# Patient Record
Sex: Female | Born: 1955 | Race: White | Hispanic: No | Marital: Single | Smoking: Never smoker
Health system: Southern US, Community
[De-identification: ages and names within clinical notes are randomized; demographics above are authoritative.]

## PROBLEM LIST (undated history)

## (undated) DIAGNOSIS — E785 Hyperlipidemia, unspecified: Secondary | ICD-10-CM

## (undated) DIAGNOSIS — Z9889 Other specified postprocedural states: Secondary | ICD-10-CM

## (undated) DIAGNOSIS — R7303 Prediabetes: Secondary | ICD-10-CM

## (undated) DIAGNOSIS — R7301 Impaired fasting glucose: Secondary | ICD-10-CM

## (undated) DIAGNOSIS — K5190 Ulcerative colitis, unspecified, without complications: Secondary | ICD-10-CM

## (undated) DIAGNOSIS — K5730 Diverticulosis of large intestine without perforation or abscess without bleeding: Secondary | ICD-10-CM

## (undated) DIAGNOSIS — R112 Nausea with vomiting, unspecified: Secondary | ICD-10-CM

## (undated) DIAGNOSIS — M1990 Unspecified osteoarthritis, unspecified site: Secondary | ICD-10-CM

## (undated) HISTORY — DX: Impaired fasting glucose: R73.01

## (undated) HISTORY — PX: CARPAL TUNNEL RELEASE: SHX101

## (undated) HISTORY — PX: BREAST ENHANCEMENT SURGERY: SHX7

## (undated) HISTORY — PX: COLONOSCOPY: SHX174

## (undated) HISTORY — DX: Hyperlipidemia, unspecified: E78.5

## (undated) HISTORY — DX: Diverticulosis of large intestine without perforation or abscess without bleeding: K57.30

---

## 1999-02-08 ENCOUNTER — Other Ambulatory Visit: Admission: RE | Admit: 1999-02-08 | Discharge: 1999-02-08 | Payer: Self-pay | Admitting: *Deleted

## 2000-02-07 ENCOUNTER — Encounter: Payer: Self-pay | Admitting: *Deleted

## 2000-02-07 ENCOUNTER — Encounter: Admission: RE | Admit: 2000-02-07 | Discharge: 2000-02-07 | Payer: Self-pay | Admitting: *Deleted

## 2000-02-14 ENCOUNTER — Other Ambulatory Visit: Admission: RE | Admit: 2000-02-14 | Discharge: 2000-02-14 | Payer: Self-pay | Admitting: *Deleted

## 2001-03-26 ENCOUNTER — Other Ambulatory Visit: Admission: RE | Admit: 2001-03-26 | Discharge: 2001-03-26 | Payer: Self-pay | Admitting: Obstetrics and Gynecology

## 2001-04-02 ENCOUNTER — Encounter: Payer: Self-pay | Admitting: Obstetrics and Gynecology

## 2001-04-02 ENCOUNTER — Encounter: Admission: RE | Admit: 2001-04-02 | Discharge: 2001-04-02 | Payer: Self-pay | Admitting: Obstetrics and Gynecology

## 2002-04-08 ENCOUNTER — Encounter: Admission: RE | Admit: 2002-04-08 | Discharge: 2002-04-08 | Payer: Self-pay | Admitting: Obstetrics and Gynecology

## 2002-04-08 ENCOUNTER — Encounter: Payer: Self-pay | Admitting: Obstetrics and Gynecology

## 2002-04-29 ENCOUNTER — Other Ambulatory Visit: Admission: RE | Admit: 2002-04-29 | Discharge: 2002-04-29 | Payer: Self-pay | Admitting: Obstetrics and Gynecology

## 2003-04-14 ENCOUNTER — Encounter: Payer: Self-pay | Admitting: Obstetrics and Gynecology

## 2003-04-14 ENCOUNTER — Encounter: Admission: RE | Admit: 2003-04-14 | Discharge: 2003-04-14 | Payer: Self-pay | Admitting: Obstetrics and Gynecology

## 2003-05-19 ENCOUNTER — Other Ambulatory Visit: Admission: RE | Admit: 2003-05-19 | Discharge: 2003-05-19 | Payer: Self-pay | Admitting: Obstetrics and Gynecology

## 2004-06-14 ENCOUNTER — Other Ambulatory Visit: Admission: RE | Admit: 2004-06-14 | Discharge: 2004-06-14 | Payer: Self-pay | Admitting: Obstetrics and Gynecology

## 2004-06-21 ENCOUNTER — Encounter: Admission: RE | Admit: 2004-06-21 | Discharge: 2004-06-21 | Payer: Self-pay | Admitting: Obstetrics and Gynecology

## 2005-07-18 ENCOUNTER — Encounter: Admission: RE | Admit: 2005-07-18 | Discharge: 2005-07-18 | Payer: Self-pay | Admitting: Obstetrics and Gynecology

## 2006-09-11 ENCOUNTER — Encounter: Admission: RE | Admit: 2006-09-11 | Discharge: 2006-09-11 | Payer: Self-pay | Admitting: Obstetrics and Gynecology

## 2007-09-17 ENCOUNTER — Encounter: Admission: RE | Admit: 2007-09-17 | Discharge: 2007-09-17 | Payer: Self-pay | Admitting: Obstetrics and Gynecology

## 2009-08-10 ENCOUNTER — Encounter: Admission: RE | Admit: 2009-08-10 | Discharge: 2009-08-10 | Payer: Self-pay | Admitting: Family Medicine

## 2010-04-21 DIAGNOSIS — K5730 Diverticulosis of large intestine without perforation or abscess without bleeding: Secondary | ICD-10-CM

## 2010-04-21 HISTORY — DX: Diverticulosis of large intestine without perforation or abscess without bleeding: K57.30

## 2010-05-17 ENCOUNTER — Encounter: Admission: RE | Admit: 2010-05-17 | Discharge: 2010-05-17 | Payer: Self-pay | Admitting: Family Medicine

## 2010-11-08 ENCOUNTER — Encounter
Admission: RE | Admit: 2010-11-08 | Discharge: 2010-11-08 | Payer: Self-pay | Source: Home / Self Care | Attending: Family Medicine | Admitting: Family Medicine

## 2010-12-12 ENCOUNTER — Encounter: Payer: Self-pay | Admitting: Family Medicine

## 2012-02-13 ENCOUNTER — Other Ambulatory Visit: Payer: Self-pay | Admitting: Family Medicine

## 2012-02-13 DIAGNOSIS — Z1231 Encounter for screening mammogram for malignant neoplasm of breast: Secondary | ICD-10-CM

## 2012-02-20 ENCOUNTER — Ambulatory Visit
Admission: RE | Admit: 2012-02-20 | Discharge: 2012-02-20 | Disposition: A | Discharge status: Home / Self Care | Payer: BC Managed Care – PPO | Source: Ambulatory Visit | Attending: Family Medicine | Admitting: Family Medicine

## 2012-02-20 DIAGNOSIS — Z1231 Encounter for screening mammogram for malignant neoplasm of breast: Secondary | ICD-10-CM

## 2013-04-29 ENCOUNTER — Other Ambulatory Visit: Payer: Self-pay

## 2013-04-29 DIAGNOSIS — Z1231 Encounter for screening mammogram for malignant neoplasm of breast: Secondary | ICD-10-CM

## 2013-05-27 ENCOUNTER — Ambulatory Visit
Admission: RE | Admit: 2013-05-27 | Discharge: 2013-05-27 | Disposition: A | Discharge status: Home / Self Care | Payer: BC Managed Care – PPO | Source: Ambulatory Visit

## 2013-05-27 DIAGNOSIS — Z1231 Encounter for screening mammogram for malignant neoplasm of breast: Secondary | ICD-10-CM

## 2013-09-30 ENCOUNTER — Encounter: Payer: Self-pay | Admitting: Family Medicine

## 2013-09-30 ENCOUNTER — Ambulatory Visit (INDEPENDENT_AMBULATORY_CARE_PROVIDER_SITE_OTHER): Payer: BC Managed Care – PPO | Admitting: Family Medicine

## 2013-09-30 ENCOUNTER — Other Ambulatory Visit: Payer: Self-pay | Admitting: Family Medicine

## 2013-09-30 VITALS — BP 145/82 | HR 72 | Temp 98.2°F | Resp 18 | Ht 63.0 in | Wt 175.0 lb

## 2013-09-30 DIAGNOSIS — E785 Hyperlipidemia, unspecified: Secondary | ICD-10-CM | POA: Insufficient documentation

## 2013-09-30 DIAGNOSIS — Z23 Encounter for immunization: Secondary | ICD-10-CM

## 2013-09-30 DIAGNOSIS — Z0000 Encounter for general adult medical examination without abnormal findings: Secondary | ICD-10-CM

## 2013-09-30 LAB — COMPREHENSIVE METABOLIC PANEL
ALT: 20 U/L (ref 0–35)
Albumin: 4.2 g/dL (ref 3.5–5.2)
Alkaline Phosphatase: 64 U/L (ref 39–117)
CO2: 28 mEq/L (ref 19–32)
Calcium: 9.3 mg/dL (ref 8.4–10.5)
Chloride: 100 mEq/L (ref 96–112)
Potassium: 4.6 mEq/L (ref 3.5–5.3)
Sodium: 135 mEq/L (ref 135–145)

## 2013-09-30 LAB — CBC WITH DIFFERENTIAL/PLATELET
Eosinophils Absolute: 0.3 10*3/uL (ref 0.0–0.7)
Hemoglobin: 14.4 g/dL (ref 12.0–15.0)
Lymphs Abs: 3.2 10*3/uL (ref 0.7–4.0)
MCH: 30.4 pg (ref 26.0–34.0)
MCHC: 34.6 g/dL (ref 30.0–36.0)
Monocytes Absolute: 0.8 10*3/uL (ref 0.1–1.0)
Monocytes Relative: 7 % (ref 3–12)
Neutrophils Relative %: 61 % (ref 43–77)
RBC: 4.74 MIL/uL (ref 3.87–5.11)

## 2013-09-30 LAB — LIPID PANEL
Cholesterol: 223 mg/dL — ABNORMAL HIGH (ref 0–200)
LDL Cholesterol: 147 mg/dL — ABNORMAL HIGH (ref 0–99)
Total CHOL/HDL Ratio: 5 Ratio
Triglycerides: 155 mg/dL — ABNORMAL HIGH (ref <150)

## 2013-09-30 NOTE — Progress Notes (Signed)
Office Note 09/30/2013  CC:  Chief Complaint  Patient presents with  . Establish Care    HPI:  Nancy Eaton is a 57 y.o. White female who is here to establish care. Patient's most recent primary MD: Dr. Prince Rome (has moved to [place]). Old records radiology in [facility] were reviewed prior to or during today's visit.  Feeling well today, no acute complaints.  She has fasted x 8hrs for her labs., needs mevacor renewed--has been out x 1 wk.  Past Medical History  Diagnosis Date  . Sigmoid diverticulosis 04/2010    "     "     "    "  . Hyperlipidemia     Lovastatin started approx 2011    Past Surgical History  Procedure Laterality Date  . Cesarean section    . Breast enhancement surgery    . Carpal tunnel release    . Colonoscopy  age 8    [facility]--recall 10 yrs    Family History  Problem Relation Age of Onset  . Cancer Mother     Brain  . Alcohol abuse Father   . COPD Brother   . ADD / ADHD Son   . Anxiety disorder Son   . Heart disease Brother     History   Social History  . Marital Status: Single    Spouse Name: N/A    Number of Children: N/A  . Years of Education: N/A   Occupational History  . Not on file.   Social History Main Topics  . Smoking status: Never Smoker   . Smokeless tobacco: Never Used  . Alcohol Use: Yes     Comment: socially  . Drug Use: No  . Sexual Activity: Not on file   Other Topics Concern  . Not on file   Social History Narrative   Divorced, has one son who lives with her.   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   Edu: HS   Orig from [place].   No Tob.  Social alc.  No drugs.   Loves music, travel.    Outpatient Encounter Prescriptions as of 09/30/2013  Medication Sig  . lovastatin (MEVACOR) 20 MG tablet Take 20 mg by mouth at bedtime.  Calcium 1500 mg qd, vit D 800 IU qd  Allergies  Allergen Reactions  . Penicillins Rash    ROS Review of Systems  Constitutional: Negative for  fever, chills, appetite change and fatigue.  HENT: Negative for congestion, dental problem, ear pain and sore throat.   Eyes: Negative for discharge, redness and visual disturbance.  Respiratory: Negative for cough, chest tightness, shortness of breath and wheezing.   Cardiovascular: Negative for chest pain, palpitations and leg swelling.  Gastrointestinal: Negative for nausea, vomiting, abdominal pain, diarrhea and blood in stool.  Genitourinary: Negative for dysuria, urgency, frequency, hematuria, flank pain and difficulty urinating.  Musculoskeletal: Negative for arthralgias, back pain, joint swelling, myalgias and neck stiffness.  Skin: Negative for pallor and rash.  Neurological: Negative for dizziness, speech difficulty, weakness and headaches.  Hematological: Negative for adenopathy. Does not bruise/bleed easily.  Psychiatric/Behavioral: Negative for confusion and sleep disturbance. The patient is not nervous/anxious.      PE; Blood pressure 145/82, pulse 72, temperature 98.2 F (36.8 C), temperature source Temporal, resp. rate 18, height 5\' 3"  (1.6 m), weight 175 lb (79.379 kg), SpO2 100.00%. Gen: Alert, well appearing.  Patient is oriented to person, place, time, and situation. AFFECT: pleasant, lucid thought  and speech. ENT: Ears: EACs clear, normal epithelium.  TMs with good light reflex and landmarks bilaterally.  Eyes: no injection, icteris, swelling, or exudate.  EOMI, PERRLA. Nose: no drainage or turbinate edema/swelling.  No injection or focal lesion.  Mouth: lips without lesion/swelling.  Oral mucosa pink and moist.  Dentition intact and without obvious caries or gingival swelling.  Oropharynx without erythema, exudate, or swelling.  Neck: supple/nontender.  No LAD, mass, or TM.  Carotid pulses 2+ bilaterally, without bruits. CV: RRR, no m/r/g.   LUNGS: CTA bilat, nonlabored resps, good aeration in all lung fields. ABD: soft, NT, ND, BS normal.  No hepatospenomegaly or mass.   No bruits. EXT: no clubbing, cyanosis, or edema.  Musculoskeletal: no joint swelling, erythema, warmth, or tenderness.  ROM of all joints intact. Skin - no sores or suspicious lesions or rashes or color changes  Pertinent labs:  None today  ASSESSMENT AND PLAN:   Health maintenance examination Reviewed age and gender appropriate health maintenance issues (prudent diet, regular exercise, health risks of tobacco and excessive alcohol, use of seatbelts, fire alarms in home, use of sunscreen).  Also reviewed age and gender appropriate health screening as well as vaccine recommendations. Flu vaccine IM today. She is UTD on breast and cervical cancer screening. Next screening colonoscopy due approx 2015 ([facility])--she is "avg risk". Fasting health panel labs done today (med center [facility]--our lab is not operational at this time).   Hyperlipidemia Stable on same dose of mevacor for years per pt. Out of med x 1 wk. Recheck FLP today. RF med as appropriate after results are in.  An After Visit Summary was printed and given to the patient.  Of note, she is waiting until after age 71 to get her zostavax.  Return in about 1 year (around 09/30/2014) for CPE (fasting).

## 2013-09-30 NOTE — Assessment & Plan Note (Addendum)
Reviewed age and gender appropriate health maintenance issues (prudent diet, regular exercise, health risks of tobacco and excessive alcohol, use of seatbelts, fire alarms in home, use of sunscreen).  Also reviewed age and gender appropriate health screening as well as vaccine recommendations. Flu vaccine IM today. She is UTD on breast and cervical cancer screening. Next screening colonoscopy due approx 2015 ([facility])--she is "avg risk". Fasting health panel labs done today (med center [facility]--our lab is not operational at this time).

## 2013-09-30 NOTE — Assessment & Plan Note (Signed)
Stable on same dose of mevacor for years per pt. Out of med x 1 wk. Recheck FLP today. RF med as appropriate after results are in.

## 2013-09-30 NOTE — Addendum Note (Signed)
Addended by: Eulah Pont on: 09/30/2013 12:20 PM   Modules accepted: Orders

## 2013-10-02 ENCOUNTER — Other Ambulatory Visit: Payer: Self-pay | Admitting: Family Medicine

## 2013-10-02 MED ORDER — LOVASTATIN 40 MG PO TABS: 40.0000 mg | ORAL_TABLET | Freq: Every day | ORAL | Status: DC

## 2014-04-21 ENCOUNTER — Ambulatory Visit (INDEPENDENT_AMBULATORY_CARE_PROVIDER_SITE_OTHER): Payer: BC Managed Care – PPO | Admitting: Family Medicine

## 2014-04-21 ENCOUNTER — Encounter: Payer: Self-pay | Admitting: Family Medicine

## 2014-04-21 ENCOUNTER — Other Ambulatory Visit: Payer: Self-pay

## 2014-04-21 VITALS — BP 139/80 | HR 72 | Temp 97.6°F | Ht 63.0 in | Wt 184.0 lb

## 2014-04-21 DIAGNOSIS — E785 Hyperlipidemia, unspecified: Secondary | ICD-10-CM

## 2014-04-21 LAB — LIPID PANEL
CHOLESTEROL: 225 mg/dL — AB (ref 0–200)
HDL: 47.2 mg/dL (ref >39.00)
LDL Cholesterol: 143 mg/dL — ABNORMAL HIGH (ref 0–99)
TRIGLYCERIDES: 174 mg/dL — AB (ref 0.0–149.0)
Total CHOL/HDL Ratio: 5
VLDL: 34.8 mg/dL (ref 0.0–40.0)

## 2014-04-21 MED ORDER — LOVASTATIN 40 MG PO TABS: 40.0000 mg | ORAL_TABLET | Freq: Every day | ORAL | Status: DC

## 2014-04-21 NOTE — Progress Notes (Signed)
OFFICE NOTE  04/21/2014  CC:  Chief Complaint  Patient presents with  . Medication Refill     HPI: Patient is a 58 y.o. Caucasian female who is here for f/u hyperlipidemia. 6 mo ago I increased her lovastatin to 40mg  daily due to LDL 147. Does pretty well with healthy diet. Keeping very busy--affects her eating schedule.  Compliant with mevacor.  No side effects. No acute complaints.   Pertinent PMH:  Past surgical, social, and family history reviewed and no changes noted since last office visit.  MEDS:  Outpatient Prescriptions Prior to Visit  Medication Sig Dispense Refill  . lovastatin (MEVACOR) 40 MG tablet Take 1 tablet (40 mg total) by mouth at bedtime.  30 tablet  3   No facility-administered medications prior to visit.    PE: Blood pressure 139/80, pulse 72, temperature 97.6 F (36.4 C), temperature source Temporal, height 5\' 3"  (1.6 m), weight 184 lb (83.462 kg), SpO2 100.00%. Gen: Alert, well appearing.  Patient is oriented to person, place, time, and situation.   IMPRESSION AND PLAN:  Hyperlipidemia: recheck FLP today. Con lovastatin 40mg  qd for now, adjust as appropriate.  FOLLOW UP: 6 mo, fasting

## 2014-04-21 NOTE — Progress Notes (Signed)
Pre visit review using our clinic review tool, if applicable. No additional management support is needed unless otherwise documented below in the visit note. 

## 2014-04-22 ENCOUNTER — Other Ambulatory Visit: Payer: Self-pay | Admitting: Family Medicine

## 2014-04-22 MED ORDER — LOVASTATIN 40 MG PO TABS: 80.0000 mg | ORAL_TABLET | Freq: Every day | ORAL | Status: DC

## 2014-05-02 ENCOUNTER — Other Ambulatory Visit: Payer: Self-pay | Admitting: Family Medicine

## 2014-07-07 ENCOUNTER — Other Ambulatory Visit: Payer: Self-pay

## 2014-07-07 DIAGNOSIS — Z1231 Encounter for screening mammogram for malignant neoplasm of breast: Secondary | ICD-10-CM

## 2014-07-14 ENCOUNTER — Ambulatory Visit
Admission: RE | Admit: 2014-07-14 | Discharge: 2014-07-14 | Disposition: A | Discharge status: Home / Self Care | Payer: BC Managed Care – PPO | Source: Ambulatory Visit

## 2014-07-14 DIAGNOSIS — Z1231 Encounter for screening mammogram for malignant neoplasm of breast: Secondary | ICD-10-CM

## 2014-10-27 ENCOUNTER — Encounter: Payer: Self-pay | Admitting: Family Medicine

## 2014-10-27 ENCOUNTER — Ambulatory Visit (INDEPENDENT_AMBULATORY_CARE_PROVIDER_SITE_OTHER): Payer: BC Managed Care – PPO | Admitting: Family Medicine

## 2014-10-27 VITALS — BP 126/84 | HR 75 | Temp 98.6°F | Resp 16 | Ht 63.0 in | Wt 184.0 lb

## 2014-10-27 DIAGNOSIS — Z23 Encounter for immunization: Secondary | ICD-10-CM

## 2014-10-27 DIAGNOSIS — Z0000 Encounter for general adult medical examination without abnormal findings: Secondary | ICD-10-CM

## 2014-10-27 LAB — COMPREHENSIVE METABOLIC PANEL
ALBUMIN: 3.9 g/dL (ref 3.5–5.2)
ALT: 22 U/L (ref 0–35)
AST: 20 U/L (ref 0–37)
Alkaline Phosphatase: 66 U/L (ref 39–117)
BILIRUBIN TOTAL: 0.5 mg/dL (ref 0.2–1.2)
BUN: 24 mg/dL — ABNORMAL HIGH (ref 6–23)
CALCIUM: 9.2 mg/dL (ref 8.4–10.5)
CO2: 23 meq/L (ref 19–32)
CREATININE: 0.8 mg/dL (ref 0.4–1.2)
Chloride: 108 mEq/L (ref 96–112)
GFR: 80.04 mL/min (ref >60.00)
Glucose, Bld: 125 mg/dL — ABNORMAL HIGH (ref 70–99)
POTASSIUM: 4.1 meq/L (ref 3.5–5.1)
SODIUM: 139 meq/L (ref 135–145)
TOTAL PROTEIN: 6.5 g/dL (ref 6.0–8.3)

## 2014-10-27 LAB — CBC WITH DIFFERENTIAL/PLATELET
BASOS PCT: 0.5 % (ref 0.0–3.0)
Basophils Absolute: 0 10*3/uL (ref 0.0–0.1)
Eosinophils Absolute: 0.3 10*3/uL (ref 0.0–0.7)
Eosinophils Relative: 3.2 % (ref 0.0–5.0)
HEMATOCRIT: 41 % (ref 36.0–46.0)
HEMOGLOBIN: 13.6 g/dL (ref 12.0–15.0)
LYMPHS ABS: 2.2 10*3/uL (ref 0.7–4.0)
Lymphocytes Relative: 24.2 % (ref 12.0–46.0)
MCHC: 33.3 g/dL (ref 30.0–36.0)
MCV: 89.3 fl (ref 78.0–100.0)
Monocytes Absolute: 0.8 10*3/uL (ref 0.1–1.0)
Monocytes Relative: 8.4 % (ref 3.0–12.0)
Neutro Abs: 5.7 10*3/uL (ref 1.4–7.7)
Neutrophils Relative %: 63.7 % (ref 43.0–77.0)
Platelets: 263 10*3/uL (ref 150.0–400.0)
RBC: 4.58 Mil/uL (ref 3.87–5.11)
RDW: 13.2 % (ref 11.5–15.5)
WBC: 9 10*3/uL (ref 4.0–10.5)

## 2014-10-27 LAB — LIPID PANEL
Cholesterol: 170 mg/dL (ref 0–200)
HDL: 42.1 mg/dL (ref >39.00)
LDL CALC: 104 mg/dL — AB (ref 0–99)
NonHDL: 127.9
Total CHOL/HDL Ratio: 4
Triglycerides: 119 mg/dL (ref 0.0–149.0)
VLDL: 23.8 mg/dL (ref 0.0–40.0)

## 2014-10-27 LAB — TSH: TSH: 2.03 u[IU]/mL (ref 0.35–4.50)

## 2014-10-27 MED ORDER — ZOSTER VACCINE LIVE 19400 UNT/0.65ML ~~LOC~~ SOLR: 0.6500 mL | Freq: Once | SUBCUTANEOUS | Status: DC

## 2014-10-27 NOTE — Patient Instructions (Signed)
You will need to contact [facility] soon to arrange for your repeat colonoscopy for colon cancer screening.

## 2014-10-27 NOTE — Assessment & Plan Note (Signed)
Reviewed age and gender appropriate health maintenance issues (prudent diet, regular exercise, health risks of tobacco and excessive alcohol, use of seatbelts, fire alarms in home, use of sunscreen).  Also reviewed age and gender appropriate health screening as well as vaccine recommendations. Flu vaccine today. Zostavax rx printed. [facility] labs drawn. Hyperlipidemia: may need to change statin to more potent one if LDL not much improved (on max dose mevacor the last 6 mo),

## 2014-10-27 NOTE — Progress Notes (Signed)
Pre visit review using our clinic review tool, if applicable. No additional management support is needed unless otherwise documented below in the visit note. 

## 2014-10-27 NOTE — Progress Notes (Signed)
Office Note 10/27/2014  CC:  Chief Complaint  Patient presents with  . Annual Exam    fasting   HPI:  Nancy Eaton is a 58 y.o. White female who is here for annual CPE. Gets GYN care through her GYN MD and is all UTD. Her chol panel did not change on mevacor 40 so 6 mo ago I increased her dose to 80mq qd.  She is fasting today. Tolerating 80mg  dose fine.  Still struggling a bit with diet choices due to fast paced life.  No exercise lately.  Working long hours.   Past Medical History  Diagnosis Date  . Sigmoid diverticulosis 04/2010    "     "     "    "  . Hyperlipidemia     Lovastatin started approx 2011    Past Surgical History  Procedure Laterality Date  . Cesarean section    . Breast enhancement surgery    . Carpal tunnel release    . Colonoscopy  age 58    [facility]--recall 10 yrs    Family History  Problem Relation Age of Onset  . Cancer Mother     Brain  . Alcohol abuse Father   . COPD Brother   . ADD / ADHD Son   . Anxiety disorder Son   . Heart disease Brother     History   Social History  . Marital Status: Single    Spouse Name: N/A    Number of Children: N/A  . Years of Education: N/A   Occupational History  . Not on file.   Social History Main Topics  . Smoking status: Never Smoker   . Smokeless tobacco: Never Used  . Alcohol Use: Yes     Comment: socially  . Drug Use: No  . Sexual Activity: Not on file   Other Topics Concern  . Not on file   Social History Narrative   Divorced, has one son who lives with her.   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   Edu: HS   Orig from [place].   No Tob.  Social alc.  No drugs.   Loves music, travel.    Outpatient Prescriptions Prior to Visit  Medication Sig Dispense Refill  . lovastatin (MEVACOR) 40 MG tablet Take 2 tablets (80 mg total) by mouth at bedtime. 30 tablet 6   No facility-administered medications prior to visit.    Allergies  Allergen  Reactions  . Penicillins Rash   ROS Review of Systems  Constitutional: Negative for fever, chills, appetite change and fatigue.  HENT: Negative for congestion, dental problem, ear pain and sore throat.   Eyes: Negative for discharge, redness and visual disturbance.  Respiratory: Negative for cough, chest tightness, shortness of breath and wheezing.   Cardiovascular: Negative for chest pain, palpitations and leg swelling.  Gastrointestinal: Negative for nausea, vomiting, abdominal pain, diarrhea and blood in stool.  Genitourinary: Negative for dysuria, urgency, frequency, hematuria, flank pain and difficulty urinating.  Musculoskeletal: Negative for myalgias, back pain, joint swelling, arthralgias and neck stiffness.  Skin: Negative for pallor and rash.  Neurological: Negative for dizziness, speech difficulty, weakness and headaches.  Hematological: Negative for adenopathy. Does not bruise/bleed easily.  Psychiatric/Behavioral: Negative for confusion and sleep disturbance. The patient is not nervous/anxious.     PE; Blood pressure 126/84, pulse 75, temperature 98.6 F (37 C), temperature source Temporal, resp. rate 16, height 5\' 3"  (1.6 m), weight 184 lb (  83.462 kg), SpO2 97 %. Gen: Alert, well appearing.  Patient is oriented to person, place, time, and situation. AFFECT: pleasant, lucid thought and speech. ENT: Ears: EACs clear, normal epithelium.  TMs with good light reflex and landmarks bilaterally.  Eyes: no injection, icteris, swelling, or exudate.  EOMI, PERRLA. Nose: no drainage or turbinate edema/swelling.  No injection or focal lesion.  Mouth: lips without lesion/swelling.  Oral mucosa pink and moist.  Dentition intact and without obvious caries or gingival swelling.  Oropharynx without erythema, exudate, or swelling.  Neck: supple/nontender.  No LAD, mass, or TM.  Carotid pulses 2+ bilaterally, without bruits. CV: RRR, no m/r/g.   LUNGS: CTA bilat, nonlabored resps, good aeration  in all lung fields. ABD: soft, NT, ND, BS normal.  No hepatospenomegaly or mass.  No bruits. EXT: no clubbing, cyanosis, or edema.  Musculoskeletal: no joint swelling, erythema, warmth, or tenderness.  ROM of all joints intact. Skin - no sores or suspicious lesions or rashes or color changes   Pertinent labs:  None today RECENT: Lab Results  Component Value Date   CHOL 225* 04/21/2014   HDL 47.20 04/21/2014   LDLCALC 143* 04/21/2014   TRIG 174.0* 04/21/2014   CHOLHDL 5 04/21/2014    ASSESSMENT AND PLAN:   Health maintenance examination Reviewed age and gender appropriate health maintenance issues (prudent diet, regular exercise, health risks of tobacco and excessive alcohol, use of seatbelts, fire alarms in home, use of sunscreen).  Also reviewed age and gender appropriate health screening as well as vaccine recommendations. Flu vaccine today. Zostavax rx printed. [facility] labs drawn. Hyperlipidemia: may need to change statin to more potent one if LDL not much improved (on max dose mevacor the last 6 mo),   An After Visit Summary was printed and given to the patient.  FOLLOW UP:  Return in about 1 year (around 10/28/2015) for annual CPE (fasting).

## 2014-10-27 NOTE — Addendum Note (Signed)
Addended by: Eulah PontALBRIGHT, Eletha Culbertson M on: 10/27/2014 10:35 AM   Modules accepted: Orders

## 2014-10-28 ENCOUNTER — Other Ambulatory Visit (INDEPENDENT_AMBULATORY_CARE_PROVIDER_SITE_OTHER): Payer: BC Managed Care – PPO

## 2014-10-28 DIAGNOSIS — R7301 Impaired fasting glucose: Secondary | ICD-10-CM

## 2014-10-28 LAB — HEMOGLOBIN A1C: HEMOGLOBIN A1C: 6.1 % (ref 4.6–6.5)

## 2014-10-30 ENCOUNTER — Other Ambulatory Visit: Payer: Self-pay | Admitting: Family Medicine

## 2014-10-30 DIAGNOSIS — R7303 Prediabetes: Secondary | ICD-10-CM

## 2014-11-17 ENCOUNTER — Other Ambulatory Visit: Payer: Self-pay

## 2014-11-17 MED ORDER — LOVASTATIN 40 MG PO TABS: 80.0000 mg | ORAL_TABLET | Freq: Every day | ORAL | Status: DC

## 2014-11-18 ENCOUNTER — Other Ambulatory Visit: Payer: Self-pay | Admitting: Family Medicine

## 2014-11-18 MED ORDER — LOVASTATIN 40 MG PO TABS: 80.0000 mg | ORAL_TABLET | Freq: Every day | ORAL | Status: DC

## 2014-11-24 ENCOUNTER — Other Ambulatory Visit: Payer: Self-pay | Admitting: Family Medicine

## 2014-11-24 MED ORDER — LOVASTATIN 40 MG PO TABS: 80.0000 mg | ORAL_TABLET | Freq: Every day | ORAL | Status: DC

## 2014-12-15 ENCOUNTER — Encounter: Payer: Self-pay | Admitting: Dietician

## 2014-12-15 ENCOUNTER — Encounter: Payer: 59 | Attending: Family Medicine | Admitting: Dietician

## 2014-12-15 VITALS — Ht 63.0 in | Wt 183.0 lb

## 2014-12-15 DIAGNOSIS — Z713 Dietary counseling and surveillance: Secondary | ICD-10-CM | POA: Insufficient documentation

## 2014-12-15 DIAGNOSIS — R7309 Other abnormal glucose: Secondary | ICD-10-CM | POA: Insufficient documentation

## 2014-12-15 DIAGNOSIS — R7303 Prediabetes: Secondary | ICD-10-CM

## 2014-12-15 NOTE — Progress Notes (Signed)
  Medical Nutrition Therapy:  Appt start time: 1115 end time:  1215.   Assessment:  Primary concerns today: "borderline DM".  Patient with pre diabetes.  HgbA1C of 6.1% 11/06/14. Cholesterol has decreased on  Mevacor.  Patient works as a Interior and spatial designerhairdresser.  Works long hours and does not always eat regularly secondary to this.  Overall diet is healthy.  She chooses a lot of whole grains, lean meats, and vegetables.  Preferred Learning Style:   No preference indicated   Learning Readiness:   Ready  MEDICATIONS: see list   DIETARY INTAKE: 24-hr recall:  B ( AM): Smoothie (fruit, yogurt, chia seeds, hemp seeds) or oatmeal with low glycemic sweetener and cranberries Snk ( AM):   L ( PM): food from home or protein bar is busy at work Snk ( PM): D (8 or 9 at times PM): meat or tofu, rice or quinoa, vegetables  Snk ( PM):  Beverages: water, flavored water, red wine  Eats out occasionally.  Usual physical activity: member of gym but has not had time to go.  Has 6 dogs. Discussed getting back to walking dogs.  Estimated energy needs: 1200-1400 calories 135-158 g carbohydrates 75-88 g protein 40-47 g fat  Progress Towards Goal(s):  In progress.   Nutritional Diagnosis:  NB-1.1 Food and nutrition-related knowledge deficit As related to Balanced meal schedule.  As evidenced by diet hx.    Intervention:  Nutrition counseling for pre diabetes and weight control.  . Aim for 30 minutes of exercise 3-5 x per week as able and time allows.  Increase as able. Great job with good nutrition! Be mindful of eating. Take care of you!  Teaching Method Utilized:  Visual Auditory Hands on  Handouts given during visit include: Carb Counting and Food Label handouts Meal Plan Card  Barriers to learning/adherence to lifestyle change: busy work schedule  Demonstrated degree of understanding via:  Teach Back   Monitoring/Evaluation:  Dietary intake, exercise, label reading, and body weight  prn.

## 2014-12-15 NOTE — Patient Instructions (Signed)
Aim for 30 minutes of exercise 3-5 x per week as able and time allows. Great job with good nutrition! Be mindful of eating. Take care of you!

## 2015-04-27 ENCOUNTER — Other Ambulatory Visit (INDEPENDENT_AMBULATORY_CARE_PROVIDER_SITE_OTHER): Payer: 59

## 2015-04-27 DIAGNOSIS — R7303 Prediabetes: Secondary | ICD-10-CM

## 2015-04-27 DIAGNOSIS — R7309 Other abnormal glucose: Secondary | ICD-10-CM | POA: Diagnosis not present

## 2015-04-27 LAB — HEMOGLOBIN A1C: HEMOGLOBIN A1C: 5.5 % (ref 4.6–6.5)

## 2015-06-29 ENCOUNTER — Telehealth: Payer: Self-pay | Admitting: *Deleted

## 2015-06-29 ENCOUNTER — Other Ambulatory Visit: Payer: Self-pay | Admitting: Family Medicine

## 2015-06-29 MED ORDER — LORAZEPAM 0.5 MG PO TABS: ORAL_TABLET | ORAL | Status: DC

## 2015-06-29 MED ORDER — TYPHOID VACCINE PO CPDR: DELAYED_RELEASE_CAPSULE | ORAL | Status: DC

## 2015-06-29 NOTE — Telephone Encounter (Signed)
Also Per Dr. Milinda Cave will send in oral typhyod vaccine. Pt advised and voiced understanding.

## 2015-06-29 NOTE — Telephone Encounter (Signed)
Pt LMOM on 06/29/15 at 1:16pm, spoke to pt and she is going to [place] and was wanting to know if she needed to be updated on any vaccines. I advised her that she may want to contact the health department to see if they have any recommendations. She also wanted to know if Dr. Milinda Cave would send something into [facility] on [facility] [facility] for anxiety. She stated that she gets really nervous flying. Please advise. Thanks.

## 2015-06-29 NOTE — Telephone Encounter (Signed)
Hep A is recommended, but getting it now won't help protect her on the upcoming trip. She needs 2 Hep A vaccines 6 months apart to be fully immunized against Hep A. Tell her to drink only bottled water and only eat at restaurants that are frequented by tourists (hep A is a food/water born virus).   I printed rx for ativan for anxiety related to flying.

## 2015-07-20 ENCOUNTER — Other Ambulatory Visit: Payer: Self-pay

## 2015-07-20 DIAGNOSIS — Z1231 Encounter for screening mammogram for malignant neoplasm of breast: Secondary | ICD-10-CM

## 2015-08-14 ENCOUNTER — Telehealth: Payer: Self-pay

## 2015-08-14 MED ORDER — LOVASTATIN 40 MG PO TABS: 80.0000 mg | ORAL_TABLET | Freq: Every day | ORAL | Status: DC

## 2015-08-14 NOTE — Telephone Encounter (Signed)
Patient needs a refill of her lovastatin.  Please [facility] ref[facility].  She has a follow up appointment.

## 2015-08-14 NOTE — Telephone Encounter (Signed)
Lovastatin RF sent as per pt request.

## 2015-09-07 ENCOUNTER — Ambulatory Visit
Admission: RE | Admit: 2015-09-07 | Discharge: 2015-09-07 | Disposition: A | Discharge status: Home / Self Care | Payer: 59 | Source: Ambulatory Visit

## 2015-09-07 DIAGNOSIS — Z1231 Encounter for screening mammogram for malignant neoplasm of breast: Secondary | ICD-10-CM

## 2016-07-21 ENCOUNTER — Ambulatory Visit (INDEPENDENT_AMBULATORY_CARE_PROVIDER_SITE_OTHER): Payer: Self-pay | Admitting: Family Medicine

## 2016-07-21 ENCOUNTER — Other Ambulatory Visit: Payer: Self-pay | Admitting: Family Medicine

## 2016-07-21 ENCOUNTER — Encounter: Payer: Self-pay | Admitting: Family Medicine

## 2016-07-21 VITALS — BP 141/94 | HR 75 | Temp 98.0°F | Resp 16 | Ht 63.0 in | Wt 183.0 lb

## 2016-07-21 DIAGNOSIS — E785 Hyperlipidemia, unspecified: Secondary | ICD-10-CM | POA: Diagnosis not present

## 2016-07-21 DIAGNOSIS — R7301 Impaired fasting glucose: Secondary | ICD-10-CM

## 2016-07-21 LAB — COMPREHENSIVE METABOLIC PANEL
ALBUMIN: 4.2 g/dL (ref 3.5–5.2)
ALT: 21 U/L (ref 0–35)
AST: 16 U/L (ref 0–37)
Alkaline Phosphatase: 59 U/L (ref 39–117)
BUN: 20 mg/dL (ref 6–23)
CALCIUM: 9 mg/dL (ref 8.4–10.5)
CHLORIDE: 105 meq/L (ref 96–112)
CO2: 28 meq/L (ref 19–32)
Creatinine, Ser: 0.76 mg/dL (ref 0.40–1.20)
GFR: 82 mL/min (ref >60.00)
Glucose, Bld: 116 mg/dL — ABNORMAL HIGH (ref 70–99)
POTASSIUM: 4.1 meq/L (ref 3.5–5.1)
SODIUM: 139 meq/L (ref 135–145)
Total Bilirubin: 0.4 mg/dL (ref 0.2–1.2)
Total Protein: 6.7 g/dL (ref 6.0–8.3)

## 2016-07-21 LAB — LIPID PANEL
CHOL/HDL RATIO: 6
CHOLESTEROL: 246 mg/dL — AB (ref 0–200)
HDL: 42 mg/dL (ref >39.00)
LDL CALC: 172 mg/dL — AB (ref 0–99)
NonHDL: 203.76
TRIGLYCERIDES: 161 mg/dL — AB (ref 0.0–149.0)
VLDL: 32.2 mg/dL (ref 0.0–40.0)

## 2016-07-21 LAB — HEMOGLOBIN A1C: Hgb A1c MFr Bld: 5.9 % (ref 4.6–6.5)

## 2016-07-21 MED ORDER — ATORVASTATIN CALCIUM 40 MG PO TABS: 40.0000 mg | ORAL_TABLET | Freq: Every day | ORAL | 6 refills | Status: DC

## 2016-07-21 MED ORDER — LOVASTATIN 40 MG PO TABS: 80.0000 mg | ORAL_TABLET | Freq: Every day | ORAL | 12 refills | Status: DC

## 2016-07-21 NOTE — Progress Notes (Signed)
OFFICE VISIT  07/21/2016   CC:  Chief Complaint  Patient presents with  . Medication Refill   HPI:    Patient is a 60 y.o.  female who presents for hyperlipidemia and IFG. Her last o/v was 10/2014. Compliant with lovastatin, eating healthy diet.  Denies side effects from med. Exercise not maximum due to some knee troubles of late--but this is getting better.  No home bp monitoring lately.  She rushed up here today and was late to her appt.  ROS: no CP, no SOB, no palpitations, no HAs.  Past Medical History:  Diagnosis Date  . Hyperlipidemia    Lovastatin started approx 2011  . IFG (impaired fasting glucose)   . Sigmoid diverticulosis 04/2010   "     "     "    "    Past Surgical History:  Procedure Laterality Date  . BREAST ENHANCEMENT SURGERY    . CARPAL TUNNEL RELEASE    . CESAREAN SECTION    . COLONOSCOPY  age 33   [facility]--recall 10 yrs   MEDS: pt does not take ativan listed below Outpatient Medications Prior to Visit  Medication Sig Dispense Refill  . lovastatin (MEVACOR) 40 MG tablet Take 2 tablets (80 mg total) by mouth at bedtime. 60 tablet 6  . typhoid (VIVOTIF) DR capsule 1 cap po qod (start taking as soon as you pick this up) 4 capsule 0  . LORazepam (ATIVAN) 0.5 MG tablet 1-2 tabs po q8h prn anxiety (Patient not taking: Reported on 07/21/2016) 10 tablet 0  . zoster vaccine live, PF, (ZOSTAVAX) 16109 UNT/0.65ML injection Inject 19,400 Units into the skin once. (Patient not taking: Reported on 12/15/2014) 1 vial 0   No facility-administered medications prior to visit.     Allergies  Allergen Reactions  . Penicillins Rash    ROS As per HPI  PE: Blood pressure (!) 141/94, pulse 75, temperature 98 F (36.7 C), temperature source Oral, resp. rate 16, height 5\' 3"  (1.6 m), weight 183 lb (83 kg), SpO2 95 %. Gen: Alert, well appearing.  Patient is oriented to person, place, time, and situation. AFFECT: pleasant, lucid thought and speech. CV: RRR, no  m/r/g.   LUNGS: CTA bilat, nonlabored resps, good aeration in all lung fields. EXT: no clubbing, cyanosis, or edema.   LABS:  Lab Results  Component Value Date   TSH 2.03 10/27/2014   Lab Results  Component Value Date   WBC 9.0 10/27/2014   HGB 13.6 10/27/2014   HCT 41.0 10/27/2014   MCV 89.3 10/27/2014   PLT 263.0 10/27/2014   Lab Results  Component Value Date   CREATININE 0.8 10/27/2014   BUN 24 (H) 10/27/2014   NA 139 10/27/2014   K 4.1 10/27/2014   CL 108 10/27/2014   CO2 23 10/27/2014   Lab Results  Component Value Date   ALT 22 10/27/2014   AST 20 10/27/2014   ALKPHOS 66 10/27/2014   BILITOT 0.5 10/27/2014   Lab Results  Component Value Date   CHOL 170 10/27/2014   Lab Results  Component Value Date   HDL 42.10 10/27/2014   Lab Results  Component Value Date   LDLCALC 104 (H) 10/27/2014   Lab Results  Component Value Date   TRIG 119.0 10/27/2014   Lab Results  Component Value Date   CHOLHDL 4 10/27/2014   Lab Results  Component Value Date   HGBA1C 5.5 04/27/2015     IMPRESSION AND PLAN:  1)  Hyperlipidemia: tolerating statin.  Due for FLP and AST/ALT.  2) IFG: last HbA1c had come down nicely.  Checking fasting glucose and A1c today. Continue TLC.  An After Visit Summary was printed and given to the patient.  FOLLOW UP: Return in about 6 months (around 01/18/2017) for annual CPE (fasting).  Signed:  Santiago BumpersPhil Ralph Brouwer, MD           07/21/2016

## 2016-07-21 NOTE — Addendum Note (Signed)
Addended by: Jeoffrey MassedMCGOWEN, PHILIP H on: 07/21/2016 08:44 AM   Modules accepted: Orders

## 2016-07-22 ENCOUNTER — Telehealth: Payer: Self-pay

## 2016-07-22 NOTE — Telephone Encounter (Signed)
Spoke to patient request pharmacy change she stated she will contact [facility]

## 2016-08-25 ENCOUNTER — Encounter (INDEPENDENT_AMBULATORY_CARE_PROVIDER_SITE_OTHER): Payer: Self-pay

## 2016-08-25 ENCOUNTER — Ambulatory Visit (INDEPENDENT_AMBULATORY_CARE_PROVIDER_SITE_OTHER): Payer: BLUE CROSS/BLUE SHIELD | Admitting: Orthopaedic Surgery

## 2016-08-25 DIAGNOSIS — M1611 Unilateral primary osteoarthritis, right hip: Secondary | ICD-10-CM

## 2016-08-25 DIAGNOSIS — M25561 Pain in right knee: Secondary | ICD-10-CM | POA: Diagnosis not present

## 2016-10-03 ENCOUNTER — Other Ambulatory Visit: Payer: Self-pay | Admitting: Internal Medicine

## 2016-10-03 ENCOUNTER — Other Ambulatory Visit: Payer: Self-pay | Admitting: Family Medicine

## 2016-10-03 DIAGNOSIS — Z1231 Encounter for screening mammogram for malignant neoplasm of breast: Secondary | ICD-10-CM

## 2016-10-31 ENCOUNTER — Ambulatory Visit (INDEPENDENT_AMBULATORY_CARE_PROVIDER_SITE_OTHER): Payer: BLUE CROSS/BLUE SHIELD

## 2016-10-31 DIAGNOSIS — Z23 Encounter for immunization: Secondary | ICD-10-CM | POA: Diagnosis not present

## 2016-11-07 ENCOUNTER — Ambulatory Visit
Admission: RE | Admit: 2016-11-07 | Discharge: 2016-11-07 | Disposition: A | Discharge status: Home / Self Care | Payer: BLUE CROSS/BLUE SHIELD | Source: Ambulatory Visit | Attending: Family Medicine | Admitting: Family Medicine

## 2016-11-07 DIAGNOSIS — Z1231 Encounter for screening mammogram for malignant neoplasm of breast: Secondary | ICD-10-CM

## 2016-11-21 DIAGNOSIS — R7301 Impaired fasting glucose: Secondary | ICD-10-CM

## 2016-11-21 HISTORY — DX: Impaired fasting glucose: R73.01

## 2017-01-02 ENCOUNTER — Ambulatory Visit (INDEPENDENT_AMBULATORY_CARE_PROVIDER_SITE_OTHER): Payer: BLUE CROSS/BLUE SHIELD | Admitting: Family Medicine

## 2017-01-02 ENCOUNTER — Encounter: Payer: Self-pay | Admitting: Family Medicine

## 2017-01-02 VITALS — BP 138/81 | HR 79 | Temp 98.3°F | Resp 16 | Ht 62.75 in | Wt 182.5 lb

## 2017-01-02 DIAGNOSIS — E7800 Pure hypercholesterolemia, unspecified: Secondary | ICD-10-CM

## 2017-01-02 DIAGNOSIS — Z0000 Encounter for general adult medical examination without abnormal findings: Secondary | ICD-10-CM | POA: Diagnosis not present

## 2017-01-02 DIAGNOSIS — Z1159 Encounter for screening for other viral diseases: Secondary | ICD-10-CM | POA: Diagnosis not present

## 2017-01-02 DIAGNOSIS — R7301 Impaired fasting glucose: Secondary | ICD-10-CM

## 2017-01-02 LAB — CBC WITH DIFFERENTIAL/PLATELET
BASOS PCT: 0.6 % (ref 0.0–3.0)
Basophils Absolute: 0.1 10*3/uL (ref 0.0–0.1)
EOS ABS: 0.2 10*3/uL (ref 0.0–0.7)
EOS PCT: 2.2 % (ref 0.0–5.0)
HCT: 43.6 % (ref 36.0–46.0)
Hemoglobin: 14.5 g/dL (ref 12.0–15.0)
Lymphocytes Relative: 22.2 % (ref 12.0–46.0)
Lymphs Abs: 2.2 10*3/uL (ref 0.7–4.0)
MCHC: 33.4 g/dL (ref 30.0–36.0)
MCV: 88.5 fl (ref 78.0–100.0)
MONO ABS: 0.8 10*3/uL (ref 0.1–1.0)
Monocytes Relative: 7.9 % (ref 3.0–12.0)
NEUTROS ABS: 6.7 10*3/uL (ref 1.4–7.7)
Neutrophils Relative %: 67.1 % (ref 43.0–77.0)
PLATELETS: 297 10*3/uL (ref 150.0–400.0)
RBC: 4.93 Mil/uL (ref 3.87–5.11)
RDW: 13.5 % (ref 11.5–15.5)
WBC: 10 10*3/uL (ref 4.0–10.5)

## 2017-01-02 LAB — COMPREHENSIVE METABOLIC PANEL
ALBUMIN: 4.4 g/dL (ref 3.5–5.2)
ALT: 15 U/L (ref 0–35)
AST: 15 U/L (ref 0–37)
Alkaline Phosphatase: 72 U/L (ref 39–117)
BUN: 15 mg/dL (ref 6–23)
CHLORIDE: 104 meq/L (ref 96–112)
CO2: 28 meq/L (ref 19–32)
CREATININE: 0.73 mg/dL (ref 0.40–1.20)
Calcium: 9.8 mg/dL (ref 8.4–10.5)
GFR: 85.77 mL/min (ref >60.00)
GLUCOSE: 109 mg/dL — AB (ref 70–99)
POTASSIUM: 4.4 meq/L (ref 3.5–5.1)
SODIUM: 139 meq/L (ref 135–145)
Total Bilirubin: 0.6 mg/dL (ref 0.2–1.2)
Total Protein: 7 g/dL (ref 6.0–8.3)

## 2017-01-02 LAB — HEMOGLOBIN A1C: Hgb A1c MFr Bld: 5.9 % (ref 4.6–6.5)

## 2017-01-02 LAB — TSH: TSH: 2.75 u[IU]/mL (ref 0.35–4.50)

## 2017-01-02 LAB — LIPID PANEL
CHOLESTEROL: 164 mg/dL (ref 0–200)
HDL: 45.6 mg/dL (ref >39.00)
LDL CALC: 83 mg/dL (ref 0–99)
NONHDL: 118.81
Total CHOL/HDL Ratio: 4
Triglycerides: 181 mg/dL — ABNORMAL HIGH (ref 0.0–149.0)
VLDL: 36.2 mg/dL (ref 0.0–40.0)

## 2017-01-02 NOTE — Progress Notes (Signed)
Office Note 01/02/2017  CC:  Chief Complaint  Patient presents with  . Annual Exam    Pt is fasting.     HPI:  Nancy Eaton is a 61 y.o. female who is here for annual health maintenance exam. GYN: UTD on mammo.  Needs to visit her GYn for pap--set up soon.  Hyperlipidemia: tolerating the switch to lipitor she made 6 mo ago. Exercise: unable to b/c of issues with knees/osteoarthritis.   Got stem cell injections in both knees last week: [facility] clinic in [place].  Eye exam fall 2017. Dental: preventative visits UTD.   Past Medical History:  Diagnosis Date  . Hyperlipidemia    Lovastatin started approx 2011  . IFG (impaired fasting glucose)   . Sigmoid diverticulosis 04/2010   "     "     "    "    Past Surgical History:  Procedure Laterality Date  . BREAST ENHANCEMENT SURGERY    . CARPAL TUNNEL RELEASE    . CESAREAN SECTION    . COLONOSCOPY  age 57   [facility]--recall 10 yrs    Family History  Problem Relation Age of Onset  . Cancer Mother     Brain  . Alcohol abuse Father   . COPD Brother   . ADD / ADHD Son   . Anxiety disorder Son   . Heart disease Brother     Social History   Social History  . Marital status: Single    Spouse name: N/A  . Number of children: N/A  . Years of education: N/A   Occupational History  . Not on file.   Social History Main Topics  . Smoking status: Never Smoker  . Smokeless tobacco: Never Used  . Alcohol use Yes     Comment: socially  . Drug use: No  . Sexual activity: Not on file   Other Topics Concern  . Not on file   Social History Narrative   Divorced, has one son who lives with her.   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   Edu: HS   Orig from [place]--[employer].   No Tob.  Social alc.  No drugs.   Loves music, travel.    Outpatient Medications Prior to Visit  Medication Sig Dispense Refill  . atorvastatin (LIPITOR) 40 MG tablet Take 1 tablet (40 mg total) by mouth daily. 30 tablet  6   No facility-administered medications prior to visit.     Allergies  Allergen Reactions  . Penicillins Rash    ROS Review of Systems  Constitutional: Negative for appetite change, chills, fatigue and fever.  HENT: Negative for congestion, dental problem, ear pain and sore throat.   Eyes: Negative for discharge, redness and visual disturbance.  Respiratory: Negative for cough, chest tightness, shortness of breath and wheezing.   Cardiovascular: Negative for chest pain, palpitations and leg swelling.  Gastrointestinal: Negative for abdominal pain, blood in stool, diarrhea, nausea and vomiting.  Genitourinary: Negative for difficulty urinating, dysuria, flank pain, frequency, hematuria and urgency.  Musculoskeletal: Positive for arthralgias (bilat knees-chronic). Negative for back pain, joint swelling, myalgias and neck stiffness.  Skin: Negative for pallor and rash.  Neurological: Negative for dizziness, speech difficulty, weakness and headaches.  Hematological: Negative for adenopathy. Does not bruise/bleed easily.  Psychiatric/Behavioral: Negative for confusion and sleep disturbance. The patient is not nervous/anxious.     PE; Blood pressure 138/81, pulse 79, temperature 98.3 F (36.8 C), temperature source Oral, resp.  rate 16, height 5' 2.75" (1.594 m), weight 182 lb 8 oz (82.8 kg), SpO2 98 %. Gen: Alert, well appearing.  Patient is oriented to person, place, time, and situation. AFFECT: pleasant, lucid thought and speech. ENT: Ears: EACs clear, normal epithelium.  TMs with good light reflex and landmarks bilaterally.  Eyes: no injection, icteris, swelling, or exudate.  EOMI, PERRLA. Nose: no drainage or turbinate edema/swelling.  No injection or focal lesion.  Mouth: lips without lesion/swelling.  Oral mucosa pink and moist.  Dentition intact and without obvious caries or gingival swelling.  Oropharynx without erythema, exudate, or swelling.  Neck: supple/nontender.  No LAD,  mass, or TM.  Carotid pulses 2+ bilaterally, without bruits. CV: RRR, no m/r/g.   LUNGS: CTA bilat, nonlabored resps, good aeration in all lung fields. ABD: soft, NT, ND, BS normal.  No hepatospenomegaly or mass.  No bruits. EXT: no clubbing, cyanosis, or edema.  Musculoskeletal: no joint swelling, erythema, warmth, or tenderness.  ROM of all joints intact. Skin - no sores or suspicious lesions or rashes or color changes  Pertinent labs:  Lab Results  Component Value Date   TSH 2.03 10/27/2014   Lab Results  Component Value Date   WBC 9.0 10/27/2014   HGB 13.6 10/27/2014   HCT 41.0 10/27/2014   MCV 89.3 10/27/2014   PLT 263.0 10/27/2014   Lab Results  Component Value Date   CREATININE 0.76 07/21/2016   BUN 20 07/21/2016   NA 139 07/21/2016   K 4.1 07/21/2016   CL 105 07/21/2016   CO2 28 07/21/2016   Lab Results  Component Value Date   ALT 21 07/21/2016   AST 16 07/21/2016   ALKPHOS 59 07/21/2016   BILITOT 0.4 07/21/2016   Lab Results  Component Value Date   CHOL 246 (H) 07/21/2016   Lab Results  Component Value Date   HDL 42.00 07/21/2016   Lab Results  Component Value Date   LDLCALC 172 (H) 07/21/2016   Lab Results  Component Value Date   TRIG 161.0 (H) 07/21/2016   Lab Results  Component Value Date   CHOLHDL 6 07/21/2016   Lab Results  Component Value Date   HGBA1C 5.9 07/21/2016    ASSESSMENT AND PLAN:   Health maintenance exam. Reviewed age and gender appropriate health maintenance issues (prudent diet, regular exercise, health risks of tobacco and excessive alcohol, use of seatbelts, fire alarms in home, use of sunscreen).  Also reviewed age and gender appropriate health screening as well as vaccine recommendations. Vaccines UTD Fasting HP drawn today.  Additionally, pt desired Hep C screening, plus I did a hbA1c to f/u her IFG. Cerv ca screening: pt to make f/u appt with her GYN. Breast ca screening: mammo UTD/normal. Colon cancer screening:  next colonoscopy due; pt aware and will make f/u appt with [facility] for this.  An After Visit Summary was printed and given to the patient.  FOLLOW UP:  Return in about 6 months (around 07/02/2017) for routine chronic illness f/u.  Signed:  Santiago BumpersPhil McGowen, MD           01/02/2017

## 2017-01-02 NOTE — Progress Notes (Signed)
Pre visit review using our clinic review tool, if applicable. No additional management support is needed unless otherwise documented below in the visit note. 

## 2017-01-03 ENCOUNTER — Encounter: Payer: Self-pay | Admitting: *Deleted

## 2017-01-03 ENCOUNTER — Encounter: Payer: Self-pay | Admitting: Family Medicine

## 2017-01-03 LAB — HEPATITIS C ANTIBODY: HCV Ab: NEGATIVE

## 2017-06-14 ENCOUNTER — Other Ambulatory Visit: Payer: Self-pay | Admitting: Family Medicine

## 2017-06-14 NOTE — Telephone Encounter (Signed)
[facility] [address]  RF request for atorvastatin LOV: 01/02/17 Next ov: None Last written: 07/21/16 #30 w/ 6RF

## 2017-07-10 ENCOUNTER — Other Ambulatory Visit: Payer: Self-pay | Admitting: *Deleted

## 2017-07-10 MED ORDER — ATORVASTATIN CALCIUM 40 MG PO TABS: 40.0000 mg | ORAL_TABLET | Freq: Every day | ORAL | 1 refills | Status: DC

## 2017-07-10 NOTE — Telephone Encounter (Signed)
[facility] requesting Rx for 90 day supply.

## 2017-11-10 ENCOUNTER — Ambulatory Visit: Payer: BLUE CROSS/BLUE SHIELD | Admitting: Family Medicine

## 2017-11-10 ENCOUNTER — Encounter: Payer: Self-pay | Admitting: Family Medicine

## 2017-11-10 VITALS — BP 132/84 | HR 70 | Temp 98.0°F | Wt 187.1 lb

## 2017-11-10 DIAGNOSIS — M5431 Sciatica, right side: Secondary | ICD-10-CM

## 2017-11-10 DIAGNOSIS — M25551 Pain in right hip: Secondary | ICD-10-CM | POA: Diagnosis not present

## 2017-11-10 DIAGNOSIS — M7061 Trochanteric bursitis, right hip: Secondary | ICD-10-CM

## 2017-11-10 DIAGNOSIS — M7601 Gluteal tendinitis, right hip: Secondary | ICD-10-CM

## 2017-11-10 DIAGNOSIS — M79604 Pain in right leg: Secondary | ICD-10-CM | POA: Diagnosis not present

## 2017-11-10 MED ORDER — PREDNISONE 20 MG PO TABS: ORAL_TABLET | ORAL | 0 refills | Status: DC

## 2017-11-10 NOTE — Patient Instructions (Signed)
Do exercises that I gave you today.  After you finish the 5 days of prednisone, take 2 aleve twice per day with food for 5 days, then stop.

## 2017-11-10 NOTE — Progress Notes (Signed)
OFFICE VISIT  11/10/2017   CC:  Chief Complaint  Patient presents with  . Hip Pain    right sided hip pain that radiates to the back and leg   HPI:    Patient is a 61 y.o. Caucasian female who presents for right hip pain. A couple weeks ago, started having right gluteal pain that radiates down R leg into ankle area intermittently when she sits or lies on R side.  Some mild tingly/numbness in R leg--same distribution as radiating pain.  No leg weakness.  Worse the longer she stands up.  No b/b changes.  No preceding trauma, no preceding strain recalled. Has improved a little over the last 2 weeks.   Has been taking ibup and aleve only PRN, usually hs.  Celebrex seemed to help best recently. Has intermittent LB pain in the past, sometimes wears LB brace/band.    Past Medical History:  Diagnosis Date  . Hyperlipidemia    Lovastatin started approx 2011  . IFG (impaired fasting glucose) 2018   HbA1c 5.9% (stable)  . Sigmoid diverticulosis 04/2010   "     "     "    "    Past Surgical History:  Procedure Laterality Date  . BREAST ENHANCEMENT SURGERY    . CARPAL TUNNEL RELEASE    . CESAREAN SECTION    . COLONOSCOPY  age 61   [facility]--recall 10 yrs    Outpatient Medications Prior to Visit  Medication Sig Dispense Refill  . atorvastatin (LIPITOR) 40 MG tablet Take 1 tablet (40 mg total) by mouth daily. 90 tablet 1   No facility-administered medications prior to visit.     Allergies  Allergen Reactions  . Penicillins Rash    ROS As per HPI  PE: Blood pressure 132/84, pulse 70, temperature 98 F (36.7 C), temperature source Other (Comment), weight 187 lb 1.9 oz (84.9 kg), SpO2 96 %.  Pt examined with Judie GrieveSuzanne Williams, nurse, as chaperone.  Gen: Alert, well appearing.  Patient is oriented to person, place, time, and situation. AFFECT: pleasant, lucid thought and speech. Back: mild TTP in R lumbar soft tissues.  No midline TTP. No tenderness over R ischial tuberosity.   No posterolateral glut tenderness, extending along/over troch bursa region and further down lateral thigh to about mid thigh. LE strength 5/5 prox/dist bilat. DTRs: 2+ patella bilat, trace achilles bilat. Sitting SLR neg on L, + on R beginning at about 45 deg--elicits only some tingling down R leg.    LABS:    Chemistry      Component Value Date/Time   NA 139 01/02/2017 0854   K 4.4 01/02/2017 0854   CL 104 01/02/2017 0854   CO2 28 01/02/2017 0854   BUN 15 01/02/2017 0854   CREATININE 0.73 01/02/2017 0854   CREATININE 0.81 09/30/2013 1358      Component Value Date/Time   CALCIUM 9.8 01/02/2017 0854   ALKPHOS 72 01/02/2017 0854   AST 15 01/02/2017 0854   ALT 15 01/02/2017 0854   BILITOT 0.6 01/02/2017 0854     Lab Results  Component Value Date   HGBA1C 5.9 01/02/2017    IMPRESSION AND PLAN:  Right sided sciatica suspected, but she also has some physical exam findings suspicious for R glut tendonitis + R troch bursitis. Plan: prednisone 40 mg qd x 5d. After finished with prednisone, take aleve 440mg  bid x 5d with food. Home stretches discussed, handout given. If not improving in 12-14d, next step would  be LB and R hip x-rays as well as PT referral.  An After Visit Summary was printed and given to the patient.  FOLLOW UP: Return if symptoms worsen or fail to improve in 12-14 days.  Signed:  Santiago BumpersPhil Chyane Greer, MD           11/10/2017

## 2017-11-29 ENCOUNTER — Other Ambulatory Visit: Payer: Self-pay | Admitting: Physical Medicine & Rehabilitation

## 2017-11-29 ENCOUNTER — Ambulatory Visit
Admission: RE | Admit: 2017-11-29 | Discharge: 2017-11-29 | Disposition: A | Discharge status: Home / Self Care | Payer: BLUE CROSS/BLUE SHIELD | Source: Ambulatory Visit | Attending: Physical Medicine & Rehabilitation | Admitting: Physical Medicine & Rehabilitation

## 2017-11-29 DIAGNOSIS — M25552 Pain in left hip: Principal | ICD-10-CM

## 2017-11-29 DIAGNOSIS — M25551 Pain in right hip: Secondary | ICD-10-CM

## 2017-12-15 ENCOUNTER — Telehealth: Payer: Self-pay | Admitting: Family Medicine

## 2017-12-15 NOTE — Telephone Encounter (Signed)
Copied from CRM [id number]. Topic: Referral - Request >> Dec 15, 2017  4:54 PM Floria RavelingStovall, Shana A wrote: Reason for CRM: pt would like to know if Dr Milinda CaveMcGowen would put in an order for a MRI of her spine?

## 2017-12-18 NOTE — Telephone Encounter (Signed)
SW pt and she stated that her back started to feel better over the weekend and seems to have resolved.

## 2017-12-18 NOTE — Telephone Encounter (Signed)
Please advise. Thanks.  

## 2017-12-18 NOTE — Telephone Encounter (Signed)
Needs o/v to get rechecked and discuss appropriate plan.

## 2017-12-18 NOTE — Telephone Encounter (Deleted)
Copied from CRM #[id number]. Topic: Referral - Request °>> Dec 15, 2017  4:54 PM Stovall, Shana A wrote: °Reason for CRM: pt would like to know if Dr McGowen would put in an order for a MRI of her spine?   ° °

## 2017-12-20 ENCOUNTER — Other Ambulatory Visit: Payer: Self-pay | Admitting: Family Medicine

## 2017-12-20 DIAGNOSIS — Z1231 Encounter for screening mammogram for malignant neoplasm of breast: Secondary | ICD-10-CM

## 2017-12-20 NOTE — Telephone Encounter (Signed)
SW pt and advised her that Dr. Milinda CaveMcGowen recommends she come in for o/v to get rechecked and discuss appropriate plan. Pt voiced understanding. Apt made for 12/21/17 at 8:00am.

## 2017-12-20 NOTE — Telephone Encounter (Signed)
Patient called back and said she was feeling better and is starting to have the pain again in her back and [facility]. Pt would like a call back [phone]

## 2017-12-21 ENCOUNTER — Ambulatory Visit: Payer: BLUE CROSS/BLUE SHIELD | Admitting: Family Medicine

## 2017-12-21 ENCOUNTER — Encounter: Payer: Self-pay | Admitting: Family Medicine

## 2017-12-21 ENCOUNTER — Other Ambulatory Visit: Payer: Self-pay | Admitting: Family Medicine

## 2017-12-21 VITALS — BP 145/79 | HR 72 | Temp 98.3°F | Resp 16 | Ht 62.75 in | Wt 187.8 lb

## 2017-12-21 DIAGNOSIS — M5416 Radiculopathy, lumbar region: Secondary | ICD-10-CM

## 2017-12-21 DIAGNOSIS — M5431 Sciatica, right side: Secondary | ICD-10-CM | POA: Diagnosis not present

## 2017-12-21 DIAGNOSIS — M7918 Myalgia, other site: Secondary | ICD-10-CM

## 2017-12-21 MED ORDER — GABAPENTIN 100 MG PO CAPS: ORAL_CAPSULE | ORAL | 1 refills | Status: DC

## 2017-12-21 NOTE — Patient Instructions (Signed)
Take one gabapentin three times a day x 3d, then increase to 2 tabs three times a day and continue this dosing daily.

## 2017-12-21 NOTE — Progress Notes (Signed)
OFFICE VISIT  12/21/2017   CC:  Chief Complaint  Patient presents with  . Follow-up    back pain   HPI:    Patient is a 62 y.o. Caucasian female who presents for f/u R glut/hip/leg pain. I first saw her for this about 6 wks ago (total duration of sx's at this time is about 2 mo):  I felt like she had sciatica +/- a component of gluteal tendonitis +/- trochanteric bursitis. I treated her with home exercises/stretches, prednisone x 5d course.  After prednisone she was instructed to take aleve bid x 5d.  Has has not improved any: still taking ibup, trying ice/heat, going to chiropractor--no x-rays have been done. Intensity does wax/wane depending on activity level.   Describes pain starting in mid glut region and it radiates all the way down R leg to her foot. Has some intermittent tingling and numbness in lateral aspect of R leg into big toe. Ibup helps.  Sitting and lying down on R glut makes it worse.   No low back pain.   Past Medical History:  Diagnosis Date  . Hyperlipidemia    Lovastatin started approx 2011  . IFG (impaired fasting glucose) 2018   HbA1c 5.9% (stable)  . Sigmoid diverticulosis 04/2010   "     "     "    "    Past Surgical History:  Procedure Laterality Date  . BREAST ENHANCEMENT SURGERY    . CARPAL TUNNEL RELEASE    . CESAREAN SECTION    . COLONOSCOPY  age 56   [facility]--recall 10 yrs    Outpatient Medications Prior to Visit  Medication Sig Dispense Refill  . atorvastatin (LIPITOR) 40 MG tablet Take 1 tablet (40 mg total) by mouth daily. 90 tablet 1  . predniSONE (DELTASONE) 20 MG tablet 2 tabs po qd x 5d (Patient not taking: Reported on 12/21/2017) 10 tablet 0   No facility-administered medications prior to visit.     Allergies  Allergen Reactions  . Penicillins Rash    ROS As per HPI  PE: Blood pressure (!) 145/79, pulse 72, temperature 98.3 F (36.8 C), temperature source Oral, resp. rate 16, height 5' 2.75" (1.594 m), weight 187 lb 12  oz (85.2 kg), SpO2 95 %. Body mass index is 33.52 kg/m.  Pt examined with Pryor Ochoa, CMA, as chaperone.  Gen: Alert, well appearing.  Patient is oriented to person, place, time, and situation. AFFECT: pleasant, lucid thought and speech. Low back: ROM intact w/out any pain. No TTP in lumbar or SI areas. No TTP over ischial tuberosity region on R. Palpation over glut and greater trochanter region on R elicits a vague discomfort/numbness tingling in these regions, but not tenderness exactly.  Pt has trouble characterizing the sensation.   Leg strength 5/5 prox/dist bilat. Sitting SLR neg bilat. No SI joint pain with ROM testing. Patellar DTRs 1+ bilat, achilles DTRs trace bilat.  LABS:  None  X-ray of hips/pelvis 11/29/17:  FINDINGS: Mild degenerative changes of the hip joints are noted bilaterally. No acute fracture or dislocation is noted. No soft tissue changes are seen. Degenerative changes in the lumbar spine are noted as well.  IMPRESSION: Degenerative change without acute abnormality.  IMPRESSION AND PLAN:  Persistent R glut pain radiating down R leg.   I am still thinking this is sciatica vs gluteal tendonitis w/possible component of trochanteric bursitis. Lower suspicion of lumbar spinal nerve impingement. Will ask sports med to see her for further  eval/mgmt. Start gabapentin 100mg .  Instructions: Take one gabapentin three times a day x 3d, then increase to 2 tabs three times a day and continue this dosing daily.  Ok to continue ibuprofen since this is helping and she is tolerating it well. Dedicated plain films L spine ordered today.  An After Visit Summary was printed and given to the patient.  FOLLOW UP: Return if symptoms worsen or fail to improve.  Signed:  Santiago BumpersPhil Jan Walters, MD           12/21/2017

## 2017-12-22 ENCOUNTER — Ambulatory Visit
Admission: RE | Admit: 2017-12-22 | Discharge: 2017-12-22 | Disposition: A | Discharge status: Home / Self Care | Payer: BLUE CROSS/BLUE SHIELD | Source: Ambulatory Visit | Attending: Family Medicine | Admitting: Family Medicine

## 2017-12-22 DIAGNOSIS — M7918 Myalgia, other site: Secondary | ICD-10-CM

## 2017-12-22 DIAGNOSIS — M5416 Radiculopathy, lumbar region: Secondary | ICD-10-CM

## 2017-12-25 ENCOUNTER — Ambulatory Visit: Payer: BLUE CROSS/BLUE SHIELD | Admitting: Family Medicine

## 2017-12-25 ENCOUNTER — Encounter: Payer: Self-pay | Admitting: *Deleted

## 2017-12-25 ENCOUNTER — Encounter: Payer: Self-pay | Admitting: Family Medicine

## 2017-12-25 VITALS — BP 124/78 | HR 80 | Temp 98.4°F | Ht 62.75 in | Wt 190.0 lb

## 2017-12-25 DIAGNOSIS — M5441 Lumbago with sciatica, right side: Secondary | ICD-10-CM | POA: Diagnosis not present

## 2017-12-25 NOTE — Progress Notes (Signed)
Nancy Eaton - 62 y.o. female MRN [id number]  Date of birth: 11/22/1955  SUBJECTIVE:  Including CC & ROS.  Chief Complaint  Patient presents with  . Right gluteal pain    Nancy Eaton is a 62 y.o. female that is presenting with right gluteal pain. Pain has been ongoing for two months. Pain is located in the posterior aspect and radiates down her right leg. Pain is described as burning ache. Numbness is present. She has been taking motrin and gabapentin with some improvement. She works as a Producer, television/film/video, is on her feet a lot. Denies injury or prior surgeries. Pain is worse with prolonged sitting. Pain is mild to moderate. Denies any prior surgery or injury. Doesn't remember an inciting event. Pain has been staying the same. Pain is worse with lying on her back.    Independent review of the lumbar xray on 2/1 shows facet arthritis.    Review of Systems  Constitutional: Negative for fever.  HENT: Negative for ear pain.   Respiratory: Negative for shortness of breath.   Cardiovascular: Negative for chest pain.  Gastrointestinal: Negative for abdominal pain.  Musculoskeletal: Positive for back pain.  Skin: Negative for color change.  Neurological: Negative for weakness.  Hematological: Negative for adenopathy.  Psychiatric/Behavioral: Negative for agitation.    HISTORY: Past Medical, Surgical, Social, and Family History Reviewed & Updated per EMR.   Pertinent Historical Findings include:  Past Medical History:  Diagnosis Date  . Hyperlipidemia    Lovastatin started approx 2011  . IFG (impaired fasting glucose) 2018   HbA1c 5.9% (stable)  . Sigmoid diverticulosis 04/2010   "     "     "    "    Past Surgical History:  Procedure Laterality Date  . BREAST ENHANCEMENT SURGERY    . CARPAL TUNNEL RELEASE    . CESAREAN SECTION    . COLONOSCOPY  age 14   [facility]--recall 10 yrs    Allergies  Allergen Reactions  . Penicillins Rash    Family History  Problem Relation Age of  Onset  . Cancer Mother        Brain  . Alcohol abuse Father   . COPD Brother   . ADD / ADHD Son   . Anxiety disorder Son   . Heart disease Brother      Social History   Socioeconomic History  . Marital status: Single    Spouse name: Not on file  . Number of children: Not on file  . Years of education: Not on file  . Highest education level: Not on file  Social Needs  . Financial resource strain: Not on file  . Food insecurity - worry: Not on file  . Food insecurity - inability: Not on file  . Transportation needs - medical: Not on file  . Transportation needs - non-medical: Not on file  Occupational History  . Not on file  Tobacco Use  . Smoking status: Never Smoker  . Smokeless tobacco: Never Used  Substance and Sexual Activity  . Alcohol use: Yes    Comment: socially  . Drug use: No  . Sexual activity: Not on file  Other Topics Concern  . Not on file  Social History Narrative   Divorced, has one son who lives with her.   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   Edu: HS   Orig from [place]--[employer].   No Tob.  Social alc.  No drugs.  Loves music, travel.     PHYSICAL EXAM:  VS: BP 124/78 (BP Location: Left Arm, Patient Position: Sitting, Cuff Size: Normal)   Pulse 80   Temp 98.4 F (36.9 C) (Oral)   Ht 5' 2.75" (1.594 m)   Wt 190 lb (86.2 kg)   SpO2 97%   BMI 33.93 kg/m  Physical Exam Gen: NAD, alert, cooperative with exam, well-appearing ENT: normal lips, normal nasal mucosa,  Eye: normal EOM, normal conjunctiva and lids CV:  no edema, +2 pedal pulses   Resp: no accessory muscle use, non-labored,  Skin: no rashes, no areas of induration  Neuro: normal tone, normal sensation to touch Psych:  normal insight, alert and oriented MSK:  Back: TTP of the left and right lumbar paraspinal muscles.  TTP of the right SI joint  No TTP of the GT  Normal IR and ER  Normal strength to resistance with hip flexion  Normal knee flexion and  extension  Normal plantar and dorsal flexion  Negative SLR b/l  Normal gait.  FABER on right is limited compared to left  Normal FADIR  Significant weakness with hip abduction on right  Neurovascularly intact.      ASSESSMENT & PLAN:   Acute right-sided low back pain with right-sided sciatica Pain seems to be sciatica in nature but with negative SLR. Likely has biomechanical factors playing component with weakness elicited on exam. May have a component of facet related pain.  - counseled on PT and placed referral  - continue gabapentin  - counseled on HEP  - f/u in 4-6 weeks, if no improvement then consider MRI for possible epidural

## 2017-12-25 NOTE — Patient Instructions (Signed)
Please try the exercises  Please try to attend one or two sessions of physical therapy   Please follow up with me in 4-6 weeks if your symptoms are not improved.

## 2017-12-25 NOTE — Assessment & Plan Note (Addendum)
Pain seems to be sciatica in nature but with negative SLR. Likely has biomechanical factors playing component with weakness elicited on exam. May have a component of facet related pain.  - counseled on PT and placed referral  - continue gabapentin  - counseled on HEP  - f/u in 4-6 weeks, if no improvement then consider MRI for possible epidural

## 2018-01-08 ENCOUNTER — Ambulatory Visit
Admission: RE | Admit: 2018-01-08 | Discharge: 2018-01-08 | Disposition: A | Discharge status: Home / Self Care | Payer: BLUE CROSS/BLUE SHIELD | Source: Ambulatory Visit | Attending: Family Medicine | Admitting: Family Medicine

## 2018-01-08 DIAGNOSIS — Z1231 Encounter for screening mammogram for malignant neoplasm of breast: Secondary | ICD-10-CM

## 2018-01-09 ENCOUNTER — Encounter: Payer: Self-pay | Admitting: Family Medicine

## 2018-03-13 ENCOUNTER — Other Ambulatory Visit: Payer: Self-pay | Admitting: Family Medicine

## 2018-03-19 ENCOUNTER — Ambulatory Visit: Payer: BLUE CROSS/BLUE SHIELD | Admitting: Family Medicine

## 2018-03-19 ENCOUNTER — Encounter: Payer: Self-pay | Admitting: Family Medicine

## 2018-03-19 VITALS — BP 128/81 | HR 73 | Temp 98.3°F | Resp 16 | Ht 62.75 in | Wt 189.1 lb

## 2018-03-19 DIAGNOSIS — R7301 Impaired fasting glucose: Secondary | ICD-10-CM | POA: Diagnosis not present

## 2018-03-19 DIAGNOSIS — E669 Obesity, unspecified: Secondary | ICD-10-CM | POA: Diagnosis not present

## 2018-03-19 DIAGNOSIS — E7800 Pure hypercholesterolemia, unspecified: Secondary | ICD-10-CM

## 2018-03-19 LAB — LIPID PANEL
CHOLESTEROL: 212 mg/dL — AB (ref 0–200)
HDL: 48.1 mg/dL (ref >39.00)
LDL CALC: 130 mg/dL — AB (ref 0–99)
NonHDL: 163.43
TRIGLYCERIDES: 169 mg/dL — AB (ref 0.0–149.0)
Total CHOL/HDL Ratio: 4
VLDL: 33.8 mg/dL (ref 0.0–40.0)

## 2018-03-19 LAB — COMPREHENSIVE METABOLIC PANEL
ALBUMIN: 4.3 g/dL (ref 3.5–5.2)
ALT: 20 U/L (ref 0–35)
AST: 16 U/L (ref 0–37)
Alkaline Phosphatase: 66 U/L (ref 39–117)
BUN: 21 mg/dL (ref 6–23)
CALCIUM: 9.5 mg/dL (ref 8.4–10.5)
CHLORIDE: 104 meq/L (ref 96–112)
CO2: 29 mEq/L (ref 19–32)
CREATININE: 0.69 mg/dL (ref 0.40–1.20)
GFR: 91.18 mL/min (ref >60.00)
Glucose, Bld: 97 mg/dL (ref 70–99)
Potassium: 4.7 mEq/L (ref 3.5–5.1)
SODIUM: 139 meq/L (ref 135–145)
Total Bilirubin: 0.5 mg/dL (ref 0.2–1.2)
Total Protein: 6.9 g/dL (ref 6.0–8.3)

## 2018-03-19 LAB — HEMOGLOBIN A1C: HEMOGLOBIN A1C: 5.9 % (ref 4.6–6.5)

## 2018-03-19 NOTE — Progress Notes (Signed)
OFFICE VISIT  03/19/2018   CC:  Chief Complaint  Patient presents with  . Follow-up    RCI, pt is fasting.     HPI:    Patient is a 62 y.o. Caucasian female who presents for f/u hypercholesterolemia and IFG.  HLD: compliant with atorva, no side effects. Sciatica not limiting her anymore now.  Will be getting into exercising again soon. Diet: avoids fatty and fried foods.  Eating fresh veggies, grains, avoiding white foods some.    Past Medical History:  Diagnosis Date  . Hyperlipidemia    Lovastatin started approx 2011  . IFG (impaired fasting glucose) 2018   HbA1c 5.9% (stable)  . Sigmoid diverticulosis 04/2010   "     "     "    "    Past Surgical History:  Procedure Laterality Date  . BREAST ENHANCEMENT SURGERY     As of mammogram 12/2017--rupture of implants stable.  Marland Kitchen CARPAL TUNNEL RELEASE    . CESAREAN SECTION    . COLONOSCOPY  age 35   [facility]--recall 10 yrs    Outpatient Medications Prior to Visit  Medication Sig Dispense Refill  . atorvastatin (LIPITOR) 40 MG tablet Take 1 tablet (40 mg total) by mouth daily. 90 tablet 1  . atorvastatin (LIPITOR) 40 MG tablet TAKE 1 TABLET BY MOUTH ONCE DAILY (Patient not taking: Reported on 03/19/2018) 90 tablet 1  . gabapentin (NEURONTIN) 100 MG capsule 1-2 tabs po tid (Patient not taking: Reported on 03/19/2018) 30 capsule 1   No facility-administered medications prior to visit.     Allergies  Allergen Reactions  . Penicillins Rash    ROS As per HPI  PE: Blood pressure 128/81, pulse 73, temperature 98.3 F (36.8 C), temperature source Oral, resp. rate 16, height 5' 2.75" (1.594 m), weight 189 lb 2 oz (85.8 kg), SpO2 96 %. Body mass index is 33.77 kg/m.  Gen: Alert, well appearing.  Patient is oriented to person, place, time, and situation. AFFECT: pleasant, lucid thought and speech. WUJ:WJXB: no injection, icteris, swelling, or exudate.  EOMI, PERRLA. Mouth: lips without lesion/swelling.  Oral mucosa pink and  moist. Oropharynx without erythema, exudate, or swelling.  CV: RRR, no m/r/g.   LUNGS: CTA bilat, nonlabored resps, good aeration in all lung fields. EXT: no clubbing, cyanosis, or edema.    LABS:    Chemistry      Component Value Date/Time   NA 139 01/02/2017 0854   K 4.4 01/02/2017 0854   CL 104 01/02/2017 0854   CO2 28 01/02/2017 0854   BUN 15 01/02/2017 0854   CREATININE 0.73 01/02/2017 0854   CREATININE 0.81 09/30/2013 1358      Component Value Date/Time   CALCIUM 9.8 01/02/2017 0854   ALKPHOS 72 01/02/2017 0854   AST 15 01/02/2017 0854   ALT 15 01/02/2017 0854   BILITOT 0.6 01/02/2017 0854     Lab Results  Component Value Date   WBC 10.0 01/02/2017   HGB 14.5 01/02/2017   HCT 43.6 01/02/2017   MCV 88.5 01/02/2017   PLT 297.0 01/02/2017   Lab Results  Component Value Date   TSH 2.75 01/02/2017   Lab Results  Component Value Date   HGBA1C 5.9 01/02/2017   Lab Results  Component Value Date   CHOL 164 01/02/2017   HDL 45.60 01/02/2017   LDLCALC 83 01/02/2017   TRIG 181.0 (H) 01/02/2017   CHOLHDL 4 01/02/2017     IMPRESSION AND PLAN:  1)  Hypercholesterolemia: tolerating statin. Repeat FLP today. Hepatic panel today.  2) IFG: working on diet and exercise. A1c, lytes/cr.  3) Obesity: encouraged pt to get more strict with diet and work more vigorously on exercise in order to lose wt.  An After Visit Summary was printed and given to the patient.  FOLLOW UP: Return in about 6 months (around 09/18/2018) for annual CPE (fasting).  Signed:  Santiago Bumpers, MD           03/19/2018

## 2019-02-22 ENCOUNTER — Other Ambulatory Visit: Payer: Self-pay | Admitting: Family Medicine

## 2019-02-25 ENCOUNTER — Telehealth: Payer: Self-pay | Admitting: Family Medicine

## 2019-02-25 MED ORDER — ATORVASTATIN CALCIUM 40 MG PO TABS: 40.0000 mg | ORAL_TABLET | Freq: Every day | ORAL | 0 refills | Status: AC

## 2019-02-25 NOTE — Telephone Encounter (Signed)
Copied from CRM [id number]. Topic: General - Inquiry >> Feb 25, 2019 11:12 AM Lynne Logan D wrote: Reason for CRM: Pt now has new insurance and had to find another provider She is unable to see them until June. She would like to know if there is any way to refill her atorvastatin (LIPITOR) 40 MG tablet until her appointment. Please advise.  [facility] [address] [place], [place] - [phone] (Phone) [phone] (Fax)   We have not filled this medication since 07/10/2017 # 90 x 1 rf.  Last lab results state patient was to up her atorvastatin 40mg  to 80 mg but patient was going to call when she was out of 40 mg because she just picked up RX.    I contacted pharmacy.  It does look like someone sent in from our office another RX for atorvastatin 40 MG  03/13/2018 # 90 x 1 RF.  Last RF picked up 06/21/2018.  Please advise?

## 2019-02-25 NOTE — Telephone Encounter (Signed)
OK, 90 d supply of atorva 40mg  eRx'd.

## 2019-12-23 DIAGNOSIS — M25561 Pain in right knee: Secondary | ICD-10-CM

## 2019-12-23 HISTORY — DX: Pain in right knee: M25.561

## 2019-12-30 ENCOUNTER — Ambulatory Visit (INDEPENDENT_AMBULATORY_CARE_PROVIDER_SITE_OTHER): Payer: Medicare Other

## 2019-12-30 ENCOUNTER — Other Ambulatory Visit: Payer: Self-pay

## 2019-12-30 ENCOUNTER — Ambulatory Visit (INDEPENDENT_AMBULATORY_CARE_PROVIDER_SITE_OTHER): Payer: Medicare Other | Admitting: Orthopaedic Surgery

## 2019-12-30 DIAGNOSIS — M25561 Pain in right knee: Secondary | ICD-10-CM

## 2019-12-30 DIAGNOSIS — G8929 Other chronic pain: Secondary | ICD-10-CM

## 2019-12-30 DIAGNOSIS — M1711 Unilateral primary osteoarthritis, right knee: Secondary | ICD-10-CM

## 2019-12-30 MED ORDER — LIDOCAINE HCL 1 % IJ SOLN
3.0000 mL | INTRAMUSCULAR | Status: AC | PRN
  Administered 2019-12-30: 09:00:00 3 mL

## 2019-12-30 MED ORDER — METHYLPREDNISOLONE ACETATE 40 MG/ML IJ SUSP
40.0000 mg | INTRAMUSCULAR | Status: AC | PRN
  Administered 2019-12-30: 09:00:00 40 mg via INTRA_ARTICULAR

## 2019-12-30 NOTE — Progress Notes (Signed)
Office Visit Note   Patient: Nancy Eaton           Date of Birth: [birth year]           MRN: [id number] Visit Date: 12/30/2019              Requested by: Jeoffrey Massed, MD [address] [place] [address] [place],  [place] [address] PCP: Jeoffrey Massed, MD   Assessment & Plan: Visit Diagnoses:  1. Chronic pain of right knee   2. Unilateral primary osteoarthritis, right knee     Plan: I was able to aspirate about 20 cc of fluid off her knee which was bloody fluid indicative of more of an acute injury like she describes.  I did not place a steroid injection in her knee.  Given her mechanical symptoms a MRI is warranted to rule out a flap tear of the medial meniscus.  She agrees with this treatment plan.  We will follow her up in 2 weeks and hopefully she will have had the MRI by then.  She will continue with her knee brace and crutch as needed.  Follow-Up Instructions: Return in about 2 weeks (around 01/13/2020).   Orders:  Orders Placed This Encounter  Procedures  . Large Joint Inj  . XR Knee 1-2 Views Right   No orders of the defined types were placed in this encounter.     Procedures: Large Joint Inj: R knee on 12/30/2019 8:40 AM Indications: diagnostic evaluation and pain Details: 22 G 1.5 in needle, superolateral approach  Arthrogram: No  Medications: 3 mL lidocaine 1 %; 40 mg methylPREDNISolone acetate 40 MG/ML Outcome: tolerated well, no immediate complications Procedure, treatment alternatives, risks and benefits explained, specific risks discussed. Consent was given by the patient. Immediately prior to procedure a time out was called to verify the correct patient, procedure, equipment, support staff and site/side marked as required. Patient was prepped and draped in the usual sterile fashion.       Clinical Data: No additional findings.   Subjective: Chief Complaint  Patient presents with  . Right Knee - Pain  The patient is a very pleasant 64 year old female  that I have not seen in a long period time.  We did carpal tunnel surgery on her years ago.  She has been dealing with right knee pain with locking and catching for about a week now after her knee buckled on her.  She does have a history of osteoarthritis of that right knee and has had flexogenic's injections in that knee.  She says it really does not hurt her but she stands on her knee all day for work and it hurts in the back of her knee and she has been having severe locking and catching.  She is in a knee brace and ambulating with a crutch as well.  Her right knee has known osteoarthritis but is not really hurt her at all.  She now feels like it is unstable and she is needing her crutch to ambulate with.  HPI  Review of Systems She currently denies any headache, chest pain, shortness of breath, fever, chills, nausea, vomiting  Objective: Vital Signs: There were no vitals taken for this visit.  Physical Exam She is alert and orient x3 and in no acute distress Ortho Exam Examination of her right knee does show a moderate effusion with varus malalignment.  She has pain past 9 degrees of flexion in the posterior aspect of her knee and a  positive McMurray's exam to the medial compartment.  She has medial joint line tenderness as well. Specialty Comments:  No specialty comments available.  Imaging: XR Knee 1-2 Views Right  Result Date: 12/30/2019 2 views of the right knee show varus malalignment.  There is significant medial joint space narrowing and periarticular osteophytes in all 3 compartments.  There is significant patellofemoral disease.    PMFS History: Patient Active Problem List   Diagnosis Date Noted  . Unilateral primary osteoarthritis, right knee 12/30/2019  . Acute right-sided low back pain with right-sided sciatica 12/25/2017  . Health maintenance examination 09/30/2013  . Hyperlipidemia 09/30/2013   Past Medical History:  Diagnosis Date  . Hyperlipidemia    Lovastatin  started approx 2011  . IFG (impaired fasting glucose) 2018   HbA1c 5.9% (stable)  . Sigmoid diverticulosis 04/2010   "     "     "    "    Family History  Problem Relation Age of Onset  . Cancer Mother        Brain  . Alcohol abuse Father   . COPD Brother   . ADD / ADHD Son   . Anxiety disorder Son   . Heart disease Brother     Past Surgical History:  Procedure Laterality Date  . BREAST ENHANCEMENT SURGERY     As of mammogram 12/2017--rupture of implants stable.  Marland Kitchen CARPAL TUNNEL RELEASE    . CESAREAN SECTION    . COLONOSCOPY  age 3   [facility]--recall 10 yrs   Social History   Occupational History  . Not on file  Tobacco Use  . Smoking status: Never Smoker  . Smokeless tobacco: Never Used  Substance and Sexual Activity  . Alcohol use: Yes    Comment: socially  . Drug use: No  . Sexual activity: Not on file

## 2020-01-13 ENCOUNTER — Ambulatory Visit: Payer: BLUE CROSS/BLUE SHIELD | Admitting: Orthopaedic Surgery

## 2020-01-20 ENCOUNTER — Encounter: Payer: Self-pay | Admitting: Orthopaedic Surgery

## 2020-01-20 ENCOUNTER — Ambulatory Visit: Payer: Medicare Other | Admitting: Orthopaedic Surgery

## 2020-01-20 ENCOUNTER — Other Ambulatory Visit: Payer: Self-pay

## 2020-01-20 DIAGNOSIS — M1711 Unilateral primary osteoarthritis, right knee: Secondary | ICD-10-CM | POA: Diagnosis not present

## 2020-01-20 NOTE — Progress Notes (Signed)
The patient comes in today to go over an MRI of her right knee.  Although she has x-rays showing bone-on-bone wear of that knee we did obtain an MRI to rule out an acute flap tear of the meniscus.  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  We did inject her left knee at the last visit with a steroid and that has helped.  She apparently has had also stem cell injections in that knee in the past.  I did go over x-ray findings again from the last visit.  She has complete loss of the medial joint line.  There is large peritracheal osteophytes around the patellofemoral joint and varus malalignment.  This is severe end-stage arthritis.  The MRI of her right knee also shows complete loss of articular cartilage on the medial compartment of her knee.  The meniscus is completely torn from anterior to posterior.  There is cartilage thinning of the lateral compartment and severe patellofemoral disease.  There is a moderate effusion and a large Baker's cyst.  There is also parameniscal cystic changes around the lateral compartment of her knee.  She understands this is severe end-stage arthritis and I would not recommend arthroscopic intervention at all at this point.  The only surgical option at this point would be a knee replacement.  She understands that fully and will let us know if she would like to have that scheduled any point.  She will continue to work on quad strengthening exercises and activity modification in the interim.  At this point even hyaluronic acid would not help given the severity of arthritis.  We can always repeat a steroid injection in a few months if needed.  All question concerns were answered and addressed.

## 2020-01-26 ENCOUNTER — Ambulatory Visit: Payer: Medicare Other | Attending: Internal Medicine

## 2020-01-26 DIAGNOSIS — Z23 Encounter for immunization: Secondary | ICD-10-CM

## 2020-01-26 NOTE — Progress Notes (Signed)
   Covid-19 Vaccination Clinic  Name:  Nancy Eaton    MRN: [id number] DOB: 23-Sep-1956  01/26/2020  [name] was observed post Covid-19 immunization for 15 minutes without incident. She was provided with Vaccine Information Sheet and instruction to access the V-Safe system.   [name] was instructed to call 911 with any severe reactions post vaccine: Marland Kitchen Difficulty breathing  . Swelling of face and throat  . A fast heartbeat  . A bad rash all over body  . Dizziness and weakness   Immunizations Administered    Name Date Dose VIS Date Route   Pfizer COVID-19 Vaccine 01/26/2020 10:00 AM 0.3 mL 11/01/2019 Intramuscular   Manufacturer: [employer], [employer]   Lot: [id number]   NDC: [address]-8

## 2020-02-03 ENCOUNTER — Telehealth: Payer: Self-pay | Admitting: Orthopedic Surgery

## 2020-02-03 NOTE — Telephone Encounter (Signed)
Ms. Clute called in to advise Dr. Magnus Ivan that she is ready to discuss scheduling a knee replacement.  Wants to know what the next steps are.  Please call [phone].

## 2020-02-03 NOTE — Telephone Encounter (Signed)
Please advise 

## 2020-02-13 ENCOUNTER — Encounter: Payer: Self-pay | Admitting: Family Medicine

## 2020-02-18 ENCOUNTER — Telehealth: Payer: Self-pay

## 2020-02-19 NOTE — Telephone Encounter (Signed)
Spoke with patient about scheduling TKA, she will call me back to schedule in the summer.

## 2020-02-25 ENCOUNTER — Ambulatory Visit: Payer: Medicare Other | Attending: Internal Medicine

## 2020-02-25 DIAGNOSIS — Z23 Encounter for immunization: Secondary | ICD-10-CM

## 2020-04-29 ENCOUNTER — Other Ambulatory Visit: Payer: Self-pay

## 2020-07-02 NOTE — Progress Notes (Signed)
Please enter orders for PAT visit 07-07-20.  Surgery scheduled for 07-17-20.  Thank you

## 2020-07-02 NOTE — Progress Notes (Addendum)
COVID Vaccine Completed: x2 Date COVID Vaccine completed: 01-26-20 & 02-25-20 COVID vaccine manufacturer: Pfizer    Moderna   [employer]   PCP - Lupita Leash Drosinis Cardiologist - N/A  Chest x-ray - N/A EKG - N/A Stress Test - N/A ECHO - N/A Cardiac Cath - N/A  Sleep Study - N/A CPAP - N/A  Fasting Blood Sugar - N/A Checks Blood Sugar _____ times a day  Blood Thinner Instructions: N/A Aspirin Instructions: N/A Last Dose:  Anesthesia review:   Patient denies shortness of breath, fever, cough and chest pain at PAT appointment   Patient verbalized understanding of instructions that were given to them at the PAT appointment. Patient was also instructed that they will need to review over the PAT instructions again at home before surgery.

## 2020-07-02 NOTE — Patient Instructions (Addendum)
DUE TO COVID-19 ONLY ONE VISITOR IS ALLOWED TO COME WITH YOU AND STAY IN THE WAITING ROOM ONLY DURING PRE  OP AND PROCEDURE.   IF YOU WILL BE ADMITTED INTO THE HOSPITAL YOU ARE ALLOWED ONE SUPPORT PERSON DURING VISITATION HOURS ONLY  (10AM -8PM)   . The support person may change daily. . The support person must pass our screening, gel in and out, and wear a mask at all times, including in the patient's room. . Patients must also wear a mask when staff or their support person are in the room.   COVID SWAB TESTING MUST BE COMPLETED ON:  Tuesday, 07-14-20 @ 8:15 AM    [address] [place], [place] [address]  (Must self quarantine after testing. Follow instructions on handout.)        Your procedure is scheduled on:  Friday, 07-17-2020   Report to [facility] Main  Entrance   Report to Short Stay at 6:00 AM   [facility])      Call this number if you have problems the morning of surgery [phone]   Do not eat food or drink liquids after:  After Midnight.    Oral Hygiene is also important to reduce your risk of infection.                                     Remember - BRUSH YOUR TEETH THE MORNING OF SURGERY WITH YOUR REGULAR TOOTHPASTE   Do NOT smoke after Midnight   Take these medicines the morning of surgery with A SIP OF WATER: Atorvastatin                                 You may not have any metal on your body including hair pins, jewelry, and body piercings             Do not wear make-up, lotions, powders, perfumes/cologne, or deodorant             Do not wear nail polish.  Do not shave  48 hours prior to surgery.              Do not bring valuables to the hospital. [facility] IS NOT RESPONSIBLE   FOR VALUABLES.   Contacts, dentures or bridgework may not be worn into surgery.   Bring small overnight bag day of surgery.                 Please read over the following fact sheets you were given: IF YOU HAVE QUESTIONS ABOUT YOUR PRE OP INSTRUCTIONS PLEASE   CALL  [phone]   [place] - Preparing for Surgery Before surgery, you can play an important role.  Because skin is not sterile, your skin needs to be as free of germs as possible.  You can reduce the number of germs on your skin by washing with CHG (chlorahexidine gluconate) soap before surgery.  CHG is an antiseptic cleaner which kills germs and bonds with the skin to continue killing germs even after washing. Please DO NOT use if you have an allergy to CHG or antibacterial soaps.  If your skin becomes reddened/irritated stop using the CHG and inform your nurse when you arrive at Short Stay. Do not shave (including legs and underarms) for at least 48 hours prior to the first CHG shower.  You may shave your face/neck.  Please follow these instructions carefully:  1.  Shower with CHG Soap the night before surgery and the  morning of surgery.  2.  If you choose to wash your hair, wash your hair first as usual with your normal  shampoo.  3.  After you shampoo, rinse your hair and body thoroughly to remove the shampoo.                             4.  Use CHG as you would any other liquid soap.  You can apply chg directly to the skin and wash.  Gently with a scrungie or clean washcloth.  5.  Apply the CHG Soap to your body ONLY FROM THE NECK DOWN.   Do   not use on face/ open                           Wound or open sores. Avoid contact with eyes, ears mouth and   genitals (private parts).                       Wash face,  Genitals (private parts) with your normal soap.             6.  Wash thoroughly, paying special attention to the area where your    surgery  will be performed.  7.  Thoroughly rinse your body with warm water from the neck down.  8.  DO NOT shower/wash with your normal soap after using and rinsing off the CHG Soap.                9.  Pat yourself dry with a clean towel.            10.  Wear clean pajamas.            11.  Place clean sheets on your bed the night of your first  shower and do not  sleep with pets. Day of Surgery : Do not apply any lotions/deodorants the morning of surgery.  Please wear clean clothes to the hospital/surgery center.  FAILURE TO FOLLOW THESE INSTRUCTIONS MAY RESULT IN THE CANCELLATION OF YOUR SURGERY  PATIENT SIGNATURE_________________________________  NURSE SIGNATURE__________________________________  ________________________________________________________________________

## 2020-07-07 ENCOUNTER — Encounter (HOSPITAL_COMMUNITY): Payer: Self-pay

## 2020-07-07 ENCOUNTER — Encounter (HOSPITAL_COMMUNITY)
Admission: RE | Admit: 2020-07-07 | Discharge: 2020-07-07 | Disposition: A | Discharge status: Home / Self Care | Payer: Medicare Other | Source: Ambulatory Visit | Attending: Orthopaedic Surgery | Admitting: Orthopaedic Surgery

## 2020-07-07 ENCOUNTER — Other Ambulatory Visit: Payer: Self-pay

## 2020-07-07 DIAGNOSIS — Z01812 Encounter for preprocedural laboratory examination: Secondary | ICD-10-CM | POA: Insufficient documentation

## 2020-07-07 HISTORY — DX: Prediabetes: R73.03

## 2020-07-07 HISTORY — DX: Other specified postprocedural states: R11.2

## 2020-07-07 HISTORY — DX: Other specified postprocedural states: Z98.890

## 2020-07-07 HISTORY — DX: Unspecified osteoarthritis, unspecified site: M19.90

## 2020-07-07 LAB — CBC
HCT: 43 % (ref 36.0–46.0)
Hemoglobin: 13.9 g/dL (ref 12.0–15.0)
MCH: 29.5 pg (ref 26.0–34.0)
MCHC: 32.3 g/dL (ref 30.0–36.0)
MCV: 91.3 fL (ref 80.0–100.0)
Platelets: 284 10*3/uL (ref 150–400)
RBC: 4.71 MIL/uL (ref 3.87–5.11)
RDW: 13.1 % (ref 11.5–15.5)
WBC: 8.3 10*3/uL (ref 4.0–10.5)
nRBC: 0 % (ref 0.0–0.2)

## 2020-07-07 LAB — SURGICAL PCR SCREEN
MRSA, PCR: NEGATIVE
Staphylococcus aureus: POSITIVE — AB

## 2020-07-07 LAB — BASIC METABOLIC PANEL
Anion gap: 10 (ref 5–15)
BUN: 19 mg/dL (ref 8–23)
CO2: 27 mmol/L (ref 22–32)
Calcium: 9.9 mg/dL (ref 8.9–10.3)
Chloride: 104 mmol/L (ref 98–111)
Creatinine, Ser: 0.7 mg/dL (ref 0.44–1.00)
GFR calc Af Amer: >60 mL/min (ref >60)
GFR calc non Af Amer: >60 mL/min (ref >60)
Glucose, Bld: 114 mg/dL — ABNORMAL HIGH (ref 70–99)
Potassium: 4.8 mmol/L (ref 3.5–5.1)
Sodium: 141 mmol/L (ref 135–145)

## 2020-07-07 LAB — HEMOGLOBIN A1C
Hgb A1c MFr Bld: 5.5 % (ref 4.8–5.6)
Mean Plasma Glucose: 111.15 mg/dL

## 2020-07-07 NOTE — Progress Notes (Signed)
PCR results sent to Dr. Maureen Ralphs for review

## 2020-07-14 ENCOUNTER — Other Ambulatory Visit (HOSPITAL_COMMUNITY)
Admission: RE | Admit: 2020-07-14 | Discharge: 2020-07-14 | Disposition: A | Discharge status: Home / Self Care | Payer: Medicare Other | Source: Ambulatory Visit | Attending: Orthopaedic Surgery | Admitting: Orthopaedic Surgery

## 2020-07-14 ENCOUNTER — Other Ambulatory Visit: Payer: Self-pay | Admitting: Physician Assistant

## 2020-07-14 DIAGNOSIS — Z20822 Contact with and (suspected) exposure to covid-19: Secondary | ICD-10-CM | POA: Insufficient documentation

## 2020-07-14 DIAGNOSIS — Z01812 Encounter for preprocedural laboratory examination: Secondary | ICD-10-CM | POA: Insufficient documentation

## 2020-07-14 LAB — SARS CORONAVIRUS 2 (TAT 6-24 HRS): SARS Coronavirus 2: NEGATIVE

## 2020-07-16 NOTE — Anesthesia Preprocedure Evaluation (Addendum)
Anesthesia Evaluation  Patient identified by MRN, date of birth, ID band Patient awake    Reviewed: Allergy & Precautions, NPO status , Patient's Chart, lab work & pertinent test results  History of Anesthesia Complications (+) PONV and history of anesthetic complications  Airway Mallampati: III  TM Distance: >3 FB Neck ROM: Full    Dental no notable dental hx.    Pulmonary neg pulmonary ROS,    Pulmonary exam normal breath sounds clear to auscultation       Cardiovascular negative cardio ROS Normal cardiovascular exam Rhythm:Regular Rate:Normal     Neuro/Psych negative neurological ROS  negative psych ROS   GI/Hepatic negative GI ROS, Neg liver ROS,   Endo/Other  negative endocrine ROS  Renal/GU negative Renal ROS     Musculoskeletal  (+) Arthritis ,   Abdominal (+) + obese,   Peds  Hematology HLD   Anesthesia Other Findings right knee osteoarthritis  Reproductive/Obstetrics                            Anesthesia Physical Anesthesia Plan  ASA: II  Anesthesia Plan: Regional and Spinal   Post-op Pain Management:  Regional for Post-op pain   Induction: Intravenous  PONV Risk Score and Plan: 3 and Ondansetron, Dexamethasone, Propofol infusion, Midazolam and Treatment may vary due to age or medical condition  Airway Management Planned: Simple Face Mask  Additional Equipment:   Intra-op Plan:   Post-operative Plan:   Informed Consent: I have reviewed the patients History and Physical, chart, labs and discussed the procedure including the risks, benefits and alternatives for the proposed anesthesia with the patient or authorized representative who has indicated his/her understanding and acceptance.     Dental advisory given  Plan Discussed with: CRNA  Anesthesia Plan Comments:         Anesthesia Quick Evaluation

## 2020-07-16 NOTE — H&P (Signed)
TOTAL KNEE ADMISSION H&P  Patient is being admitted for right total knee arthroplasty.  Subjective:  Chief Complaint:right knee pain.  HPI: Nancy Eaton, 64 y.o. female, has a history of pain and functional disability in the right knee due to arthritis and has failed non-surgical conservative treatments for greater than 12 weeks to includeNSAID's and/or analgesics, corticosteriod injections, viscosupplementation injections, flexibility and strengthening excercises and activity modification.  Onset of symptoms was gradual, starting 3 years ago with gradually worsening course since that time. The patient noted no past surgery on the right knee(s).  Patient currently rates pain in the right knee(s) at 10 out of 10 with activity. Patient has night pain, worsening of pain with activity and weight bearing, pain that interferes with activities of daily living, pain with passive range of motion, crepitus and joint swelling.  Patient has evidence of subchondral sclerosis, periarticular osteophytes and joint space narrowing by imaging studies. There is no active infection.  Patient Active Problem List   Diagnosis Date Noted  . Unilateral primary osteoarthritis, right knee 12/30/2019  . Acute right-sided low back pain with right-sided sciatica 12/25/2017  . Health maintenance examination 09/30/2013  . Hyperlipidemia 09/30/2013   Past Medical History:  Diagnosis Date  . Arthritis    Knee  . Hyperlipidemia    Lovastatin started approx 2011  . IFG (impaired fasting glucose) 2018   HbA1c 5.9% (stable)  . PONV (postoperative nausea and vomiting)   . Pre-diabetes   . Right knee pain 12/2019   End stage: TKA recommended by ortho 01/2020->pt considering  . Sigmoid diverticulosis 04/2010   "     "     "    "    Past Surgical History:  Procedure Laterality Date  . BREAST ENHANCEMENT SURGERY     As of mammogram 12/2017--rupture of implants stable.  Marland Kitchen CARPAL TUNNEL RELEASE    . CESAREAN SECTION     . COLONOSCOPY  age 7   [facility]--recall 10 yrs    No current facility-administered medications for this encounter.   Current Outpatient Medications  Medication Sig Dispense Refill Last Dose  . acetaminophen (TYLENOL) 500 MG tablet Take 500 mg by mouth at bedtime.     Marland Kitchen atorvastatin (LIPITOR) 40 MG tablet Take 1 tablet (40 mg total) by mouth daily. 90 tablet 0   . Calcium Polycarbophil (FIBER-CAPS PO) Take 5 g by mouth in the morning and at bedtime.     Marland Kitchen CALCIUM-VITAMIN D PO Take 2 tablets by mouth daily.     . COLLAGEN PO Take 6,000 mg by mouth 4 (four) times daily. Biotin     . Glucosamine HCl-MSM (GLUCOSAMINE-MSM PO) Take 1 tablet by mouth daily.     Marland Kitchen ibuprofen (ADVIL) 200 MG tablet Take 200 mg by mouth every 6 (six) hours as needed for headache or moderate pain.     . Multiple Vitamins-Minerals (MULTIVITAMIN WITH MINERALS) tablet Take 1 tablet by mouth daily. Women 50+     . Multiple Vitamins-Minerals (PRESERVISION AREDS 2) CAPS Take 1 tablet by mouth daily.     . Omega-3 Fatty Acids (FISH OIL PO) Take 1,400 mg by mouth daily. Omega3 980 mg     . Turmeric 500 MG CAPS Take 1,500 mg by mouth in the morning, at noon, and at bedtime.       Allergies  Allergen Reactions  . Penicillins Rash    20 years     Social History   Tobacco Use  . Smoking  status: Never Smoker  . Smokeless tobacco: Never Used  Substance Use Topics  . Alcohol use: Yes    Comment: socially    Family History  Problem Relation Age of Onset  . Cancer Mother        Brain  . Alcohol abuse Father   . COPD Brother   . ADD / ADHD Son   . Anxiety disorder Son   . Heart disease Brother      Review of Systems  Musculoskeletal: Positive for joint swelling.  All other systems reviewed and are negative.   Objective:  Physical Exam Vitals reviewed.  Constitutional:      Appearance: Normal appearance.  HENT:     Head: Normocephalic and atraumatic.  Eyes:     Extraocular Movements: Extraocular movements  intact.     Pupils: Pupils are equal, round, and reactive to light.  Cardiovascular:     Rate and Rhythm: Normal rate.     Pulses: Normal pulses.  Pulmonary:     Effort: Pulmonary effort is normal.  Abdominal:     Palpations: Abdomen is soft.  Musculoskeletal:     Cervical back: Normal range of motion.     Right knee: Effusion and bony tenderness present. Decreased range of motion. Tenderness present over the medial joint line. Abnormal alignment.  Neurological:     Mental Status: She is alert and oriented to person, place, and time.  Psychiatric:        Behavior: Behavior normal.     Vital signs in last 24 hours:    Labs:   Estimated body mass index is 33.05 kg/m as calculated from the following:   Height as of 07/07/20: 5\' 3"  (1.6 m).   Weight as of 07/07/20: 84.6 kg.   Imaging Review Plain radiographs demonstrate severe degenerative joint disease of the right knee(s). The overall alignment ismild varus. The bone quality appears to be excellent for age and reported activity level.      Assessment/Plan:  End stage arthritis, right knee   The patient history, physical examination, clinical judgment of the provider and imaging studies are consistent with end stage degenerative joint disease of the right knee(s) and total knee arthroplasty is deemed medically necessary. The treatment options including medical management, injection therapy arthroscopy and arthroplasty were discussed at length. The risks and benefits of total knee arthroplasty were presented and reviewed. The risks due to aseptic loosening, infection, stiffness, patella tracking problems, thromboembolic complications and other imponderables were discussed. The patient acknowledged the explanation, agreed to proceed with the plan and consent was signed. Patient is being admitted for inpatient treatment for surgery, pain control, PT, OT, prophylactic antibiotics, VTE prophylaxis, progressive ambulation and ADL's and  discharge planning. The patient is planning to be discharged home with home health services

## 2020-07-17 ENCOUNTER — Ambulatory Visit (HOSPITAL_COMMUNITY): Payer: Medicare Other | Admitting: Certified Registered Nurse Anesthetist

## 2020-07-17 ENCOUNTER — Observation Stay (HOSPITAL_COMMUNITY)
Admission: RE | Admit: 2020-07-17 | Discharge: 2020-07-18 | Disposition: A | Discharge status: Home / Self Care | Payer: Medicare Other | Source: Ambulatory Visit | Attending: Orthopaedic Surgery | Admitting: Orthopaedic Surgery

## 2020-07-17 ENCOUNTER — Encounter (HOSPITAL_COMMUNITY)
Admission: RE | Disposition: A | Discharge status: Home / Self Care | Payer: Self-pay | Source: Ambulatory Visit | Attending: Orthopaedic Surgery

## 2020-07-17 ENCOUNTER — Other Ambulatory Visit: Payer: Self-pay

## 2020-07-17 ENCOUNTER — Encounter (HOSPITAL_COMMUNITY): Payer: Self-pay | Admitting: Orthopaedic Surgery

## 2020-07-17 ENCOUNTER — Observation Stay (HOSPITAL_COMMUNITY): Payer: Medicare Other

## 2020-07-17 DIAGNOSIS — Z79899 Other long term (current) drug therapy: Secondary | ICD-10-CM | POA: Insufficient documentation

## 2020-07-17 DIAGNOSIS — M1711 Unilateral primary osteoarthritis, right knee: Principal | ICD-10-CM | POA: Insufficient documentation

## 2020-07-17 DIAGNOSIS — E785 Hyperlipidemia, unspecified: Secondary | ICD-10-CM | POA: Insufficient documentation

## 2020-07-17 DIAGNOSIS — R7303 Prediabetes: Secondary | ICD-10-CM | POA: Insufficient documentation

## 2020-07-17 DIAGNOSIS — M25561 Pain in right knee: Secondary | ICD-10-CM | POA: Diagnosis present

## 2020-07-17 DIAGNOSIS — Z96651 Presence of right artificial knee joint: Secondary | ICD-10-CM

## 2020-07-17 HISTORY — PX: TOTAL KNEE ARTHROPLASTY: SHX125

## 2020-07-17 LAB — TYPE AND SCREEN
ABO/RH(D): O NEG
Antibody Screen: NEGATIVE

## 2020-07-17 SURGERY — ARTHROPLASTY, KNEE, TOTAL
Anesthesia: Regional | Site: Knee | Laterality: Right

## 2020-07-17 MED ORDER — PANTOPRAZOLE SODIUM 40 MG PO TBEC
40.0000 mg | DELAYED_RELEASE_TABLET | Freq: Every day | ORAL | Status: DC
  Administered 2020-07-18: 40 mg via ORAL
  Filled 2020-07-17: qty 1

## 2020-07-17 MED ORDER — CLINDAMYCIN PHOSPHATE 600 MG/50ML IV SOLN
600.0000 mg | Freq: Four times a day (QID) | INTRAVENOUS | Status: AC
  Administered 2020-07-17 (×2): 600 mg via INTRAVENOUS
  Filled 2020-07-17 (×2): qty 50

## 2020-07-17 MED ORDER — OXYCODONE HCL 5 MG PO TABS
5.0000 mg | ORAL_TABLET | ORAL | Status: DC | PRN
  Administered 2020-07-17: 10 mg via ORAL
  Filled 2020-07-17: qty 2

## 2020-07-17 MED ORDER — ATORVASTATIN CALCIUM 40 MG PO TABS: 40.0000 mg | ORAL_TABLET | Freq: Every day | ORAL | Status: DC

## 2020-07-17 MED ORDER — BUPIVACAINE-EPINEPHRINE 0.25% -1:200000 IJ SOLN
INTRAMUSCULAR | Status: DC | PRN
  Administered 2020-07-17: 20 mL

## 2020-07-17 MED ORDER — ACETAMINOPHEN 325 MG PO TABS
325.0000 mg | ORAL_TABLET | Freq: Four times a day (QID) | ORAL | Status: DC | PRN
  Administered 2020-07-18: 650 mg via ORAL
  Filled 2020-07-17: qty 2

## 2020-07-17 MED ORDER — FENTANYL CITRATE (PF) 100 MCG/2ML IJ SOLN
50.0000 ug | INTRAMUSCULAR | Status: DC
  Filled 2020-07-17: qty 2

## 2020-07-17 MED ORDER — GABAPENTIN 300 MG PO CAPS
300.0000 mg | ORAL_CAPSULE | Freq: Three times a day (TID) | ORAL | Status: DC
  Administered 2020-07-17 – 2020-07-18 (×3): 300 mg via ORAL
  Filled 2020-07-17 (×3): qty 1

## 2020-07-17 MED ORDER — OXYCODONE HCL 5 MG PO TABS: 10.0000 mg | ORAL_TABLET | ORAL | Status: DC | PRN

## 2020-07-17 MED ORDER — PROPOFOL 10 MG/ML IV BOLUS
INTRAVENOUS | Status: AC
  Filled 2020-07-17: qty 20

## 2020-07-17 MED ORDER — FENTANYL CITRATE (PF) 100 MCG/2ML IJ SOLN
INTRAMUSCULAR | Status: AC
  Filled 2020-07-17: qty 2

## 2020-07-17 MED ORDER — PROPOFOL 10 MG/ML IV BOLUS
INTRAVENOUS | Status: DC | PRN
  Administered 2020-07-17: 100 mg via INTRAVENOUS

## 2020-07-17 MED ORDER — BUPIVACAINE IN DEXTROSE 0.75-8.25 % IT SOLN
INTRATHECAL | Status: DC | PRN
  Administered 2020-07-17: 1.6 mL via INTRATHECAL

## 2020-07-17 MED ORDER — MENTHOL 3 MG MT LOZG: 1.0000 | LOZENGE | OROMUCOSAL | Status: DC | PRN

## 2020-07-17 MED ORDER — HYDROMORPHONE HCL 1 MG/ML IJ SOLN: 0.5000 mg | INTRAMUSCULAR | Status: DC | PRN

## 2020-07-17 MED ORDER — ROPIVACAINE HCL 5 MG/ML IJ SOLN
INTRAMUSCULAR | Status: DC | PRN
  Administered 2020-07-17: 30 mL via PERINEURAL

## 2020-07-17 MED ORDER — ASPIRIN 81 MG PO CHEW
81.0000 mg | CHEWABLE_TABLET | Freq: Two times a day (BID) | ORAL | Status: DC
  Administered 2020-07-17 – 2020-07-18 (×2): 81 mg via ORAL
  Filled 2020-07-17 (×2): qty 1

## 2020-07-17 MED ORDER — METHOCARBAMOL 500 MG IVPB - SIMPLE MED
INTRAVENOUS | Status: AC
  Filled 2020-07-17: qty 50

## 2020-07-17 MED ORDER — CHLORHEXIDINE GLUCONATE 0.12 % MT SOLN
15.0000 mL | Freq: Once | OROMUCOSAL | Status: AC
  Administered 2020-07-17: 15 mL via OROMUCOSAL

## 2020-07-17 MED ORDER — MIDAZOLAM HCL 2 MG/2ML IJ SOLN
1.0000 mg | INTRAMUSCULAR | Status: DC
  Administered 2020-07-17: 2 mg via INTRAVENOUS
  Filled 2020-07-17: qty 2

## 2020-07-17 MED ORDER — ONDANSETRON HCL 4 MG PO TABS: 4.0000 mg | ORAL_TABLET | Freq: Four times a day (QID) | ORAL | Status: DC | PRN

## 2020-07-17 MED ORDER — DIPHENHYDRAMINE HCL 12.5 MG/5ML PO ELIX: 12.5000 mg | ORAL_SOLUTION | ORAL | Status: DC | PRN

## 2020-07-17 MED ORDER — HYDROMORPHONE HCL 1 MG/ML IJ SOLN
0.2500 mg | INTRAMUSCULAR | Status: DC | PRN
  Administered 2020-07-17 (×2): 0.5 mg via INTRAVENOUS

## 2020-07-17 MED ORDER — TRANEXAMIC ACID-NACL 1000-0.7 MG/100ML-% IV SOLN
1000.0000 mg | INTRAVENOUS | Status: AC
  Administered 2020-07-17: 1000 mg via INTRAVENOUS
  Filled 2020-07-17: qty 100

## 2020-07-17 MED ORDER — METHOCARBAMOL 500 MG PO TABS
500.0000 mg | ORAL_TABLET | Freq: Four times a day (QID) | ORAL | Status: DC | PRN
  Administered 2020-07-17 – 2020-07-18 (×2): 500 mg via ORAL
  Filled 2020-07-17 (×2): qty 1

## 2020-07-17 MED ORDER — EPHEDRINE SULFATE-NACL 50-0.9 MG/10ML-% IV SOSY
PREFILLED_SYRINGE | INTRAVENOUS | Status: DC | PRN
  Administered 2020-07-17 (×2): 10 mg via INTRAVENOUS
  Administered 2020-07-17 (×2): 5 mg via INTRAVENOUS

## 2020-07-17 MED ORDER — HYDROMORPHONE HCL 1 MG/ML IJ SOLN
INTRAMUSCULAR | Status: AC
  Filled 2020-07-17: qty 1

## 2020-07-17 MED ORDER — POVIDONE-IODINE 10 % EX SWAB
2.0000 "application " | Freq: Once | CUTANEOUS | Status: AC
  Administered 2020-07-17: 2 via TOPICAL

## 2020-07-17 MED ORDER — SODIUM CHLORIDE 0.9 % IV SOLN: INTRAVENOUS | Status: DC

## 2020-07-17 MED ORDER — CLONIDINE HCL (ANALGESIA) 100 MCG/ML EP SOLN
EPIDURAL | Status: DC | PRN
  Administered 2020-07-17: 100 ug

## 2020-07-17 MED ORDER — OXYCODONE HCL 5 MG PO TABS: 5.0000 mg | ORAL_TABLET | Freq: Once | ORAL | Status: DC | PRN

## 2020-07-17 MED ORDER — ALUM & MAG HYDROXIDE-SIMETH 200-200-20 MG/5ML PO SUSP: 30.0000 mL | ORAL | Status: DC | PRN

## 2020-07-17 MED ORDER — SODIUM CHLORIDE 0.9 % IR SOLN
Status: DC | PRN
  Administered 2020-07-17: 1000 mL

## 2020-07-17 MED ORDER — KETOROLAC TROMETHAMINE 15 MG/ML IJ SOLN
15.0000 mg | Freq: Four times a day (QID) | INTRAMUSCULAR | Status: AC
  Administered 2020-07-17 – 2020-07-18 (×4): 15 mg via INTRAVENOUS
  Filled 2020-07-17 (×4): qty 1

## 2020-07-17 MED ORDER — METHOCARBAMOL 500 MG IVPB - SIMPLE MED
500.0000 mg | Freq: Four times a day (QID) | INTRAVENOUS | Status: DC | PRN
  Administered 2020-07-17: 500 mg via INTRAVENOUS
  Filled 2020-07-17: qty 50

## 2020-07-17 MED ORDER — EPHEDRINE 5 MG/ML INJ
INTRAVENOUS | Status: AC
  Filled 2020-07-17: qty 10

## 2020-07-17 MED ORDER — PHENYLEPHRINE 40 MCG/ML (10ML) SYRINGE FOR IV PUSH (FOR BLOOD PRESSURE SUPPORT)
PREFILLED_SYRINGE | INTRAVENOUS | Status: AC
  Filled 2020-07-17: qty 10

## 2020-07-17 MED ORDER — METOCLOPRAMIDE HCL 5 MG PO TABS: 5.0000 mg | ORAL_TABLET | Freq: Three times a day (TID) | ORAL | Status: DC | PRN

## 2020-07-17 MED ORDER — BUPIVACAINE-EPINEPHRINE 0.25% -1:200000 IJ SOLN
INTRAMUSCULAR | Status: AC
  Filled 2020-07-17: qty 1

## 2020-07-17 MED ORDER — PROPOFOL 500 MG/50ML IV EMUL
INTRAVENOUS | Status: DC | PRN
  Administered 2020-07-17: 50 ug/kg/min via INTRAVENOUS

## 2020-07-17 MED ORDER — OXYCODONE HCL 5 MG/5ML PO SOLN: 5.0000 mg | Freq: Once | ORAL | Status: DC | PRN

## 2020-07-17 MED ORDER — PHENYLEPHRINE 40 MCG/ML (10ML) SYRINGE FOR IV PUSH (FOR BLOOD PRESSURE SUPPORT)
PREFILLED_SYRINGE | INTRAVENOUS | Status: DC | PRN
  Administered 2020-07-17 (×2): 80 ug via INTRAVENOUS
  Administered 2020-07-17: 40 ug via INTRAVENOUS

## 2020-07-17 MED ORDER — LACTATED RINGERS IV SOLN: INTRAVENOUS | Status: DC

## 2020-07-17 MED ORDER — FENTANYL CITRATE (PF) 100 MCG/2ML IJ SOLN
INTRAMUSCULAR | Status: DC | PRN
  Administered 2020-07-17: 50 ug via INTRAVENOUS

## 2020-07-17 MED ORDER — PHENOL 1.4 % MT LIQD: 1.0000 | OROMUCOSAL | Status: DC | PRN

## 2020-07-17 MED ORDER — 0.9 % SODIUM CHLORIDE (POUR BTL) OPTIME
TOPICAL | Status: DC | PRN
  Administered 2020-07-17: 1000 mL

## 2020-07-17 MED ORDER — POLYETHYLENE GLYCOL 3350 17 G PO PACK: 17.0000 g | PACK | Freq: Every day | ORAL | Status: DC | PRN

## 2020-07-17 MED ORDER — ONDANSETRON HCL 4 MG/2ML IJ SOLN: 4.0000 mg | Freq: Four times a day (QID) | INTRAMUSCULAR | Status: DC | PRN

## 2020-07-17 MED ORDER — CLINDAMYCIN PHOSPHATE 900 MG/50ML IV SOLN
900.0000 mg | INTRAVENOUS | Status: AC
  Administered 2020-07-17: 900 mg via INTRAVENOUS
  Filled 2020-07-17: qty 50

## 2020-07-17 MED ORDER — ORAL CARE MOUTH RINSE: 15.0000 mL | Freq: Once | OROMUCOSAL | Status: AC

## 2020-07-17 MED ORDER — ACETAMINOPHEN 500 MG PO TABS
1000.0000 mg | ORAL_TABLET | Freq: Once | ORAL | Status: AC
  Administered 2020-07-17: 1000 mg via ORAL
  Filled 2020-07-17: qty 2

## 2020-07-17 MED ORDER — ONDANSETRON HCL 4 MG/2ML IJ SOLN
INTRAMUSCULAR | Status: DC | PRN
  Administered 2020-07-17: 4 mg via INTRAVENOUS

## 2020-07-17 MED ORDER — PROMETHAZINE HCL 25 MG/ML IJ SOLN: 6.2500 mg | INTRAMUSCULAR | Status: DC | PRN

## 2020-07-17 MED ORDER — DOCUSATE SODIUM 100 MG PO CAPS
100.0000 mg | ORAL_CAPSULE | Freq: Two times a day (BID) | ORAL | Status: DC
  Administered 2020-07-17 – 2020-07-18 (×2): 100 mg via ORAL
  Filled 2020-07-17 (×2): qty 1

## 2020-07-17 MED ORDER — METOCLOPRAMIDE HCL 5 MG/ML IJ SOLN
5.0000 mg | Freq: Three times a day (TID) | INTRAMUSCULAR | Status: DC | PRN
  Administered 2020-07-17: 10 mg via INTRAVENOUS
  Filled 2020-07-17: qty 2

## 2020-07-17 SURGICAL SUPPLY — 59 items
APL SKNCLS STERI-STRIP NONHPOA (GAUZE/BANDAGES/DRESSINGS)
BAG SPEC THK2 15X12 ZIP CLS (MISCELLANEOUS) ×1
BAG ZIPLOCK 12X15 (MISCELLANEOUS) ×1 IMPLANT
BASEPLATE TIBIAL TRIATHALON 2 (Plate) ×1 IMPLANT
BENZOIN TINCTURE PRP APPL 2/3 (GAUZE/BANDAGES/DRESSINGS) IMPLANT
BLADE SAG 18X100X1.27 (BLADE) IMPLANT
BLADE SURG SZ10 CARB STEEL (BLADE) ×4 IMPLANT
BNDG ELASTIC 6X5.8 VLCR STR LF (GAUZE/BANDAGES/DRESSINGS) ×3 IMPLANT
BOWL SMART MIX CTS (DISPOSABLE) ×2 IMPLANT
BSPLAT TIB 2 CMNT PRM STRL KN (Plate) ×1 IMPLANT
CEMENT BONE SIMPLEX SPEEDSET (Cement) ×4 IMPLANT
COVER SURGICAL LIGHT HANDLE (MISCELLANEOUS) ×2 IMPLANT
COVER WAND RF STERILE (DRAPES) ×1 IMPLANT
CUFF TOURN SGL QUICK 34 (TOURNIQUET CUFF) ×2
CUFF TRNQT CYL 34X4.125X (TOURNIQUET CUFF) ×1 IMPLANT
DECANTER SPIKE VIAL GLASS SM (MISCELLANEOUS) IMPLANT
DRAPE U-SHAPE 47X51 STRL (DRAPES) ×2 IMPLANT
DRSG PAD ABDOMINAL 8X10 ST (GAUZE/BANDAGES/DRESSINGS) ×4 IMPLANT
DURAPREP 26ML APPLICATOR (WOUND CARE) ×2 IMPLANT
ELECT BLADE TIP CTD 4 INCH (ELECTRODE) ×2 IMPLANT
ELECT REM PT RETURN 15FT ADLT (MISCELLANEOUS) ×2 IMPLANT
FEMORAL PEG DISTAL FIXATION (Orthopedic Implant) ×1 IMPLANT
FEMORAL POST STABILIZED NO 3 (Orthopedic Implant) ×1 IMPLANT
GAUZE SPONGE 4X4 12PLY STRL (GAUZE/BANDAGES/DRESSINGS) ×2 IMPLANT
GAUZE XEROFORM 1X8 LF (GAUZE/BANDAGES/DRESSINGS) ×2 IMPLANT
GLOVE BIO SURGEON STRL SZ7.5 (GLOVE) ×2 IMPLANT
GLOVE BIOGEL PI IND STRL 8 (GLOVE) ×2 IMPLANT
GLOVE BIOGEL PI INDICATOR 8 (GLOVE) ×2
GLOVE ECLIPSE 8.0 STRL XLNG CF (GLOVE) ×2 IMPLANT
GOWN STRL REUS W/TWL XL LVL3 (GOWN DISPOSABLE) ×4 IMPLANT
HANDPIECE INTERPULSE COAX TIP (DISPOSABLE) ×2
HOLDER FOLEY CATH W/STRAP (MISCELLANEOUS) ×1 IMPLANT
IMMOBILIZER KNEE 20 (SOFTGOODS) ×2
IMMOBILIZER KNEE 20 THIGH 36 (SOFTGOODS) ×1 IMPLANT
INSERT TIB BEARING SZ 2 10 (Miscellaneous) ×1 IMPLANT
KIT TURNOVER KIT A (KITS) IMPLANT
NS IRRIG 1000ML POUR BTL (IV SOLUTION) ×2 IMPLANT
PACK TOTAL KNEE CUSTOM (KITS) ×2 IMPLANT
PADDING CAST COTTON 6X4 STRL (CAST SUPPLIES) ×4 IMPLANT
PATELLA TRIATHALON (Knees) ×1 IMPLANT
PENCIL SMOKE EVACUATOR (MISCELLANEOUS) ×1 IMPLANT
PIN FLUTED HEDLESS FIX 3.5X1/8 (PIN) ×1 IMPLANT
PROTECTOR NERVE ULNAR (MISCELLANEOUS) ×2 IMPLANT
SET HNDPC FAN SPRY TIP SCT (DISPOSABLE) ×1 IMPLANT
SET PAD KNEE POSITIONER (MISCELLANEOUS) ×2 IMPLANT
STAPLER VISISTAT 35W (STAPLE) ×1 IMPLANT
STRIP CLOSURE SKIN 1/2X4 (GAUZE/BANDAGES/DRESSINGS) IMPLANT
SUT MNCRL AB 4-0 PS2 18 (SUTURE) IMPLANT
SUT VIC AB 0 CT1 27 (SUTURE) ×2
SUT VIC AB 0 CT1 27XBRD ANTBC (SUTURE) ×1 IMPLANT
SUT VIC AB 1 CT1 36 (SUTURE) ×4 IMPLANT
SUT VIC AB 2-0 CT1 27 (SUTURE) ×4
SUT VIC AB 2-0 CT1 TAPERPNT 27 (SUTURE) ×2 IMPLANT
SYR BULB IRRIG 60ML STRL (SYRINGE) ×1 IMPLANT
TRAY FOLEY MTR SLVR 14FR STAT (SET/KITS/TRAYS/PACK) ×1 IMPLANT
TRAY FOLEY MTR SLVR 16FR STAT (SET/KITS/TRAYS/PACK) ×1 IMPLANT
WATER STERILE IRR 1000ML POUR (IV SOLUTION) ×3 IMPLANT
WRAP KNEE MAXI GEL POST OP (GAUZE/BANDAGES/DRESSINGS) ×1 IMPLANT
YANKAUER SUCT BULB TIP NO VENT (SUCTIONS) ×1 IMPLANT

## 2020-07-17 NOTE — Progress Notes (Signed)
AssistedDr. Ellender with right, ultrasound guided, adductor canal block. Side rails up, monitors on throughout procedure. See vital signs in flow sheet. Tolerated Procedure well.  

## 2020-07-17 NOTE — Evaluation (Signed)
Physical Therapy Evaluation Patient Details Name: Nancy Eaton MRN: [id number] DOB: 11-Nov-1956 Today's Date: 07/17/2020   History of Present Illness  Patient is 64 y.o. female s/p Rt TKA on 07/17/20 with PMH significant for HLD, OA.  Clinical Impression  Nancy Eaton is a 64 y.o. female POD 0 s/p Rt TKA. Patient reports independence with mobility at baseline. Patient is now limited by functional impairments (see PT problem list below) and requires min assist for transfers and gait with RW. Patient was able to ambulate ~50 feet with RW and min assist. Patient instructed in exercise to facilitate ROM and circulation. Patient will benefit from continued skilled PT interventions to address impairments and progress towards PLOF. Acute PT will follow to progress mobility and stair training in preparation for safe discharge home.     Follow Up Recommendations Follow surgeon's recommendation for DC plan and follow-up therapies;Home health PT    Equipment Recommendations  None recommended by PT    Recommendations for Other Services       Precautions / Restrictions Precautions Precautions: Fall Restrictions Weight Bearing Restrictions: No Other Position/Activity Restrictions: WBAT      Mobility  Bed Mobility Overal bed mobility: Needs Assistance Bed Mobility: Supine to Sit     Supine to sit: Min assist;HOB elevated     General bed mobility comments: cues to use rail, assist for Rt LE to bring to EOB.  Transfers Overall transfer level: Needs assistance Equipment used: Rolling walker (2 wheeled) Transfers: Sit to/from Stand Sit to Stand: Min assist         General transfer comment: cues for safe technique with RW, min assist for power up and to steady with rising.  Ambulation/Gait Ambulation/Gait assistance: Min assist Gait Distance (Feet): 50 Feet Assistive device: Rolling walker (2 wheeled) Gait Pattern/deviations: Step-to pattern Gait velocity: decr    General Gait Details: cues for step pattern in walker and assist to manage walker proximity. no overt LOB noted.   Stairs            Wheelchair Mobility    Modified Rankin (Stroke Patients Only)       Balance Overall balance assessment: Needs assistance Sitting-balance support: Feet supported Sitting balance-Leahy Scale: Good     Standing balance support: During functional activity Standing balance-Leahy Scale: Fair                               Pertinent Vitals/Pain Pain Assessment: 0-10 Pain Score: 2  Pain Location: Rt knee Pain Descriptors / Indicators: Aching;Burning;Discomfort Pain Intervention(s): Monitored during session;Limited activity within patient's tolerance;Repositioned;Ice applied    Home Living Family/patient expects to be discharged to:: Private residence Living Arrangements: Alone Available Help at Discharge: Family Type of Home: House Home Access: Stairs to enter Entrance Stairs-Rails: None Entrance Stairs-Number of Steps: 1 Home Layout: One level Home Equipment: Environmental consultant - 2 wheels;Cane - single point;Bedside commode;Shower seat;Grab bars - toilet;Grab bars - tub/shower      Prior Function Level of Independence: Independent               Hand Dominance   Dominant Hand: Right    Extremity/Trunk Assessment   Upper Extremity Assessment Upper Extremity Assessment: Overall WFL for tasks assessed    Lower Extremity Assessment Lower Extremity Assessment: RLE deficits/detail RLE Deficits / Details: good quad acitvation, no extensor lag with SLR RLE Sensation: WNL RLE Coordination: WNL    Cervical / Trunk Assessment Cervical /  Trunk Assessment: Normal  Communication   Communication: No difficulties  Cognition Arousal/Alertness: Awake/alert Behavior During Therapy: WFL for tasks assessed/performed Overall Cognitive Status: Within Functional Limits for tasks assessed                 General Comments       Exercises Total Joint Exercises Ankle Circles/Pumps: AROM;Both;20 reps;Seated Quad Sets: AROM;Right;5 reps;Seated Heel Slides: AROM;Right;5 reps;Seated   Assessment/Plan    PT Assessment Patient needs continued PT services  PT Problem List Decreased strength;Decreased range of motion;Decreased activity tolerance;Decreased mobility;Decreased balance;Decreased knowledge of use of DME;Decreased knowledge of precautions;Pain       PT Treatment Interventions DME instruction;Gait training;Stair training;Functional mobility training;Therapeutic activities;Therapeutic exercise;Balance training;Patient/family education    PT Goals (Current goals can be found in the Care Plan section)  Acute Rehab PT Goals Patient Stated Goal: return to independence, work, and walking for exercise PT Goal Formulation: With patient Time For Goal Achievement: 07/24/20 Potential to Achieve Goals: Good    Frequency 7X/week   Barriers to discharge           AM-PAC PT "6 Clicks" Mobility  Outcome Measure Help needed turning from your back to your side while in a flat bed without using bedrails?: A Little Help needed moving from lying on your back to sitting on the side of a flat bed without using bedrails?: A Little Help needed moving to and from a bed to a chair (including a wheelchair)?: A Little Help needed standing up from a chair using your arms (e.g., wheelchair or bedside chair)?: A Little Help needed to walk in hospital room?: A Little Help needed climbing 3-5 steps with a railing? : A Lot 6 Click Score: 17    End of Session Equipment Utilized During Treatment: Gait belt Activity Tolerance: Patient tolerated treatment well Patient left: in chair;with call bell/phone within reach;with chair alarm set;with family/visitor present Nurse Communication: Mobility status PT Visit Diagnosis: Muscle weakness (generalized) (M62.81);Difficulty in walking, not elsewhere classified (R26.2)    Time:  3419-6222 PT Time Calculation (min) (ACUTE ONLY): 27 min   Charges:   PT Evaluation $PT Eval Low Complexity: 1 Low PT Treatments $Therapeutic Exercise: 8-22 mins       Wynn Maudlin, DPT Acute Rehabilitation Services  Office [phone] Pager [phone]  07/17/2020 5:13 PM

## 2020-07-17 NOTE — Brief Op Note (Signed)
07/17/2020  11:01 AM  PATIENT:  [name]  64 y.o. female  PRE-OPERATIVE DIAGNOSIS:  right knee osteoarthritis  POST-OPERATIVE DIAGNOSIS:  right knee osteoarthritis  PROCEDURE:  Procedure(s): RIGHT TOTAL KNEE ARTHROPLASTY (Right)  SURGEON:  Surgeon(s) and Role:    Kathryne Hitch, MD - Primary  PHYSICIAN ASSISTANT:  Rexene Edison, PA-C  ANESTHESIA:   local, regional and spinal  EBL:  50 mL   COUNTS:  YES  TOURNIQUET:  * Missing tourniquet times found for documented tourniquets in log: [id number] *  DICTATION: .Other Dictation: Dictation Number [id number]  PLAN OF CARE: Admit for overnight observation  PATIENT DISPOSITION:  PACU - hemodynamically stable.   Delay start of Pharmacological VTE agent (>24hrs) due to surgical blood loss or risk of bleeding: no

## 2020-07-17 NOTE — Transfer of Care (Signed)
Immediate Anesthesia Transfer of Care Note  Patient: Nancy Eaton  Procedure(s) Performed: RIGHT TOTAL KNEE ARTHROPLASTY (Right Knee)  Patient Location: PACU  Anesthesia Type:GA combined with regional for post-op pain  Level of Consciousness: awake  Airway & Oxygen Therapy: Patient Spontanous Breathing and Patient connected to face mask oxygen  Post-op Assessment: Report given to RN and Post -op Vital signs reviewed and stable  Post vital signs: Reviewed and stable  Last Vitals:  Vitals Value Taken Time  BP 137/74 07/17/20 1117  Temp    Pulse 80 07/17/20 1119  Resp 18 07/17/20 1119  SpO2 100 % 07/17/20 1119  Vitals shown include unvalidated device data.  Last Pain:  Vitals:   07/17/20 0653  TempSrc: Oral      Patients Stated Pain Goal: 3 (07/17/20 0700)  Complications: No complications documented.

## 2020-07-17 NOTE — Progress Notes (Signed)
Orthopedic Tech Progress Note Patient Details:  Nancy Eaton 1956/08/02 [id number]  Patient ID: [name], female   DOB: [birth year], 64 y.o.   MRN: [id number]   [name] 07/17/2020, 3:57 PM cpm removed @1600 

## 2020-07-17 NOTE — Op Note (Signed)
NAME: Nancy Eaton, [place] MEDICAL RECORD [id number] ACCOUNT [id number] DATE OF BIRTH:05/29/1956 FACILITY: [facility] LOCATION: [id number] PHYSICIAN:Nahal Wanless Aretha Parrot, MD  OPERATIVE REPORT  DATE OF PROCEDURE:  07/17/2020  PREOPERATIVE DIAGNOSIS:  Primary osteoarthritis and degenerative joint disease, right knee.  POSTOPERATIVE DIAGNOSIS:  Primary osteoarthritis and degenerative joint disease, right knee.  PROCEDURE:  Right total knee arthroplasty.  IMPLANTS:  Stryker Triathlon cemented knee system with size 3 femur, size 2 tibial tray, 10 mm fixed bearing polyethylene insert, size 29 central patellar button.  SURGEON:  Vanita Panda. Magnus Ivan, MD  ASSISTANT:  Richardean Canal, PA-C.  ANESTHESIA: 1.  Right lower extremity adductor canal block. 2.  Spinal. 3.  Local with 0.25% Marcaine with epinephrine around the arthrotomy.  TOURNIQUET TIME:  Under 2 hours.  ESTIMATED BLOOD LOSS:  Less  than 100 mL.  COMPLICATIONS:  None.  INDICATIONS:  The patient is an active 64 year old female with debilitating arthritis involving her right knee.  This has been well documented with clinical exam, x-rays and even an MRI.  She has varus malalignment of the knee with significant loss of  cartilage in the knee.  She has tried and failed all forms of conservative treatment and at this point, her right knee pain is daily and it is detrimentally affecting her mobility, her quality of life and activities of daily living to the point, she does  wish to proceed with a total knee arthroplasty and we have recommended this to her as well.  With this type of surgery, we have explained the risk of acute blood loss anemia, nerve or vessel injury, fracture, infection, DVT, and implant failure.  We  talked about the goals being decreased pain, improved mobility and overall improved quality of life.  DESCRIPTION OF PROCEDURE:  After informed consent was obtained and appropriate right knee was marked.  Anesthesia  obtained an adductor canal block of the right lower extremity in the holding room.  She was then brought to the operating room and sat up on  the operating table where spinal anesthesia was obtained.  She was laid in supine position on the operating table.  Foley catheter was placed and a nonsterile  tourniquet was placed around her upper right thigh.  Her right thigh, knee, leg, and ankle  were prepped and draped with DuraPrep and sterile drapes including a sterile stockinette.  Time-out was called.  She was identified as correct patient, correct right knee.  I then used an Esmarch to wrap that leg and tourniquet was inflated to 300 mm of  pressure.  I then made a direct midline incision with the knee in an extended position over the patella and carried this proximally and distally, dissected down to the knee joint and carried out a medial parapatellar arthrotomy, finding a very large  joint effusion and significant synovitis in the knee.  Once we got the knee in a flexed position, we could see there was significant wear of cartilage mainly in the patellofemoral joint and the medial compartment of the knee.  We removed remnants of ACL,  PCL, medial and lateral meniscus and then used our extramedullary guide for making our proximal tibia cut, taking 9 mm off the high side, and correcting varus and valgus and neutral slope.  We then used an intramedullary guide for the distal femoral  cutting guide, setting this for a right knee at 5 degrees externally rotated.  We made that cut as a 10 mm distal femoral cut and that was done without difficulty.  We brought the knee back down to full extension with a 9 mm extension block and achieved  full extension and we did remove other debris from the back of the knee.  We then went back to the femur again and put our femoral sizing guide based off the epicondylar axis.  Based off this, we chose a size 3 femur.  We put a 4-in-1 cutting block for a  size 3 femur, made our  anterior and posterior cuts, followed by our chamfer cuts.  We then did our femoral box cut next for a size 3 femur.  We then back to the tibia and chose a size 2 tibial tray for coverage, setting rotation off the tibial tubercle  and the femur.  We made our keel punch off of this and placed our 2 tibial tray, followed by our 3 femur. When we were placing the trial polyethylene insert, we noticed that the tibial component kept internally rotating, so that being said, we knew it  was going to be a more difficult case.  We then made our patellar cut and drilled 3 holes for a size 29 patellar button.  I then removed all instrumentation from the knee and we irrigated the knee with normal saline solution.  I placed Marcaine with  epinephrine around the arthrotomy.  I then instructed the staff to do 2 separate batches of cement because I felt it was more appropriate to cement the tibial tray and get this to harden so that it would not rotate again.  We then mixed our cement and  cemented the real size 2 Stryker Triathlon tibial tray and I held that in the appropriate position, external rotation with the alignment of the tibia and the femur, so we could get that hardened down.  Once I felt really good about the rotation, we mixed  a second batch of cement and cemented our size 3 right femoral component.  We then placed our trial 9 polyethylene insert and cemented our patellar button and held the knee in a fully extended position and compressed the knee down with cement, all  eventually hardened.  I then felt that we should go up to a size 10 mm insert for the real insert.  We removed the trial insert and placed the real 10 mm fixed bearing polyethylene insert for a size 2 tibial tray and I was pleased with range of motion  and stability.  We then let the tourniquet down.  Hemostasis obtained with electrocautery.  We closed our arthrotomy with interrupted #1 Vicryl suture, followed by 0 Vicryl to close the deep  tissue and 2-0 Vicryl to close the subcutaneous tissue.  The  skin was closed with staples.  The patient was then taken off the [facility] table and taken to the recovery room in stable condition.  All final counts were correct.  There were no complications noted.  Of note, Rexene Edison, PA-C, assisted during the entire  case and his assistance was crucial for facilitating all aspects of this case.  VN/NUANCE  D:07/17/2020 T:07/17/2020 JOB:[id number]

## 2020-07-17 NOTE — Progress Notes (Signed)
Orthopedic Tech Progress Note Patient Details:  Nancy Eaton [facility] [birth year] [id number]  CPM Right Knee CPM Right Knee: On Right Knee Flexion (Degrees): 60 Right Knee Extension (Degrees): 0  Post Interventions Patient Tolerated: Well Instructions Provided: Care of device, Adjustment of device  [name] 07/17/2020, 12:02 PM

## 2020-07-17 NOTE — Anesthesia Postprocedure Evaluation (Signed)
Anesthesia Post Note  Patient: Waylynn Benefiel  Procedure(s) Performed: RIGHT TOTAL KNEE ARTHROPLASTY (Right Knee)     Patient location during evaluation: PACU Anesthesia Type: Regional and General Level of consciousness: awake Pain management: pain level controlled Vital Signs Assessment: post-procedure vital signs reviewed and stable Respiratory status: spontaneous breathing, nonlabored ventilation, respiratory function stable and patient connected to nasal cannula oxygen Cardiovascular status: blood pressure returned to baseline and stable Postop Assessment: no apparent nausea or vomiting Anesthetic complications: no   No complications documented.  Last Vitals:  Vitals:   07/17/20 1240 07/17/20 1354  BP: (!) 154/76 (!) 146/90  Pulse: 61 (!) 55  Resp: 14 17  Temp: (!) 36.3 C 36.4 C  SpO2: 100% 100%    Last Pain:  Vitals:   07/17/20 1354  TempSrc: Oral  PainSc:                  Jailah Willis P Kristian Hazzard

## 2020-07-17 NOTE — Anesthesia Procedure Notes (Signed)
Anesthesia Regional Block: Adductor canal block   Pre-Anesthetic Checklist: ,, timeout performed, Correct Patient, Correct Site, Correct Laterality, Correct Procedure,, site marked, risks and benefits discussed, Surgical consent,  Pre-op evaluation,  At surgeon's request and post-op pain management  Laterality: Right  Prep: chloraprep       Needles:  Injection technique: Single-shot  Needle Type: Echogenic Stimulator Needle     Needle Length: 10cm  Needle Gauge: 20     Additional Needles:   Procedures:,,,, ultrasound used (permanent image in chart),,,,  Narrative:  Start time: 07/17/2020 7:40 AM End time: 07/17/2020 7:50 AM Injection made incrementally with aspirations every 5 mL.  Performed by: Personally  Anesthesiologist: Leonides Grills, MD  Additional Notes: Functioning IV was confirmed and monitors were applied. A time-out was performed. Hand hygiene and sterile gloves were used. The thigh was placed in a frog-leg position and prepped in a sterile fashion. A 20ga BBraun echogenic stimulator needle was placed using ultrasound guidance.  Negative aspiration and negative test dose prior to incremental administration of local anesthetic. The patient tolerated the procedure well.

## 2020-07-17 NOTE — Interval H&P Note (Signed)
History and Physical Interval Note: The patient understands that she is here today for right total knee arthroplasty to treat the pain from her right knee osteoarthritis.  There has been no acute change in medical status.  See recent H&P.  The risk and benefits of surgery have been explained in detail and informed consent is obtained.  07/17/2020 7:03 AM  Nancy Eaton Nancy Eaton  has presented today for surgery, with the diagnosis of right knee osteoarthritis.  The various methods of treatment have been discussed with the patient and family. After consideration of risks, benefits and other options for treatment, the patient has consented to  Procedure(s): RIGHT TOTAL KNEE ARTHROPLASTY (Right) as a surgical intervention.  The patient's history has been reviewed, patient examined, no change in status, stable for surgery.  I have reviewed the patient's chart and labs.  Questions were answered to the patient's satisfaction.     Kathryne Hitch

## 2020-07-17 NOTE — Anesthesia Procedure Notes (Signed)
Procedure Name: LMA Insertion Date/Time: 07/17/2020 10:38 AM Performed by: Nelle Don, CRNA Pre-anesthesia Checklist: Patient identified, Emergency Drugs available, Suction available and Patient being monitored Patient Re-evaluated:Patient Re-evaluated prior to induction Oxygen Delivery Method: Circle system utilized Preoxygenation: Pre-oxygenation with 100% oxygen Induction Type: IV induction LMA: LMA inserted LMA Size: 4.0 Number of attempts: 1 Dental Injury: Teeth and Oropharynx as per pre-operative assessment

## 2020-07-17 NOTE — Anesthesia Procedure Notes (Signed)
Spinal  Patient location during procedure: OR Start time: 07/17/2020 8:35 AM End time: 07/17/2020 8:40 AM Staffing Performed: anesthesiologist  Anesthesiologist: Leonides Grills, MD Preanesthetic Checklist Completed: patient identified, IV checked, risks and benefits discussed, surgical consent, monitors and equipment checked, pre-op evaluation and timeout performed Spinal Block Patient position: sitting Prep: DuraPrep Patient monitoring: cardiac monitor, continuous pulse ox and blood pressure Approach: midline Location: L4-5 Injection technique: single-shot Needle Needle type: Pencan  Needle gauge: 24 G Needle length: 9 cm Assessment Sensory level: T10 Additional Notes Functioning IV was confirmed and monitors were applied. Sterile prep and drape, including hand hygiene and sterile gloves were used. The patient was positioned and the spine was prepped. The skin was anesthetized with lidocaine.  Free flow of clear CSF was obtained prior to injecting local anesthetic into the CSF.  The spinal needle aspirated freely following injection.  The needle was carefully withdrawn.  The patient tolerated the procedure well.

## 2020-07-18 ENCOUNTER — Other Ambulatory Visit: Payer: Self-pay | Admitting: Orthopaedic Surgery

## 2020-07-18 DIAGNOSIS — M1711 Unilateral primary osteoarthritis, right knee: Secondary | ICD-10-CM | POA: Diagnosis not present

## 2020-07-18 LAB — CBC
HCT: 37.3 % (ref 36.0–46.0)
Hemoglobin: 12.5 g/dL (ref 12.0–15.0)
MCH: 30 pg (ref 26.0–34.0)
MCHC: 33.5 g/dL (ref 30.0–36.0)
MCV: 89.4 fL (ref 80.0–100.0)
Platelets: 227 10*3/uL (ref 150–400)
RBC: 4.17 MIL/uL (ref 3.87–5.11)
RDW: 12.7 % (ref 11.5–15.5)
WBC: 16.2 10*3/uL — ABNORMAL HIGH (ref 4.0–10.5)
nRBC: 0 % (ref 0.0–0.2)

## 2020-07-18 LAB — BASIC METABOLIC PANEL
Anion gap: 9 (ref 5–15)
BUN: 14 mg/dL (ref 8–23)
CO2: 23 mmol/L (ref 22–32)
Calcium: 8.7 mg/dL — ABNORMAL LOW (ref 8.9–10.3)
Chloride: 106 mmol/L (ref 98–111)
Creatinine, Ser: 0.6 mg/dL (ref 0.44–1.00)
GFR calc Af Amer: >60 mL/min (ref >60)
GFR calc non Af Amer: >60 mL/min (ref >60)
Glucose, Bld: 141 mg/dL — ABNORMAL HIGH (ref 70–99)
Potassium: 4.3 mmol/L (ref 3.5–5.1)
Sodium: 138 mmol/L (ref 135–145)

## 2020-07-18 MED ORDER — METHOCARBAMOL 500 MG PO TABS: 500.0000 mg | ORAL_TABLET | Freq: Four times a day (QID) | ORAL | 1 refills | Status: DC | PRN

## 2020-07-18 MED ORDER — OXYCODONE HCL 5 MG PO TABS: 5.0000 mg | ORAL_TABLET | ORAL | 0 refills | Status: DC | PRN

## 2020-07-18 MED ORDER — ASPIRIN 81 MG PO CHEW
81.0000 mg | CHEWABLE_TABLET | Freq: Two times a day (BID) | ORAL | 0 refills | Status: DC
Start: 2020-07-18 — End: 2024-02-05

## 2020-07-18 MED ORDER — SODIUM CHLORIDE 0.9 % IV BOLUS
500.0000 mL | Freq: Once | INTRAVENOUS | Status: AC
  Administered 2020-07-18: 500 mL via INTRAVENOUS

## 2020-07-18 MED ORDER — OXYCODONE HCL 5 MG PO TABS: 5.0000 mg | ORAL_TABLET | Freq: Four times a day (QID) | ORAL | 0 refills | Status: DC | PRN

## 2020-07-18 NOTE — Progress Notes (Signed)
Subjective: 1 Day Post-Op Procedure(s) (LRB): RIGHT TOTAL KNEE ARTHROPLASTY (Right) Patient reports pain as moderate.    Objective: Vital signs in last 24 hours: Temp:  [97.4 F (36.3 C)-98.1 F (36.7 C)] 98.1 F (36.7 C) (08/28 0550) Pulse Rate:  [55-81] 80 (08/28 0550) Resp:  [11-17] 16 (08/28 0550) BP: (120-154)/(66-90) 132/72 (08/28 0550) SpO2:  [97 %-100 %] 98 % (08/28 0550) Weight:  [84.6 kg] 84.6 kg (08/27 1240)  Intake/Output from previous day: 08/27 0701 - 08/28 0700 In: 2648.4 [P.O.:600; I.V.:1898.4; IV Piggyback:150] Out: 2500 [Urine:2450; Blood:50] Intake/Output this shift: No intake/output data recorded.  Recent Labs    07/18/20 0350  HGB 12.5   Recent Labs    07/18/20 0350  WBC 16.2*  RBC 4.17  HCT 37.3  PLT 227   Recent Labs    07/18/20 0350  NA 138  K 4.3  CL 106  CO2 23  BUN 14  CREATININE 0.60  GLUCOSE 141*  CALCIUM 8.7*   No results for input(s): LABPT, INR in the last 72 hours.  Sensation intact distally Intact pulses distally Dorsiflexion/Plantar flexion intact Incision: dressing C/D/I No cellulitis present Compartment soft   Assessment/Plan: 1 Day Post-Op Procedure(s) (LRB): RIGHT TOTAL KNEE ARTHROPLASTY (Right) Up with therapy Discharge home with home health    Patient's anticipated LOS is less than 2 midnights, meeting these requirements: - Younger than 58 - Lives within 1 hour of care - Has a competent adult at home to recover with post-op recover - NO history of  - Chronic pain requiring opiods  - Diabetes  - Coronary Artery Disease  - Heart failure  - Heart attack  - Stroke  - DVT/VTE  - Cardiac arrhythmia  - Respiratory Failure/COPD  - Renal failure  - Anemia  - Advanced Liver disease       Kathryne Hitch 07/18/2020, 9:31 AM

## 2020-07-18 NOTE — TOC Initial Note (Addendum)
Transition of Care [facility]) - Initial/Assessment Note    Patient Details  Name: Nancy Eaton MRN: [id number] Date of Birth: [birth year]  Transition [facility]) CM/SW Contact:    Armanda Heritage, RN Phone Number: 07/18/2020, 11:12 AM  Clinical Narrative:       CM spoke with patient at bedside.  Patient set up with [facility] for HHPT. Reports has rolling walker and 3in1 at home.  Patient is going to stay with her cousin at [address] in [place], [place] [address].            Expected Discharge Plan: Home w Home Health Services Barriers to Discharge: No Barriers Identified   Patient Goals and CMS Choice Patient states their goals for this hospitalization and ongoing recovery are:: to go home with therapy CMS Medicare.gov Compare Post Acute Care list provided to:: Patient Choice offered to / list presented to : Patient  Expected Discharge Plan and Services Expected Discharge Plan: Home w Home Health Services   Discharge Planning Services: CM Consult Post Acute Care Choice: Home Health Living arrangements for the past 2 months: Single Family Home Expected Discharge Date: 07/18/20               DME Arranged: N/A DME Agency: NA       HH Arranged: PT HH Agency: [facility]     Representative spoke with at [facility] Agency: pre arranged in md office  Prior Living Arrangements/Services Living arrangements for the past 2 months: Single Family Home   Patient language and need for interpreter reviewed:: Yes Do you feel safe going back to the place where you live?: Yes      Need for Family Participation in Patient Care: Yes (Comment) Care giver support system in place?: Yes (comment)   Criminal Activity/Legal Involvement Pertinent to Current Situation/Hospitalization: No - Comment as needed  Activities of Daily Living Home Assistive Devices/Equipment: Eyeglasses, Built-in shower seat, Hand-held shower hose, Cane (specify quad or straight),  Crutches ADL Screening (condition at time of admission) Patient's cognitive ability adequate to safely complete daily activities?: Yes Is the patient deaf or have difficulty hearing?: No Does the patient have difficulty seeing, even when wearing glasses/contacts?: No Does the patient have difficulty concentrating, remembering, or making decisions?: No Patient able to express need for assistance with ADLs?: Yes Does the patient have difficulty dressing or bathing?: No Independently performs ADLs?: Yes (appropriate for developmental age) Does the patient have difficulty walking or climbing stairs?: Yes Weakness of Legs: None Weakness of Arms/Hands: None  Permission Sought/Granted                  Emotional Assessment Appearance:: Appears stated age Attitude/Demeanor/Rapport: Engaged Affect (typically observed): Accepting Orientation: : Oriented to Self, Oriented to Place, Oriented to  Time, Oriented to Situation   Psych Involvement: No (comment)  Admission diagnosis:  Status post total right knee replacement [Z96.651] Patient Active Problem List   Diagnosis Date Noted  . Status post total right knee replacement 07/17/2020  . Unilateral primary osteoarthritis, right knee 12/30/2019  . Acute right-sided low back pain with right-sided sciatica 12/25/2017  . Health maintenance examination 09/30/2013  . Hyperlipidemia 09/30/2013   PCP:  Drosinis, Leonia Reader, PA-C Pharmacy:   [facility] [address], [place] - [address] [place] [address] Phone: [phone] Fax: [phone]     Social Determinants of Health (SDOH) Interventions    Readmission Risk Interventions  No flowsheet data found.

## 2020-07-18 NOTE — Discharge Instructions (Signed)
INSTRUCTIONS AFTER JOINT REPLACEMENT   o Remove items at home which could result in a fall. This includes throw rugs or furniture in walking pathways o ICE to the affected joint every three hours while awake for 30 minutes at a time, for at least the first 3-5 days, and then as needed for pain and swelling.  Continue to use ice for pain and swelling. You may notice swelling that will progress down to the foot and ankle.  This is normal after surgery.  Elevate your leg when you are not up walking on it.   o Continue to use the breathing machine you got in the hospital (incentive spirometer) which will help keep your temperature down.  It is common for your temperature to cycle up and down following surgery, especially at night when you are not up moving around and exerting yourself.  The breathing machine keeps your lungs expanded and your temperature down.   DIET:  As you were doing prior to hospitalization, we recommend a well-balanced diet.  DRESSING / WOUND CARE / SHOWERING  Keep the surgical dressing until follow up.  The dressing is water proof, so you can shower without any extra covering.  IF THE DRESSING FALLS OFF or the wound gets wet inside, change the dressing with sterile gauze.  Please use good hand washing techniques before changing the dressing.  Do not use any lotions or creams on the incision until instructed by your surgeon.    ACTIVITY  o Increase activity slowly as tolerated, but follow the weight bearing instructions below.   o No driving for 6 weeks or until further direction given by your physician.  You cannot drive while taking narcotics.  o No lifting or carrying greater than 10 lbs. until further directed by your surgeon. o Avoid periods of inactivity such as sitting longer than an hour when not asleep. This helps prevent blood clots.  o You may return to work once you are authorized by your doctor.     WEIGHT BEARING   Weight bearing as tolerated with assist  device (walker, cane, etc) as directed, use it as long as suggested by your surgeon or therapist, typically at least 4-6 weeks.   EXERCISES  Results after joint replacement surgery are often greatly improved when you follow the exercise, range of motion and muscle strengthening exercises prescribed by your doctor. Safety measures are also important to protect the joint from further injury. Any time any of these exercises cause you to have increased pain or swelling, decrease what you are doing until you are comfortable again and then slowly increase them. If you have problems or questions, call your caregiver or physical therapist for advice.   Rehabilitation is important following a joint replacement. After just a few days of immobilization, the muscles of the leg can become weakened and shrink (atrophy).  These exercises are designed to build up the tone and strength of the thigh and leg muscles and to improve motion. Often times heat used for twenty to thirty minutes before working out will loosen up your tissues and help with improving the range of motion but do not use heat for the first two weeks following surgery (sometimes heat can increase post-operative swelling).   These exercises can be done on a training (exercise) mat, on the floor, on a table or on a bed. Use whatever works the best and is most comfortable for you.    Use music or television while you are exercising so that   the exercises are a pleasant break in your day. This will make your life better with the exercises acting as a break in your routine that you can look forward to.   Perform all exercises about fifteen times, three times per day or as directed.  You should exercise both the operative leg and the other leg as well.  Exercises include:   . Quad Sets - Tighten up the muscle on the front of the thigh (Quad) and hold for 5-10 seconds.   . Straight Leg Raises - With your knee straight (if you were given a brace, keep it on),  lift the leg to 60 degrees, hold for 3 seconds, and slowly lower the leg.  Perform this exercise against resistance later as your leg gets stronger.  . Leg Slides: Lying on your back, slowly slide your foot toward your buttocks, bending your knee up off the floor (only go as far as is comfortable). Then slowly slide your foot back down until your leg is flat on the floor again.  . Angel Wings: Lying on your back spread your legs to the side as far apart as you can without causing discomfort.  . Hamstring Strength:  Lying on your back, push your heel against the floor with your leg straight by tightening up the muscles of your buttocks.  Repeat, but this time bend your knee to a comfortable angle, and push your heel against the floor.  You may put a pillow under the heel to make it more comfortable if necessary.   A rehabilitation program following joint replacement surgery can speed recovery and prevent re-injury in the future due to weakened muscles. Contact your doctor or a physical therapist for more information on knee rehabilitation.    CONSTIPATION  Constipation is defined medically as fewer than three stools per week and severe constipation as less than one stool per week.  Even if you have a regular bowel pattern at home, your normal regimen is likely to be disrupted due to multiple reasons following surgery.  Combination of anesthesia, postoperative narcotics, change in appetite and fluid intake all can affect your bowels.   YOU MUST use at least one of the following options; they are listed in order of increasing strength to get the job done.  They are all available over the counter, and you may need to use some, POSSIBLY even all of these options:    Drink plenty of fluids (prune juice may be helpful) and high fiber foods Colace 100 mg by mouth twice a day  Senokot for constipation as directed and as needed Dulcolax (bisacodyl), take with full glass of water  Miralax (polyethylene glycol)  once or twice a day as needed.  If you have tried all these things and are unable to have a bowel movement in the first 3-4 days after surgery call either your surgeon or your primary doctor.    If you experience loose stools or diarrhea, hold the medications until you stool forms back up.  If your symptoms do not get better within 1 week or if they get worse, check with your doctor.  If you experience "the worst abdominal pain ever" or develop nausea or vomiting, please contact the office immediately for further recommendations for treatment.   ITCHING:  If you experience itching with your medications, try taking only a single pain pill, or even half a pain pill at a time.  You can also use Benadryl over the counter for itching or also to   help with sleep.   TED HOSE STOCKINGS:  Use stockings on both legs until for at least 2 weeks or as directed by physician office. They may be removed at night for sleeping.  MEDICATIONS:  See your medication summary on the "After Visit Summary" that nursing will review with you.  You may have some home medications which will be placed on hold until you complete the course of blood thinner medication.  It is important for you to complete the blood thinner medication as prescribed.  PRECAUTIONS:  If you experience chest pain or shortness of breath - call 911 immediately for transfer to the hospital emergency department.   If you develop a fever greater that 101 F, purulent drainage from wound, increased redness or drainage from wound, foul odor from the wound/dressing, or calf pain - CONTACT YOUR SURGEON.                                                   FOLLOW-UP APPOINTMENTS:  If you do not already have a post-op appointment, please call the office for an appointment to be seen by your surgeon.  Guidelines for how soon to be seen are listed in your "After Visit Summary", but are typically between 1-4 weeks after surgery.  OTHER INSTRUCTIONS:   Knee  Replacement:  Do not place pillow under knee, focus on keeping the knee straight while resting. CPM instructions: 0-90 degrees, 2 hours in the morning, 2 hours in the afternoon, and 2 hours in the evening. Place foam block, curve side up under heel at all times except when in CPM or when walking.  DO NOT modify, tear, cut, or change the foam block in any way.   DENTAL ANTIBIOTICS:  In most cases prophylactic antibiotics for Dental procdeures after total joint surgery are not necessary.  Exceptions are as follows:  1. History of prior total joint infection  2. Severely immunocompromised (Organ Transplant, cancer chemotherapy, Rheumatoid biologic meds such as Humera)  3. Poorly controlled diabetes (A1C &gt; 8.0, blood glucose over 200)  If you have one of these conditions, contact your surgeon for an antibiotic prescription, prior to your dental procedure.   MAKE SURE YOU: Dental Antibiotics:  In most cases prophylactic antibiotics for Dental procdeures after total joint surgery are not necessary.  Exceptions are as follows:  1. History of prior total joint infection  2. Severely immunocompromised (Organ Transplant, cancer chemotherapy, Rheumatoid biologic meds such as Humera)  3. Poorly controlled diabetes (A1C &gt; 8.0, blood glucose over 200)  If you have one of these conditions, contact your surgeon for an antibiotic prescription, prior to your dental procedure. . Understand these instructions.  . Get help right away if you are not doing well or get worse.    Thank you for letting us be a part of your medical care team.  It is a privilege we respect greatly.  We hope these instructions will help you stay on track for a fast and full recovery!

## 2020-07-18 NOTE — Plan of Care (Signed)
Plan of care reviewed and discussed with the patient. 

## 2020-07-18 NOTE — Progress Notes (Signed)
Physical Therapy Treatment Patient Details Name: Nancy Eaton MRN: [id number] DOB: 04-Nov-1956 Today's Date: 07/18/2020    History of Present Illness Patient is 64 y.o. female s/p Rt TKA on 07/17/20 with PMH significant for HLD, OA.    PT Comments    Progressing well with mobility. Reviewed exercises, gait training, and stair training. All education completed. Okay to d/c from PT standpoint.    Follow Up Recommendations  Follow surgeon's recommendation for DC plan and follow-up therapies     Equipment Recommendations  None recommended by PT    Recommendations for Other Services       Precautions / Restrictions Precautions Precautions: Fall Restrictions Weight Bearing Restrictions: No Other Position/Activity Restrictions: WBAT    Mobility  Bed Mobility   Bed Mobility: Supine to Sit          General bed mobility comments: oob in recliner  Transfers Overall transfer level: Needs assistance Equipment used: Rolling walker (2 wheeled) Transfers: Sit to/from Stand Sit to Stand: Min guard         General transfer comment: VCs safety, hand placement.  Ambulation/Gait Ambulation/Gait assistance: Min guard Gait Distance (Feet): 50 Feet Assistive device: Rolling walker (2 wheeled) Gait Pattern/deviations: Step-to pattern;Step-through pattern;Decreased stride length     General Gait Details: Cues for safety, sequence, RW proximity, posture.   Stairs Stairs: Yes Stairs assistance: Min guard Stair Management: Step to pattern;Forwards;With walker Number of Stairs: 1 General stair comments: VCs safety, technique, sequence. Min guard for safety   Wheelchair Mobility    Modified Rankin (Stroke Patients Only)       Balance Overall balance assessment: Needs assistance         Standing balance support: Bilateral upper extremity supported Standing balance-Leahy Scale: Fair                              Cognition Arousal/Alertness:  Awake/alert Behavior During Therapy: WFL for tasks assessed/performed Overall Cognitive Status: Within Functional Limits for tasks assessed                                        Exercises Total Joint Exercises Ankle Circles/Pumps: AROM;Both;10 reps Quad Sets: AROM;Both;10 reps Heel Slides: AAROM;Right;10 reps Hip ABduction/ADduction: AAROM;Right;10 reps Straight Leg Raises: AAROM;Right;10 reps Goniometric ROM: ~10-60 degrees    General Comments        Pertinent Vitals/Pain Pain Assessment: 0-10 Pain Score: 5  Pain Location: Rt knee Pain Descriptors / Indicators: Discomfort;Sore;Aching Pain Intervention(s): Monitored during session    Home Living                      Prior Function            PT Goals (current goals can now be found in the care plan section) Progress towards PT goals: Progressing toward goals    Frequency    7X/week      PT Plan Current plan remains appropriate    Co-evaluation              AM-PAC PT "6 Clicks" Mobility   Outcome Measure  Help needed turning from your back to your side while in a flat bed without using bedrails?: A Little Help needed moving from lying on your back to sitting on the side of a flat bed without using bedrails?: A Little Help needed  moving to and from a bed to a chair (including a wheelchair)?: A Little Help needed standing up from a chair using your arms (e.g., wheelchair or bedside chair)?: A Little Help needed to walk in hospital room?: A Little Help needed climbing 3-5 steps with a railing? : A Little 6 Click Score: 18    End of Session Equipment Utilized During Treatment: Gait belt Activity Tolerance: Patient tolerated treatment well Patient left: with call bell/phone within reach (in bathroom with caregiver. Made NT aware.) Nurse Communication:  (made NT aware that pt was in bathroom with caregiver) PT Visit Diagnosis: Other abnormalities of gait and mobility  (R26.89);Pain Pain - Right/Left: Right Pain - part of body: Knee     Time: 6213-0865 PT Time Calculation (min) (ACUTE ONLY): 13 min  Charges:  $Gait Training: 8-22 mins                         Faye Ramsay, PT Acute Rehabilitation  Office: [phone] Pager: [phone]

## 2020-07-18 NOTE — Discharge Summary (Signed)
Patient ID: Nancy Eaton MRN: [id number] DOB/AGE: Jun 05, 1956 64 y.o.  Admit date: 07/17/2020 Discharge date: 07/18/2020  Admission Diagnoses:  Principal Problem:   Unilateral primary osteoarthritis, right knee Active Problems:   Status post total right knee replacement   Discharge Diagnoses:  Same  Past Medical History:  Diagnosis Date  . Arthritis    Knee  . Hyperlipidemia    Lovastatin started approx 2011  . IFG (impaired fasting glucose) 2018   HbA1c 5.9% (stable)  . PONV (postoperative nausea and vomiting)   . Pre-diabetes   . Right knee pain 12/2019   End stage: TKA recommended by ortho 01/2020->pt considering  . Sigmoid diverticulosis 04/2010   "     "     "    "    Surgeries: Procedure(s): RIGHT TOTAL KNEE ARTHROPLASTY on 07/17/2020   Consultants:   Discharged Condition: Improved  Hospital Course: Nancy Eaton is an 64 y.o. female who was admitted 07/17/2020 for operative treatment ofUnilateral primary osteoarthritis, right knee. Patient has severe unremitting pain that affects sleep, daily activities, and work/hobbies. After pre-op clearance the patient was taken to the operating room on 07/17/2020 and underwent  Procedure(s): RIGHT TOTAL KNEE ARTHROPLASTY.    Patient was given perioperative antibiotics:  Anti-infectives (From admission, onward)   Start     Dose/Rate Route Frequency Ordered Stop   07/17/20 1500  clindamycin (CLEOCIN) IVPB 600 mg        600 mg 100 mL/hr over 30 Minutes Intravenous Every 6 hours 07/17/20 1254 07/17/20 2147   07/17/20 0645  clindamycin (CLEOCIN) IVPB 900 mg        900 mg 100 mL/hr over 30 Minutes Intravenous On call to O.R. 07/17/20 0636 07/17/20 0915       Patient was given sequential compression devices, early ambulation, and chemoprophylaxis to prevent DVT.  Patient benefited maximally from hospital stay and there were no complications.    Recent vital signs:  Patient Vitals for the past 24 hrs:  BP Temp Temp  src Pulse Resp SpO2 Height Weight  07/18/20 0550 132/72 98.1 F (36.7 C) Oral 80 16 98 % -- --  07/18/20 0202 120/72 (!) 97.5 F (36.4 C) Oral 67 15 98 % -- --  07/17/20 2141 120/66 (!) 97.4 F (36.3 C) Oral 71 16 97 % -- --  07/17/20 1724 131/74 -- -- 69 17 98 % -- --  07/17/20 1525 (!) 142/78 -- -- 70 14 98 % -- --  07/17/20 1354 (!) 146/90 97.6 F (36.4 C) Oral (!) 55 17 100 % -- --  07/17/20 1240 (!) 154/76 (!) 97.4 F (36.3 C) Oral 61 14 100 % 5\' 3"  (1.6 m) 84.6 kg  07/17/20 1215 (!) 141/87 -- -- 67 15 97 % -- --  07/17/20 1200 138/82 -- -- 64 11 100 % -- --  07/17/20 1145 (!) 143/74 -- -- 64 11 100 % -- --  07/17/20 1130 126/72 -- -- 71 15 99 % -- --  07/17/20 1117 137/74 97.6 F (36.4 C) -- 81 17 100 % -- --     Recent laboratory studies:  Recent Labs    07/18/20 0350  WBC 16.2*  HGB 12.5  HCT 37.3  PLT 227  NA 138  K 4.3  CL 106  CO2 23  BUN 14  CREATININE 0.60  GLUCOSE 141*  CALCIUM 8.7*     Discharge Medications:   Allergies as of 07/18/2020      Reactions  Penicillins Rash   20 years       Medication List    TAKE these medications   acetaminophen 500 MG tablet Commonly known as: TYLENOL Take 500 mg by mouth at bedtime.   aspirin 81 MG chewable tablet Chew 1 tablet (81 mg total) by mouth 2 (two) times daily.   atorvastatin 40 MG tablet Commonly known as: LIPITOR Take 1 tablet (40 mg total) by mouth daily.   CALCIUM-VITAMIN D PO Take 2 tablets by mouth daily.   COLLAGEN PO Take 6,000 mg by mouth 4 (four) times daily. Biotin   FIBER-CAPS PO Take 5 g by mouth in the morning and at bedtime.   FISH OIL PO Take 1,400 mg by mouth daily. Omega3 980 mg   GLUCOSAMINE-MSM PO Take 1 tablet by mouth daily.   ibuprofen 200 MG tablet Commonly known as: ADVIL Take 200 mg by mouth every 6 (six) hours as needed for headache or moderate pain.   methocarbamol 500 MG tablet Commonly known as: ROBAXIN Take 1 tablet (500 mg total) by mouth every 6  (six) hours as needed for muscle spasms.   multivitamin with minerals tablet Take 1 tablet by mouth daily. Women 50+   PreserVision AREDS 2 Caps Take 1 tablet by mouth daily.   oxyCODONE 5 MG immediate release tablet Commonly known as: Oxy IR/ROXICODONE Take 1-2 tablets (5-10 mg total) by mouth every 4 (four) hours as needed for moderate pain (pain score 4-6).   Turmeric 500 MG Caps Take 1,500 mg by mouth in the morning, at noon, and at bedtime.            Durable Medical Equipment  (From admission, onward)         Start     Ordered   07/17/20 1255  DME 3 n 1  Once        07/17/20 1254   07/17/20 1255  DME Walker rolling  Once       Question Answer Comment  Walker: With 5 Inch Wheels   Patient needs a walker to treat with the following condition Status post total right knee replacement      07/17/20 1254          Diagnostic Studies: DG Knee Right Port  Result Date: 07/17/2020 CLINICAL DATA:  Total right knee replacement. EXAM: PORTABLE RIGHT KNEE - 1-2 VIEW COMPARISON:  12/30/2019. FINDINGS: Total right knee replacement. Hardware intact. Anatomic alignment. Diffuse soft tissue air noted consistent with prior surgery. IMPRESSION: Total right knee replacement with anatomic alignment. Electronically Signed   By: Maisie Fus  Register   On: 07/17/2020 11:50    Disposition: Discharge disposition: 01-Home or Self Care            Signed: Kathryne Hitch 07/18/2020, 9:33 AM

## 2020-07-18 NOTE — Progress Notes (Signed)
Physical Therapy Treatment Patient Details Name: Nancy Eaton MRN: [id number] DOB: 1956-04-27 Today's Date: 07/18/2020    History of Present Illness Patient is 64 y.o. female s/p Rt TKA on 07/17/20 with PMH significant for HLD, OA.    PT Comments    Progressing with mobility. Will plan to have a 2nd session prior to d/c home later today.    Follow Up Recommendations  Follow surgeons recommendation for DC plan and follow-up therapies     Equipment Recommendations  None recommended by PT    Recommendations for Other Services       Precautions / Restrictions Precautions Precautions: Fall Restrictions Weight Bearing Restrictions: No Other Position/Activity Restrictions: WBAT    Mobility  Bed Mobility   Bed Mobility: Supine to Sit     Supine to sit: Min assist     General bed mobility comments: Assist for R LE  Transfers Overall transfer level: Needs assistance Equipment used: Rolling walker (2 wheeled) Transfers: Sit to/from Stand Sit to Stand: Min guard         General transfer comment: VCs safety, hand placement.  Ambulation/Gait Ambulation/Gait assistance: Min guard Gait Distance (Feet): 85 Feet Assistive device: Rolling walker (2 wheeled) Gait Pattern/deviations: Step-to pattern;Step-through pattern;Decreased stride length     General Gait Details: Cues for safety, sequence, RW proximity, posture. Pt beginning to use step through pattern. C/o mild lightheadedness that resolved as distance increased.   Stairs             Wheelchair Mobility    Modified Rankin (Stroke Patients Only)       Balance           Standing balance support: Bilateral upper extremity supported Standing balance-Leahy Scale: Fair                              Cognition Arousal/Alertness: Awake/alert Behavior During Therapy: WFL for tasks assessed/performed Overall Cognitive Status: Within Functional Limits for tasks assessed                                         Exercises      General Comments        Pertinent Vitals/Pain Pain Assessment: 0-10 Pain Score: 5  Pain Location: Rt knee Pain Descriptors / Indicators: Discomfort;Sore;Aching Pain Intervention(s): Monitored during session;Repositioned;Ice applied    Home Living                      Prior Function            PT Goals (current goals can now be found in the care plan section) Progress towards PT goals: Progressing toward goals    Frequency    7X/week      PT Plan Current plan remains appropriate    Co-evaluation              AM-PAC PT "6 Clicks" Mobility   Outcome Measure  Help needed turning from your back to your side while in a flat bed without using bedrails?: A Little Help needed moving from lying on your back to sitting on the side of a flat bed without using bedrails?: A Little Help needed moving to and from a bed to a chair (including a wheelchair)?: A Little Help needed standing up from a chair using your arms (e.g., wheelchair or bedside chair)?:  A Little Help needed to walk in hospital room?: A Little Help needed climbing 3-5 steps with a railing? : A Little 6 Click Score: 18    End of Session Equipment Utilized During Treatment: Gait belt Activity Tolerance: Patient tolerated treatment well Patient left: in chair;with call bell/phone within reach;with chair alarm set   PT Visit Diagnosis: Other abnormalities of gait and mobility (R26.89);Pain Pain - Right/Left: Right Pain - part of body: Knee     Time: 1021-1173 PT Time Calculation (min) (ACUTE ONLY): 19 min  Charges:  $Gait Training: 8-22 mins                         Faye Ramsay, PT Acute Rehabilitation  Office: [phone] Pager: [phone]

## 2020-07-18 NOTE — Progress Notes (Signed)
Orthopedic Tech Progress Note Patient Details:  Nancy Eaton 11/28/1955 [id number] Patient being discharged pick up cpm. Patient ID: Nancy Eaton, female   DOB: May 10, 1956, 64 y.o.   MRN: [id number]   Nancy Eaton 07/18/2020, 2:16 PM

## 2020-07-19 NOTE — Progress Notes (Addendum)
Around 1430, while pt was up in the room she felt dizzy. Pt was returned to chair.  BP was low.  Dr Magnus Ivan notified. Bolus ordered.  Pt can stay the night or if she feels better later can go home today.  At time time she has been up in the room with assistance and feels good.  Would like to go home today.

## 2020-07-21 ENCOUNTER — Encounter (HOSPITAL_COMMUNITY): Payer: Self-pay | Admitting: Orthopaedic Surgery

## 2020-07-30 ENCOUNTER — Encounter: Payer: Self-pay | Admitting: Orthopaedic Surgery

## 2020-07-30 ENCOUNTER — Ambulatory Visit (INDEPENDENT_AMBULATORY_CARE_PROVIDER_SITE_OTHER): Payer: Medicare Other | Admitting: Orthopaedic Surgery

## 2020-07-30 ENCOUNTER — Other Ambulatory Visit: Payer: Self-pay

## 2020-07-30 DIAGNOSIS — Z96651 Presence of right artificial knee joint: Secondary | ICD-10-CM

## 2020-07-30 MED ORDER — OXYCODONE HCL 5 MG PO TABS: 5.0000 mg | ORAL_TABLET | Freq: Four times a day (QID) | ORAL | 0 refills | Status: DC | PRN

## 2020-07-30 NOTE — Progress Notes (Signed)
The patient is 2 weeks tomorrow status post a right total knee arthroplasty.  She is a very active 64 year old female.  Notes from therapy say that they have only been able to flex her to about 60 degrees.  On examination her calf is soft on the right side.  Her incision looks good to remove the staples in place Steri-Strips.  She has almost full extension but her flexion is certainly limited.  I stressed the importance about outpatient physical therapy.  We will work on getting this set up for to work on balance and coordination as well as strengthening and range of motion the right knee.  We will keep her on oxycodone as well.  She can stop her aspirin.  I recommended also alternating Aleve or ibuprofen with the narcotics.  All questions and concerns were answered and addressed.  We will see her back in 4 weeks to see how she is doing overall.

## 2020-07-31 ENCOUNTER — Telehealth: Payer: Self-pay | Admitting: Orthopaedic Surgery

## 2020-07-31 NOTE — Telephone Encounter (Signed)
Received call from [facility] (PT) with [facility] advised patient is being discharged from [facility] today and need to confirm if patient is set up for outpatient rehab. The number to contact patient is [phone].  Flore asked that we  will contact  the patient and confirm with her. The number to contact Flore is [phone]

## 2020-08-03 NOTE — Telephone Encounter (Signed)
LMOM for patient making sure she knew she has several appts already set up for PT at [facility]

## 2020-08-03 NOTE — Telephone Encounter (Signed)
Patient is scheduled at [facility]

## 2020-08-04 ENCOUNTER — Other Ambulatory Visit: Payer: Self-pay

## 2020-08-04 ENCOUNTER — Encounter: Payer: Self-pay | Admitting: Physical Therapy

## 2020-08-04 ENCOUNTER — Ambulatory Visit: Payer: Medicare Other | Attending: Orthopaedic Surgery | Admitting: Physical Therapy

## 2020-08-04 DIAGNOSIS — M25661 Stiffness of right knee, not elsewhere classified: Secondary | ICD-10-CM | POA: Diagnosis present

## 2020-08-04 DIAGNOSIS — M25561 Pain in right knee: Secondary | ICD-10-CM

## 2020-08-04 DIAGNOSIS — R600 Localized edema: Secondary | ICD-10-CM | POA: Diagnosis present

## 2020-08-04 DIAGNOSIS — R262 Difficulty in walking, not elsewhere classified: Secondary | ICD-10-CM | POA: Diagnosis present

## 2020-08-04 NOTE — Therapy (Signed)
[facility] [place] [address] [place], [place], [address] Phone: [phone]   Fax:  [phone]  Physical Therapy Evaluation  Patient Details  Name: Nancy Eaton MRN: [id number] Date of Birth: 11-06-1956 Referring Provider (PT): Magnus Ivan   Encounter Date: 08/04/2020   PT End of Session - 08/04/20 1030    Visit Number 1    Date for PT Re-Evaluation 10/04/20    PT Start Time 0930    PT Stop Time 1020    PT Time Calculation (min) 50 min    Activity Tolerance Patient tolerated treatment well    Behavior During Therapy [facility] for tasks assessed/performed           Past Medical History:  Diagnosis Date  . Arthritis    Knee  . Hyperlipidemia    Lovastatin started approx 2011  . IFG (impaired fasting glucose) 2018   HbA1c 5.9% (stable)  . PONV (postoperative nausea and vomiting)   . Pre-diabetes   . Right knee pain 12/2019   End stage: TKA recommended by ortho 01/2020->pt considering  . Sigmoid diverticulosis 04/2010   "     "     "    "    Past Surgical History:  Procedure Laterality Date  . BREAST ENHANCEMENT SURGERY     As of mammogram 12/2017--rupture of implants stable.  Marland Kitchen CARPAL TUNNEL RELEASE    . CESAREAN SECTION    . COLONOSCOPY  age 64   [facility]--recall 10 yrs  . TOTAL KNEE ARTHROPLASTY Right 07/17/2020   Procedure: RIGHT TOTAL KNEE ARTHROPLASTY;  Surgeon: Kathryne Hitch, MD;  Location: [facility];  Service: Orthopedics;  Laterality: Right;    There were no vitals filed for this visit.    Subjective Assessment - 08/04/20 0925    Subjective Pt reports to clinic after R TKA 07/17/2020. Pt reports that she has been receiving HH PT for the past few weeks. States she has been doing much better getting in and out of bed over the past few days. Pt states she has been icing and elevating knee. 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 Yes    Pain Score 3     Pain Location Knee    Pain Orientation Right    Pain Descriptors / Indicators Shooting;Aching;Throbbing    Pain Type Surgical pain    Pain Onset 1 to 4 weeks ago    Pain Frequency Intermittent    Aggravating Factors  standing, walking, bending knee    Pain Relieving Factors ice, rest              [facility] PT Assessment - 08/04/20 0001      Assessment   Medical Diagnosis R TKA    Referring Provider (PT) Magnus Ivan    Onset Date/Surgical Date 07/17/20    Next MD Visit 08/07/2020      Precautions   Precautions None      Restrictions   Weight Bearing Restrictions No      Balance Screen   Has the patient fallen in the past 6 months No    Has the patient had a decrease in activity level because of a fear of falling?  Yes   d/t knee instability before surgery   Is the  patient reluctant to leave their home because of a fear of falling?  No      Home Environment   Additional Comments 2 steps to enter no HR      Prior Function   Level of Independence Independent    Vocation Full time employment    Vocation Requirements hairdresser    Leisure walking, hiking      Observation/Other Assessments-Edema    Edema Circumferential      Circumferential Edema   Circumferential - Right 45 cm mid patella    Circumferential - Left  38 cm mid patella      ROM / Strength   AROM / PROM / Strength AROM;Strength;PROM      AROM   AROM Assessment Site Knee    Right/Left Knee Right    Right Knee Extension 8    Right Knee Flexion 62      PROM   PROM Assessment Site Knee    Right/Left Knee Right    Right Knee Extension 7    Right Knee Flexion 66      Palpation   Palpation comment tender to palpation R quads and medial knee      Ambulation/Gait   Gait Comments gait with SPC, antalgic gait, flexed R knee, lateral lean to L      Standardized Balance Assessment    Standardized Balance Assessment Timed Up and Go Test      Timed Up and Go Test   Normal TUG (seconds) 22    TUG Comments with SPC                      Objective measurements completed on examination: See above findings.       [facility] Adult PT Treatment/Exercise - 08/04/20 0001      Exercises   Exercises Knee/Hip      Knee/Hip Exercises: Stretches   Knee: Self-Stretch to increase Flexion Right;2 reps;20 seconds    Other Knee/Hip Stretches knee ext stretch with quad contraction x10 w/ 3 sec hold      Modalities   Modalities Vasopneumatic      Vasopneumatic   Number Minutes Vasopneumatic  15 minutes    Vasopnuematic Location  Knee    Vasopneumatic Pressure Low    Vasopneumatic Temperature  34                  PT Education - 08/04/20 1030    Education Details Pt educated on POC and HEP    Person(s) Educated Patient    Methods Explanation;Demonstration;Handout    Comprehension Verbalized understanding;Returned demonstration            PT Short Term Goals - 08/04/20 1034      PT SHORT TERM GOAL #1   Title Pt will be independent with HEP    Time 2    Period Weeks    Status New    Target Date 08/18/20             PT Long Term Goals - 08/04/20 1034      PT LONG TERM GOAL #1   Title Pt will demo knee flexion to 110    Baseline 65    Time 8    Period Weeks    Status New    Target Date 09/29/20      PT LONG TERM GOAL #2   Title Pt will demo knee extension to </= 3 deg    Baseline 8 deg  Time 8    Period Weeks    Status New    Target Date 09/29/20      PT LONG TERM GOAL #3   Title Pt will ambulate >500 ft over level/unlevel surfaces independently with no AD    Time 8    Period Weeks    Status New    Target Date 09/29/20      PT LONG TERM GOAL #4   Title Pt will demo TUG <15 sec with no AD    Baseline 22 sec with SPC    Time 8    Period Weeks    Status New    Target Date 09/29/20      PT LONG TERM GOAL #5   Title Pt will  demo circumferential edema of R knee </=2cm diff from L knee    Time 8    Period Weeks    Status New    Target Date 09/29/20                  Plan - 08/04/20 1031    Clinical Impression Statement Pt presents to clinic s/p R TKA on 07/17/2020. Pt has been receiving HH PT for the past few weeks; ambulates into clinic with SPC. Demos flexed knee in stance and mild antalgic gait; reinforcement on sequencing of LE and AD. Pt demos knee flexion to 65 deg and knee ext 8 deg this rx. Educated on importance of knee flexion stretching with pt VU. Pt responded well to vaso for pain/swelling. 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 Pt would benefit from skilled PT to address the above impairments.    Examination-Activity Limitations Locomotion Level;Stand    Examination-Participation Restrictions Community Activity;Interpersonal Relationship    Stability/Clinical Decision Making Stable/Uncomplicated    Clinical Decision Making Low    Rehab Potential Good    PT Frequency 3x / week   2-3x/week   PT Duration 8 weeks    PT Treatment/Interventions ADLs/Self Care Home Management;Cryotherapy;Electrical Stimulation;Gait training;Stair training;Functional mobility training;Therapeutic activities;Therapeutic exercise;Balance training;Neuromuscular re-education;Manual techniques;Patient/family education;Scar mobilization;Passive range of motion;Vasopneumatic Device    PT Next Visit Plan knee ROM and function    PT Home Exercise Plan knee flexion and extension stretch, standing hip ex's from [facility] PT    Consulted and Agree with Plan of Care Patient           Patient will benefit from skilled therapeutic intervention in order to improve the following deficits and impairments:  Abnormal gait, Decreased range of motion, Difficulty walking, Decreased endurance, Pain, Impaired flexibility, Increased edema  Visit Diagnosis: Acute pain of right knee  Stiffness of  right knee, not elsewhere classified  Localized edema  Difficulty in walking, not elsewhere classified     Problem List Patient Active Problem List   Diagnosis Date Noted  . Status post total right knee replacement 07/17/2020  . Unilateral primary osteoarthritis, right knee 12/30/2019  . Acute right-sided low back pain with right-sided sciatica 12/25/2017  . Health maintenance examination 09/30/2013  . Hyperlipidemia 09/30/2013   Lysle Rubens, PT, DPT Maryanna Shape Alisandra Son 08/04/2020, 10:37 AM  [facility] [place] [address] [place], [place], [address] Phone: [phone]   Fax:  [phone]  Name: Adah Stoneberg MRN: [id number] Date of Birth: 1956-10-05

## 2020-08-04 NOTE — Patient Instructions (Signed)
Access Code: [email or web address] URL: [email or web address] Date: 08/04/2020 Prepared by: Lysle Rubens  Exercises Seated Knee Extension Stretch with Chair - 1 x daily - 7 x weekly - 3 sets - 10 reps - 3 sec hold Seated Knee Flexion Stretch - 1 x daily - 7 x weekly - 3 sets - 3 reps - 20 sec hold

## 2020-08-07 ENCOUNTER — Other Ambulatory Visit: Payer: Self-pay

## 2020-08-07 ENCOUNTER — Encounter: Payer: Self-pay | Admitting: Physical Therapy

## 2020-08-07 ENCOUNTER — Ambulatory Visit: Payer: Medicare Other | Admitting: Physical Therapy

## 2020-08-07 DIAGNOSIS — M25561 Pain in right knee: Secondary | ICD-10-CM

## 2020-08-07 DIAGNOSIS — R600 Localized edema: Secondary | ICD-10-CM

## 2020-08-07 DIAGNOSIS — M25661 Stiffness of right knee, not elsewhere classified: Secondary | ICD-10-CM

## 2020-08-07 DIAGNOSIS — R262 Difficulty in walking, not elsewhere classified: Secondary | ICD-10-CM

## 2020-08-07 NOTE — Therapy (Signed)
[facility] [place] [address] [place], [place], [address] Phone: [phone]   Fax:  [phone]  Physical Therapy Treatment  Patient Details  Name: Nancy Eaton MRN: [id number] Date of Birth: 03-26-1956 Referring Provider (PT): Merri Brunette Date: 08/07/2020   PT End of Session - 08/07/20 0840    Visit Number 2    Date for PT Re-Evaluation 10/04/20    PT Start Time 0802    PT Stop Time 0851    PT Time Calculation (min) 49 min    Activity Tolerance Patient tolerated treatment well    Behavior During Therapy [facility] for tasks assessed/performed           Past Medical History:  Diagnosis Date  . Arthritis    Knee  . Hyperlipidemia    Lovastatin started approx 2011  . IFG (impaired fasting glucose) 2018   HbA1c 5.9% (stable)  . PONV (postoperative nausea and vomiting)   . Pre-diabetes   . Right knee pain 12/2019   End stage: TKA recommended by ortho 01/2020->pt considering  . Sigmoid diverticulosis 04/2010   "     "     "    "    Past Surgical History:  Procedure Laterality Date  . BREAST ENHANCEMENT SURGERY     As of mammogram 12/2017--rupture of implants stable.  Marland Kitchen CARPAL TUNNEL RELEASE    . CESAREAN SECTION    . COLONOSCOPY  age 55   [facility]--recall 10 yrs  . TOTAL KNEE ARTHROPLASTY Right 07/17/2020   Procedure: RIGHT TOTAL KNEE ARTHROPLASTY;  Surgeon: Kathryne Hitch, MD;  Location: [facility];  Service: Orthopedics;  Laterality: Right;    There were no vitals filed for this visit.   Subjective Assessment - 08/07/20 0805    Subjective Pt reports that she is feeling a little better; has some sharp shooting pains on the medial knee.    Currently in Pain? Yes    Pain Score 2     Pain Location Knee    Pain Orientation Right                             [facility] Adult PT Treatment/Exercise - 08/07/20 0001      Knee/Hip Exercises: Aerobic   Nustep L4 x 6 min      Knee/Hip  Exercises: Machines for Strengthening   Cybex Leg Press leg press no weight down to 7 x5 w/ 10 sec hold and extension stretch      Knee/Hip Exercises: Seated   Long Arc Quad Right;2 sets;10 reps    Long Arc Quad Limitations red TB for ROM assist with TKE    Hamstring Curl Right;2 sets;10 reps    Hamstring Limitations red TB; cues for increased ROM    Sit to Sand 20 reps;without UE support   from elevated mat table and cues for equal WB     Vasopneumatic   Number Minutes Vasopneumatic  15 minutes    Vasopnuematic Location  Knee    Vasopneumatic Pressure Medium    Vasopneumatic Temperature  34      Manual Therapy   Manual Therapy Joint mobilization;Passive ROM    Joint Mobilization gentle distraction    Passive ROM knee flexion/extension                    PT Short Term Goals - 08/07/20 7782      PT  SHORT TERM GOAL #1   Title Pt will be independent with HEP    Status Achieved             PT Long Term Goals - 08/04/20 1034      PT LONG TERM GOAL #1   Title Pt will demo knee flexion to 110    Baseline 65    Time 8    Period Weeks    Status New    Target Date 09/29/20      PT LONG TERM GOAL #2   Title Pt will demo knee extension to </= 3 deg    Baseline 8 deg    Time 8    Period Weeks    Status New    Target Date 09/29/20      PT LONG TERM GOAL #3   Title Pt will ambulate >500 ft over level/unlevel surfaces independently with no AD    Time 8    Period Weeks    Status New    Target Date 09/29/20      PT LONG TERM GOAL #4   Title Pt will demo TUG <15 sec with no AD    Baseline 22 sec with SPC    Time 8    Period Weeks    Status New    Target Date 09/29/20      PT LONG TERM GOAL #5   Title Pt will demo circumferential edema of R knee </=2cm diff from L knee    Time 8    Period Weeks    Status New    Target Date 09/29/20                 Plan - 08/07/20 0841    Clinical Impression Statement Pt tolerated progression to TE well.  Difficulties with active knee flexion; cues for increased ROM with seated knee flexion/extension exercise. Gait around clinic without AD; cues for equal WB. Pt demos excessive knee flexion in stance. Continue to push rom/function.    PT Treatment/Interventions ADLs/Self Care Home Management;Cryotherapy;Electrical Stimulation;Gait training;Stair training;Functional mobility training;Therapeutic activities;Therapeutic exercise;Balance training;Neuromuscular re-education;Manual techniques;Patient/family education;Scar mobilization;Passive range of motion;Vasopneumatic Device    PT Next Visit Plan knee ROM and function    Consulted and Agree with Plan of Care Patient           Patient will benefit from skilled therapeutic intervention in order to improve the following deficits and impairments:  Abnormal gait, Decreased range of motion, Difficulty walking, Decreased endurance, Pain, Impaired flexibility, Increased edema  Visit Diagnosis: Acute pain of right knee  Stiffness of right knee, not elsewhere classified  Localized edema  Difficulty in walking, not elsewhere classified     Problem List Patient Active Problem List   Diagnosis Date Noted  . Status post total right knee replacement 07/17/2020  . Unilateral primary osteoarthritis, right knee 12/30/2019  . Acute right-sided low back pain with right-sided sciatica 12/25/2017  . Health maintenance examination 09/30/2013  . Hyperlipidemia 09/30/2013   Lysle Rubens, PT, DPT Maryanna Shape Desta Bujak 08/07/2020, 8:44 AM  [facility] [place] [address] [place], [place], [address] Phone: [phone]   Fax:  [phone]  Name: Nancy Eaton MRN: [id number] Date of Birth: 27-Jan-1956

## 2020-08-11 ENCOUNTER — Other Ambulatory Visit: Payer: Self-pay

## 2020-08-11 ENCOUNTER — Ambulatory Visit: Payer: Medicare Other | Admitting: Physical Therapy

## 2020-08-11 DIAGNOSIS — M25561 Pain in right knee: Secondary | ICD-10-CM

## 2020-08-11 DIAGNOSIS — M25661 Stiffness of right knee, not elsewhere classified: Secondary | ICD-10-CM

## 2020-08-11 DIAGNOSIS — R262 Difficulty in walking, not elsewhere classified: Secondary | ICD-10-CM

## 2020-08-11 DIAGNOSIS — R600 Localized edema: Secondary | ICD-10-CM

## 2020-08-11 NOTE — Therapy (Signed)
[facility] [address] [place], [place], [address] Phone: [phone]   Fax:  [phone]  Physical Therapy Treatment  Patient Details  Name: Nancy Eaton MRN: [id number] Date of Birth: February 17, 1956 Referring Provider (PT): Merri Brunette Date: 08/11/2020   PT End of Session - 08/11/20 1011    Visit Number 3    Date for PT Re-Evaluation 10/04/20    PT Start Time 0930    PT Stop Time 1030    PT Time Calculation (min) 60 min           Past Medical History:  Diagnosis Date  . Arthritis    Knee  . Hyperlipidemia    Lovastatin started approx 2011  . IFG (impaired fasting glucose) 2018   HbA1c 5.9% (stable)  . PONV (postoperative nausea and vomiting)   . Pre-diabetes   . Right knee pain 12/2019   End stage: TKA recommended by ortho 01/2020->pt considering  . Sigmoid diverticulosis 04/2010   "     "     "    "    Past Surgical History:  Procedure Laterality Date  . BREAST ENHANCEMENT SURGERY     As of mammogram 12/2017--rupture of implants stable.  Marland Kitchen CARPAL TUNNEL RELEASE    . CESAREAN SECTION    . COLONOSCOPY  age 40   [facility]--recall 10 yrs  . TOTAL KNEE ARTHROPLASTY Right 07/17/2020   Procedure: RIGHT TOTAL KNEE ARTHROPLASTY;  Surgeon: Kathryne Hitch, MD;  Location: [facility];  Service: Orthopedics;  Laterality: Right;    There were no vitals filed for this visit.   Subjective Assessment - 08/11/20 0932    Subjective amb in without AD with stiff leg gait    Currently in Pain? Yes    Pain Score 3     Pain Location Knee    Pain Orientation Right                             [facility] Adult PT Treatment/Exercise - 08/11/20 0001      Knee/Hip Exercises: Aerobic   Recumbent Bike 5 min Rocking foir ROM    Nustep L 5 6 min      Knee/Hip Exercises: Machines for Strengthening   Cybex Knee Extension 5# up with 2 ecc lowering with RT    10x   Cybex Knee Flexion 10# RT only 10x     Cybex Leg Press 20# 2 sets 10      Knee/Hip Exercises: Standing   Heel Raises Both;20 reps;3 seconds   on black bar   Hip Flexion Stengthening;Right;15 reps;Knee bent   3#   Other Standing Knee Exercises HS curl 15 x 3#      Knee/Hip Exercises: Seated   Long Arc Quad Right;2 sets;10 reps;Weights   cues to activate quad vs hip flexor   Long Arc Quad Weight 3 lbs.    Hamstring Curl Right;2 sets;10 reps    Hamstring Limitations green tband      Modalities   Modalities Vasopneumatic      Vasopneumatic   Number Minutes Vasopneumatic  15 minutes    Vasopnuematic Location  Knee    Vasopneumatic Pressure Medium    Vasopneumatic Temperature  34      Manual Therapy   Manual Therapy Joint mobilization;Passive ROM    Joint Mobilization gentle distraction    Passive ROM knee flexion/extension  PT Short Term Goals - 08/07/20 0843      PT SHORT TERM GOAL #1   Title Pt will be independent with HEP    Status Achieved             PT Long Term Goals - 08/04/20 1034      PT LONG TERM GOAL #1   Title Pt will demo knee flexion to 110    Baseline 65    Time 8    Period Weeks    Status New    Target Date 09/29/20      PT LONG TERM GOAL #2   Title Pt will demo knee extension to </= 3 deg    Baseline 8 deg    Time 8    Period Weeks    Status New    Target Date 09/29/20      PT LONG TERM GOAL #3   Title Pt will ambulate >500 ft over level/unlevel surfaces independently with no AD    Time 8    Period Weeks    Status New    Target Date 09/29/20      PT LONG TERM GOAL #4   Title Pt will demo TUG <15 sec with no AD    Baseline 22 sec with SPC    Time 8    Period Weeks    Status New    Target Date 09/29/20      PT LONG TERM GOAL #5   Title Pt will demo circumferential edema of R knee </=2cm diff from L knee    Time 8    Period Weeks    Status New    Target Date 09/29/20                 Plan - 08/11/20 1011    Clinical Impression  Statement pt needed alot of cuing with ex to activate correct muscles and increase ROM. pt very guarded and resisted to mvmt an dneeds cuing to relax. pt uses hip flex vs quad and holds knee stiff-cued to flex in swing phase    PT Treatment/Interventions ADLs/Self Care Home Management;Cryotherapy;Electrical Stimulation;Gait training;Stair training;Functional mobility training;Therapeutic activities;Therapeutic exercise;Balance training;Neuromuscular re-education;Manual techniques;Patient/family education;Scar mobilization;Passive range of motion;Vasopneumatic Device    PT Next Visit Plan knee ROM and function           Patient will benefit from skilled therapeutic intervention in order to improve the following deficits and impairments:  Abnormal gait, Decreased range of motion, Difficulty walking, Decreased endurance, Pain, Impaired flexibility, Increased edema  Visit Diagnosis: Stiffness of right knee, not elsewhere classified  Acute pain of right knee  Localized edema  Difficulty in walking, not elsewhere classified     Problem List Patient Active Problem List   Diagnosis Date Noted  . Status post total right knee replacement 07/17/2020  . Unilateral primary osteoarthritis, right knee 12/30/2019  . Acute right-sided low back pain with right-sided sciatica 12/25/2017  . Health maintenance examination 09/30/2013  . Hyperlipidemia 09/30/2013    Fraser Busche,ANGIE PTA 08/11/2020, 10:13 AM  [facility] [address] [place], [place], [address] Phone: [phone]   Fax:  [phone]  Name: Nancy Eaton MRN: [id number] Date of Birth: May 03, 1956

## 2020-08-13 ENCOUNTER — Ambulatory Visit: Payer: Medicare Other | Admitting: Physical Therapy

## 2020-08-13 ENCOUNTER — Other Ambulatory Visit: Payer: Self-pay

## 2020-08-13 DIAGNOSIS — R600 Localized edema: Secondary | ICD-10-CM

## 2020-08-13 DIAGNOSIS — R262 Difficulty in walking, not elsewhere classified: Secondary | ICD-10-CM

## 2020-08-13 DIAGNOSIS — M25561 Pain in right knee: Secondary | ICD-10-CM | POA: Diagnosis not present

## 2020-08-13 DIAGNOSIS — M25661 Stiffness of right knee, not elsewhere classified: Secondary | ICD-10-CM

## 2020-08-13 NOTE — Therapy (Signed)
[place] [place], [place], [address] Phone: [phone]   Fax:  [phone]  Physical Therapy Treatment  Patient Details  Name: Nancy Eaton MRN: [id number] Date of Birth: 07-Jun-1956 Referring Provider (PT): Hedy Camara Date: 08/13/2020   PT End of Session - 08/13/20 1009    Visit Number 4    Date for PT Re-Evaluation 10/04/20    PT Start Time 0930    PT Stop Time 1025    PT Time Calculation (min) 55 min           Past Medical History:  Diagnosis Date  . Arthritis    Knee  . Hyperlipidemia    Lovastatin started approx 2011  . IFG (impaired fasting glucose) 2018   HbA1c 5.9% (stable)  . PONV (postoperative nausea and vomiting)   . Pre-diabetes   . Right knee pain 12/2019   End stage: TKA recommended by ortho 01/2020->pt considering  . Sigmoid diverticulosis 04/2010   "     "     "    "    Past Surgical History:  Procedure Laterality Date  . BREAST ENHANCEMENT SURGERY     As of mammogram 12/2017--rupture of implants stable.  Marland Kitchen CARPAL TUNNEL RELEASE    . CESAREAN SECTION    . COLONOSCOPY  age 58   [facility]--recall 10 yrs  . TOTAL KNEE ARTHROPLASTY Right 07/17/2020   Procedure: RIGHT TOTAL KNEE ARTHROPLASTY;  Surgeon: Mcarthur Rossetti, MD;  Location: [facility];  Service: Orthopedics;  Laterality: Right;    There were no vitals filed for this visit.   Subjective Assessment - 08/13/20 0931    Subjective working hard to get it moving. struggling iwth pain meds making me sick. 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 Yes    Pain Score 3     Pain Location Knee    Pain Orientation Right              [facility] PT Assessment - 08/13/20 0001      AROM   AROM Assessment Site Knee    Right/Left Knee Right    Right Knee Extension 6    Right Knee Flexion 90   after stretches                        [facility] Adult PT  Treatment/Exercise - 08/13/20 0001      Ambulation/Gait   Gait Comments cued to flex thru hip an dknee with swing phase as pt walks without AD but stiff legged- can correct with cuing      Knee/Hip Exercises: Aerobic   Recumbent Bike 5 min Rocking for ROM    Nustep L 5 6 min      Knee/Hip Exercises: Machines for Strengthening   Cybex Knee Extension 5# 3 sets 5   cues for increased TKE-lacking   Cybex Knee Flexion 10# RT only 10x    Cybex Leg Press 20# 2 sets 10      Knee/Hip Exercises: Standing   Knee Flexion PROM;Both;3 sets;5 sets   step stretch   Forward Step Up Limitations step tap 1 step, 2 step 8 each with cuing      Knee/Hip Exercises: Seated   Other Seated Knee/Hip Exercises fitter flex and ext 2 sets 10 1 blue      Modalities   Modalities Vasopneumatic  Vasopneumatic   Number Minutes Vasopneumatic  15 minutes    Vasopnuematic Location  Knee    Vasopneumatic Pressure Medium    Vasopneumatic Temperature  34      Manual Therapy   Manual Therapy Joint mobilization;Passive ROM    Joint Mobilization with belt for jt capsule    Passive ROM knee flexion/extension   belt mobs                   PT Short Term Goals - 08/07/20 0843      PT SHORT TERM GOAL #1   Title Pt will be independent with HEP    Status Achieved             PT Long Term Goals - 08/13/20 1010      PT LONG TERM GOAL #1   Title Pt will demo knee flexion to 110    Status On-going      PT LONG TERM GOAL #2   Title Pt will demo knee extension to </= 3 deg    Status On-going      PT LONG TERM GOAL #3   Title Pt will ambulate >500 ft over level/unlevel surfaces independently with no AD    Status Partially Met      PT LONG TERM GOAL #4   Title Pt will demo TUG <15 sec with no AD    Status Achieved      PT LONG TERM GOAL #5   Title Pt will demo circumferential edema of R knee </=2cm diff from L knee    Status Partially Met                 Plan - 08/13/20 1009     Clinical Impression Statement Pass flex 90 degress with max effort active after stretching. pt continues to be very guarded wih flexion and cued to relax. cued with ext to engage quad .           Patient will benefit from skilled therapeutic intervention in order to improve the following deficits and impairments:     Visit Diagnosis: Stiffness of right knee, not elsewhere classified  Acute pain of right knee  Localized edema  Difficulty in walking, not elsewhere classified     Problem List Patient Active Problem List   Diagnosis Date Noted  . Status post total right knee replacement 07/17/2020  . Unilateral primary osteoarthritis, right knee 12/30/2019  . Acute right-sided low back pain with right-sided sciatica 12/25/2017  . Health maintenance examination 09/30/2013  . Hyperlipidemia 09/30/2013    Shaul Trautman,ANGIE PTA 08/13/2020, 10:11 AM  [place] [place], [place], [address] Phone: [phone]   Fax:  [phone]  Name: Nancy Eaton MRN: [id number] Date of Birth: [birth year]

## 2020-08-14 ENCOUNTER — Ambulatory Visit: Payer: Medicare Other | Admitting: Physical Therapy

## 2020-08-14 DIAGNOSIS — M25561 Pain in right knee: Secondary | ICD-10-CM

## 2020-08-14 DIAGNOSIS — R600 Localized edema: Secondary | ICD-10-CM

## 2020-08-14 DIAGNOSIS — R262 Difficulty in walking, not elsewhere classified: Secondary | ICD-10-CM

## 2020-08-14 DIAGNOSIS — M25661 Stiffness of right knee, not elsewhere classified: Secondary | ICD-10-CM

## 2020-08-14 NOTE — Therapy (Signed)
[facility] [address] [place], [place], [address] Phone: [phone]   Fax:  [phone]  Physical Therapy Treatment  Patient Details  Name: Nancy Eaton MRN: [id number] Date of Birth: 02/05/1956 Referring Provider (PT): Blackman   Encounter Date: 08/14/2020   PT End of Session - 08/14/20 0916    Visit Number 5    Date for PT Re-Evaluation 10/04/20    PT Start Time 0845    PT Stop Time 0940    PT Time Calculation (min) 55 min           Past Medical History:  Diagnosis Date  . Arthritis    Knee  . Hyperlipidemia    Lovastatin started approx 2011  . IFG (impaired fasting glucose) 2018   HbA1c 5.9% (stable)  . PONV (postoperative nausea and vomiting)   . Pre-diabetes   . Right knee pain 12/2019   End stage: TKA recommended by ortho 01/2020->pt considering  . Sigmoid diverticulosis 04/2010   "     "     "    "    Past Surgical History:  Procedure Laterality Date  . BREAST ENHANCEMENT SURGERY     As of mammogram 12/2017--rupture of implants stable.  . CARPAL TUNNEL RELEASE    . CESAREAN SECTION    . COLONOSCOPY  age 48   [facility]--recall 10 yrs  . TOTAL KNEE ARTHROPLASTY Right 07/17/2020   Procedure: RIGHT TOTAL KNEE ARTHROPLASTY;  Surgeon: Blackman, Christopher Y, MD;  Location: [facility];  Service: Orthopedics;  Laterality: Right;    There were no vitals filed for this visit.   Subjective Assessment - 08/14/20 0844    Subjective sick as a dog again yesterday with pain meds so no meds today. amb in very stiff legged    Currently in Pain? Yes    Pain Score 3     Pain Location Knee    Pain Orientation Right                             [facility] Adult PT Treatment/Exercise - 08/14/20 0001      Knee/Hip Exercises: Aerobic   Elliptical I 3 R 2- working on flexion with stride -cued to not hip hike. 3 min    Nustep L 4 6min LE only      Knee/Hip Exercises: Machines for Strengthening    Cybex Leg Press 20# 2 sets 10- cued for full TEK and quad activation   machine unlocked 10 x for ROM     Knee/Hip Exercises: Standing   Heel Raises Both;20 reps;2 seconds   on black bar   Forward Step Up Right;1 set;10 reps;Hand Hold: 2;Step Height: 6"   focus on TKE and quad activation   Other Standing Knee Exercises cable step down 30# 2 sets 10      Knee/Hip Exercises: Seated   Long Arc Quad Right;2 sets;10 reps;Weights   working on full 3 sec TKE   Long Arc Quad Weight 3 lbs.    Hamstring Curl Right;2 sets;10 reps    Hamstring Limitations green tband      Knee/Hip Exercises: Supine   Short Arc Quad Sets Strengthening;Right;2 sets;10 reps   3#, focus on TKE and quad actviation     Modalities   Modalities Vasopneumatic      Vasopneumatic   Number Minutes Vasopneumatic  15 minutes    Vasopnuematic Location  Knee      Vasopneumatic Pressure Medium    Vasopneumatic Temperature  34      Manual Therapy   Manual therapy comments pt swollen and resistive ( cued to relax). pt is working doing hair cautioned about overdoing and elevation above heart    Passive ROM knee flexion with CR                    PT Short Term Goals - 08/07/20 0843      PT SHORT TERM GOAL #1   Title Pt will be independent with HEP    Status Achieved             PT Long Term Goals - 08/13/20 1010      PT LONG TERM GOAL #1   Title Pt will demo knee flexion to 110    Status On-going      PT LONG TERM GOAL #2   Title Pt will demo knee extension to </= 3 deg    Status On-going      PT LONG TERM GOAL #3   Title Pt will ambulate >500 ft over level/unlevel surfaces independently with no AD    Status Partially Met      PT LONG TERM GOAL #4   Title Pt will demo TUG <15 sec with no AD    Status Achieved      PT LONG TERM GOAL #5   Title Pt will demo circumferential edema of R knee </=2cm diff from L knee    Status Partially Met                 Plan - 08/14/20 0916    Clinical  Impression Statement pt continues to ambwith RT knee held in flexion with some circumduction. with cuing pt can demo correct but poor carry over. some circimduction with activy too needing cuing. Pt has quad lag and needs cuing to activate quad. 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 above heart for swelling- she VU    PT Treatment/Interventions ADLs/Self Care Home Management;Cryotherapy;Electrical Stimulation;Gait training;Stair training;Functional mobility training;Therapeutic activities;Therapeutic exercise;Balance training;Neuromuscular re-education;Manual techniques;Patient/family education;Scar mobilization;Passive range of motion;Vasopneumatic Device    PT Next Visit Plan knee ROM and function           Patient will benefit from skilled therapeutic intervention in order to improve the following deficits and impairments:  Abnormal gait, Decreased range of motion, Difficulty walking, Decreased endurance, Pain, Impaired flexibility, Increased edema  Visit Diagnosis: Acute pain of right knee  Stiffness of right knee, not elsewhere classified  Localized edema  Difficulty in walking, not elsewhere classified     Problem List Patient Active Problem List   Diagnosis Date Noted  . Status post total right knee replacement 07/17/2020  . Unilateral primary osteoarthritis, right knee 12/30/2019  . Acute right-sided low back pain with right-sided sciatica 12/25/2017  . Health maintenance examination 09/30/2013  . Hyperlipidemia 09/30/2013    PAYSEUR,ANGIE PTA 08/14/2020, 9:20 AM  [facility] [address] [place], [place], [address] Phone: [phone]   Fax:  [phone]  Name: Nancy Eaton MRN: [id number] Date of Birth: 07/25/1956   

## 2020-08-18 ENCOUNTER — Ambulatory Visit: Payer: Medicare Other | Admitting: Physical Therapy

## 2020-08-18 ENCOUNTER — Other Ambulatory Visit: Payer: Self-pay

## 2020-08-18 DIAGNOSIS — R600 Localized edema: Secondary | ICD-10-CM

## 2020-08-18 DIAGNOSIS — M25561 Pain in right knee: Secondary | ICD-10-CM

## 2020-08-18 DIAGNOSIS — M25661 Stiffness of right knee, not elsewhere classified: Secondary | ICD-10-CM

## 2020-08-18 DIAGNOSIS — R262 Difficulty in walking, not elsewhere classified: Secondary | ICD-10-CM

## 2020-08-18 NOTE — Therapy (Signed)
[facility] [address] [place], [place], [address] Phone: [phone]   Fax:  [phone]  Physical Therapy Treatment  Patient Details  Name: Nancy Eaton MRN: [id number] Date of Birth: 03/17/1956 Referring Provider (PT): Blackman   Encounter Date: 08/18/2020   PT End of Session - 08/18/20 0848    Visit Number 6    Date for PT Re-Evaluation 10/04/20    PT Start Time 0805    PT Stop Time 0900    PT Time Calculation (min) 55 min           Past Medical History:  Diagnosis Date  . Arthritis    Knee  . Hyperlipidemia    Lovastatin started approx 2011  . IFG (impaired fasting glucose) 2018   HbA1c 5.9% (stable)  . PONV (postoperative nausea and vomiting)   . Pre-diabetes   . Right knee pain 12/2019   End stage: TKA recommended by ortho 01/2020->pt considering  . Sigmoid diverticulosis 04/2010   "     "     "    "    Past Surgical History:  Procedure Laterality Date  . BREAST ENHANCEMENT SURGERY     As of mammogram 12/2017--rupture of implants stable.  . CARPAL TUNNEL RELEASE    . CESAREAN SECTION    . COLONOSCOPY  age 48   [facility]--recall 10 yrs  . TOTAL KNEE ARTHROPLASTY Right 07/17/2020   Procedure: RIGHT TOTAL KNEE ARTHROPLASTY;  Surgeon: Blackman, Christopher Y, MD;  Location: [facility];  Service: Orthopedics;  Laterality: Right;    There were no vitals filed for this visit.   Subjective Assessment - 08/18/20 0838    Subjective --    Currently in Pain? --    Pain Score --    Pain Location --    Pain Orientation --                             [facility] Adult PT Treatment/Exercise - 08/18/20 0001      Ambulation/Gait   Gait Comments had pt demo correct gait with knee and hip flexion in swing- pt can demo but very then will revert back . stressed need to make it a concious effort and she VU      Knee/Hip Exercises: Aerobic   Elliptical I 8 R 3 4 min     Recumbent Bike 5 min rocking-  even at end of session but hesitant to bend    Nustep L 4 6min LE only   cued to let RT knee bend     Knee/Hip Exercises: Machines for Strengthening   Cybex Knee Extension 5# 3 sets 5    Cybex Knee Flexion 10# RT only 10x 2 sets   verb and tactile cuing for increased ROM   Cybex Leg Press 20# 2 sets 10- cued for full TKE and quad activation   10x unlocked for ROM     Knee/Hip Exercises: Standing   Forward Step Up Right;15 reps;Hand Hold: 1;Step Height: 8"   step tap for flex- cued for compensation   Walking with Sports Cord 30# 8 x fwd -cued for flex in swing phase      Modalities   Modalities Vasopneumatic      Vasopneumatic   Number Minutes Vasopneumatic  15 minutes    Vasopnuematic Location  Knee    Vasopneumatic Pressure Medium    Vasopneumatic Temperature  34        Manual Therapy   Manual Therapy Passive ROM    Passive ROM knee flexion- cued to relax muscles                    PT Short Term Goals - 08/07/20 0843      PT SHORT TERM GOAL #1   Title Pt will be independent with HEP    Status Achieved             PT Long Term Goals - 08/13/20 1010      PT LONG TERM GOAL #1   Title Pt will demo knee flexion to 110    Status On-going      PT LONG TERM GOAL #2   Title Pt will demo knee extension to </= 3 deg    Status On-going      PT LONG TERM GOAL #3   Title Pt will ambulate >500 ft over level/unlevel surfaces independently with no AD    Status Partially Met      PT LONG TERM GOAL #4   Title Pt will demo TUG <15 sec with no AD    Status Achieved      PT LONG TERM GOAL #5   Title Pt will demo circumferential edema of R knee </=2cm diff from L knee    Status Partially Met                 Plan - 08/18/20 0848    Clinical Impression Statement pt continues to need alot of cuing for knee flexion with gait and ex. pt walks stiff legged with [place] gait- pt can demo better gait and verb she needs to but revert back- stressed need to bend  knee to avoid stiffness and other joint issues. cued with ex to relax muslces as her initial reaction is to guard.    PT Next Visit Plan check ROM and goals           Patient will benefit from skilled therapeutic intervention in order to improve the following deficits and impairments:  Abnormal gait, Decreased range of motion, Difficulty walking, Decreased endurance, Pain, Impaired flexibility, Increased edema  Visit Diagnosis: Acute pain of right knee  Stiffness of right knee, not elsewhere classified  Localized edema  Difficulty in walking, not elsewhere classified     Problem List Patient Active Problem List   Diagnosis Date Noted  . Status post total right knee replacement 07/17/2020  . Unilateral primary osteoarthritis, right knee 12/30/2019  . Acute right-sided low back pain with right-sided sciatica 12/25/2017  . Health maintenance examination 09/30/2013  . Hyperlipidemia 09/30/2013    Bonham Zingale,ANGIE PTA 08/18/2020, 8:50 AM  [place]. [place], [place], [address] Phone: [phone]   Fax:  [phone]  Name: Nancy Eaton MRN: [id number] Date of Birth: [birth year]

## 2020-08-19 ENCOUNTER — Ambulatory Visit: Payer: Medicare Other | Admitting: Physical Therapy

## 2020-08-19 ENCOUNTER — Encounter: Payer: Self-pay | Admitting: Physical Therapy

## 2020-08-19 DIAGNOSIS — M25561 Pain in right knee: Secondary | ICD-10-CM

## 2020-08-19 DIAGNOSIS — M25661 Stiffness of right knee, not elsewhere classified: Secondary | ICD-10-CM

## 2020-08-19 DIAGNOSIS — R262 Difficulty in walking, not elsewhere classified: Secondary | ICD-10-CM

## 2020-08-19 DIAGNOSIS — R600 Localized edema: Secondary | ICD-10-CM

## 2020-08-19 NOTE — Therapy (Signed)
[place]. [place], [place], [address] Phone: [phone]   Fax:  [phone]  Physical Therapy Treatment  Patient Details  Name: Nancy Eaton MRN: [id number] Date of Birth: 1956/11/15 Referring Provider (PT): Hedy Camara Date: 08/19/2020   PT End of Session - 08/19/20 0928    Visit Number 7    Date for PT Re-Evaluation 10/04/20    PT Start Time 0845    PT Stop Time 0935    PT Time Calculation (min) 50 min    Activity Tolerance Patient tolerated treatment well    Behavior During Therapy [facility] for tasks assessed/performed           Past Medical History:  Diagnosis Date  . Arthritis    Knee  . Hyperlipidemia    Lovastatin started approx 2011  . IFG (impaired fasting glucose) 2018   HbA1c 5.9% (stable)  . PONV (postoperative nausea and vomiting)   . Pre-diabetes   . Right knee pain 12/2019   End stage: TKA recommended by ortho 01/2020->pt considering  . Sigmoid diverticulosis 04/2010   "     "     "    "    Past Surgical History:  Procedure Laterality Date  . BREAST ENHANCEMENT SURGERY     As of mammogram 12/2017--rupture of implants stable.  Marland Kitchen CARPAL TUNNEL RELEASE    . CESAREAN SECTION    . COLONOSCOPY  age 68   [facility]--recall 10 yrs  . TOTAL KNEE ARTHROPLASTY Right 07/17/2020   Procedure: RIGHT TOTAL KNEE ARTHROPLASTY;  Surgeon: Mcarthur Rossetti, MD;  Location: [facility];  Service: Orthopedics;  Laterality: Right;    There were no vitals filed for this visit.   Subjective Assessment - 08/19/20 0850    Subjective Pt reports that she is pretty sore and stiff today    Currently in Pain? Yes    Pain Score 8     Pain Location Knee    Pain Orientation Right                             [facility] Adult PT Treatment/Exercise - 08/19/20 0001      Knee/Hip Exercises: Aerobic   Recumbent Bike 5 min with rocking and partial revolutions - very stiff and hesistant to  bend R knee    Nustep L 4 56mn LE only      Knee/Hip Exercises: Machines for Strengthening   Cybex Knee Extension 5# 3 sets 5; knee flexion stretch b/n    Cybex Knee Flexion 20# 2 sets 10- cued for full TKE and quad activation    Cybex Leg Press --      Knee/Hip Exercises: Standing   Lateral Step Up Both;1 set;Hand Hold: 1;Step Height: 6"    Lateral Step Up Limitations cues to avoid compensations    Forward Step Up Both;1 set;10 reps;Hand Hold: 1;Step Height: 6"    Forward Step Up Limitations cues for knee flexion    Step Down Both;1 set;10 reps;Hand Hold: 1;Step Height: 6"    Other Standing Knee Exercises alt step taps to 6" stair x10 B      Vasopneumatic   Number Minutes Vasopneumatic  10 minutes    Vasopnuematic Location  Knee    Vasopneumatic Pressure Medium    Vasopneumatic Temperature  34      Manual Therapy   Manual Therapy Passive ROM    Passive  ROM knee flexion- cued to relax muscles                    PT Short Term Goals - 08/07/20 0843      PT SHORT TERM GOAL #1   Title Pt will be independent with HEP    Status Achieved             PT Long Term Goals - 08/13/20 1010      PT LONG TERM GOAL #1   Title Pt will demo knee flexion to 110    Status On-going      PT LONG TERM GOAL #2   Title Pt will demo knee extension to </= 3 deg    Status On-going      PT LONG TERM GOAL #3   Title Pt will ambulate >500 ft over level/unlevel surfaces independently with no AD    Status Partially Met      PT LONG TERM GOAL #4   Title Pt will demo TUG <15 sec with no AD    Status Achieved      PT LONG TERM GOAL #5   Title Pt will demo circumferential edema of R knee </=2cm diff from L knee    Status Partially Met                 Plan - 08/19/20 0929    Clinical Impression Statement Pt demos increased stiffness and difficulty with knee flexion this rx. Pt unable to complete full revolutions on recumbent bike d/t increased stiffness. Cues for full ROM  with knee flexion/extension machine. Did well with step ups/step downs, cuing for decreased hip compensations. Check ROM tomorrow; excess stiffness limiting ROM today.    PT Treatment/Interventions ADLs/Self Care Home Management;Cryotherapy;Electrical Stimulation;Gait training;Stair training;Functional mobility training;Therapeutic activities;Therapeutic exercise;Balance training;Neuromuscular re-education;Manual techniques;Patient/family education;Scar mobilization;Passive range of motion;Vasopneumatic Device    PT Next Visit Plan check ROM and goals    Consulted and Agree with Plan of Care Patient           Patient will benefit from skilled therapeutic intervention in order to improve the following deficits and impairments:  Abnormal gait, Decreased range of motion, Difficulty walking, Decreased endurance, Pain, Impaired flexibility, Increased edema  Visit Diagnosis: Acute pain of right knee  Stiffness of right knee, not elsewhere classified  Localized edema  Difficulty in walking, not elsewhere classified     Problem List Patient Active Problem List   Diagnosis Date Noted  . Status post total right knee replacement 07/17/2020  . Unilateral primary osteoarthritis, right knee 12/30/2019  . Acute right-sided low back pain with right-sided sciatica 12/25/2017  . Health maintenance examination 09/30/2013  . Hyperlipidemia 09/30/2013   Amador Cunas, PT, DPT Donald Prose Shi Grose 08/19/2020, 9:35 AM  [place]. [place], [place], [address] Phone: [phone]   Fax:  [phone]  Name: Johara Lodwick MRN: [id number] Date of Birth: 10/21/1956

## 2020-08-20 ENCOUNTER — Telehealth: Payer: Self-pay

## 2020-08-20 ENCOUNTER — Ambulatory Visit: Payer: Medicare Other | Admitting: Physical Therapy

## 2020-08-20 ENCOUNTER — Other Ambulatory Visit: Payer: Self-pay

## 2020-08-20 DIAGNOSIS — M25661 Stiffness of right knee, not elsewhere classified: Secondary | ICD-10-CM

## 2020-08-20 DIAGNOSIS — R262 Difficulty in walking, not elsewhere classified: Secondary | ICD-10-CM

## 2020-08-20 DIAGNOSIS — R600 Localized edema: Secondary | ICD-10-CM

## 2020-08-20 DIAGNOSIS — M25561 Pain in right knee: Secondary | ICD-10-CM | POA: Diagnosis not present

## 2020-08-20 NOTE — Telephone Encounter (Signed)
Patient aware note at front desk  

## 2020-08-20 NOTE — Telephone Encounter (Signed)
Patient is requesting a note for jury duty she needs note to state she had a knee replacement she has jury duty on Tuesday she needs it before then if possible she will come pick it up call back:[phone]

## 2020-08-20 NOTE — Telephone Encounter (Signed)
I am fine with her having a note keeping her out of jury duty.  I did state that she is in the postoperative recovery.  From knee replacement surgery and is not cleared to drive.  Also she is still on narcotic pain medications which can cause lightheadedness and decreased concentration.  She needs to be held out of jury duty for at least the next 3 months.

## 2020-08-20 NOTE — Therapy (Signed)
[place]. [place], [place], [address] Phone: [phone]   Fax:  [phone]  Physical Therapy Treatment  Patient Details  Name: Nancy Eaton MRN: [id number] Date of Birth: 11-20-1956 Referring Provider (PT): Hedy Camara Date: 08/20/2020   PT End of Session - 08/20/20 1008    Visit Number 8    Date for PT Re-Evaluation 10/04/20    PT Start Time 0930    PT Stop Time 1025    PT Time Calculation (min) 55 min           Past Medical History:  Diagnosis Date  . Arthritis    Knee  . Hyperlipidemia    Lovastatin started approx 2011  . IFG (impaired fasting glucose) 2018   HbA1c 5.9% (stable)  . PONV (postoperative nausea and vomiting)   . Pre-diabetes   . Right knee pain 12/2019   End stage: TKA recommended by ortho 01/2020->pt considering  . Sigmoid diverticulosis 04/2010   "     "     "    "    Past Surgical History:  Procedure Laterality Date  . BREAST ENHANCEMENT SURGERY     As of mammogram 12/2017--rupture of implants stable.  Marland Kitchen CARPAL TUNNEL RELEASE    . CESAREAN SECTION    . COLONOSCOPY  age 71   [facility]--recall 10 yrs  . TOTAL KNEE ARTHROPLASTY Right 07/17/2020   Procedure: RIGHT TOTAL KNEE ARTHROPLASTY;  Surgeon: Mcarthur Rossetti, MD;  Location: [facility];  Service: Orthopedics;  Laterality: Right;    There were no vitals filed for this visit.   Subjective Assessment - 08/20/20 0937    Subjective not as stiff, easier to move    Currently in Pain? Yes    Pain Score 2     Pain Location Knee    Pain Orientation Right              [facility] PT Assessment - 08/20/20 0001      AROM   AROM Assessment Site Knee    Right/Left Knee Right    Right Knee Flexion 90   taken after MT stretchs                        [facility] Adult PT Treatment/Exercise - 08/20/20 0001      Knee/Hip Exercises: Aerobic   Elliptical I 8 R 3 4 min     Recumbent Bike 5 min with  rocking and partial revolutions -after a couple min able to get ovef backward with compensation    Nustep L 4 58mn LE only      Knee/Hip Exercises: Machines for Strengthening   Cybex Knee Extension 5# 2 sets 8 RT LE only    Cybex Knee Flexion 20# 2 sets 10- RT LE only   cued to slow mvmt and increase ROM   Cybex Leg Press 20# 2 sets 10- cued for full TKE and quad activation   10x SL unlocked for ROM     Vasopneumatic   Number Minutes Vasopneumatic  15 minutes    Vasopnuematic Location  Knee    Vasopneumatic Pressure Medium    Vasopneumatic Temperature  34      Manual Therapy   Manual Therapy Passive ROM    Passive ROM knee flexion- cued to relax muscles                    PT  Short Term Goals - 08/07/20 0843      PT SHORT TERM GOAL #1   Title Pt will be independent with HEP    Status Achieved             PT Long Term Goals - 08/20/20 1007      PT LONG TERM GOAL #1   Title Pt will demo knee flexion to 110    Baseline 90    Status Partially Met      PT LONG TERM GOAL #2   Title Pt will demo knee extension to </= 3 deg    Status On-going      PT LONG TERM GOAL #3   Title Pt will ambulate >500 ft over level/unlevel surfaces independently with no AD    Baseline compensations    Status Partially Met                 Plan - 08/20/20 1009    Clinical Impression Statement pt is walking better with kne flexion in swing phase. fullbackward revolutions with compensations on bike. pt still ery resistant and pain limited with PROM ( stressed need to get better ROM to avoid manipulation) cued with ex for control of speed and engagement of muscles.    PT Treatment/Interventions ADLs/Self Care Home Management;Cryotherapy;Electrical Stimulation;Gait training;Stair training;Functional mobility training;Therapeutic activities;Therapeutic exercise;Balance training;Neuromuscular re-education;Manual techniques;Patient/family education;Scar mobilization;Passive range of  motion;Vasopneumatic Device    PT Next Visit Plan write MD note           Patient will benefit from skilled therapeutic intervention in order to improve the following deficits and impairments:  Abnormal gait, Decreased range of motion, Difficulty walking, Decreased endurance, Pain, Impaired flexibility, Increased edema  Visit Diagnosis: Stiffness of right knee, not elsewhere classified  Localized edema  Difficulty in walking, not elsewhere classified     Problem List Patient Active Problem List   Diagnosis Date Noted  . Status post total right knee replacement 07/17/2020  . Unilateral primary osteoarthritis, right knee 12/30/2019  . Acute right-sided low back pain with right-sided sciatica 12/25/2017  . Health maintenance examination 09/30/2013  . Hyperlipidemia 09/30/2013    Nancy Eaton,ANGIE PTA 08/20/2020, 10:11 AM  [place]. [place], [place], [address] Phone: [phone]   Fax:  [phone]  Name: Nancy Eaton MRN: [id number] Date of Birth: January 12, 1956

## 2020-08-24 ENCOUNTER — Other Ambulatory Visit: Payer: Self-pay

## 2020-08-24 ENCOUNTER — Encounter: Payer: Self-pay | Admitting: Physical Therapy

## 2020-08-24 ENCOUNTER — Ambulatory Visit: Payer: Medicare Other | Attending: Orthopaedic Surgery | Admitting: Physical Therapy

## 2020-08-24 DIAGNOSIS — M25661 Stiffness of right knee, not elsewhere classified: Secondary | ICD-10-CM | POA: Diagnosis present

## 2020-08-24 DIAGNOSIS — M25561 Pain in right knee: Secondary | ICD-10-CM | POA: Insufficient documentation

## 2020-08-24 DIAGNOSIS — R262 Difficulty in walking, not elsewhere classified: Secondary | ICD-10-CM | POA: Diagnosis present

## 2020-08-24 DIAGNOSIS — R600 Localized edema: Secondary | ICD-10-CM | POA: Diagnosis present

## 2020-08-24 NOTE — Therapy (Signed)
[place]. [place], [place], [address] Phone: [phone]   Fax:  [phone]  Physical Therapy Treatment  Patient Details  Name: Nancy Eaton MRN: [id number] Date of Birth: October 11, 1956 Referring Provider (PT): Hedy Camara Date: 08/24/2020   PT End of Session - 08/24/20 0852    Visit Number 9    Date for PT Re-Evaluation 10/04/20    PT Start Time 0807    PT Stop Time 0857    PT Time Calculation (min) 50 min    Activity Tolerance Patient tolerated treatment well    Behavior During Therapy [facility] for tasks assessed/performed           Past Medical History:  Diagnosis Date  . Arthritis    Knee  . Hyperlipidemia    Lovastatin started approx 2011  . IFG (impaired fasting glucose) 2018   HbA1c 5.9% (stable)  . PONV (postoperative nausea and vomiting)   . Pre-diabetes   . Right knee pain 12/2019   End stage: TKA recommended by ortho 01/2020->pt considering  . Sigmoid diverticulosis 04/2010   "     "     "    "    Past Surgical History:  Procedure Laterality Date  . BREAST ENHANCEMENT SURGERY     As of mammogram 12/2017--rupture of implants stable.  Marland Kitchen CARPAL TUNNEL RELEASE    . CESAREAN SECTION    . COLONOSCOPY  age 46   [facility]--recall 10 yrs  . TOTAL KNEE ARTHROPLASTY Right 07/17/2020   Procedure: RIGHT TOTAL KNEE ARTHROPLASTY;  Surgeon: Mcarthur Rossetti, MD;  Location: [facility];  Service: Orthopedics;  Laterality: Right;    There were no vitals filed for this visit.   Subjective Assessment - 08/24/20 0811    Subjective Pt reports doing well; still stiff    Currently in Pain? No/denies    Pain Score 0-No pain    Pain Location Knee    Pain Orientation Right              [facility] PT Assessment - 08/24/20 0001      AROM   AROM Assessment Site Knee    Right/Left Knee Right    Right Knee Extension 7    Right Knee Flexion 85      PROM   PROM Assessment Site Knee     Right/Left Knee Right    Right Knee Extension 5    Right Knee Flexion 90                         [facility] Adult PT Treatment/Exercise - 08/24/20 0001      Knee/Hip Exercises: Aerobic   Elliptical I 8 R 3 4 min     Recumbent Bike 6 min w/ excessive leaning and partial revolutions    Nustep L 4 81mn LE only      Knee/Hip Exercises: Machines for Strengthening   Cybex Knee Extension 5# 2x10 RLE   cues for full ROM   Cybex Knee Flexion 20# 2x10 RLE   cues for full knee flexion     Vasopneumatic   Number Minutes Vasopneumatic  15 minutes    Vasopnuematic Location  Knee    Vasopneumatic Pressure Medium    Vasopneumatic Temperature  34      Manual Therapy   Manual Therapy Passive ROM    Joint Mobilization gentle distraction    Passive ROM knee flexion/extension  PT Short Term Goals - 08/07/20 0843      PT SHORT TERM GOAL #1   Title Pt will be independent with HEP    Status Achieved             PT Long Term Goals - 08/20/20 1007      PT LONG TERM GOAL #1   Title Pt will demo knee flexion to 110    Baseline 90    Status Partially Met      PT LONG TERM GOAL #2   Title Pt will demo knee extension to </= 3 deg    Status On-going      PT LONG TERM GOAL #3   Title Pt will ambulate >500 ft over level/unlevel surfaces independently with no AD    Baseline compensations    Status Partially Met                 Plan - 08/24/20 0853    Clinical Impression Statement Pt demos significant difficulty with active and passive knee flexion; requires cues for increased ROM with interventions and to relax with manual stretching. Pt demos increased knee flexion with gait this rx. Continued education on importance of pushing knee flexion with pt VU. Continue to push ROM/function.    PT Treatment/Interventions ADLs/Self Care Home Management;Cryotherapy;Electrical Stimulation;Gait training;Stair training;Functional mobility training;Therapeutic  activities;Therapeutic exercise;Balance training;Neuromuscular re-education;Manual techniques;Patient/family education;Scar mobilization;Passive range of motion;Vasopneumatic Device    PT Next Visit Plan push ROM/function    Consulted and Agree with Plan of Care Patient           Patient will benefit from skilled therapeutic intervention in order to improve the following deficits and impairments:  Abnormal gait, Decreased range of motion, Difficulty walking, Decreased endurance, Pain, Impaired flexibility, Increased edema  Visit Diagnosis: Stiffness of right knee, not elsewhere classified  Localized edema  Difficulty in walking, not elsewhere classified  Acute pain of right knee     Problem List Patient Active Problem List   Diagnosis Date Noted  . Status post total right knee replacement 07/17/2020  . Unilateral primary osteoarthritis, right knee 12/30/2019  . Acute right-sided low back pain with right-sided sciatica 12/25/2017  . Health maintenance examination 09/30/2013  . Hyperlipidemia 09/30/2013   Amador Cunas, PT, DPT Donald Prose Brandilyn Nanninga 08/24/2020, 8:58 AM  [place]. [place], [place], [address] Phone: [phone]   Fax:  [phone]  Name: Nancy Eaton MRN: [id number] Date of Birth: 02-May-1956

## 2020-08-26 ENCOUNTER — Ambulatory Visit (INDEPENDENT_AMBULATORY_CARE_PROVIDER_SITE_OTHER): Payer: Medicare Other | Admitting: Orthopaedic Surgery

## 2020-08-26 ENCOUNTER — Encounter: Payer: Self-pay | Admitting: Orthopaedic Surgery

## 2020-08-26 ENCOUNTER — Other Ambulatory Visit: Payer: Self-pay

## 2020-08-26 DIAGNOSIS — Z96651 Presence of right artificial knee joint: Secondary | ICD-10-CM

## 2020-08-26 DIAGNOSIS — T8482XA Fibrosis due to internal orthopedic prosthetic devices, implants and grafts, initial encounter: Secondary | ICD-10-CM | POA: Insufficient documentation

## 2020-08-26 DIAGNOSIS — T8482XD Fibrosis due to internal orthopedic prosthetic devices, implants and grafts, subsequent encounter: Secondary | ICD-10-CM

## 2020-08-26 NOTE — Progress Notes (Signed)
The patient is a very pleasant 64 year old female who is now about 6 weeks out from a right total knee arthroplasty.  She reports a good extension but has limitations with flexion.  She is walking without assistive device and looks good overall.  Examination of her right knee shows a well-healed surgical incision.  Her extension is almost completely full but her flexion is only to about 90 degrees.  At this point I am recommending a manipulation under anesthesia.  She agrees with this as well.  I explained the rationale behind this type of procedure as well as the risk and benefits involved.  She does agree with this.  She would also like to have her physical therapy done after this here at [facility].  We can have that set up as well.  She understands we want her to have aggressive physical therapy after this procedure.  All question concerns were answered and addressed.  We will see her back at least 1 to 2 weeks postoperative after the manipulation.

## 2020-09-03 ENCOUNTER — Other Ambulatory Visit: Payer: Self-pay | Admitting: Orthopaedic Surgery

## 2020-09-03 ENCOUNTER — Encounter: Payer: Self-pay | Admitting: Orthopaedic Surgery

## 2020-09-03 DIAGNOSIS — M24661 Ankylosis, right knee: Secondary | ICD-10-CM | POA: Diagnosis not present

## 2020-09-03 MED ORDER — ONDANSETRON 4 MG PO TBDP: 4.0000 mg | ORAL_TABLET | Freq: Three times a day (TID) | ORAL | 0 refills | Status: DC | PRN

## 2020-09-03 MED ORDER — HYDROCODONE-ACETAMINOPHEN 5-325 MG PO TABS: 1.0000 | ORAL_TABLET | Freq: Four times a day (QID) | ORAL | 0 refills | Status: DC | PRN

## 2020-09-07 ENCOUNTER — Other Ambulatory Visit: Payer: Self-pay

## 2020-09-07 ENCOUNTER — Ambulatory Visit (INDEPENDENT_AMBULATORY_CARE_PROVIDER_SITE_OTHER): Payer: Medicare Other | Admitting: Rehabilitative and Restorative Service Providers"

## 2020-09-07 ENCOUNTER — Encounter: Payer: Self-pay | Admitting: Rehabilitative and Restorative Service Providers"

## 2020-09-07 DIAGNOSIS — R600 Localized edema: Secondary | ICD-10-CM

## 2020-09-07 DIAGNOSIS — M25661 Stiffness of right knee, not elsewhere classified: Secondary | ICD-10-CM | POA: Diagnosis not present

## 2020-09-07 DIAGNOSIS — G8929 Other chronic pain: Secondary | ICD-10-CM

## 2020-09-07 DIAGNOSIS — M6281 Muscle weakness (generalized): Secondary | ICD-10-CM

## 2020-09-07 DIAGNOSIS — M25561 Pain in right knee: Secondary | ICD-10-CM | POA: Diagnosis not present

## 2020-09-07 DIAGNOSIS — R262 Difficulty in walking, not elsewhere classified: Secondary | ICD-10-CM

## 2020-09-07 NOTE — Patient Instructions (Signed)
Tailgate knee flexion- Sit in a chair with a rolled up towel under your R thigh near your knee.  Swing your leg back and forth trying to get more bend in the R knee.  3 minutes AT LEAST 5X/Day!!!  Self-overpressure into knee extension- With R foot in a chair and arm rest blocking your foot from rolling out, push down on your thigh just ABOVE your knee (IE pushing the knee straight).  Set a timer for 3 minutes and do a MINIMUM of 5X/Day!!!  Bike at the gym.  Seat set way back working the pedals back and forth to gain more motion in the R knee.

## 2020-09-07 NOTE — Therapy (Signed)
[facility] [address] [place], [place], [address] Phone: [phone]   Fax:  [phone]  Physical Therapy Treatment  Patient Details  Name: Nancy Eaton MRN: [id number] Date of Birth: 09/21/1956 Referring Provider (PT): Merri Brunette Date: 09/07/2020   PT End of Session - 09/07/20 1619    Visit Number 10    Date for PT Re-Evaluation 10/04/20    PT Start Time 1430    PT Stop Time 1515    PT Time Calculation (min) 45 min    Activity Tolerance Patient tolerated treatment well;Patient limited by pain    Behavior During Therapy [facility] for tasks assessed/performed           Past Medical History:  Diagnosis Date  . Arthritis    Knee  . Hyperlipidemia    Lovastatin started approx 2011  . IFG (impaired fasting glucose) 2018   HbA1c 5.9% (stable)  . PONV (postoperative nausea and vomiting)   . Pre-diabetes   . Right knee pain 12/2019   End stage: TKA recommended by ortho 01/2020->pt considering  . Sigmoid diverticulosis 04/2010   "     "     "    "    Past Surgical History:  Procedure Laterality Date  . BREAST ENHANCEMENT SURGERY     As of mammogram 12/2017--rupture of implants stable.  Marland Kitchen CARPAL TUNNEL RELEASE    . CESAREAN SECTION    . COLONOSCOPY  age 66   [facility]--recall 10 yrs  . TOTAL KNEE ARTHROPLASTY Right 07/17/2020   Procedure: RIGHT TOTAL KNEE ARTHROPLASTY;  Surgeon: Kathryne Hitch, MD;  Location: [facility];  Service: Orthopedics;  Laterality: Right;    There were no vitals filed for this visit.   Subjective Assessment - 09/07/20 1616    Subjective Nancy Eaton still "feels stiff" after her surgical manipulation Thursday.    Currently in Pain? Yes    Pain Score 5     Pain Location Knee    Pain Orientation Right    Pain Descriptors / Indicators Aching;Sore;Tightness    Pain Type Surgical pain;Chronic pain    Pain Onset More than a month ago    Pain Frequency Constant    Aggravating Factors  WB and  flexion    Effect of Pain on Daily Activities Limits her ability to stand at work and walk for normal ADLs    Multiple Pain Sites No              [facility] PT Assessment - 09/07/20 0001      AROM   AROM Assessment Site Knee    Right/Left Knee Right    Right Knee Extension 5    Right Knee Flexion 73                         [facility] Adult PT Treatment/Exercise - 09/07/20 0001      Exercises   Exercises Knee/Hip      Knee/Hip Exercises: Aerobic   Stationary Bike Seat 8 for 10 minutes AROM (back and forth with 5 second hold at flexed position)      Knee/Hip Exercises: Seated   Other Seated Knee/Hip Exercises Tailgate knee flexion AROM 2X 3 minutes    Other Seated Knee/Hip Exercises Seated self-overpressure into extension with foot in chair/arm rest preventing hip ER      Manual Therapy   Manual Therapy Passive ROM    Manual therapy comments flexion and extension 5 minutes  PT Education - 09/07/20 1617    Education Details Reviewed the importance of consistent compliance with her AROM activities (2 prescribed today) to maintain and gain after recent manipulation (4 days ago).    Person(s) Educated Patient    Methods Explanation;Demonstration;Tactile cues;Verbal cues;Handout    Comprehension Need further instruction;Returned demonstration;Verbal cues required;Verbalized understanding;Tactile cues required            PT Short Term Goals - 08/07/20 0843      PT SHORT TERM GOAL #1   Title Pt will be independent with HEP    Status Achieved             PT Long Term Goals - 09/07/20 1618      PT LONG TERM GOAL #1   Title Pt will demo knee flexion to 110    Baseline 90    Status On-going    Target Date 09/29/20      PT LONG TERM GOAL #2   Title Pt will demo knee extension to </= 3 deg    Status On-going    Target Date 09/29/20      PT LONG TERM GOAL #3   Title Pt will ambulate >500 ft over level/unlevel surfaces independently  with no AD    Baseline compensations    Status On-going    Target Date 09/29/20      PT LONG TERM GOAL #4   Status On-going    Target Date 09/29/20      PT LONG TERM GOAL #5   Status On-going    Target Date 09/29/20                 Plan - 09/07/20 1622    Clinical Impression Statement Nancy Eaton had a manipulation of her R knee last Thursday (09/03/2020).  Her AROM is limited today and we stressed the importance of consistent (minimum 5X/day compliance) with her 2 exercise HEP (1 flexion AROM and 1 extension AROM activity).  She was also encouraged to use the stationary bike at her gym to work on AROM as often as she can (daily recommended) for 10-20 minutes in addition to her HEP.  AROM is the emphasis to maintain and build on AROM post-manipulation.    Examination-Activity Limitations Locomotion Level;Stand    Examination-Participation Restrictions Community Activity;Interpersonal Relationship    Stability/Clinical Decision Making Stable/Uncomplicated    Clinical Decision Making Low    Rehab Potential Good    PT Frequency 3x / week    PT Duration 4 weeks    PT Treatment/Interventions ADLs/Self Care Home Management;Cryotherapy;Electrical Stimulation;Gait training;Stair training;Functional mobility training;Therapeutic activities;Therapeutic exercise;Balance training;Neuromuscular re-education;Manual techniques;Patient/family education;Scar mobilization;Passive range of motion;Vasopneumatic Device    PT Next Visit Plan AROM    PT Home Exercise Plan Tailgate knee flexion, self overpressure into extension and stationary bike    Consulted and Agree with Plan of Care Patient           Patient will benefit from skilled therapeutic intervention in order to improve the following deficits and impairments:  Abnormal gait, Decreased range of motion, Difficulty walking, Decreased endurance, Pain, Impaired flexibility, Increased edema  Visit Diagnosis: Difficulty walking  Stiffness of  right knee, not elsewhere classified  Localized edema  Chronic pain of right knee  Muscle weakness (generalized)     Problem List Patient Active Problem List   Diagnosis Date Noted  . Arthrofibrosis of total knee replacement (HCC) 08/26/2020  . Status post total right knee replacement 07/17/2020  . Unilateral primary osteoarthritis, right  knee 12/30/2019  . Acute right-sided low back pain with right-sided sciatica 12/25/2017  . Health maintenance examination 09/30/2013  . Hyperlipidemia 09/30/2013    Cherlyn Cushing PT, MPT 09/07/2020, 4:27 PM  [facility] [address] [place], [place], [address] Phone: [phone]   Fax:  [phone]  Name: Nancy Eaton MRN: [id number] Date of Birth: Jan 25, 1956

## 2020-09-09 ENCOUNTER — Encounter: Payer: Self-pay | Admitting: Physical Therapy

## 2020-09-09 ENCOUNTER — Encounter: Payer: Medicare Other | Admitting: Physical Therapy

## 2020-09-09 ENCOUNTER — Ambulatory Visit: Payer: Medicare Other | Admitting: Physical Therapy

## 2020-09-09 ENCOUNTER — Other Ambulatory Visit: Payer: Self-pay

## 2020-09-09 DIAGNOSIS — M25561 Pain in right knee: Secondary | ICD-10-CM | POA: Diagnosis not present

## 2020-09-09 DIAGNOSIS — R600 Localized edema: Secondary | ICD-10-CM

## 2020-09-09 DIAGNOSIS — M6281 Muscle weakness (generalized): Secondary | ICD-10-CM

## 2020-09-09 DIAGNOSIS — M25661 Stiffness of right knee, not elsewhere classified: Secondary | ICD-10-CM

## 2020-09-09 DIAGNOSIS — R262 Difficulty in walking, not elsewhere classified: Secondary | ICD-10-CM | POA: Diagnosis not present

## 2020-09-09 DIAGNOSIS — G8929 Other chronic pain: Secondary | ICD-10-CM

## 2020-09-09 NOTE — Therapy (Signed)
[facility] Physical Therapy [address] [place], [place], [address] Phone: [phone]   Fax:  [phone]  Physical Therapy Treatment  Patient Details  Name: Nancy Eaton MRN: [id number] Date of Birth: 1956/05/02 Referring Provider (PT): Magnus Ivan   Encounter Date: 09/09/2020   PT End of Session - 09/09/20 1008    Visit Number 11    Date for PT Re-Evaluation 10/04/20    PT Start Time 0930    PT Stop Time 1010    PT Time Calculation (min) 40 min    Activity Tolerance Patient tolerated treatment well;Patient limited by pain    Behavior During Therapy [facility] for tasks assessed/performed           Past Medical History:  Diagnosis Date   Arthritis    Knee   Hyperlipidemia    Lovastatin started approx 2011   IFG (impaired fasting glucose) 2018   HbA1c 5.9% (stable)   PONV (postoperative nausea and vomiting)    Pre-diabetes    Right knee pain 12/2019   End stage: TKA recommended by ortho 01/2020->pt considering   Sigmoid diverticulosis 04/2010   "     "     "    "    Past Surgical History:  Procedure Laterality Date   BREAST ENHANCEMENT SURGERY     As of mammogram 12/2017--rupture of implants stable.   CARPAL TUNNEL RELEASE     CESAREAN SECTION     COLONOSCOPY  age 31   [facility]--recall 10 yrs   TOTAL KNEE ARTHROPLASTY Right 07/17/2020   Procedure: RIGHT TOTAL KNEE ARTHROPLASTY;  Surgeon: Kathryne Hitch, MD;  Location: [facility];  Service: Orthopedics;  Laterality: Right;    There were no vitals filed for this visit.   Subjective Assessment - 09/09/20 0934    Subjective knee is stiff today, was doing well yesterday.    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 Yes    Pain Score 2     Pain Location Knee    Pain Orientation Right    Pain Descriptors / Indicators Tightness    Pain Type Surgical pain;Chronic pain    Pain Onset More than a month ago    Pain Frequency  Constant    Aggravating Factors  WB and flexion    Pain Relieving Factors ice, rest                             [facility] Adult PT Treatment/Exercise - 09/09/20 0936      Knee/Hip Exercises: Stretches   Passive Hamstring Stretch Right;5 reps;30 seconds    Passive Hamstring Stretch Limitations seated with overpressure    Knee: Self-Stretch Limitations LLE providing overpressure: 10x20 sec    Other Knee/Hip Stretches seated tailgate knee flexion x 2-3 min      Knee/Hip Exercises: Aerobic   Recumbent Bike partial revolutions x 8 min, seat 5      Manual Therapy   Passive ROM seated Rt knee flexion; including contract/relax                    PT Short Term Goals - 08/07/20 0843      PT SHORT TERM GOAL #1   Title Pt will be independent with HEP    Status Achieved             PT Long Term Goals -  09/07/20 1618      PT LONG TERM GOAL #1   Title Pt will demo knee flexion to 110    Baseline 90    Status On-going    Target Date 09/29/20      PT LONG TERM GOAL #2   Title Pt will demo knee extension to </= 3 deg    Status On-going    Target Date 09/29/20      PT LONG TERM GOAL #3   Title Pt will ambulate >500 ft over level/unlevel surfaces independently with no AD    Baseline compensations    Status On-going    Target Date 09/29/20      PT LONG TERM GOAL #4   Status On-going    Target Date 09/29/20      PT LONG TERM GOAL #5   Status On-going    Target Date 09/29/20                 Plan - 09/09/20 1010    Clinical Impression Statement Pt tolerated session well today and overall progressing with her ROM.  Will continue to benefit from PT to maximize function.    Examination-Activity Limitations Locomotion Level;Stand    Examination-Participation Restrictions Community Activity;Interpersonal Relationship    Stability/Clinical Decision Making Stable/Uncomplicated    Rehab Potential Good    PT Frequency 3x / week    PT Duration 4 weeks     PT Treatment/Interventions ADLs/Self Care Home Management;Cryotherapy;Electrical Stimulation;Gait training;Stair training;Functional mobility training;Therapeutic activities;Therapeutic exercise;Balance training;Neuromuscular re-education;Manual techniques;Patient/family education;Scar mobilization;Passive range of motion;Vasopneumatic Device    PT Next Visit Plan AROM, needs to schedule more PT appts, measure    PT Home Exercise Plan Tailgate knee flexion, self overpressure into extension and stationary bike    Consulted and Agree with Plan of Care Patient           Patient will benefit from skilled therapeutic intervention in order to improve the following deficits and impairments:  Abnormal gait, Decreased range of motion, Difficulty walking, Decreased endurance, Pain, Impaired flexibility, Increased edema  Visit Diagnosis: Difficulty walking  Stiffness of right knee, not elsewhere classified  Localized edema  Chronic pain of right knee  Muscle weakness (generalized)     Problem List Patient Active Problem List   Diagnosis Date Noted   Arthrofibrosis of total knee replacement ([facility]) 08/26/2020   Status post total right knee replacement 07/17/2020   Unilateral primary osteoarthritis, right knee 12/30/2019   Acute right-sided low back pain with right-sided sciatica 12/25/2017   Health maintenance examination 09/30/2013   Hyperlipidemia 09/30/2013      Clarita Crane, PT, DPT 09/09/20 10:11 AM    [facility] Physical Therapy [address] [place], [place], [address] Phone: [phone]   Fax:  [phone]  Name: Nancy Eaton MRN: [id number] Date of Birth: [birth year]

## 2020-09-11 ENCOUNTER — Ambulatory Visit (INDEPENDENT_AMBULATORY_CARE_PROVIDER_SITE_OTHER): Payer: Medicare Other | Admitting: Physical Therapy

## 2020-09-11 ENCOUNTER — Other Ambulatory Visit: Payer: Self-pay

## 2020-09-11 DIAGNOSIS — R600 Localized edema: Secondary | ICD-10-CM

## 2020-09-11 DIAGNOSIS — M25661 Stiffness of right knee, not elsewhere classified: Secondary | ICD-10-CM | POA: Diagnosis not present

## 2020-09-11 DIAGNOSIS — M6281 Muscle weakness (generalized): Secondary | ICD-10-CM

## 2020-09-11 DIAGNOSIS — M25561 Pain in right knee: Secondary | ICD-10-CM | POA: Diagnosis not present

## 2020-09-11 DIAGNOSIS — G8929 Other chronic pain: Secondary | ICD-10-CM

## 2020-09-11 DIAGNOSIS — R262 Difficulty in walking, not elsewhere classified: Secondary | ICD-10-CM | POA: Diagnosis not present

## 2020-09-11 NOTE — Therapy (Signed)
[facility] Physical Therapy [address] [place], [place], [address] Phone: [phone]   Fax:  [phone]  Physical Therapy Treatment  Patient Details  Name: Nancy Eaton MRN: [id number] Date of Birth: [birth year] Referring Provider (PT): Hedy Camara Date: 09/11/2020   PT End of Session - 09/11/20 0829    Visit Number 12    Date for PT Re-Evaluation 10/04/20    PT Start Time 0801    PT Stop Time 3845    PT Time Calculation (min) 54 min    Activity Tolerance Patient tolerated treatment well;Patient limited by pain    Behavior During Therapy [facility] for tasks assessed/performed           Past Medical History:  Diagnosis Date  . Arthritis    Knee  . Hyperlipidemia    Lovastatin started approx 2011  . IFG (impaired fasting glucose) 2018   HbA1c 5.9% (stable)  . PONV (postoperative nausea and vomiting)   . Pre-diabetes   . Right knee pain 12/2019   End stage: TKA recommended by ortho 01/2020->pt considering  . Sigmoid diverticulosis 04/2010   "     "     "    "    Past Surgical History:  Procedure Laterality Date  . BREAST ENHANCEMENT SURGERY     As of mammogram 12/2017--rupture of implants stable.  Marland [facility] CARPAL TUNNEL RELEASE    . CESAREAN SECTION    . COLONOSCOPY  age 80   [facility]--recall 10 yrs  . TOTAL KNEE ARTHROPLASTY Right 07/17/2020   Procedure: RIGHT TOTAL KNEE ARTHROPLASTY;  Surgeon: Mcarthur Rossetti, MD;  Location: [facility];  Service: Orthopedics;  Laterality: Right;    There were no vitals filed for this visit.   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, be able to bend knee    Pain Onset More than a month ago              [facility] PT Assessment - 09/11/20 0001      Assessment   Medical Diagnosis R TKA    Referring  Provider (PT) Ninfa Linden    Onset Date/Surgical Date 07/17/20      AROM   Right Knee Extension 5      PROM   Right Knee Extension 3    Right Knee Flexion 93                         [facility] Adult PT Treatment/Exercise - 09/11/20 0001      Knee/Hip Exercises: Stretches   Sports administrator Limitations prone quad stretch with strap 1 min X 3 reps    Knee: Self-Stretch Limitations LLE providing overpressure: 10 sec X 3 min    Other Knee/Hip Stretches standing lunge      Knee/Hip Exercises: Aerobic   Recumbent Bike partial revolutions x 8 min, seat 5      Knee/Hip Exercises: Standing   Lateral Step Up Right;10 reps;Hand Hold: 1;Step Height: 6"    Forward Step Up Right;15 reps;Hand Hold: 1;Step Height: 6"    Step Down Right;10 reps;Hand Hold: 1;Step Height: 6"      Vasopneumatic   Number Minutes Vasopneumatic  10 minutes    Vasopnuematic Location  Knee    Vasopneumatic Pressure Medium  Vasopneumatic Temperature  34      Manual Therapy   Passive ROM Rt knee PROM flexion and extension, flexion mobs, MET contract and relax stretching                    PT Short Term Goals - 08/07/20 0843      PT SHORT TERM GOAL #1   Title Pt will be independent with HEP    Status Achieved             PT Long Term Goals - 09/07/20 1618      PT LONG TERM GOAL #1   Title Pt will demo knee flexion to 110    Baseline 90    Status On-going    Target Date 09/29/20      PT LONG TERM GOAL #2   Title Pt will demo knee extension to </= 3 deg    Status On-going    Target Date 09/29/20      PT LONG TERM GOAL #3   Title Pt will ambulate >500 ft over level/unlevel surfaces independently with no AD    Baseline compensations    Status On-going    Target Date 09/29/20      PT LONG TERM GOAL #4   Status On-going    Target Date 09/29/20      PT LONG TERM GOAL #5   Status On-going    Target Date 09/29/20                 Plan - 09/11/20 0830    Clinical  Impression Statement Worked aggressively to improve ROM as tolerated. This is progressing very slow and she was encouraged to stretch more at home as much as tolerated.    Examination-Activity Limitations Locomotion Level;Stand    Examination-Participation Restrictions Community Activity;Interpersonal Relationship    Stability/Clinical Decision Making Stable/Uncomplicated    Rehab Potential Good    PT Frequency 3x / week    PT Duration 4 weeks    PT Treatment/Interventions ADLs/Self Care Home Management;Cryotherapy;Electrical Stimulation;Gait training;Stair training;Functional mobility training;Therapeutic activities;Therapeutic exercise;Balance training;Neuromuscular re-education;Manual techniques;Patient/family education;Scar mobilization;Passive range of motion;Vasopneumatic Device    PT Next Visit Plan needs MD note    PT Home Exercise Plan Tailgate knee flexion, self overpressure into extension and stationary bike    Consulted and Agree with Plan of Care Patient           Patient will benefit from skilled therapeutic intervention in order to improve the following deficits and impairments:  Abnormal gait, Decreased range of motion, Difficulty walking, Decreased endurance, Pain, Impaired flexibility, Increased edema  Visit Diagnosis: Difficulty walking  Stiffness of right knee, not elsewhere classified  Localized edema  Chronic pain of right knee  Muscle weakness (generalized)  Difficulty in walking, not elsewhere classified     Problem List Patient Active Problem List   Diagnosis Date Noted  . Arthrofibrosis of total knee replacement ([place]) 08/26/2020  . Status post total right knee replacement 07/17/2020  . Unilateral primary osteoarthritis, right knee 12/30/2019  . Acute right-sided low back pain with right-sided sciatica 12/25/2017  . Health maintenance examination 09/30/2013  . Hyperlipidemia 09/30/2013    Silvestre Mesi 09/11/2020, 8:47 AM  [facility] Physical Therapy [address] [place], [place], [address] Phone: [phone]   Fax:  [phone]  Name: Eugene Zeiders MRN: [id number] Date of Birth: September 29, 1956

## 2020-09-14 ENCOUNTER — Encounter: Payer: Self-pay | Admitting: Physical Therapy

## 2020-09-14 ENCOUNTER — Ambulatory Visit (INDEPENDENT_AMBULATORY_CARE_PROVIDER_SITE_OTHER): Payer: Medicare Other | Admitting: Physical Therapy

## 2020-09-14 ENCOUNTER — Encounter: Payer: Self-pay | Admitting: Orthopaedic Surgery

## 2020-09-14 ENCOUNTER — Ambulatory Visit (INDEPENDENT_AMBULATORY_CARE_PROVIDER_SITE_OTHER): Payer: Medicare Other | Admitting: Orthopaedic Surgery

## 2020-09-14 ENCOUNTER — Other Ambulatory Visit: Payer: Self-pay

## 2020-09-14 DIAGNOSIS — R262 Difficulty in walking, not elsewhere classified: Secondary | ICD-10-CM | POA: Diagnosis not present

## 2020-09-14 DIAGNOSIS — R600 Localized edema: Secondary | ICD-10-CM | POA: Diagnosis not present

## 2020-09-14 DIAGNOSIS — Z96651 Presence of right artificial knee joint: Secondary | ICD-10-CM

## 2020-09-14 DIAGNOSIS — M25661 Stiffness of right knee, not elsewhere classified: Secondary | ICD-10-CM | POA: Diagnosis not present

## 2020-09-14 DIAGNOSIS — M25561 Pain in right knee: Secondary | ICD-10-CM

## 2020-09-14 DIAGNOSIS — G8929 Other chronic pain: Secondary | ICD-10-CM

## 2020-09-14 DIAGNOSIS — M6281 Muscle weakness (generalized): Secondary | ICD-10-CM

## 2020-09-14 DIAGNOSIS — T8482XD Fibrosis due to internal orthopedic prosthetic devices, implants and grafts, subsequent encounter: Secondary | ICD-10-CM

## 2020-09-14 NOTE — Progress Notes (Signed)
The patient is someone I am seeing in follow-up status post a right total knee arthroplasty.  This week she will be 2 months since we did her surgery.  I took her to the operating room last week for manipulation under anesthesia.  She reports that she is getting better.  She said that physical therapy was able to flex her to 93 degrees today.  On my examination I could only flex her to about 90 degrees.  I did show her our intraoperative photographs of her knee where we had flexed her to at least 110 degrees and beyond.  She will continue to push herself hard with physical therapy.  We will see her back in 4 weeks for repeat exam of her range of motion.

## 2020-09-14 NOTE — Therapy (Signed)
[facility] Physical Therapy [address] [place], [place], [address] Phone: [phone]   Fax:  [phone]  Physical Therapy Treatment/MD note  Patient Details  Name: Nancy Eaton MRN: [id number] Date of Birth: 1956/03/13 Referring Provider (PT): Ninfa Linden   Encounter Date: 09/14/2020   PT End of Session - 09/14/20 1037    Visit Number 13    Date for PT Re-Evaluation 10/04/20    PT Start Time 1030    PT Stop Time 1115    PT Time Calculation (min) 45 min    Activity Tolerance Patient tolerated treatment well;Patient limited by pain    Behavior During Therapy [facility] for tasks assessed/performed           Past Medical History:  Diagnosis Date   Arthritis    Knee   Hyperlipidemia    Lovastatin started approx 2011   IFG (impaired fasting glucose) 2018   HbA1c 5.9% (stable)   PONV (postoperative nausea and vomiting)    Pre-diabetes    Right knee pain 12/2019   End stage: TKA recommended by ortho 01/2020->pt considering   Sigmoid diverticulosis 04/2010   "     "     "    "    Past Surgical History:  Procedure Laterality Date   BREAST ENHANCEMENT SURGERY     As of mammogram 12/2017--rupture of implants stable.   CARPAL TUNNEL RELEASE     CESAREAN SECTION     COLONOSCOPY  age 44   [facility]--recall 10 yrs   TOTAL KNEE ARTHROPLASTY Right 07/17/2020   Procedure: RIGHT TOTAL KNEE ARTHROPLASTY;  Surgeon: Mcarthur Rossetti, MD;  Location: [facility];  Service: Orthopedics;  Laterality: Right;    There were no vitals filed for this visit.   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, be able to bend knee    Pain Onset More than a month ago              [facility] PT Assessment - 09/14/20 0001      Assessment   Medical Diagnosis R TKA    Referring Provider  (PT) Ninfa Linden    Onset Date/Surgical Date 07/17/20    Next MD Visit 10/25      PROM   Right Knee Extension 3    Right Knee Flexion 93      Strength   Strength Assessment Site Knee    Right/Left Knee Right    Right Knee Flexion 4+/5    Right Knee Extension 4+/5                         [facility] Adult PT Treatment/Exercise - 09/14/20 0001      Knee/Hip Exercises: Stretches   Sports administrator Limitations prone quad stretch with strap 1 min X 3 reps    Knee: Self-Stretch Limitations LLE providing overpressure: 10 sec X 3 min    Other Knee/Hip Stretches standing lunge with foot on step 10 sec X 10 reps      Knee/Hip Exercises: Aerobic   Recumbent Bike partial revolutions x 6 min holding 5 seconds, seat 5      Knee/Hip Exercises: Machines for Strengthening   Total Gym Leg Press bilat push 75 lbs 2X15 Then Rt leg only 43 lbs 2X15  Knee/Hip Exercises: Standing   Lateral Step Up Right;10 reps;Hand Hold: 1;Step Height: 6"    Forward Step Up Right;10 reps;Hand Hold: 1;Step Height: 6"    Step Down Right;10 reps;Hand Hold: 1;Step Height: 6"      Vasopneumatic   Number Minutes Vasopneumatic  10 minutes    Vasopnuematic Location  Knee    Vasopneumatic Pressure Medium    Vasopneumatic Temperature  34      Manual Therapy   Passive ROM Rt knee PROM flexion and extension, flexion mobs, MET contract and relax stretching                    PT Short Term Goals - 08/07/20 0843      PT SHORT TERM GOAL #1   Title Pt will be independent with HEP    Status Achieved             PT Long Term Goals - 09/07/20 1618      PT LONG TERM GOAL #1   Title Pt will demo knee flexion to 110    Baseline 90    Status On-going    Target Date 09/29/20      PT LONG TERM GOAL #2   Title Pt will demo knee extension to </= 3 deg    Status On-going    Target Date 09/29/20      PT LONG TERM GOAL #3   Title Pt will ambulate >500 ft over level/unlevel surfaces independently with  no AD    Baseline compensations    Status On-going    Target Date 09/29/20      PT LONG TERM GOAL #4   Status On-going    Target Date 09/29/20      PT LONG TERM GOAL #5   Status On-going    Target Date 09/29/20                 Plan - 09/14/20 1105    Clinical Impression Statement Continued to work agressively to improve ROM however she continues to be limited with this. Passively her Rt knee ROM is 3-93 deg. She will follow up with MD today.    Examination-Activity Limitations Locomotion Level;Stand    Examination-Participation Restrictions Community Activity;Interpersonal Relationship    Stability/Clinical Decision Making Stable/Uncomplicated    Rehab Potential Good    PT Frequency 3x / week    PT Duration 4 weeks    PT Treatment/Interventions ADLs/Self Care Home Management;Cryotherapy;Electrical Stimulation;Gait training;Stair training;Functional mobility training;Therapeutic activities;Therapeutic exercise;Balance training;Neuromuscular re-education;Manual techniques;Patient/family education;Scar mobilization;Passive range of motion;Vasopneumatic Device    PT Next Visit Plan needs MD note    PT Home Exercise Plan Tailgate knee flexion, self overpressure into extension and stationary bike    Consulted and Agree with Plan of Care Patient           Patient will benefit from skilled therapeutic intervention in order to improve the following deficits and impairments:  Abnormal gait, Decreased range of motion, Difficulty walking, Decreased endurance, Pain, Impaired flexibility, Increased edema  Visit Diagnosis: Difficulty walking  Stiffness of right knee, not elsewhere classified  Localized edema  Chronic pain of right knee  Muscle weakness (generalized)     Problem List Patient Active Problem List   Diagnosis Date Noted   Arthrofibrosis of total knee replacement ([place]) 08/26/2020   Status post total right knee replacement 07/17/2020   Unilateral primary  osteoarthritis, right knee 12/30/2019   Acute right-sided low back pain with right-sided sciatica 12/25/2017  Health maintenance examination 09/30/2013   Hyperlipidemia 09/30/2013    Silvestre Mesi 09/14/2020, 11:06 AM  [facility] Physical Therapy [address] [place], [place], [address] Phone: [phone]   Fax:  [phone]  Name: Nancy Eaton MRN: [id number] Date of Birth: [birth year]

## 2020-09-16 ENCOUNTER — Other Ambulatory Visit: Payer: Self-pay

## 2020-09-16 ENCOUNTER — Ambulatory Visit: Payer: Medicare Other | Admitting: Physical Therapy

## 2020-09-16 ENCOUNTER — Encounter: Payer: Self-pay | Admitting: Physical Therapy

## 2020-09-16 DIAGNOSIS — R600 Localized edema: Secondary | ICD-10-CM | POA: Diagnosis not present

## 2020-09-16 DIAGNOSIS — R262 Difficulty in walking, not elsewhere classified: Secondary | ICD-10-CM | POA: Diagnosis not present

## 2020-09-16 DIAGNOSIS — M25561 Pain in right knee: Secondary | ICD-10-CM | POA: Diagnosis not present

## 2020-09-16 DIAGNOSIS — M25661 Stiffness of right knee, not elsewhere classified: Secondary | ICD-10-CM | POA: Diagnosis not present

## 2020-09-16 DIAGNOSIS — G8929 Other chronic pain: Secondary | ICD-10-CM

## 2020-09-16 DIAGNOSIS — M6281 Muscle weakness (generalized): Secondary | ICD-10-CM

## 2020-09-16 NOTE — Therapy (Signed)
[facility] Physical Therapy [address] [place], [place], [address] Phone: [phone]   Fax:  [phone]  Physical Therapy Treatment  Patient Details  Name: Nancy Eaton MRN: [id number] Date of Birth: 06-10-1956 Referring Provider (PT): Ninfa Linden   Encounter Date: 09/16/2020   PT End of Session - 09/16/20 1340    Visit Number 14    Date for PT Re-Evaluation 10/04/20    PT Start Time 1300    PT Stop Time 1340    PT Time Calculation (min) 40 min    Activity Tolerance Patient tolerated treatment well;Patient limited by pain    Behavior During Therapy [facility] for tasks assessed/performed           Past Medical History:  Diagnosis Date   Arthritis    Knee   Hyperlipidemia    Lovastatin started approx 2011   IFG (impaired fasting glucose) 2018   HbA1c 5.9% (stable)   PONV (postoperative nausea and vomiting)    Pre-diabetes    Right knee pain 12/2019   End stage: TKA recommended by ortho 01/2020->pt considering   Sigmoid diverticulosis 04/2010   "     "     "    "    Past Surgical History:  Procedure Laterality Date   BREAST ENHANCEMENT SURGERY     As of mammogram 12/2017--rupture of implants stable.   CARPAL TUNNEL RELEASE     CESAREAN SECTION     COLONOSCOPY  age 27   [facility]--recall 10 yrs   TOTAL KNEE ARTHROPLASTY Right 07/17/2020   Procedure: RIGHT TOTAL KNEE ARTHROPLASTY;  Surgeon: Mcarthur Rossetti, MD;  Location: [facility];  Service: Orthopedics;  Laterality: Right;    There were no vitals filed for this visit.   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 No/denies    Pain Onset More than a month ago                             [facility] Adult PT Treatment/Exercise - 09/16/20 1303      Knee/Hip Exercises:  Stretches   Knee: Self-Stretch Limitations LLE providing overpressure: 10 sec X 3 min    Gastroc Stretch Both;3 reps;30 seconds    Gastroc Stretch Limitations slantboard    Other Knee/Hip Stretches seated tailgate flexion x 2-3 min      Knee/Hip Exercises: Aerobic   Nustep L5 x 10 min      Knee/Hip Exercises: Standing   Lateral Step Up Right;10 reps;Hand Hold: 1;Step Height: 6";Left    Forward Step Up Right;10 reps;Hand Hold: 1;Step Height: 6";Left      Manual Therapy   Passive ROM Rt knee PROM flexion and extension, flexion mobs, MET contract and relax stretching                    PT Short Term Goals - 08/07/20 0843      PT SHORT TERM GOAL #1   Title Pt will be independent with HEP    Status Achieved             PT Long Term Goals - 09/07/20 1618      PT LONG TERM GOAL #1   Title Pt will demo  knee flexion to 110    Baseline 90    Status On-going    Target Date 09/29/20      PT LONG TERM GOAL #2   Title Pt will demo knee extension to </= 3 deg    Status On-going    Target Date 09/29/20      PT LONG TERM GOAL #3   Title Pt will ambulate >500 ft over level/unlevel surfaces independently with no AD    Baseline compensations    Status On-going    Target Date 09/29/20      PT LONG TERM GOAL #4   Status On-going    Target Date 09/29/20      PT LONG TERM GOAL #5   Status On-going    Target Date 09/29/20                 Plan - 09/16/20 1340    Clinical Impression Statement Pt tolerated session well today with focus on ROM and strengthening.  Will continue to benefit from PT to maximize function.    Examination-Activity Limitations Locomotion Level;Stand    Examination-Participation Restrictions Community Activity;Interpersonal Relationship    Stability/Clinical Decision Making Stable/Uncomplicated    Rehab Potential Good    PT Frequency 3x / week    PT Duration 4 weeks    PT Treatment/Interventions ADLs/Self Care Home  Management;Cryotherapy;Electrical Stimulation;Gait training;Stair training;Functional mobility training;Therapeutic activities;Therapeutic exercise;Balance training;Neuromuscular re-education;Manual techniques;Patient/family education;Scar mobilization;Passive range of motion;Vasopneumatic Device    PT Next Visit Plan measure, continue with aggressive ROM    PT Home Exercise Plan Tailgate knee flexion, self overpressure into extension and stationary bike    Consulted and Agree with Plan of Care Patient           Patient will benefit from skilled therapeutic intervention in order to improve the following deficits and impairments:  Abnormal gait, Decreased range of motion, Difficulty walking, Decreased endurance, Pain, Impaired flexibility, Increased edema  Visit Diagnosis: Difficulty walking  Stiffness of right knee, not elsewhere classified  Localized edema  Chronic pain of right knee  Muscle weakness (generalized)  Difficulty in walking, not elsewhere classified  Acute pain of right knee     Problem List Patient Active Problem List   Diagnosis Date Noted   Arthrofibrosis of total knee replacement ([place]) 08/26/2020   Status post total right knee replacement 07/17/2020   Unilateral primary osteoarthritis, right knee 12/30/2019   Acute right-sided low back pain with right-sided sciatica 12/25/2017   Health maintenance examination 09/30/2013   Hyperlipidemia 09/30/2013      Laureen Abrahams, PT, DPT 09/16/20 1:42 PM    [facility] [address] [place], [place], [address] Phone: [phone]   Fax:  [phone]  Name: Elli Groesbeck MRN: [id number] Date of Birth: [birth year]

## 2020-09-21 ENCOUNTER — Other Ambulatory Visit: Payer: Self-pay

## 2020-09-21 ENCOUNTER — Encounter: Payer: Self-pay | Admitting: Physical Therapy

## 2020-09-21 ENCOUNTER — Ambulatory Visit: Payer: Medicare Other | Admitting: Physical Therapy

## 2020-09-21 DIAGNOSIS — M25561 Pain in right knee: Secondary | ICD-10-CM

## 2020-09-21 DIAGNOSIS — M25661 Stiffness of right knee, not elsewhere classified: Secondary | ICD-10-CM | POA: Diagnosis not present

## 2020-09-21 DIAGNOSIS — R600 Localized edema: Secondary | ICD-10-CM | POA: Diagnosis not present

## 2020-09-21 DIAGNOSIS — R262 Difficulty in walking, not elsewhere classified: Secondary | ICD-10-CM

## 2020-09-21 DIAGNOSIS — G8929 Other chronic pain: Secondary | ICD-10-CM

## 2020-09-21 DIAGNOSIS — M6281 Muscle weakness (generalized): Secondary | ICD-10-CM

## 2020-09-21 NOTE — Therapy (Signed)
[facility] Physical Therapy [address] [place], [place], [address] Phone: [phone]   Fax:  [phone]  Physical Therapy Treatment  Patient Details  Name: Nancy Eaton MRN: [id number] Date of Birth: 07/25/1956 Referring Provider (PT): Ninfa Linden   Encounter Date: 09/21/2020   PT End of Session - 09/21/20 0842    Visit Number 15    Date for PT Re-Evaluation 10/04/20    Progress Note Due on Visit 20    PT Start Time 0800    PT Stop Time 0841    PT Time Calculation (min) 41 min    Activity Tolerance Patient tolerated treatment well;Patient limited by pain    Behavior During Therapy [facility] for tasks assessed/performed           Past Medical History:  Diagnosis Date  . Arthritis    Knee  . Hyperlipidemia    Lovastatin started approx 2011  . IFG (impaired fasting glucose) 2018   HbA1c 5.9% (stable)  . PONV (postoperative nausea and vomiting)   . Pre-diabetes   . Right knee pain 12/2019   End stage: TKA recommended by ortho 01/2020->pt considering  . Sigmoid diverticulosis 04/2010   "     "     "    "    Past Surgical History:  Procedure Laterality Date  . BREAST ENHANCEMENT SURGERY     As of mammogram 12/2017--rupture of implants stable.  Marland Kitchen CARPAL TUNNEL RELEASE    . CESAREAN SECTION    . COLONOSCOPY  age 43   [facility]--recall 10 yrs  . TOTAL KNEE ARTHROPLASTY Right 07/17/2020   Procedure: RIGHT TOTAL KNEE ARTHROPLASTY;  Surgeon: Mcarthur Rossetti, MD;  Location: [facility];  Service: Orthopedics;  Laterality: Right;    There were no vitals filed for this visit.   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 No/denies    Pain Onset More than a month ago                             [facility] Adult PT Treatment/Exercise - 09/21/20 0803      Knee/Hip  Exercises: Stretches   Passive Hamstring Stretch Right;5 reps;30 seconds    Passive Hamstring Stretch Limitations standing; Rt foot on 6" step    Knee: Self-Stretch Limitations LLE providing overpressure: 10 sec X 15 sec    Gastroc Stretch Both;3 reps;30 seconds    Gastroc Stretch Limitations ovv aerobic step    Other Knee/Hip Stretches Rt knee flexion on 6" step 5x30 sec      Knee/Hip Exercises: Aerobic   Nustep L5 x 10 min      Knee/Hip Exercises: Machines for Strengthening   Cybex Knee Extension 5# 2x10 RLE    Cybex Knee Flexion 10# 2x10 RLE only - cues for full range      Manual Therapy   Passive ROM Rt knee PROM flexion and extension, flexion mobs, MET contract and relax stretching                    PT Short Term Goals - 08/07/20 0843      PT SHORT TERM GOAL #1   Title Pt will be independent with HEP    Status Achieved  PT Long Term Goals - 09/07/20 1618      PT LONG TERM GOAL #1   Title Pt will demo knee flexion to 110    Baseline 90    Status On-going    Target Date 09/29/20      PT LONG TERM GOAL #2   Title Pt will demo knee extension to </= 3 deg    Status On-going    Target Date 09/29/20      PT LONG TERM GOAL #3   Title Pt will ambulate >500 ft over level/unlevel surfaces independently with no AD    Baseline compensations    Status On-going    Target Date 09/29/20      PT LONG TERM GOAL #4   Status On-going    Target Date 09/29/20      PT LONG TERM GOAL #5   Status On-going    Target Date 09/29/20                 Plan - 09/21/20 0842    Clinical Impression Statement Pt cont to demonstrate steady progress with flexion with decreased pain.  Will continue to benefit from PT to maximize function.    Examination-Activity Limitations Locomotion Level;Stand    Examination-Participation Restrictions Community Activity;Interpersonal Relationship    Stability/Clinical Decision Making Stable/Uncomplicated    Rehab Potential  Good    PT Frequency 3x / week    PT Duration 4 weeks    PT Treatment/Interventions ADLs/Self Care Home Management;Cryotherapy;Electrical Stimulation;Gait training;Stair training;Functional mobility training;Therapeutic activities;Therapeutic exercise;Balance training;Neuromuscular re-education;Manual techniques;Patient/family education;Scar mobilization;Passive range of motion;Vasopneumatic Device    PT Next Visit Plan measure, continue with aggressive ROM    PT Home Exercise Plan Tailgate knee flexion, self overpressure into extension and stationary bike    Consulted and Agree with Plan of Care Patient           Patient will benefit from skilled therapeutic intervention in order to improve the following deficits and impairments:  Abnormal gait, Decreased range of motion, Difficulty walking, Decreased endurance, Pain, Impaired flexibility, Increased edema  Visit Diagnosis: Difficulty walking  Stiffness of right knee, not elsewhere classified  Localized edema  Chronic pain of right knee  Muscle weakness (generalized)  Difficulty in walking, not elsewhere classified  Acute pain of right knee     Problem List Patient Active Problem List   Diagnosis Date Noted  . 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 Status post total right knee replacement 07/17/2020  . Unilateral primary osteoarthritis, right knee 12/30/2019  . Acute right-sided low back pain with right-sided sciatica 12/25/2017  . Health maintenance examination 09/30/2013  . Hyperlipidemia 09/30/2013      Laureen Abrahams, PT, DPT 09/21/20 8:44 AM    [facility] Physical Therapy [address] [place], [place], [address] Phone: [phone]   Fax:  [phone]  Name: Nancy Eaton MRN: [id number] Date of Birth: 1956/07/08

## 2020-09-23 ENCOUNTER — Other Ambulatory Visit: Payer: Self-pay

## 2020-09-23 ENCOUNTER — Encounter: Payer: Self-pay | Admitting: Physical Therapy

## 2020-09-23 ENCOUNTER — Ambulatory Visit: Payer: Medicare Other | Admitting: Physical Therapy

## 2020-09-23 DIAGNOSIS — R600 Localized edema: Secondary | ICD-10-CM | POA: Diagnosis not present

## 2020-09-23 DIAGNOSIS — M6281 Muscle weakness (generalized): Secondary | ICD-10-CM

## 2020-09-23 DIAGNOSIS — R262 Difficulty in walking, not elsewhere classified: Secondary | ICD-10-CM | POA: Diagnosis not present

## 2020-09-23 DIAGNOSIS — M25661 Stiffness of right knee, not elsewhere classified: Secondary | ICD-10-CM

## 2020-09-23 DIAGNOSIS — M25561 Pain in right knee: Secondary | ICD-10-CM

## 2020-09-23 DIAGNOSIS — G8929 Other chronic pain: Secondary | ICD-10-CM

## 2020-09-23 NOTE — Patient Instructions (Signed)
Access Code: [email or web address] URL: [email or web address] Date: 09/23/2020 Prepared by: [name]  Exercises Seated Knee Extension Stretch with Chair - 1 x daily - 7 x weekly - 3 sets - 10 reps - 3 sec hold Seated Knee Flexion Stretch - 1 x daily - 7 x weekly - 3 sets - 3 reps - 20 sec hold Seated Knee Flexion AAROM - 1 x daily - 7 x weekly - 3 sets - 10 reps - 5 seconds hold Seated Passive Knee Extension - 1 x daily - 7 x weekly - 3 sets - 10 reps - 10 seconds hold Forward Step Up with Counter Support - 1 x daily - 7 x weekly - 3 sets - 10 reps - 5 seconds hold Forward Step Down with Counter Support at Side - 1 x daily - 7 x weekly - 3 sets - 10 reps - 5 seconds hold Standing Bilateral Gastroc Stretch with Step - 3 x daily - 7 x weekly - 1 sets - 3 reps - 20 seconds hold Standing Knee Flexion Stretch on Step - 3 x daily - 7 x weekly - 1 sets - 3 reps - 20 seconds hold Supine Quadriceps Stretch with Strap on Table - 3 x daily - 7 x weekly - 1 sets - 13 reps - 20 seconds hold

## 2020-09-23 NOTE — Therapy (Signed)
[facility] Physical Therapy [address] [place], [place], [address] Phone: [phone]   Fax:  [phone]  Physical Therapy Treatment  Patient Details  Name: Nancy Eaton MRN: [id number] Date of Birth: [birth year] Referring Provider (PT): Magnus Ivan   Encounter Date: 09/23/2020   PT End of Session - 09/23/20 0759    Visit Number 16    Date for PT Re-Evaluation 10/04/20    Progress Note Due on Visit 20    PT Start Time 0800    PT Stop Time 0855    PT Time Calculation (min) 55 min    Activity Tolerance Patient tolerated treatment well;Patient limited by pain    Behavior During Therapy [facility] for tasks assessed/performed           Past Medical History:  Diagnosis Date  . Arthritis    Knee  . Hyperlipidemia    Lovastatin started approx 2011  . IFG (impaired fasting glucose) 2018   HbA1c 5.9% (stable)  . PONV (postoperative nausea and vomiting)   . Pre-diabetes   . Right knee pain 12/2019   End stage: TKA recommended by ortho 01/2020->pt considering  . Sigmoid diverticulosis 04/2010   "     "     "    "    Past Surgical History:  Procedure Laterality Date  . BREAST ENHANCEMENT SURGERY     As of mammogram 12/2017--rupture of implants stable.  Marland Kitchen CARPAL TUNNEL RELEASE    . CESAREAN SECTION    . COLONOSCOPY  age 38   [facility]--recall 10 yrs  . TOTAL KNEE ARTHROPLASTY Right 07/17/2020   Procedure: RIGHT TOTAL KNEE ARTHROPLASTY;  Surgeon: Kathryne Hitch, MD;  Location: [facility];  Service: Orthopedics;  Laterality: Right;    There were no vitals filed for this visit.   Subjective Assessment - 09/23/20 0800    Subjective She had trouble sleeping last night.  Her exercises are going well.    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 No/denies    Pain Onset More than a month ago              [facility] PT Assessment - 09/23/20 0001      PROM   Right  Knee Extension -3    Right Knee Flexion 93   sitting                        [facility] Adult PT Treatment/Exercise - 09/23/20 0800      Self-Care   Self-Care ADL's    ADL's PT instructed in sleeping sidelying with pillow between LEs and using pillows to tent sheets off feet .       Knee/Hip Exercises: Stretches   Passive Hamstring Stretch Right;5 reps;30 seconds    Passive Hamstring Stretch Limitations standing; Rt foot on 6" step    Quad Stretch Right;3 reps;20 seconds    Quad Stretch Limitations supine with RLE over edge & strap to assist.     Knee: Self-Stretch Limitations LLE providing overpressure: 10 sec X 15 sec    Gastroc Stretch Both;3 reps;30 seconds    Gastroc Stretch Limitations off aerobic step    Other Knee/Hip Stretches seated heel slides with weight shift to right 20 sec hold 5 reps      Knee/Hip Exercises: Aerobic   Stationary Bike Seat 8 for 10 minutes AROM (back and forth  with 5 second hold at flexed position)      Knee/Hip Exercises: Machines for Strengthening   Cybex Knee Extension 10# 2x10 RLE    Cybex Knee Flexion 20# 2x10 RLE only - cues for full range    Total Gym Leg Press --      Knee/Hip Exercises: Standing   Forward Step Up Right;10 reps;Hand Hold: 1;Step Height: 6"    Forward Step Up Limitations cues on proper technique including LLE PF     Step Down Right;1 set;10 reps;Hand Hold: 1;Step Height: 6"    Step Down Limitations cues on proper technique including initial contact with LLE PF then sitting heel to floor with RLE still on step flexing.       Knee/Hip Exercises: Seated   Sit to Sand 15 reps;with UE support   toes against wall to limit extending RLE & goal sit at edge     Vasopneumatic   Number Minutes Vasopneumatic  10 minutes    Vasopnuematic Location  Knee    Vasopneumatic Pressure Medium    Vasopneumatic Temperature  34      Manual Therapy   Passive ROM --                  PT Education - 09/23/20 0851     Education Details updated HEP  Medbridge Access Access Code: [email or web address]    Person(s) Educated Patient    Methods Explanation;Demonstration;Tactile cues;Verbal cues;Handout    Comprehension Verbalized understanding;Returned demonstration            PT Short Term Goals - 08/07/20 0843      PT SHORT TERM GOAL #1   Title Pt will be independent with HEP    Status Achieved             PT Long Term Goals - 09/07/20 1618      PT LONG TERM GOAL #1   Title Pt will demo knee flexion to 110    Baseline 90    Status On-going    Target Date 09/29/20      PT LONG TERM GOAL #2   Title Pt will demo knee extension to </= 3 deg    Status On-going    Target Date 09/29/20      PT LONG TERM GOAL #3   Title Pt will ambulate >500 ft over level/unlevel surfaces independently with no AD    Baseline compensations    Status On-going    Target Date 09/29/20      PT LONG TERM GOAL #4   Status On-going    Target Date 09/29/20      PT LONG TERM GOAL #5   Status On-going    Target Date 09/29/20                 Plan - 09/23/20 0759    Clinical Impression Statement PT updated HEP and she appears to understand program.  PT instructed in proper technique for step ups & downs with cues to incorporate into curbs & stairs.    Examination-Activity Limitations Locomotion Level;Stand    Examination-Participation Restrictions Community Activity;Interpersonal Relationship    Stability/Clinical Decision Making Stable/Uncomplicated    Rehab Potential Good    PT Frequency 3x / week    PT Duration 4 weeks    PT Treatment/Interventions ADLs/Self Care Home Management;Cryotherapy;Electrical Stimulation;Gait training;Stair training;Functional mobility training;Therapeutic activities;Therapeutic exercise;Balance training;Neuromuscular re-education;Manual techniques;Patient/family education;Scar mobilization;Passive range of motion;Vasopneumatic Device    PT Next Visit Plan measure, continue with aggressive  ROM &  therapuetic exercise    PT Home Exercise Plan Tailgate knee flexion, self overpressure into extension and stationary bike,   Medbridge Access [email or web address]    Consulted and Agree with Plan of Care Patient           Patient will benefit from skilled therapeutic intervention in order to improve the following deficits and impairments:  Abnormal gait, Decreased range of motion, Difficulty walking, Decreased endurance, Pain, Impaired flexibility, Increased edema  Visit Diagnosis: Stiffness of right knee, not elsewhere classified  Difficulty walking  Localized edema  Chronic pain of right knee  Muscle weakness (generalized)  Acute pain of right knee     Problem List Patient Active Problem List   Diagnosis Date Noted  . Arthrofibrosis of total knee replacement ([facility]) 08/26/2020  . Status post total right knee replacement 07/17/2020  . Unilateral primary osteoarthritis, right knee 12/30/2019  . Acute right-sided low back pain with right-sided sciatica 12/25/2017  . Health maintenance examination 09/30/2013  . Hyperlipidemia 09/30/2013    [name], PT, DPT 09/23/2020, 9:02 AM  [facility] Physical Therapy [address] [place], [place], [address] Phone: [phone]   Fax:  [phone]  Name: Nancy Eaton MRN: [id number] Date of Birth: 1956-04-14

## 2020-09-28 ENCOUNTER — Encounter: Payer: Self-pay | Admitting: Physical Therapy

## 2020-09-28 ENCOUNTER — Ambulatory Visit (INDEPENDENT_AMBULATORY_CARE_PROVIDER_SITE_OTHER): Payer: Medicare Other | Admitting: Physical Therapy

## 2020-09-28 ENCOUNTER — Other Ambulatory Visit: Payer: Self-pay

## 2020-09-28 DIAGNOSIS — M6281 Muscle weakness (generalized): Secondary | ICD-10-CM

## 2020-09-28 DIAGNOSIS — M25661 Stiffness of right knee, not elsewhere classified: Secondary | ICD-10-CM

## 2020-09-28 DIAGNOSIS — R600 Localized edema: Secondary | ICD-10-CM | POA: Diagnosis not present

## 2020-09-28 DIAGNOSIS — R262 Difficulty in walking, not elsewhere classified: Secondary | ICD-10-CM | POA: Diagnosis not present

## 2020-09-28 DIAGNOSIS — G8929 Other chronic pain: Secondary | ICD-10-CM

## 2020-09-28 DIAGNOSIS — M25561 Pain in right knee: Secondary | ICD-10-CM | POA: Diagnosis not present

## 2020-09-28 NOTE — Therapy (Signed)
[facility] Physical Therapy [address] [place], [place], [address] Phone: [phone]   Fax:  [phone]  Physical Therapy Treatment  Patient Details  Name: Nancy Eaton MRN: [id number] Date of Birth: 31-Oct-1956 Referring Provider (PT): Magnus Ivan   Encounter Date: 09/28/2020   PT End of Session - 09/28/20 0806    Visit Number 17    Date for PT Re-Evaluation 10/04/20    Progress Note Due on Visit 20    PT Start Time 0800    PT Stop Time 0855    PT Time Calculation (min) 55 min    Activity Tolerance Patient tolerated treatment well;Patient limited by pain    Behavior During Therapy [facility] for tasks assessed/performed           Past Medical History:  Diagnosis Date  . Arthritis    Knee  . Hyperlipidemia    Lovastatin started approx 2011  . IFG (impaired fasting glucose) 2018   HbA1c 5.9% (stable)  . PONV (postoperative nausea and vomiting)   . Pre-diabetes   . Right knee pain 12/2019   End stage: TKA recommended by ortho 01/2020->pt considering  . Sigmoid diverticulosis 04/2010   "     "     "    "    Past Surgical History:  Procedure Laterality Date  . BREAST ENHANCEMENT SURGERY     As of mammogram 12/2017--rupture of implants stable.  Marland Kitchen CARPAL TUNNEL RELEASE    . CESAREAN SECTION    . COLONOSCOPY  age 76   [facility]--recall 10 yrs  . TOTAL KNEE ARTHROPLASTY Right 07/17/2020   Procedure: RIGHT TOTAL KNEE ARTHROPLASTY;  Surgeon: Kathryne Hitch, MD;  Location: [facility];  Service: Orthopedics;  Laterality: Right;    There were no vitals filed for this visit.   Subjective Assessment - 09/28/20 0800    Subjective She went to gym and got the bike to go completely around.  The pillows in bed have helped a lot.    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 No/denies    Pain Onset More than a month ago              [facility] PT Assessment - 09/28/20  0800      PROM   Right Knee Extension -3   seated    Right Knee Flexion 95   seated                        [facility] Adult PT Treatment/Exercise - 09/28/20 0800      Self-Care   Self-Care --    ADL's --      Knee/Hip Exercises: Stretches   Passive Hamstring Stretch Right;5 reps;30 seconds    Passive Hamstring Stretch Limitations standing; Rt foot on 6" step    Quad Stretch Right;3 reps;20 seconds    Quad Stretch Limitations supine with RLE over edge & strap to assist.     Knee: Self-Stretch Limitations --    Gastroc Stretch Both;3 reps;30 seconds    Gastroc Stretch Limitations off aerobic step    Other Knee/Hip Stretches --      Knee/Hip Exercises: Aerobic   Stationary Bike Seat 8 for 10 minutes AROM (back and forth with 5 second hold at flexed position)      Knee/Hip Exercises: Machines for Strengthening   Cybex Knee Extension 10# 2x10 RLE  AAROM for last 5* ext    Cybex Knee Flexion 25# 2x10 RLE only - cues for full range AAROM last 10*    Cybex Leg Press Shuttle leg press 100# BLE 5 sec hold max flex & 5 sec hold ext lifting LLE off plate 15 reps 2 sets      Knee/Hip Exercises: Standing   Forward Step Up Right;10 reps;Hand Hold: 1;Step Height: 6"    Forward Step Up Limitations cues on proper technique including LLE PF     Step Down Right;1 set;10 reps;Hand Hold: 1;Step Height: 6"    Step Down Limitations cues on proper technique including initial contact with LLE PF then sitting heel to floor with RLE still on step flexing.       Knee/Hip Exercises: Seated   Sit to Sand --      Vasopneumatic   Number Minutes Vasopneumatic  10 minutes    Vasopnuematic Location  Knee    Vasopneumatic Pressure Medium    Vasopneumatic Temperature  34                    PT Short Term Goals - 08/07/20 0843      PT SHORT TERM GOAL #1   Title Pt will be independent with HEP    Status Achieved             PT Long Term Goals - 09/07/20 1618      PT LONG TERM  GOAL #1   Title Pt will demo knee flexion to 110    Baseline 90    Status On-going    Target Date 09/29/20      PT LONG TERM GOAL #2   Title Pt will demo knee extension to </= 3 deg    Status On-going    Target Date 09/29/20      PT LONG TERM GOAL #3   Title Pt will ambulate >500 ft over level/unlevel surfaces independently with no AD    Baseline compensations    Status On-going    Target Date 09/29/20      PT LONG TERM GOAL #4   Status On-going    Target Date 09/29/20      PT LONG TERM GOAL #5   Status On-going    Target Date 09/29/20                 Plan - 09/28/20 0807    Clinical Impression Statement She has made progress but not to level of goals which she still has potential to meet.  PT anticipates extending plan of care next visit.  PT session included exercises for functional strength in increased ranges.    Examination-Activity Limitations Locomotion Level;Stand    Examination-Participation Restrictions Community Activity;Interpersonal Relationship    Stability/Clinical Decision Making Stable/Uncomplicated    Rehab Potential Good    PT Frequency 3x / week    PT Duration 4 weeks    PT Treatment/Interventions ADLs/Self Care Home Management;Cryotherapy;Electrical Stimulation;Gait training;Stair training;Functional mobility training;Therapeutic activities;Therapeutic exercise;Balance training;Neuromuscular re-education;Manual techniques;Patient/family education;Scar mobilization;Passive range of motion;Vasopneumatic Device    PT Next Visit Plan Do recert.  measure, continue with aggressive ROM & therapuetic exercise    PT Home Exercise Plan Tailgate knee flexion, self overpressure into extension and stationary bike,   Medbridge Access [email or web address]    Consulted and Agree with Plan of Care Patient           Patient will benefit from skilled therapeutic intervention in order to improve the following deficits  and impairments:  Abnormal gait, Decreased range of motion,  Difficulty walking, Decreased endurance, Pain, Impaired flexibility, Increased edema  Visit Diagnosis: Stiffness of right knee, not elsewhere classified  Difficulty walking  Localized edema  Chronic pain of right knee  Muscle weakness (generalized)  Acute pain of right knee     Problem List Patient Active Problem List   Diagnosis Date Noted  . Arthrofibrosis of total knee replacement ([facility]) 08/26/2020  . Status post total right knee replacement 07/17/2020  . Unilateral primary osteoarthritis, right knee 12/30/2019  . Acute right-sided low back pain with right-sided sciatica 12/25/2017  . Health maintenance examination 09/30/2013  . Hyperlipidemia 09/30/2013    [name], PT, DPT 09/28/2020, 9:03 AM  [facility] Physical Therapy [address] [place], [place], [address] Phone: [phone]   Fax:  [phone]  Name: Nancy Eaton MRN: [id number] Date of Birth: Mar 20, 1956

## 2020-09-30 ENCOUNTER — Ambulatory Visit: Payer: Medicare Other | Admitting: Physical Therapy

## 2020-09-30 ENCOUNTER — Encounter: Payer: Self-pay | Admitting: Physical Therapy

## 2020-09-30 ENCOUNTER — Other Ambulatory Visit: Payer: Self-pay

## 2020-09-30 DIAGNOSIS — M25561 Pain in right knee: Secondary | ICD-10-CM

## 2020-09-30 DIAGNOSIS — R262 Difficulty in walking, not elsewhere classified: Secondary | ICD-10-CM

## 2020-09-30 DIAGNOSIS — M25661 Stiffness of right knee, not elsewhere classified: Secondary | ICD-10-CM

## 2020-09-30 DIAGNOSIS — R600 Localized edema: Secondary | ICD-10-CM

## 2020-09-30 DIAGNOSIS — M6281 Muscle weakness (generalized): Secondary | ICD-10-CM

## 2020-09-30 DIAGNOSIS — G8929 Other chronic pain: Secondary | ICD-10-CM

## 2020-09-30 NOTE — Therapy (Signed)
[facility] [address] [place], [place], [address] Phone: [phone]   Fax:  [phone]  Physical Therapy Treatment, Recertification & 10th Visit Progress Note   Patient Details  Name: Nancy Eaton MRN: [id number] Date of Birth: 06/01/1956 Referring Provider (PT): Blackman   Encounter Date: 09/30/2020   Progress Note Reporting Period 09/09/2020 to 09/30/2020  See note below for Objective Data and Assessment of Progress/Goals.        PT End of Session - 09/30/20 0803    Visit Number 18    Number of Visits 26    Date for PT Re-Evaluation 10/30/20    Progress Note Due on Visit 28    PT Start Time 0757    PT Stop Time 0850    PT Time Calculation (min) 53 min    Activity Tolerance Patient tolerated treatment well;Patient limited by pain    Behavior During Therapy WFL for tasks assessed/performed           Past Medical History:  Diagnosis Date  . Arthritis    Knee  . Hyperlipidemia    Lovastatin started approx 2011  . IFG (impaired fasting glucose) 2018   HbA1c 5.9% (stable)  . PONV (postoperative nausea and vomiting)   . Pre-diabetes   . Right knee pain 12/2019   End stage: TKA recommended by ortho 01/2020->pt considering  . Sigmoid diverticulosis 04/2010   "     "     "    "    Past Surgical History:  Procedure Laterality Date  . BREAST ENHANCEMENT SURGERY     As of mammogram 12/2017--rupture of implants stable.  . CARPAL TUNNEL RELEASE    . CESAREAN SECTION    . COLONOSCOPY  age 48   [facility]--recall 10 yrs  . TOTAL KNEE ARTHROPLASTY Right 07/17/2020   Procedure: RIGHT TOTAL KNEE ARTHROPLASTY;  Surgeon: Blackman, Christopher Y, MD;  Location: [facility];  Service: Orthopedics;  Laterality: Right;    There were no vitals filed for this visit.   Subjective Assessment - 09/30/20 0800    Subjective No new issues.  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 No/denies    Pain Onset More than a month ago              [facility] PT Assessment - 09/30/20 0001      Assessment   Medical Diagnosis R TKA    Referring Provider (PT) Blackman    Onset Date/Surgical Date 07/17/20      PROM   Right Knee Extension -1    Right Knee Flexion 77                         [facility] Adult PT Treatment/Exercise - 09/30/20 0757      Knee/Hip Exercises: Stretches   Passive Hamstring Stretch Right;5 reps;30 seconds    Passive Hamstring Stretch Limitations standing; Rt foot on 6" step    Quad Stretch Right;3 reps;20 seconds    Quad Stretch Limitations supine with RLE over edge & strap to assist.     Gastroc Stretch Both;30 seconds;2 reps    Gastroc Stretch Limitations off aerobic step      Knee/Hip Exercises: Aerobic   Stationary Bike Seat 8 for 8 minutes AROM        Knee/Hip Exercises: Machines for Strengthening   Cybex Knee Extension 10# 2x10 RLE AAROM for last 5* ext    Cybex Knee Flexion --    Cybex Leg Press Shuttle leg press 100# BLE 5 sec hold max flex & 5 sec hold ext lifting LLE off plate 15 reps 2 sets      Knee/Hip Exercises: Standing   Knee Flexion Strengthening;AAROM;Right;2 sets;10 reps   3#   Knee Flexion Limitations standing LLE on step for clearance, AROM ~60* then AAROM with strap    Forward Step Up Right;10 reps;Hand Hold: 1;Step Height: 6"    Forward Step Up Limitations cues on proper technique including LLE PF     Step Down Right;1 set;10 reps;Hand Hold: 1;Step Height: 6"    Step Down Limitations cues on proper technique including initial contact with LLE PF then sitting heel to floor with RLE still on step flexing.       Knee/Hip Exercises: Prone   Prone Knee Hang 3 minutes;Weights    Prone Knee Hang Weights (lbs) 3    Prone Knee Hang Limitations PT instructed as HEP      Vasopneumatic   Number Minutes  Vasopneumatic  10 minutes    Vasopnuematic Location  Knee   Intermittent TKE positioning for stretch   Vasopneumatic Pressure Medium    Vasopneumatic Temperature  34                    PT Short Term Goals - 08/07/20 0843      PT SHORT TERM GOAL #1   Title Pt will be independent with HEP    Status Achieved             PT Long Term Goals - 09/30/20 1612      PT LONG TERM GOAL #1   Title Pt will demo knee flexion to 110    Baseline 90, not met 09/30/2020    Status Not Met      PT LONG TERM GOAL #2   Title Pt will demo knee extension to </= 3 deg    Baseline PROM MET 09/30/2020    Status Achieved      PT LONG TERM GOAL #3   Title Pt will ambulate >500 ft over level/unlevel surfaces independently with no AD    Baseline MET 09/30/2020    Status Achieved      PT LONG TERM GOAL #4   Status On-going      PT LONG TERM GOAL #5   Status On-going              PT Long Term Goals - 09/30/20 1617      PT LONG TERM GOAL #1   Title Left Knee PROM ext -1* to flex 100*    Time 4    Period Weeks    Status Revised    Target Date 10/30/20      PT LONG TERM GOAL #2   Title left Knee AROM seated extension -5*    Time 4    Period Weeks    Status Revised    Target Date 10/30/20      PT LONG TERM GOAL #3   Title AROM knee flexion standing 75*    Time 4    Period Weeks    Status Revised    Target Date 10/30/20      PT LONG TERM GOAL #4   Title Patient able to ascend & descend ramps & curbs without device and stairs  single rail alternating pattern modified independent.    Time 4    Period Weeks    Status Revised    Target Date 10/30/20      PT LONG TERM GOAL #5   Status On-going                Plan - 09/30/20 0803    Clinical Impression Statement PT added standing knee flexion with strap to increase range.  Patient improved control for steps and may be able to perform without UE support next session.  Patient would benefit from additional skilled PT  to increase PROM, AROM, strength & function.    Examination-Activity Limitations Locomotion Level;Stand    Examination-Participation Restrictions Community Activity;Interpersonal Relationship    Stability/Clinical Decision Making Stable/Uncomplicated    Rehab Potential Good    PT Frequency 2x / week    PT Duration 4 weeks    PT Treatment/Interventions ADLs/Self Care Home Management;Cryotherapy;Electrical Stimulation;Gait training;Stair training;Functional mobility training;Therapeutic activities;Therapeutic exercise;Balance training;Neuromuscular re-education;Manual techniques;Patient/family education;Scar mobilization;Passive range of motion;Vasopneumatic Device    PT Next Visit Plan measure, continue with aggressive ROM & therapuetic exercise    PT Home Exercise Plan Tailgate knee flexion, self overpressure into extension and stationary bike,   Medbridge Access [email or web address]    Consulted and Agree with Plan of Care Patient           Patient will benefit from skilled therapeutic intervention in order to improve the following deficits and impairments:  Abnormal gait, Decreased range of motion, Difficulty walking, Decreased endurance, Pain, Impaired flexibility, Increased edema  Visit Diagnosis: Stiffness of right knee, not elsewhere classified  Difficulty walking  Localized edema  Chronic pain of right knee  Muscle weakness (generalized)  Acute pain of right knee     Problem List Patient Active Problem List   Diagnosis Date Noted  . Arthrofibrosis of total knee replacement (HCC) 08/26/2020  . Status post total right knee replacement 07/17/2020  . Unilateral primary osteoarthritis, right knee 12/30/2019  . Acute right-sided low back pain with right-sided sciatica 12/25/2017  . Health maintenance examination 09/30/2013  . Hyperlipidemia 09/30/2013    Robin Waldron, PT, DPT 09/30/2020, 4:16 PM  [facility] [address] [place], [place],  [address] Phone: [phone]   Fax:  [phone]  Name: Irelynd Woods Wohlers MRN: [id number] Date of Birth: 05/23/1956   

## 2020-10-05 ENCOUNTER — Encounter: Payer: Self-pay | Admitting: Physical Therapy

## 2020-10-05 ENCOUNTER — Other Ambulatory Visit: Payer: Self-pay

## 2020-10-05 ENCOUNTER — Ambulatory Visit: Payer: Medicare Other | Admitting: Physical Therapy

## 2020-10-05 DIAGNOSIS — R600 Localized edema: Secondary | ICD-10-CM | POA: Diagnosis not present

## 2020-10-05 DIAGNOSIS — M25561 Pain in right knee: Secondary | ICD-10-CM

## 2020-10-05 DIAGNOSIS — R262 Difficulty in walking, not elsewhere classified: Secondary | ICD-10-CM | POA: Diagnosis not present

## 2020-10-05 DIAGNOSIS — M6281 Muscle weakness (generalized): Secondary | ICD-10-CM

## 2020-10-05 DIAGNOSIS — M25661 Stiffness of right knee, not elsewhere classified: Secondary | ICD-10-CM | POA: Diagnosis not present

## 2020-10-05 DIAGNOSIS — G8929 Other chronic pain: Secondary | ICD-10-CM

## 2020-10-05 NOTE — Therapy (Signed)
[facility] [address] [place], [place], [address] Phone: [phone]   Fax:  [phone]  Physical Therapy Treatment  Patient Details  Name: Nancy Eaton MRN: [id number] Date of Birth: [birth year] Referring Provider (PT): Magnus Ivan   Encounter Date: 10/05/2020   PT End of Session - 10/05/20 0804    Visit Number 19    Number of Visits 26    Date for PT Re-Evaluation 10/30/20    Progress Note Due on Visit 28    PT Start Time 0800    PT Stop Time 0855    PT Time Calculation (min) 55 min    Activity Tolerance Patient tolerated treatment well;Patient limited by pain    Behavior During Therapy [facility] for tasks assessed/performed           Past Medical History:  Diagnosis Date  . Arthritis    Knee  . Hyperlipidemia    Lovastatin started approx 2011  . IFG (impaired fasting glucose) 2018   HbA1c 5.9% (stable)  . PONV (postoperative nausea and vomiting)   . Pre-diabetes   . Right knee pain 12/2019   End stage: TKA recommended by ortho 01/2020->pt considering  . Sigmoid diverticulosis 04/2010   "     "     "    "    Past Surgical History:  Procedure Laterality Date  . BREAST ENHANCEMENT SURGERY     As of mammogram 12/2017--rupture of implants stable.  Marland Kitchen CARPAL TUNNEL RELEASE    . CESAREAN SECTION    . COLONOSCOPY  age 64   [facility]--recall 10 yrs  . TOTAL KNEE ARTHROPLASTY Right 07/17/2020   Procedure: RIGHT TOTAL KNEE ARTHROPLASTY;  Surgeon: Kathryne Hitch, MD;  Location: [facility];  Service: Orthopedics;  Laterality: Right;    There were no vitals filed for this visit.   Subjective Assessment - 10/05/20 0800    Subjective She worked all weekend so limited exercises.    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 Yes    Pain Score 3     Pain Location Knee    Pain Orientation Right    Pain Descriptors / Indicators Aching;Sore     Pain Type Acute pain;Surgical pain    Pain Onset More than a month ago    Pain Frequency Intermittent    Aggravating Factors  increased walking    Pain Relieving Factors tylenol                             [facility] Adult PT Treatment/Exercise - 10/05/20 0800      Self-Care   Self-Care ADL's    ADL's PT demo & verbal cues on quadraped & tall kneeling per pt request.  Pt return quadraped on mat table (soft surface) initially with feet over edge (less knee flexion required) and progressed to feet on mat table.  She also progressed to posterior wt shift to stretch knee flexion.        Knee/Hip Exercises: Stretches   Passive Hamstring Stretch Right;5 reps;30 seconds    Passive Hamstring Stretch Limitations standing; Rt foot on 6" step    Quad Stretch Right;3 reps;20 seconds    Quad Stretch Limitations supine with RLE over edge & strap to assist.     Knee: Self-Stretch to increase Flexion Right;3 reps;20 seconds    Knee: Self-Stretch  Limitations lunge stretch foot in chair    Gastroc Stretch Both;30 seconds;2 reps    Gastroc Stretch Limitations off aerobic step      Knee/Hip Exercises: Aerobic   Stationary Bike Seat 8 for 8 minutes AROM      Knee/Hip Exercises: Machines for Strengthening   Cybex Knee Extension 10# 2x10    Cybex Leg Press Shuttle leg press 106# BLE 5 sec hold max flex & 5 sec hold ext lifting LLE off plate 15 reps 2 sets      Knee/Hip Exercises: Standing   Knee Flexion Strengthening;AAROM;Right;2 sets;10 reps   3#   Knee Flexion Limitations standing LLE on step for clearance, AROM ~60* then AAROM with strap    Forward Step Up Right;10 reps;Hand Hold: 1;Step Height: 8"    Forward Step Up Limitations cues on proper technique including LLE PF     Step Down Right;1 set;10 reps;Hand Hold: 1;Step Height: 8"    Step Down Limitations cues on proper technique including initial contact with LLE PF then sitting heel to floor with RLE still on step flexing.        Knee/Hip Exercises: Seated   Sit to Sand 15 reps;with UE support   toes against wall goal sitting at edge of chair     Knee/Hip Exercises: Prone   Prone Knee Hang 3 minutes;Weights    Prone Knee Hang Weights (lbs) 3    Prone Knee Hang Limitations PT instructed as HEP      Vasopneumatic   Number Minutes Vasopneumatic  10 minutes    Vasopnuematic Location  Knee   Intermittent TKE positioning for stretch   Vasopneumatic Pressure Medium    Vasopneumatic Temperature  34                    PT Short Term Goals - 08/07/20 0843      PT SHORT TERM GOAL #1   Title Pt will be independent with HEP    Status Achieved             PT Long Term Goals - 09/30/20 1617      PT LONG TERM GOAL #1   Title Left Knee PROM ext -1* to flex 100*    Time 4    Period Weeks    Status Revised    Target Date 10/30/20      PT LONG TERM GOAL #2   Title left Knee AROM seated extension -5*    Time 4    Period Weeks    Status Revised    Target Date 10/30/20      PT LONG TERM GOAL #3   Title AROM knee flexion standing 75*    Time 4    Period Weeks    Status Revised    Target Date 10/30/20      PT LONG TERM GOAL #4   Title Patient able to ascend & descend ramps & curbs without device and stairs single rail alternating pattern modified independent.    Time 4    Period Weeks    Status Revised    Target Date 10/30/20      PT LONG TERM GOAL #5   Status On-going                 Plan - 10/05/20 0805    Clinical Impression Statement Patient is able to passively & actively extend knee better. She is slowly progressing with PT for functional range and strength.    Examination-Activity  Limitations Locomotion Level;Stand    Examination-Participation Restrictions Community Activity;Interpersonal Relationship    Stability/Clinical Decision Making Stable/Uncomplicated    Rehab Potential Good    PT Frequency 2x / week    PT Duration 4 weeks    PT Treatment/Interventions ADLs/Self Care  Home Management;Cryotherapy;Electrical Stimulation;Gait training;Stair training;Functional mobility training;Therapeutic activities;Therapeutic exercise;Balance training;Neuromuscular re-education;Manual techniques;Patient/family education;Scar mobilization;Passive range of motion;Vasopneumatic Device    PT Next Visit Plan measure, continue with aggressive ROM & therapuetic exercise    PT Home Exercise Plan Tailgate knee flexion, self overpressure into extension and stationary bike,   Medbridge Access [email or web address]    Consulted and Agree with Plan of Care Patient           Patient will benefit from skilled therapeutic intervention in order to improve the following deficits and impairments:  Abnormal gait, Decreased range of motion, Difficulty walking, Decreased endurance, Pain, Impaired flexibility, Increased edema  Visit Diagnosis: Stiffness of right knee, not elsewhere classified  Difficulty walking  Localized edema  Chronic pain of right knee  Muscle weakness (generalized)  Acute pain of right knee     Problem List Patient Active Problem List   Diagnosis Date Noted  . Arthrofibrosis of total knee replacement (HCC) 08/26/2020  . Status post total right knee replacement 07/17/2020  . Unilateral primary osteoarthritis, right knee 12/30/2019  . Acute right-sided low back pain with right-sided sciatica 12/25/2017  . Health maintenance examination 09/30/2013  . Hyperlipidemia 09/30/2013    [name] PT, DPT 10/05/2020, 9:04 AM  [facility] [address] [place], [place], [address] Phone: [phone]   Fax:  [phone]  Name: Vernessa Likes MRN: [id number] Date of Birth: [birth year]

## 2020-10-07 ENCOUNTER — Other Ambulatory Visit: Payer: Self-pay

## 2020-10-07 ENCOUNTER — Encounter: Payer: Self-pay | Admitting: Physical Therapy

## 2020-10-07 ENCOUNTER — Ambulatory Visit: Payer: Medicare Other | Admitting: Physical Therapy

## 2020-10-07 DIAGNOSIS — M25661 Stiffness of right knee, not elsewhere classified: Secondary | ICD-10-CM | POA: Diagnosis not present

## 2020-10-07 DIAGNOSIS — R262 Difficulty in walking, not elsewhere classified: Secondary | ICD-10-CM | POA: Diagnosis not present

## 2020-10-07 DIAGNOSIS — M25561 Pain in right knee: Secondary | ICD-10-CM | POA: Diagnosis not present

## 2020-10-07 DIAGNOSIS — G8929 Other chronic pain: Secondary | ICD-10-CM

## 2020-10-07 DIAGNOSIS — R600 Localized edema: Secondary | ICD-10-CM

## 2020-10-07 DIAGNOSIS — M6281 Muscle weakness (generalized): Secondary | ICD-10-CM

## 2020-10-07 NOTE — Therapy (Signed)
[facility] [address] [place], [place], [address] Phone: [phone]   Fax:  [phone]  Physical Therapy Treatment  Patient Details  Name: Nancy Eaton MRN: [id number] Date of Birth: 1956-11-10 Referring Provider (PT): Magnus Ivan   Encounter Date: 10/07/2020   PT End of Session - 10/07/20 0958    Visit Number 20    Number of Visits 26    Date for PT Re-Evaluation 10/30/20    Progress Note Due on Visit 28    PT Start Time 0800    PT Stop Time 0855    PT Time Calculation (min) 55 min    Activity Tolerance Patient tolerated treatment well;Patient limited by pain    Behavior During Therapy [facility] for tasks assessed/performed           Past Medical History:  Diagnosis Date  . Arthritis    Knee  . Hyperlipidemia    Lovastatin started approx 2011  . IFG (impaired fasting glucose) 2018   HbA1c 5.9% (stable)  . PONV (postoperative nausea and vomiting)   . Pre-diabetes   . Right knee pain 12/2019   End stage: TKA recommended by ortho 01/2020->pt considering  . Sigmoid diverticulosis 04/2010   "     "     "    "    Past Surgical History:  Procedure Laterality Date  . BREAST ENHANCEMENT SURGERY     As of mammogram 12/2017--rupture of implants stable.  Marland Kitchen CARPAL TUNNEL RELEASE    . CESAREAN SECTION    . COLONOSCOPY  age 63   [facility]--recall 10 yrs  . TOTAL KNEE ARTHROPLASTY Right 07/17/2020   Procedure: RIGHT TOTAL KNEE ARTHROPLASTY;  Surgeon: Kathryne Hitch, MD;  Location: [facility];  Service: Orthopedics;  Laterality: Right;    There were no vitals filed for this visit.   Subjective Assessment - 10/07/20 0801    Subjective She took sleeping pill to help sleep.    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 Yes    Pain Score 3    was 7/10 upon awakening   Pain Location Back    Pain Orientation Lower;Right    Pain Descriptors /  Indicators Aching;Sore;Tightness    Pain Type Acute pain    Pain Onset More than a month ago    Pain Frequency Intermittent    Aggravating Factors  standing at work    Pain Relieving Factors ibuprofen              [facility] PT Assessment - 10/07/20 0001      PROM   Right Knee Extension -1    Right Knee Flexion 95                         [facility] Adult PT Treatment/Exercise - 10/07/20 0801      Self-Care   Self-Care --    ADL's --      Knee/Hip Exercises: Stretches   Passive Hamstring Stretch Right;5 reps;30 seconds    Passive Hamstring Stretch Limitations supine RLE ext on leg press with strap DF    Quad Stretch Right;3 reps;20 seconds    Quad Stretch Limitations supine with RLE over edge & strap to assist.     Knee: Self-Stretch to increase Flexion Right;3 reps;20 seconds    Knee: Self-Stretch Limitations lunge stretch foot in chair  Gastroc Stretch Both;30 seconds;2 reps    Theme park manager Limitations off aerobic step heel depression      Knee/Hip Exercises: Aerobic   Stationary Bike Seat 8 for 8 minutes AROM, rocking initial 3 min, then back 3-5 min, last 3 min full revolution forward with trunk lean      Knee/Hip Exercises: Machines for Strengthening   Cybex Knee Extension RLE 10# 2 sets x 10reps    Cybex Leg Press Shuttle leg press 106# BLE 5 sec hold max flex & 5 sec hold ext lifting LLE off plate 15 reps 2 sets 1st set back 45*  2nd set back flat      Knee/Hip Exercises: Standing   Knee Flexion Strengthening;AAROM;Right;2 sets;10 reps   3#   Knee Flexion Limitations standing LLE on step for clearance, AROM ~60* then AAROM with strap    Forward Step Up Right;10 reps;Hand Hold: 1;Step Height: 8"    Forward Step Up Limitations cues on proper technique including LLE PF     Step Down Right;1 set;10 reps;Hand Hold: 1;Step Height: 8"    Step Down Limitations cues on proper technique including initial contact with LLE PF then sitting heel to floor with RLE  still on step flexing.       Knee/Hip Exercises: Prone   Prone Knee Hang 3 minutes;Weights    Prone Knee Hang Weights (lbs) 3    Prone Knee Hang Limitations PT instructed as HEP      Vasopneumatic   Number Minutes Vasopneumatic  10 minutes    Vasopnuematic Location  Knee   Intermittent TKE positioning for stretch   Vasopneumatic Pressure Medium    Vasopneumatic Temperature  34                    PT Short Term Goals - 08/07/20 0843      PT SHORT TERM GOAL #1   Title Pt will be independent with HEP    Status Achieved             PT Long Term Goals - 09/30/20 1617      PT LONG TERM GOAL #1   Title Left Knee PROM ext -1* to flex 100*    Time 4    Period Weeks    Status Revised    Target Date 10/30/20      PT LONG TERM GOAL #2   Title left Knee AROM seated extension -5*    Time 4    Period Weeks    Status Revised    Target Date 10/30/20      PT LONG TERM GOAL #3   Title AROM knee flexion standing 75*    Time 4    Period Weeks    Status Revised    Target Date 10/30/20      PT LONG TERM GOAL #4   Title Patient able to ascend & descend ramps & curbs without device and stairs single rail alternating pattern modified independent.    Time 4    Period Weeks    Status Revised    Target Date 10/30/20      PT LONG TERM GOAL #5   Status On-going                 Plan - 10/07/20 0959    Clinical Impression Statement Patient is improving functional range with open & closed chain exercises.  PT advanced difficulty of exercises.    Examination-Activity Limitations Locomotion Level;Stand    Examination-Participation Restrictions  Community Activity;Interpersonal Relationship    Stability/Clinical Decision Making Stable/Uncomplicated    Rehab Potential Good    PT Frequency 2x / week    PT Duration 4 weeks    PT Treatment/Interventions ADLs/Self Care Home Management;Cryotherapy;Electrical Stimulation;Gait training;Stair training;Functional mobility  training;Therapeutic activities;Therapeutic exercise;Balance training;Neuromuscular re-education;Manual techniques;Patient/family education;Scar mobilization;Passive range of motion;Vasopneumatic Device    PT Next Visit Plan measure, continue with aggressive ROM & therapuetic exercise    PT Home Exercise Plan Tailgate knee flexion, self overpressure into extension and stationary bike,   Medbridge Access [email or web address]    Consulted and Agree with Plan of Care Patient           Patient will benefit from skilled therapeutic intervention in order to improve the following deficits and impairments:  Abnormal gait, Decreased range of motion, Difficulty walking, Decreased endurance, Pain, Impaired flexibility, Increased edema  Visit Diagnosis: Stiffness of right knee, not elsewhere classified  Difficulty walking  Localized edema  Chronic pain of right knee  Muscle weakness (generalized)  Acute pain of right knee     Problem List Patient Active Problem List   Diagnosis Date Noted  . Arthrofibrosis of total knee replacement (HCC) 08/26/2020  . Status post total right knee replacement 07/17/2020  . Unilateral primary osteoarthritis, right knee 12/30/2019  . Acute right-sided low back pain with right-sided sciatica 12/25/2017  . Health maintenance examination 09/30/2013  . Hyperlipidemia 09/30/2013    [name], PT, DPT 10/07/2020, 10:06 AM  [facility] Therapy [address] [place], [place], [address] Phone: [phone]   Fax:  [phone]  Name: Nancy Eaton MRN: [id number] Date of Birth: Dec 25, 1955

## 2020-10-12 ENCOUNTER — Ambulatory Visit (INDEPENDENT_AMBULATORY_CARE_PROVIDER_SITE_OTHER): Payer: Medicare Other | Admitting: Orthopaedic Surgery

## 2020-10-12 ENCOUNTER — Encounter: Payer: Self-pay | Admitting: Orthopaedic Surgery

## 2020-10-12 ENCOUNTER — Encounter: Payer: Self-pay | Admitting: Physical Therapy

## 2020-10-12 ENCOUNTER — Other Ambulatory Visit: Payer: Self-pay

## 2020-10-12 ENCOUNTER — Ambulatory Visit (INDEPENDENT_AMBULATORY_CARE_PROVIDER_SITE_OTHER): Payer: Medicare Other | Admitting: Physical Therapy

## 2020-10-12 DIAGNOSIS — T8482XD Fibrosis due to internal orthopedic prosthetic devices, implants and grafts, subsequent encounter: Secondary | ICD-10-CM

## 2020-10-12 DIAGNOSIS — R600 Localized edema: Secondary | ICD-10-CM

## 2020-10-12 DIAGNOSIS — G8929 Other chronic pain: Secondary | ICD-10-CM

## 2020-10-12 DIAGNOSIS — Z96651 Presence of right artificial knee joint: Secondary | ICD-10-CM

## 2020-10-12 DIAGNOSIS — R262 Difficulty in walking, not elsewhere classified: Secondary | ICD-10-CM

## 2020-10-12 DIAGNOSIS — M25661 Stiffness of right knee, not elsewhere classified: Secondary | ICD-10-CM

## 2020-10-12 DIAGNOSIS — M25561 Pain in right knee: Secondary | ICD-10-CM

## 2020-10-12 DIAGNOSIS — M6281 Muscle weakness (generalized): Secondary | ICD-10-CM

## 2020-10-12 NOTE — Progress Notes (Signed)
The patient is now getting close to 3 months status post a right total knee arthroplasty.  In the interim we did take her to the operating room for manipulation under anesthesia.  She says she is feeling better and her range of motion is improving with PT.  She is not having to walk with an assistive device and she is not taking anything other than Tylenol for pain.  On exam her extension is almost full.  Her flexion is to barely past 90 degrees with the right knee.  She will continue to push herself hard through therapy.  From my standpoint, I do not need to see her back for 3 months.  At that visit I would like an AP and lateral of her right knee.  If there is any issues before then she knows to let us know.

## 2020-10-12 NOTE — Therapy (Signed)
[facility] Therapy [address] [place], [place], [address] Phone: [phone]   Fax:  [phone]  Physical Therapy Treatment  Patient Details  Name: Nancy Eaton MRN: [id number] Date of Birth: 05-24-1956 Referring Provider (PT): Magnus Ivan   Encounter Date: 10/12/2020   PT End of Session - 10/12/20 0842    Visit Number 21    Number of Visits 26    Date for PT Re-Evaluation 10/30/20    Progress Note Due on Visit 28    PT Start Time 0801    PT Stop Time 0844    PT Time Calculation (min) 43 min    Activity Tolerance Patient tolerated treatment well;Patient limited by pain    Behavior During Therapy [facility] for tasks assessed/performed           Past Medical History:  Diagnosis Date  . Arthritis    Knee  . Hyperlipidemia    Lovastatin started approx 2011  . IFG (impaired fasting glucose) 2018   HbA1c 5.9% (stable)  . PONV (postoperative nausea and vomiting)   . Pre-diabetes   . Right knee pain 12/2019   End stage: TKA recommended by ortho 01/2020->pt considering  . Sigmoid diverticulosis 04/2010   "     "     "    "    Past Surgical History:  Procedure Laterality Date  . BREAST ENHANCEMENT SURGERY     As of mammogram 12/2017--rupture of implants stable.  Marland Kitchen CARPAL TUNNEL RELEASE    . CESAREAN SECTION    . COLONOSCOPY  age 89   [facility]--recall 10 yrs  . TOTAL KNEE ARTHROPLASTY Right 07/17/2020   Procedure: RIGHT TOTAL KNEE ARTHROPLASTY;  Surgeon: Kathryne Hitch, MD;  Location: [facility];  Service: Orthopedics;  Laterality: Right;    There were no vitals filed for this visit.   Subjective Assessment - 10/12/20 0801    Subjective She has been exercising including return to [employer]. She is a little sore from new activity but not bad.    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 No/denies    Pain Onset More than a month ago                [facility] PT Assessment - 10/12/20 0801      PROM   Right Knee Extension 0   prone   Right Knee Flexion 97   supine                        [facility] Adult PT Treatment/Exercise - 10/12/20 0801      Knee/Hip Exercises: Stretches   Passive Hamstring Stretch Right;5 reps;30 seconds    Passive Hamstring Stretch Limitations supine RLE ext on leg press with strap DF    Quad Stretch Right;3 reps;20 seconds    Quad Stretch Limitations supine with RLE over edge & strap to assist.     Knee: Self-Stretch to increase Flexion Right;3 reps;20 seconds    Knee: Self-Stretch Limitations lunge stretch foot in chair    Gastroc Stretch Both;30 seconds;2 reps    Gastroc Stretch Limitations off aerobic step heel depression      Knee/Hip Exercises: Aerobic   Stationary Bike Seat 8 for 8 minutes AROM, rocking initial 1 min, then back 2-3 min, last 5 min full revolution forward with trunk lean      Knee/Hip Exercises:  Machines for Strengthening   Cybex Leg Press Shuttle leg press 112# BLE 5 sec hold max flex & 5 sec hold ext lifting LLE off plate 15 reps 2 sets 1st set back 45*  2nd set back flat      Knee/Hip Exercises: Standing   Knee Flexion Strengthening;AAROM;Right;2 sets;10 reps   4#   Knee Flexion Limitations standing LLE on step for clearance, AROM ~60* then AAROM with strap    Functional Squat 1 set;15 reps;5 seconds    Functional Squat Limitations stand to sit with BUE on armrests toes to wall with cues on knee flexion      Knee/Hip Exercises: Supine   Heel Slides AAROM;Right;1 set;15 reps    Heel Slides Limitations foot on ball with strap to assist flexion     Knee Flexion AROM;Right;1 set;10 reps    Knee Flexion Limitations femur vertical AROM knee flexion 5 sec hold 15 reps      Knee/Hip Exercises: Prone   Prone Knee Hang 3 minutes;Weights    Prone Knee Hang Weights (lbs) 4    Other Prone Exercises quadraped with knee flexion stretch 10 sec 10 reps.                      PT Short Term Goals - 08/07/20 0843      PT SHORT TERM GOAL #1   Title Pt will be independent with HEP    Status Achieved             PT Long Term Goals - 09/30/20 1617      PT LONG TERM GOAL #1   Title Left Knee PROM ext -1* to flex 100*    Time 4    Period Weeks    Status Revised    Target Date 10/30/20      PT LONG TERM GOAL #2   Title left Knee AROM seated extension -5*    Time 4    Period Weeks    Status Revised    Target Date 10/30/20      PT LONG TERM GOAL #3   Title AROM knee flexion standing 75*    Time 4    Period Weeks    Status Revised    Target Date 10/30/20      PT LONG TERM GOAL #4   Title Patient able to ascend & descend ramps & curbs without device and stairs single rail alternating pattern modified independent.    Time 4    Period Weeks    Status Revised    Target Date 10/30/20      PT LONG TERM GOAL #5   Status On-going                 Plan - 10/12/20 7341    Clinical Impression Statement Patient's knee range is slowly progressing with PT & HEP.  She is also improving RLE strength with PT increasing weights.  She reports no knee pain but only stiffness now.    Examination-Activity Limitations Locomotion Level;Stand    Examination-Participation Restrictions Community Activity;Interpersonal Relationship    Stability/Clinical Decision Making Stable/Uncomplicated    Rehab Potential Good    PT Frequency 2x / week    PT Duration 4 weeks    PT Treatment/Interventions ADLs/Self Care Home Management;Cryotherapy;Electrical Stimulation;Gait training;Stair training;Functional mobility training;Therapeutic activities;Therapeutic exercise;Balance training;Neuromuscular re-education;Manual techniques;Patient/family education;Scar mobilization;Passive range of motion;Vasopneumatic Device    PT Next Visit Plan measure, continue with aggressive ROM & therapuetic exercise  PT Home Exercise Plan Tailgate knee flexion, self  overpressure into extension and stationary bike,   Medbridge Access [email or web address]    Consulted and Agree with Plan of Care Patient           Patient will benefit from skilled therapeutic intervention in order to improve the following deficits and impairments:  Abnormal gait, Decreased range of motion, Difficulty walking, Decreased endurance, Pain, Impaired flexibility, Increased edema  Visit Diagnosis: Stiffness of right knee, not elsewhere classified  Difficulty walking  Localized edema  Chronic pain of right knee  Muscle weakness (generalized)     Problem List Patient Active Problem List   Diagnosis Date Noted  . Arthrofibrosis of total knee replacement (HCC) 08/26/2020  . Status post total right knee replacement 07/17/2020  . Unilateral primary osteoarthritis, right knee 12/30/2019  . Acute right-sided low back pain with right-sided sciatica 12/25/2017  . Health maintenance examination 09/30/2013  . Hyperlipidemia 09/30/2013    [name], PT, DPT 10/12/2020, 8:44 AM  [facility] Physical Therapy [address] [place], [place], [address] Phone: [phone]   Fax:  [phone]  Name: Nancy Eaton MRN: [id number] Date of Birth: 1956/04/20

## 2020-10-14 ENCOUNTER — Encounter: Payer: Self-pay | Admitting: Physical Therapy

## 2020-10-14 ENCOUNTER — Other Ambulatory Visit: Payer: Self-pay

## 2020-10-14 ENCOUNTER — Ambulatory Visit: Payer: Medicare Other | Admitting: Physical Therapy

## 2020-10-14 DIAGNOSIS — R262 Difficulty in walking, not elsewhere classified: Secondary | ICD-10-CM | POA: Diagnosis not present

## 2020-10-14 DIAGNOSIS — M25661 Stiffness of right knee, not elsewhere classified: Secondary | ICD-10-CM

## 2020-10-14 DIAGNOSIS — M25561 Pain in right knee: Secondary | ICD-10-CM | POA: Diagnosis not present

## 2020-10-14 DIAGNOSIS — G8929 Other chronic pain: Secondary | ICD-10-CM

## 2020-10-14 DIAGNOSIS — R600 Localized edema: Secondary | ICD-10-CM | POA: Diagnosis not present

## 2020-10-14 DIAGNOSIS — M6281 Muscle weakness (generalized): Secondary | ICD-10-CM

## 2020-10-14 NOTE — Therapy (Signed)
[facility] Therapy [address] [place], [place], [address] Phone: [phone]   Fax:  [phone]  Physical Therapy Treatment  Patient Details  Name: Nancy Eaton MRN: [id number] Date of Birth: 06/10/1956 Referring Provider (PT): Magnus Ivan   Encounter Date: 10/14/2020   PT End of Session - 10/14/20 0802    Visit Number 22    Number of Visits 26    Date for PT Re-Evaluation 10/30/20    Progress Note Due on Visit 28    PT Start Time 0800    PT Stop Time 0853    PT Time Calculation (min) 53 min    Activity Tolerance Patient tolerated treatment well;Patient limited by pain    Behavior During Therapy [facility] for tasks assessed/performed           Past Medical History:  Diagnosis Date  . Arthritis    Knee  . Hyperlipidemia    Lovastatin started approx 2011  . IFG (impaired fasting glucose) 2018   HbA1c 5.9% (stable)  . PONV (postoperative nausea and vomiting)   . Pre-diabetes   . Right knee pain 12/2019   End stage: TKA recommended by ortho 01/2020->pt considering  . Sigmoid diverticulosis 04/2010   "     "     "    "    Past Surgical History:  Procedure Laterality Date  . BREAST ENHANCEMENT SURGERY     As of mammogram 12/2017--rupture of implants stable.  Marland Kitchen CARPAL TUNNEL RELEASE    . CESAREAN SECTION    . COLONOSCOPY  age 72   [facility]--recall 10 yrs  . TOTAL KNEE ARTHROPLASTY Right 07/17/2020   Procedure: RIGHT TOTAL KNEE ARTHROPLASTY;  Surgeon: Kathryne Hitch, MD;  Location: [facility];  Service: Orthopedics;  Laterality: Right;    There were no vitals filed for this visit.   Subjective Assessment - 10/14/20 0800    Subjective She saw Dr. Magnus Ivan yesterday who is okay with progress and will see her in 3 months.  She continues to exercises. She is back to work full time but limits her progress.    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  No/denies    Pain Onset --              [facility] PT Assessment - 10/14/20 0001      Assessment   Medical Diagnosis R TKA    Referring Provider (PT) Magnus Ivan    Onset Date/Surgical Date 07/17/20      PROM   Right Knee Extension 0    Right Knee Flexion 98                         [facility] Adult PT Treatment/Exercise - 10/14/20 0800      Knee/Hip Exercises: Stretches   Passive Hamstring Stretch Right;5 reps;30 seconds    Passive Hamstring Stretch Limitations supine RLE ext on leg press with strap DF    Quad Stretch --    Quad Stretch Limitations --    Knee: Self-Stretch to increase Flexion Right;3 reps;20 seconds    Knee: Self-Stretch Limitations lunge stretch foot in chair    Gastroc Stretch Both;30 seconds;2 reps    Gastroc Stretch Limitations off aerobic step heel depression      Knee/Hip Exercises: Aerobic   Stationary Bike Seat 8 for 8 minutes AROM, rocking initial 1 min, then  back 2-3 min, last 5 min full revolution forward with trunk lean      Knee/Hip Exercises: Machines for Strengthening   Cybex Knee Extension RLE 10# 2 sets x 10reps 5sec hold in flexion    Cybex Leg Press Shuttle leg press 112# BLE 5 sec hold max flex & 5 sec hold ext lifting LLE off plate 15 reps 2 sets 1st set back 45*  2nd set back flat      Knee/Hip Exercises: Standing   Forward Step Up Right;10 reps;Hand Hold: 1;Step Height: 8"    Step Down Right;1 set;10 reps;Hand Hold: 1;Step Height: 8"    Functional Squat 1 set;15 reps;5 seconds    Functional Squat Limitations stand to sit with BUE on armrests toes to wall with cues on knee flexion    Rocker Board 1 minute    Rocker Board Limitations ant/post light UE support.     Other Standing Knee Exercises tandem stance on foam beam RLE in front 60 sec & RLE in back 60 sec      Knee/Hip Exercises: Supine   Heel Slides AAROM;Right;1 set;15 reps    Heel Slides Limitations foot on ball with strap to assist flexion     Knee Flexion  AROM;Right;1 set;10 reps    Knee Flexion Limitations femur vertical AROM knee flexion 5 sec hold 15 reps      Knee/Hip Exercises: Prone   Prone Knee Hang --    Prone Knee Hang Weights (lbs) --    Other Prone Exercises --      Vasopneumatic   Number Minutes Vasopneumatic  10 minutes    Vasopnuematic Location  Knee   intermittent TKE position   Vasopneumatic Pressure Medium    Vasopneumatic Temperature  34                    PT Short Term Goals - 08/07/20 0843      PT SHORT TERM GOAL #1   Title Pt will be independent with HEP    Status Achieved             PT Long Term Goals - 09/30/20 1617      PT LONG TERM GOAL #1   Title Left Knee PROM ext -1* to flex 100*    Time 4    Period Weeks    Status Revised    Target Date 10/30/20      PT LONG TERM GOAL #2   Title left Knee AROM seated extension -5*    Time 4    Period Weeks    Status Revised    Target Date 10/30/20      PT LONG TERM GOAL #3   Title AROM knee flexion standing 75*    Time 4    Period Weeks    Status Revised    Target Date 10/30/20      PT LONG TERM GOAL #4   Title Patient able to ascend & descend ramps & curbs without device and stairs single rail alternating pattern modified independent.    Time 4    Period Weeks    Status Revised    Target Date 10/30/20      PT LONG TERM GOAL #5   Status On-going                 Plan - 10/14/20 0802    Clinical Impression Statement PT added functional standing activities to in clinic program. PT also emphasizing increasing range both passively &  actively.    Examination-Activity Limitations Locomotion Level;Stand    Examination-Participation Restrictions Community Activity;Interpersonal Relationship    Stability/Clinical Decision Making Stable/Uncomplicated    Rehab Potential Good    PT Frequency 2x / week    PT Duration 4 weeks    PT Treatment/Interventions ADLs/Self Care Home Management;Cryotherapy;Electrical Stimulation;Gait  training;Stair training;Functional mobility training;Therapeutic activities;Therapeutic exercise;Balance training;Neuromuscular re-education;Manual techniques;Patient/family education;Scar mobilization;Passive range of motion;Vasopneumatic Device    PT Next Visit Plan measure, continue with aggressive ROM & therapuetic exercise    PT Home Exercise Plan Tailgate knee flexion, self overpressure into extension and stationary bike,   Medbridge Access [email or web address]    Consulted and Agree with Plan of Care Patient           Patient will benefit from skilled therapeutic intervention in order to improve the following deficits and impairments:  Abnormal gait, Decreased range of motion, Difficulty walking, Decreased endurance, Pain, Impaired flexibility, Increased edema  Visit Diagnosis: Stiffness of right knee, not elsewhere classified  Difficulty walking  Localized edema  Chronic pain of right knee  Muscle weakness (generalized)  Acute pain of right knee     Problem List Patient Active Problem List   Diagnosis Date Noted  . Arthrofibrosis of total knee replacement (HCC) 08/26/2020  . Status post total right knee replacement 07/17/2020  . Unilateral primary osteoarthritis, right knee 12/30/2019  . Acute right-sided low back pain with right-sided sciatica 12/25/2017  . Health maintenance examination 09/30/2013  . Hyperlipidemia 09/30/2013    [name] PT, DPT 10/14/2020, 10:00 AM  [facility] [address] [place], [place], [address] Phone: [phone]   Fax:  [phone]  Name: Nancy Eaton MRN: [id number] Date of Birth: May 12, 1956

## 2020-10-19 ENCOUNTER — Encounter: Payer: Self-pay | Admitting: Physical Therapy

## 2020-10-19 ENCOUNTER — Other Ambulatory Visit: Payer: Self-pay

## 2020-10-19 ENCOUNTER — Ambulatory Visit (INDEPENDENT_AMBULATORY_CARE_PROVIDER_SITE_OTHER): Payer: Medicare Other | Admitting: Physical Therapy

## 2020-10-19 DIAGNOSIS — M6281 Muscle weakness (generalized): Secondary | ICD-10-CM

## 2020-10-19 DIAGNOSIS — M25661 Stiffness of right knee, not elsewhere classified: Secondary | ICD-10-CM

## 2020-10-19 DIAGNOSIS — R600 Localized edema: Secondary | ICD-10-CM | POA: Diagnosis not present

## 2020-10-19 DIAGNOSIS — G8929 Other chronic pain: Secondary | ICD-10-CM

## 2020-10-19 DIAGNOSIS — R262 Difficulty in walking, not elsewhere classified: Secondary | ICD-10-CM | POA: Diagnosis not present

## 2020-10-19 DIAGNOSIS — M25561 Pain in right knee: Secondary | ICD-10-CM | POA: Diagnosis not present

## 2020-10-19 NOTE — Therapy (Signed)
[facility] Physical Therapy [address] [place], [place], [address] Phone: [phone]   Fax:  [phone]  Physical Therapy Treatment  Patient Details  Name: Nancy Eaton MRN: [id number] Date of Birth: February 14, 1956 Referring Provider (PT): Magnus Ivan   Encounter Date: 10/19/2020   PT End of Session - 10/19/20 0800    Visit Number 23    Number of Visits 26    Date for PT Re-Evaluation 10/30/20    Progress Note Due on Visit 28    PT Start Time 0800    PT Stop Time 0855    PT Time Calculation (min) 55 min    Activity Tolerance Patient tolerated treatment well;Patient limited by pain    Behavior During Therapy [facility] for tasks assessed/performed           Past Medical History:  Diagnosis Date  . Arthritis    Knee  . Hyperlipidemia    Lovastatin started approx 2011  . IFG (impaired fasting glucose) 2018   HbA1c 5.9% (stable)  . PONV (postoperative nausea and vomiting)   . Pre-diabetes   . Right knee pain 12/2019   End stage: TKA recommended by ortho 01/2020->pt considering  . Sigmoid diverticulosis 04/2010   "     "     "    "    Past Surgical History:  Procedure Laterality Date  . BREAST ENHANCEMENT SURGERY     As of mammogram 12/2017--rupture of implants stable.  Marland Kitchen CARPAL TUNNEL RELEASE    . CESAREAN SECTION    . COLONOSCOPY  age 64   [facility]--recall 10 yrs  . TOTAL KNEE ARTHROPLASTY Right 07/17/2020   Procedure: RIGHT TOTAL KNEE ARTHROPLASTY;  Surgeon: Kathryne Hitch, MD;  Location: [facility];  Service: Orthopedics;  Laterality: Right;    There were no vitals filed for this visit.   Subjective Assessment - 10/19/20 0800    Subjective She questions getting thru security when flying.    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 No/denies              [facility] PT Assessment - 10/19/20 0001      Assessment   Medical Diagnosis R TKA     Referring Provider (PT) Magnus Ivan      PROM   Right Knee Extension 0    Right Knee Flexion 98                         [facility] Adult PT Treatment/Exercise - 10/19/20 0800      Knee/Hip Exercises: Stretches   Passive Hamstring Stretch Right;20 seconds;3 reps    Passive Hamstring Stretch Limitations supine RLE ext SLR with strap DF    Knee: Self-Stretch to increase Flexion Right;3 reps;20 seconds    Knee: Self-Stretch Limitations lunge stretch foot in chair    Gastroc Stretch Both;30 seconds;2 reps    Gastroc Stretch Limitations off aerobic step heel depression      Knee/Hip Exercises: Aerobic   Stationary Bike Seat 8 for 8 minutes AROM, rocking initial 1 min, then full revolution 7 min Level 2 for last 6 minutes.       Knee/Hip Exercises: Machines for Strengthening   Cybex Knee Extension RLE 10# 2 sets x 10reps 5sec hold in flexion bw reps & 60sec bw sets    Cybex Leg Press Shuttle leg press 112# BLE 5  sec hold max flex & 5 sec hold ext lifting LLE off plate 15 reps 2 sets 1st set back 45*  2nd set back flat      Knee/Hip Exercises: Standing   Forward Step Up Right;10 reps;Hand Hold: 1;Step Height: 8"    Step Down Right;1 set;10 reps;Hand Hold: 1;Step Height: 8"    Functional Squat 1 set;15 reps;5 seconds    Functional Squat Limitations stand to sit with BUE on armrests toes to wall with cues on knee flexion    Rocker Board 1 minute   4 directions   Rocker Board Limitations round board: light UE support - ant/post,  right / left, circles CW & CCW    Other Standing Knee Exercises tandem stance on foam beam RLE in front 60 sec & RLE in back 60 sec      Knee/Hip Exercises: Supine   Heel Slides AAROM;Right;1 set;15 reps    Heel Slides Limitations foot on ball with strap to assist flexion     Knee Flexion AROM;Right;1 set;10 reps    Knee Flexion Limitations femur vertical AROM knee flexion 5 sec hold 15 reps      Vasopneumatic   Number Minutes Vasopneumatic  10 minutes      Vasopnuematic Location  Knee   intermittent TKE position   Vasopneumatic Pressure Medium    Vasopneumatic Temperature  34                    PT Short Term Goals - 08/07/20 0843      PT SHORT TERM GOAL #1   Title Pt will be independent with HEP    Status Achieved             PT Long Term Goals - 09/30/20 1617      PT LONG TERM GOAL #1   Title Left Knee PROM ext -1* to flex 100*    Time 4    Period Weeks    Status Revised    Target Date 10/30/20      PT LONG TERM GOAL #2   Title left Knee AROM seated extension -5*    Time 4    Period Weeks    Status Revised    Target Date 10/30/20      PT LONG TERM GOAL #3   Title AROM knee flexion standing 75*    Time 4    Period Weeks    Status Revised    Target Date 10/30/20      PT LONG TERM GOAL #4   Title Patient able to ascend & descend ramps & curbs without device and stairs single rail alternating pattern modified independent.    Time 4    Period Weeks    Status Revised    Target Date 10/30/20      PT LONG TERM GOAL #5   Status On-going                 Plan - 10/19/20 0802    Clinical Impression Statement Patient able to perform cycling with full revolution and resistance today.  She is able to perform increased control with standing activities.    Examination-Activity Limitations Locomotion Level;Stand    Examination-Participation Restrictions Community Activity;Interpersonal Relationship    Stability/Clinical Decision Making Stable/Uncomplicated    Rehab Potential Good    PT Frequency 2x / week    PT Duration 4 weeks    PT Treatment/Interventions ADLs/Self Care Home Management;Cryotherapy;Electrical Stimulation;Gait training;Stair training;Functional mobility training;Therapeutic activities;Therapeutic exercise;Balance training;Neuromuscular  re-education;Manual techniques;Patient/family education;Scar mobilization;Passive range of motion;Vasopneumatic Device    PT Next Visit Plan measure,  continue with aggressive ROM & therapuetic exercise    PT Home Exercise Plan Tailgate knee flexion, self overpressure into extension and stationary bike,   Medbridge Access [email or web address]    Consulted and Agree with Plan of Care Patient           Patient will benefit from skilled therapeutic intervention in order to improve the following deficits and impairments:  Abnormal gait, Decreased range of motion, Difficulty walking, Decreased endurance, Pain, Impaired flexibility, Increased edema  Visit Diagnosis: Stiffness of right knee, not elsewhere classified  Difficulty walking  Localized edema  Chronic pain of right knee  Muscle weakness (generalized)  Acute pain of right knee     Problem List Patient Active Problem List   Diagnosis Date Noted  . Arthrofibrosis of total knee replacement (HCC) 08/26/2020  . Status post total right knee replacement 07/17/2020  . Unilateral primary osteoarthritis, right knee 12/30/2019  . Acute right-sided low back pain with right-sided sciatica 12/25/2017  . Health maintenance examination 09/30/2013  . Hyperlipidemia 09/30/2013    [name] PT, DPT 10/19/2020, 3:41 PM  [facility] Physical Therapy [address] [place], [place], [address] Phone: [phone]   Fax:  [phone]  Name: Nancy Eaton MRN: [id number] Date of Birth: 1956-01-20

## 2020-10-21 ENCOUNTER — Encounter: Payer: Self-pay | Admitting: Physical Therapy

## 2020-10-21 ENCOUNTER — Other Ambulatory Visit: Payer: Self-pay

## 2020-10-21 ENCOUNTER — Ambulatory Visit: Payer: Medicare Other | Admitting: Physical Therapy

## 2020-10-21 DIAGNOSIS — R600 Localized edema: Secondary | ICD-10-CM

## 2020-10-21 DIAGNOSIS — R262 Difficulty in walking, not elsewhere classified: Secondary | ICD-10-CM | POA: Diagnosis not present

## 2020-10-21 DIAGNOSIS — G8929 Other chronic pain: Secondary | ICD-10-CM

## 2020-10-21 DIAGNOSIS — M25661 Stiffness of right knee, not elsewhere classified: Secondary | ICD-10-CM | POA: Diagnosis not present

## 2020-10-21 DIAGNOSIS — M25561 Pain in right knee: Secondary | ICD-10-CM

## 2020-10-21 DIAGNOSIS — M6281 Muscle weakness (generalized): Secondary | ICD-10-CM

## 2020-10-21 NOTE — Therapy (Signed)
[facility] Physical Therapy [address] [place], [place], [address] Phone: [phone]   Fax:  [phone]  Physical Therapy Treatment  Patient Details  Name: Nancy Eaton MRN: [id number] Date of Birth: 09/19/1956 Referring Provider (PT): Magnus Ivan   Encounter Date: 10/21/2020   PT End of Session - 10/21/20 0803    Visit Number 24    Number of Visits 26    Date for PT Re-Evaluation 10/30/20    Progress Note Due on Visit 28    PT Start Time 0802    PT Stop Time 0855    PT Time Calculation (min) 53 min    Activity Tolerance Patient tolerated treatment well;Patient limited by pain    Behavior During Therapy [facility] for tasks assessed/performed           Past Medical History:  Diagnosis Date  . Arthritis    Knee  . Hyperlipidemia    Lovastatin started approx 2011  . IFG (impaired fasting glucose) 2018   HbA1c 5.9% (stable)  . PONV (postoperative nausea and vomiting)   . Pre-diabetes   . Right knee pain 12/2019   End stage: TKA recommended by ortho 01/2020->pt considering  . Sigmoid diverticulosis 04/2010   "     "     "    "    Past Surgical History:  Procedure Laterality Date  . BREAST ENHANCEMENT SURGERY     As of mammogram 12/2017--rupture of implants stable.  Marland Kitchen CARPAL TUNNEL RELEASE    . CESAREAN SECTION    . COLONOSCOPY  age 69   [facility]--recall 10 yrs  . TOTAL KNEE ARTHROPLASTY Right 07/17/2020   Procedure: RIGHT TOTAL KNEE ARTHROPLASTY;  Surgeon: Kathryne Hitch, MD;  Location: [facility];  Service: Orthopedics;  Laterality: Right;    There were no vitals filed for this visit.   Subjective Assessment - 10/21/20 0800    Subjective She is still working on her exercises.    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 No/denies              [facility] PT Assessment - 10/21/20 0001      Assessment   Medical Diagnosis R TKA    Referring  Provider (PT) Magnus Ivan      PROM   Right Knee Extension -2    Right Knee Flexion 98                         [facility] Adult PT Treatment/Exercise - 10/21/20 0802      Knee/Hip Exercises: Stretches   Passive Hamstring Stretch Right;20 seconds;3 reps    Passive Hamstring Stretch Limitations supine RLE ext SLR with strap DF    Knee: Self-Stretch to increase Flexion Right;3 reps;20 seconds    Knee: Self-Stretch Limitations lunge stretch foot in chair    Gastroc Stretch Both;30 seconds;2 reps    Gastroc Stretch Limitations off aerobic step heel depression      Knee/Hip Exercises: Aerobic   Stationary Bike Seat 8 for 8 minutes AROM, slow full revolutions 1 min, then level 2 for 7 minutes      Knee/Hip Exercises: Machines for Strengthening   Cybex Knee Extension RLE 10# 2 sets x 10reps 5sec hold in flexion bw reps & 60sec bw sets    Cybex Leg Press Shuttle leg press 112# BLE 5 sec hold max flex &  5 sec hold ext lifting LLE off plate 15 reps 2 sets 1st set back 45*  2nd set back flat & crossing LLE over RLE for rotational stabilization      Knee/Hip Exercises: Standing   Knee Flexion Strengthening;Right;20 reps    Knee Flexion Limitations stepping over 4" high step 16" long initiating knee flexion in terminal stance.      Forward Step Up Right;10 reps;Hand Hold: 1   ht 9" compliant surface   Step Down Right;1 set;10 reps;Hand Hold: 1   height 9" compliant surface   Functional Squat --    Functional Squat Limitations --    Rocker Board 1 minute   4 directions   Rocker Board Limitations round board: light UE support - ant/post,  right / left, circles CW & CCW    Other Standing Knee Exercises --      Knee/Hip Exercises: Supine   Heel Slides AAROM;Right;1 set;15 reps    Heel Slides Limitations foot on ball with strap to assist flexion     Knee Flexion AROM;Right;1 set;10 reps    Knee Flexion Limitations femur vertical AROM knee flexion 5 sec hold 15 reps      Vasopneumatic    Number Minutes Vasopneumatic  10 minutes    Vasopnuematic Location  Knee   intermittent TKE position   Vasopneumatic Pressure Medium    Vasopneumatic Temperature  34      Manual Therapy   Manual therapy comments Hypervolt to quads during stretch                  PT Education - 10/21/20 0957    Education Details risk of DVT with traveling and recommendation for ankle exercises while seated and walking hourly as able.    Person(s) Educated Patient    Methods Explanation;Verbal cues    Comprehension Verbalized understanding            PT Short Term Goals - 08/07/20 0843      PT SHORT TERM GOAL #1   Title Pt will be independent with HEP    Status Achieved             PT Long Term Goals - 09/30/20 1617      PT LONG TERM GOAL #1   Title Left Knee PROM ext -1* to flex 100*    Time 4    Period Weeks    Status Revised    Target Date 10/30/20      PT LONG TERM GOAL #2   Title left Knee AROM seated extension -5*    Time 4    Period Weeks    Status Revised    Target Date 10/30/20      PT LONG TERM GOAL #3   Title AROM knee flexion standing 75*    Time 4    Period Weeks    Status Revised    Target Date 10/30/20      PT LONG TERM GOAL #4   Title Patient able to ascend & descend ramps & curbs without device and stairs single rail alternating pattern modified independent.    Time 4    Period Weeks    Status Revised    Target Date 10/30/20      PT LONG TERM GOAL #5   Status On-going                 Plan - 10/21/20 0804    Clinical Impression Statement PT session focused on increasing range and  functional strength.  She is planning a trip via plane over weekend and verbalizes understanding of percautions to prevent DVT.    Examination-Activity Limitations Locomotion Level;Stand    Examination-Participation Restrictions Community Activity;Interpersonal Relationship    Stability/Clinical Decision Making Stable/Uncomplicated    Rehab Potential Good     PT Frequency 2x / week    PT Duration 4 weeks    PT Treatment/Interventions ADLs/Self Care Home Management;Cryotherapy;Electrical Stimulation;Gait training;Stair training;Functional mobility training;Therapeutic activities;Therapeutic exercise;Balance training;Neuromuscular re-education;Manual techniques;Patient/family education;Scar mobilization;Passive range of motion;Vasopneumatic Device    PT Next Visit Plan measure, continue with aggressive ROM & therapuetic exercise    PT Home Exercise Plan Tailgate knee flexion, self overpressure into extension and stationary bike,   Medbridge Access [email or web address]    Consulted and Agree with Plan of Care Patient           Patient will benefit from skilled therapeutic intervention in order to improve the following deficits and impairments:  Abnormal gait, Decreased range of motion, Difficulty walking, Decreased endurance, Pain, Impaired flexibility, Increased edema  Visit Diagnosis: Stiffness of right knee, not elsewhere classified  Difficulty walking  Localized edema  Chronic pain of right knee  Muscle weakness (generalized)  Acute pain of right knee     Problem List Patient Active Problem List   Diagnosis Date Noted  . Arthrofibrosis of total knee replacement (HCC) 08/26/2020  . Status post total right knee replacement 07/17/2020  . Unilateral primary osteoarthritis, right knee 12/30/2019  . Acute right-sided low back pain with right-sided sciatica 12/25/2017  . Health maintenance examination 09/30/2013  . Hyperlipidemia 09/30/2013    [name], PT, DPT 10/21/2020, 9:59 AM  [facility] Physical Therapy [address] [place], [place], [address] Phone: [phone]   Fax:  [phone]  Name: Nancy Eaton MRN: [id number] Date of Birth: Jul 26, 1956

## 2020-10-28 ENCOUNTER — Encounter: Payer: Self-pay | Admitting: Physical Therapy

## 2020-10-28 ENCOUNTER — Other Ambulatory Visit: Payer: Self-pay

## 2020-10-28 ENCOUNTER — Ambulatory Visit: Payer: Medicare Other | Admitting: Physical Therapy

## 2020-10-28 DIAGNOSIS — R262 Difficulty in walking, not elsewhere classified: Secondary | ICD-10-CM

## 2020-10-28 DIAGNOSIS — G8929 Other chronic pain: Secondary | ICD-10-CM

## 2020-10-28 DIAGNOSIS — M25661 Stiffness of right knee, not elsewhere classified: Secondary | ICD-10-CM | POA: Diagnosis not present

## 2020-10-28 DIAGNOSIS — M6281 Muscle weakness (generalized): Secondary | ICD-10-CM

## 2020-10-28 DIAGNOSIS — R600 Localized edema: Secondary | ICD-10-CM | POA: Diagnosis not present

## 2020-10-28 DIAGNOSIS — M25561 Pain in right knee: Secondary | ICD-10-CM

## 2020-10-28 NOTE — Therapy (Signed)
[facility] Physical Therapy [address] [place], [place], [address] Phone: [phone]   Fax:  [phone]  Physical Therapy Treatment  Patient Details  Name: Nancy Eaton MRN: [id number] Date of Birth: [birth year] Referring Provider (PT): Magnus Ivan   Encounter Date: 10/28/2020   PT End of Session - 10/28/20 0810    Visit Number 25    Number of Visits 26    Date for PT Re-Evaluation 10/30/20    Progress Note Due on Visit 28    PT Start Time 0806    PT Stop Time 0857    PT Time Calculation (min) 51 min    Activity Tolerance Patient tolerated treatment well    Behavior During Therapy [facility] for tasks assessed/performed           Past Medical History:  Diagnosis Date  . Arthritis    Knee  . Hyperlipidemia    Lovastatin started approx 2011  . IFG (impaired fasting glucose) 2018   HbA1c 5.9% (stable)  . PONV (postoperative nausea and vomiting)   . Pre-diabetes   . Right knee pain 12/2019   End stage: TKA recommended by ortho 01/2020->pt considering  . Sigmoid diverticulosis 04/2010   "     "     "    "    Past Surgical History:  Procedure Laterality Date  . BREAST ENHANCEMENT SURGERY     As of mammogram 12/2017--rupture of implants stable.  Marland Kitchen CARPAL TUNNEL RELEASE    . CESAREAN SECTION    . COLONOSCOPY  age 39   [facility]--recall 10 yrs  . TOTAL KNEE ARTHROPLASTY Right 07/17/2020   Procedure: RIGHT TOTAL KNEE ARTHROPLASTY;  Surgeon: Kathryne Hitch, MD;  Location: [facility];  Service: Orthopedics;  Laterality: Right;    There were no vitals filed for this visit.   Subjective Assessment - 10/28/20 0806    Subjective She traveled to [place] via plane without issues. She walked up to 15 minutes once without issues.    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 No/denies                             [facility] Adult PT Treatment/Exercise  - 10/28/20 0805      Self-Care   Self-Care Other Self-Care Comments    Other Self-Care Comments  instructed in use of Thera Cane. Pt verbalized & return demo understanding.       Knee/Hip Exercises: Stretches   Passive Hamstring Stretch Right;20 seconds;3 reps    Passive Hamstring Stretch Limitations supine RLE ext SLR with strap DF    Knee: Self-Stretch to increase Flexion Right;1 rep   3 minutes   Knee: Self-Stretch Limitations low load stretch seated with knee flexion    Gastroc Stretch --    Gastroc Stretch Limitations --    Other Knee/Hip Stretches quadriped  Child's pose stretch 20 sec hold 5 reps.        Knee/Hip Exercises: Aerobic   Stationary Bike --      Knee/Hip Exercises: Machines for Strengthening   Cybex Knee Extension --    Cybex Leg Press Shuttle leg press 112# BLE 5 sec hold max flex & 5 sec hold ext lifting LLE off plate 15 reps 2 sets 1st set back 45*  2nd set back flat & crossing LLE over RLE for rotational stabilization  Knee/Hip Exercises: Standing   Knee Flexion --    Knee Flexion Limitations --    Forward Step Up Right;15 reps;Hand Hold: 2   BOSU round side up   Forward Step Up Limitations cues for narrow step / normal step width in gait    Step Down Right;1 set;15 reps;Hand Hold: 2    BOSU round side up   Step Down Limitations cues for narrow step / normal step width in gait    Rocker Board 1 minute   4 directions   Rocker Board Limitations round board: light UE support - ant/post,  right / left, circles CW & CCW    Other Standing Knee Exercises BOSU round side up Y reaches 5 reps BUE support //bars      Knee/Hip Exercises: Supine   Heel Slides --    Heel Slides Limitations --    Knee Flexion --    Knee Flexion Limitations --      Knee/Hip Exercises: Prone   Hamstring Curl 2 sets;10 reps;5 seconds   5#, strap Barbaraann Boys Benjamin Stain for increased range   Hamstring Curl Limitations prone hang 2 min prior, bw rests & after last set    Prone Knee Hang 2  minutes   3 sets   Prone Knee Hang Weights (lbs) 5    Prone Knee Hang Limitations discussed adjustable cuff wt for HEP      Vasopneumatic   Number Minutes Vasopneumatic  10 minutes    Vasopnuematic Location  Knee   intermittent TKE position   Vasopneumatic Pressure High    Vasopneumatic Temperature  34      Manual Therapy   Manual therapy comments --                    PT Short Term Goals - 08/07/20 0843      PT SHORT TERM GOAL #1   Title Pt will be independent with HEP    Status Achieved             PT Long Term Goals - 09/30/20 1617      PT LONG TERM GOAL #1   Title Left Knee PROM ext -1* to flex 100*    Time 4    Period Weeks    Status Revised    Target Date 10/30/20      PT LONG TERM GOAL #2   Title left Knee AROM seated extension -5*    Time 4    Period Weeks    Status Revised    Target Date 10/30/20      PT LONG TERM GOAL #3   Title AROM knee flexion standing 75*    Time 4    Period Weeks    Status Revised    Target Date 10/30/20      PT LONG TERM GOAL #4   Title Patient able to ascend & descend ramps & curbs without device and stairs single rail alternating pattern modified independent.    Time 4    Period Weeks    Status Revised    Target Date 10/30/20      PT LONG TERM GOAL #5   Status On-going                 Plan - 10/28/20 0810    Clinical Impression Statement Patient feels PT has helped but would benefit from more PT.  She is improving PROM slowly. Her functional strength is improving.    Examination-Activity Limitations Locomotion Level;Stand  Examination-Participation Restrictions Community Activity;Interpersonal Relationship    Stability/Clinical Decision Making Stable/Uncomplicated    Rehab Potential Good    PT Frequency 2x / week    PT Duration 4 weeks    PT Treatment/Interventions ADLs/Self Care Home Management;Cryotherapy;Electrical Stimulation;Gait training;Stair training;Functional mobility  training;Therapeutic activities;Therapeutic exercise;Balance training;Neuromuscular re-education;Manual techniques;Patient/family education;Scar mobilization;Passive range of motion;Vasopneumatic Device    PT Next Visit Plan assess LTGs discharge vs recert?    PT Home Exercise Plan Prone hang & hamstring curls, quadraped stretch, Tailgate knee flexion, self overpressure into extension and stationary bike,   Medbridge Access [email or web address]    Consulted and Agree with Plan of Care Patient           Patient will benefit from skilled therapeutic intervention in order to improve the following deficits and impairments:  Abnormal gait, Decreased range of motion, Difficulty walking, Decreased endurance, Pain, Impaired flexibility, Increased edema  Visit Diagnosis: Stiffness of right knee, not elsewhere classified  Difficulty walking  Localized edema  Chronic pain of right knee  Muscle weakness (generalized)  Acute pain of right knee     Problem List Patient Active Problem List   Diagnosis Date Noted  . Arthrofibrosis of total knee replacement (HCC) 08/26/2020  . Status post total right knee replacement 07/17/2020  . Unilateral primary osteoarthritis, right knee 12/30/2019  . Acute right-sided low back pain with right-sided sciatica 12/25/2017  . Health maintenance examination 09/30/2013  . Hyperlipidemia 09/30/2013    [name] PT, DPT 10/28/2020, 8:59 AM  [facility] Physical Therapy [address] [place], [place], [address] Phone: [phone]   Fax:  [phone]  Name: Maciel Kegg MRN: [id number] Date of Birth: [birth year]

## 2020-10-29 ENCOUNTER — Ambulatory Visit: Payer: Medicare Other | Admitting: Physical Therapy

## 2020-10-29 ENCOUNTER — Encounter: Payer: Self-pay | Admitting: Physical Therapy

## 2020-10-29 DIAGNOSIS — M25561 Pain in right knee: Secondary | ICD-10-CM | POA: Diagnosis not present

## 2020-10-29 DIAGNOSIS — R262 Difficulty in walking, not elsewhere classified: Secondary | ICD-10-CM

## 2020-10-29 DIAGNOSIS — M6281 Muscle weakness (generalized): Secondary | ICD-10-CM

## 2020-10-29 DIAGNOSIS — M25661 Stiffness of right knee, not elsewhere classified: Secondary | ICD-10-CM

## 2020-10-29 DIAGNOSIS — R600 Localized edema: Secondary | ICD-10-CM | POA: Diagnosis not present

## 2020-10-29 DIAGNOSIS — G8929 Other chronic pain: Secondary | ICD-10-CM

## 2020-10-29 NOTE — Therapy (Signed)
[facility] Physical Therapy [address] [place], [place], [address] Phone: [phone]   Fax:  [phone]  Physical Therapy Treatment  Patient Details  Name: Nancy Eaton MRN: [id number] Date of Birth: Apr 15, 1956 Referring Provider (PT): Ninfa Linden   Encounter Date: 10/29/2020   PT End of Session - 10/29/20 0810    Visit Number 26    Number of Visits 34    Date for PT Re-Evaluation 10/30/20    Progress Note Due on Visit 28    PT Start Time 0805    PT Stop Time 0845    PT Time Calculation (min) 40 min    Activity Tolerance Patient tolerated treatment well    Behavior During Therapy [facility] for tasks assessed/performed           Past Medical History:  Diagnosis Date  . Arthritis    Knee  . Hyperlipidemia    Lovastatin started approx 2011  . IFG (impaired fasting glucose) 2018   HbA1c 5.9% (stable)  . PONV (postoperative nausea and vomiting)   . Pre-diabetes   . Right knee pain 12/2019   End stage: TKA recommended by ortho 01/2020->pt considering  . Sigmoid diverticulosis 04/2010   "     "     "    "    Past Surgical History:  Procedure Laterality Date  . BREAST ENHANCEMENT SURGERY     As of mammogram 12/2017--rupture of implants stable.  Marland Kitchen CARPAL TUNNEL RELEASE    . CESAREAN SECTION    . COLONOSCOPY  age 14   [facility]--recall 10 yrs  . TOTAL KNEE ARTHROPLASTY Right 07/17/2020   Procedure: RIGHT TOTAL KNEE ARTHROPLASTY;  Surgeon: Mcarthur Rossetti, MD;  Location: [facility];  Service: Orthopedics;  Laterality: Right;    There were no vitals filed for this visit.   Subjective Assessment - 10/29/20 0807    Subjective Her back has been bothering her since the trip. She used her new vibration machine and it helps. She feels PT has helped her knee but needs more PT to meet her goals.    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 Yes    Pain Score 6     range 1-10/10   Pain Location Back    Pain Orientation Lower;Mid    Pain Descriptors / Indicators Aching    Pain Type Chronic pain    Pain Frequency Intermittent    Aggravating Factors  standing longer time like work    Pain Relieving Factors stretches & vibration machine              [facility] PT Assessment - 10/29/20 0805      Assessment   Medical Diagnosis R TKA    Referring Provider (PT) Ninfa Linden      Observation/Other Assessments   Focus on Therapeutic Outcomes (FOTO)  [phone]   Initial was 38.2422     AROM   Right Knee Extension -9   seated LAQ   Right Knee Flexion 74   standing knee flexion antigravity     PROM   Right Knee Extension -1   supine overpressure, -3 comfortable positioning   Right Knee Flexion 100   supine after manual therapy     Ambulation/Gait   Gait velocity 3.62 ft/sec comfortable & 4.67 ft/sec fast      Standardized Balance Assessment   Standardized Balance Assessment Five Times Sit to  Stand;Timed Up and Go Test    Five times sit to stand comments  23.56 sec without UE support      Timed Up and Go Test   Normal TUG (seconds) 10.66   Initial was 22                        [facility] Adult PT Treatment/Exercise - 10/29/20 0805      Self-Care   Self-Care --    Other Self-Care Comments  --      Knee/Hip Exercises: Stretches   Passive Hamstring Stretch --    Passive Hamstring Stretch Limitations --    Knee: Self-Stretch to increase Flexion Right;1 rep   3 minutes   Knee: Self-Stretch Limitations low load stretch seated with knee flexion    Other Knee/Hip Stretches --      Knee/Hip Exercises: Machines for Strengthening   Cybex Leg Press --      Knee/Hip Exercises: Standing   Forward Step Up --    Forward Step Up Limitations --    Step Down --    Step Down Limitations --    Rocker Board --    Rocker Board Limitations --    Other Standing Knee Exercises --      Knee/Hip Exercises: Prone   Hamstring Curl --    Hamstring Curl  Limitations --    Prone Knee Hang --    Prone Knee Hang Weights (lbs) --    Prone Knee Hang Limitations --      Vasopneumatic   Number Minutes Vasopneumatic  --    Vasopnuematic Location  --    Vasopneumatic Pressure --    Vasopneumatic Temperature  --                    PT Short Term Goals - 08/07/20 0843      PT SHORT TERM GOAL #1   Title Pt will be independent with HEP    Status Achieved             PT Long Term Goals - 10/29/20 1212      PT LONG TERM GOAL #1   Title Left Knee PROM ext -1* to flex 100*    Baseline MET 12//07/2020    Time 4    Period Weeks    Status Achieved      PT LONG TERM GOAL #2   Title left Knee AROM seated extension -5*    Baseline 10/29/2020 left knee seated LAQ -9*    Time 4    Period Weeks    Status Not Met      PT LONG TERM GOAL #3   Title AROM knee flexion standing 75*    Baseline Partially MET 10/29/2020  standing knee flexion 74*    Time 4    Period Weeks    Status Partially Met      PT LONG TERM GOAL #4   Title Patient able to ascend & descend ramps & curbs without device and stairs single rail alternating pattern modified independent.    Time 4    Period Weeks    Status Achieved      PT LONG TERM GOAL #5   Status --              PT Long Term Goals - 10/29/20 1223      PT LONG TERM GOAL #1   Title Left Knee PROM ext 0* to flex 105*  Time 4    Period Weeks    Status On-going    Target Date 11/26/20      PT LONG TERM GOAL #2   Title left Knee AROM seated extension -5*    Time 4    Period Weeks    Status On-going    Target Date 11/26/20      PT LONG TERM GOAL #3   Title AROM knee flexion standing 80*    Time 4    Period Weeks    Status On-going    Target Date 11/26/20      PT LONG TERM GOAL #4   Title FOTO score >70% functional    Time 4    Period Weeks    Status New    Target Date 11/26/20               Plan - 10/29/20 4360    Clinical Impression Statement Patient met or  partially met 3 of 4 LTGs set at recertication.  She still has functional limitation in knee range & strength. She would benefit from 4 weeks of additional PT to maximize her functional return.    Examination-Activity Limitations Locomotion Level;Stand    Examination-Participation Restrictions Community Activity;Interpersonal Relationship    Stability/Clinical Decision Making Stable/Uncomplicated    Rehab Potential Good    PT Frequency 2x / week    PT Duration 4 weeks    PT Treatment/Interventions ADLs/Self Care Home Management;Cryotherapy;Electrical Stimulation;Gait training;Stair training;Functional mobility training;Therapeutic activities;Therapeutic exercise;Balance training;Neuromuscular re-education;Manual techniques;Patient/family education;Scar mobilization;Passive range of motion;Vasopneumatic Device    PT Next Visit Plan work towards updated LTGs, manual therapy, therapuetic exercise    PT Home Exercise Plan Prone hang & hamstring curls, quadraped stretch, Tailgate knee flexion, self overpressure into extension and stationary bike,   Medbridge Access [email or web address]    Consulted and Agree with Plan of Care Patient           Patient will benefit from skilled therapeutic intervention in order to improve the following deficits and impairments:  Abnormal gait,Decreased range of motion,Difficulty walking,Decreased endurance,Pain,Impaired flexibility,Increased edema  Visit Diagnosis: Stiffness of right knee, not elsewhere classified  Difficulty walking  Localized edema  Chronic pain of right knee  Muscle weakness (generalized)  Acute pain of right knee     Problem List Patient Active Problem List   Diagnosis Date Noted  . Arthrofibrosis of total knee replacement ([place]) 08/26/2020  . Status post total right knee replacement 07/17/2020  . Unilateral primary osteoarthritis, right knee 12/30/2019  . Acute right-sided low back pain with right-sided sciatica 12/25/2017  . Health  maintenance examination 09/30/2013  . Hyperlipidemia 09/30/2013    Jamey Reas PT, DPT 10/29/2020, 12:22 PM  [facility] Physical Therapy [address] [place], [place], [address] Phone: [phone]   Fax:  [phone]  Name: Dhanya Bogle MRN: [id number] Date of Birth: Sep 15, 1956

## 2020-10-30 ENCOUNTER — Encounter: Payer: Self-pay | Admitting: Physical Therapy

## 2020-11-03 ENCOUNTER — Encounter: Payer: Medicare Other | Admitting: Physical Therapy

## 2020-11-19 ENCOUNTER — Encounter: Payer: Self-pay | Admitting: Physical Therapy

## 2020-11-19 ENCOUNTER — Other Ambulatory Visit: Payer: Self-pay

## 2020-11-19 ENCOUNTER — Ambulatory Visit: Payer: Medicare Other | Admitting: Physical Therapy

## 2020-11-19 DIAGNOSIS — R262 Difficulty in walking, not elsewhere classified: Secondary | ICD-10-CM | POA: Diagnosis not present

## 2020-11-19 DIAGNOSIS — G8929 Other chronic pain: Secondary | ICD-10-CM

## 2020-11-19 DIAGNOSIS — R600 Localized edema: Secondary | ICD-10-CM

## 2020-11-19 DIAGNOSIS — M6281 Muscle weakness (generalized): Secondary | ICD-10-CM

## 2020-11-19 DIAGNOSIS — M25661 Stiffness of right knee, not elsewhere classified: Secondary | ICD-10-CM

## 2020-11-19 DIAGNOSIS — M25561 Pain in right knee: Secondary | ICD-10-CM

## 2020-11-19 NOTE — Therapy (Signed)
[facility] Physical Therapy [address] [place], [place], [address] Phone: [phone]   Fax:  [phone]  Physical Therapy Treatment  Patient Details  Name: Nancy Eaton MRN: [id number] Date of Birth: 08-30-1956 Referring Provider (PT): Nancy Eaton   Encounter Date: 11/19/2020   PT End of Session - 11/19/20 0815    Visit Number 27    Number of Visits 34    Date for PT Re-Evaluation 12/17/20   recert sent 12/9   Progress Note Due on Visit 28    PT Start Time 0805    PT Stop Time 0845    PT Time Calculation (min) 40 min    Activity Tolerance Patient tolerated treatment well    Behavior During Therapy [facility] for tasks assessed/performed           Past Medical History:  Diagnosis Date   Arthritis    Knee   Hyperlipidemia    Lovastatin started approx 2011   IFG (impaired fasting glucose) 2018   HbA1c 5.9% (stable)   PONV (postoperative nausea and vomiting)    Pre-diabetes    Right knee pain 12/2019   End stage: TKA recommended by ortho 01/2020->pt considering   Sigmoid diverticulosis 04/2010   "     "     "    "    Past Surgical History:  Procedure Laterality Date   BREAST ENHANCEMENT SURGERY     As of mammogram 12/2017--rupture of implants stable.   CARPAL TUNNEL RELEASE     CESAREAN SECTION     COLONOSCOPY  age 67   [facility]--recall 10 yrs   TOTAL KNEE ARTHROPLASTY Right 07/17/2020   Procedure: RIGHT TOTAL KNEE ARTHROPLASTY;  Surgeon: Nancy Hitch, MD;  Location: [facility];  Service: Orthopedics;  Laterality: Right;    There were no vitals filed for this visit.   Subjective Assessment - 11/19/20 0810    Subjective She relays about 1-2 pain in her knee and and overall less swelling as she has had some time off work. She is stil dealing with knee stiffness however.    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, be able to bend knee    Pain Onset More than a month ago               [facility] PT Assessment - 11/19/20 0001      Assessment   Medical Diagnosis R TKA    Referring Provider (PT) Nancy Eaton    Onset Date/Surgical Date 07/17/20      AROM   Right Knee Flexion 90      PROM   Right Knee Flexion 102                         [facility] Adult PT Treatment/Exercise - 11/19/20 0001      Knee/Hip Exercises: Stretches   Lobbyist Right;3 reps;30 seconds    Quad Stretch Limitations supine with strap    Knee: Self-Stretch Limitations seated knee felxion with self O.P from other leg 10 sec X 3 min      Knee/Hip Exercises: Aerobic   Recumbent Bike --    Nustep L5 8 min      Knee/Hip Exercises: Machines for Strengthening   Cybex Knee Extension RLE 10# 2 sets x 10reps 5sec hold in flexion bw reps & 60sec bw sets    Cybex Leg Press Shuttle leg press 112# BLE 5 sec hold max  flex & 5 sec hold ext lifting LLE off plate 15 reps 2 sets      Knee/Hip Exercises: Standing   Step Down Right;15 reps;Step Height: 6";Hand Hold: 2      Knee/Hip Exercises: Seated   Sit to Sand 2 sets;10 reps;without UE support      Manual Therapy   Manual therapy comments Rt knee PROM, flexion mobs                    PT Short Term Goals - 08/07/20 0843      PT SHORT TERM GOAL #1   Title Pt will be independent with HEP    Status Achieved             PT Long Term Goals - 10/29/20 1223      PT LONG TERM GOAL #1   Title Left Knee PROM ext 0* to flex 105*    Time 4    Period Weeks    Status On-going    Target Date 11/26/20      PT LONG TERM GOAL #2   Title left Knee AROM seated extension -5*    Time 4    Period Weeks    Status On-going    Target Date 11/26/20      PT LONG TERM GOAL #3   Title AROM knee flexion standing 80*    Time 4    Period Weeks    Status On-going    Target Date 11/26/20      PT LONG TERM GOAL #4   Title FOTO score >70% functional    Time 4    Period Weeks    Status New    Target Date 11/26/20                  Plan - 11/19/20 0817    Clinical Impression Statement Continued to foucus on ROM and strength as she still has functional deficits in this area and PT recommending about 3-4 weeks of PT to maximize this as much as possible. She is doing overall well with pain.    Examination-Activity Limitations Locomotion Level;Stand    Examination-Participation Restrictions Community Activity;Interpersonal Relationship    Stability/Clinical Decision Making Stable/Uncomplicated    Rehab Potential Good    PT Frequency 2x / week    PT Duration 4 weeks    PT Treatment/Interventions ADLs/Self Care Home Management;Cryotherapy;Electrical Stimulation;Gait training;Stair training;Functional mobility training;Therapeutic activities;Therapeutic exercise;Balance training;Neuromuscular re-education;Manual techniques;Patient/family education;Scar mobilization;Passive range of motion;Vasopneumatic Device    PT Next Visit Plan work towards updated LTGs, manual therapy, therapuetic exercise    PT Home Exercise Plan Prone hang & hamstring curls, quadraped stretch, Tailgate knee flexion, self overpressure into extension and stationary bike,   Medbridge Access [email or web address]    Consulted and Agree with Plan of Care Patient           Patient will benefit from skilled therapeutic intervention in order to improve the following deficits and impairments:  Abnormal gait,Decreased range of motion,Difficulty walking,Decreased endurance,Pain,Impaired flexibility,Increased edema  Visit Diagnosis: Stiffness of right knee, not elsewhere classified  Difficulty walking  Localized edema  Chronic pain of right knee  Muscle weakness (generalized)  Acute pain of right knee     Problem List Patient Active Problem List   Diagnosis Date Noted   Arthrofibrosis of total knee replacement (HCC) 08/26/2020   Status post total right knee replacement 07/17/2020   Unilateral primary osteoarthritis, right knee 12/30/2019    Acute right-sided low back pain  with right-sided sciatica 12/25/2017   Health maintenance examination 09/30/2013   Hyperlipidemia 09/30/2013    Nancy Eaton 11/19/2020, 8:43 AM  [facility] Physical Therapy [address] [place], [place], [address] Phone: [phone]   Fax:  [phone]  Name: Nancy Eaton MRN: [id number] Date of Birth: Jul 06, 1956

## 2020-11-24 ENCOUNTER — Encounter: Payer: Self-pay | Admitting: Physical Therapy

## 2020-11-24 ENCOUNTER — Other Ambulatory Visit: Payer: Self-pay

## 2020-11-24 ENCOUNTER — Ambulatory Visit: Payer: Medicare Other | Admitting: Physical Therapy

## 2020-11-24 DIAGNOSIS — R600 Localized edema: Secondary | ICD-10-CM

## 2020-11-24 DIAGNOSIS — M25561 Pain in right knee: Secondary | ICD-10-CM | POA: Diagnosis not present

## 2020-11-24 DIAGNOSIS — M6281 Muscle weakness (generalized): Secondary | ICD-10-CM

## 2020-11-24 DIAGNOSIS — R262 Difficulty in walking, not elsewhere classified: Secondary | ICD-10-CM | POA: Diagnosis not present

## 2020-11-24 DIAGNOSIS — G8929 Other chronic pain: Secondary | ICD-10-CM

## 2020-11-24 DIAGNOSIS — M25661 Stiffness of right knee, not elsewhere classified: Secondary | ICD-10-CM

## 2020-11-24 NOTE — Therapy (Signed)
[facility] Physical Therapy [address] [place], [place], [address] Phone: [phone]   Fax:  [phone]  Physical Therapy Treatment  Patient Details  Name: Nancy Eaton MRN: [id number] Date of Birth: 1956-11-10 Referring Provider (PT): Magnus Ivan   Encounter Date: 11/24/2020   PT End of Session - 11/24/20 0810    Visit Number 28    Number of Visits 34    Date for PT Re-Evaluation 12/17/20   recert sent 12/9   Progress Note Due on Visit 28    PT Start Time 0806    PT Stop Time 0845    PT Time Calculation (min) 39 min    Activity Tolerance Patient tolerated treatment well    Behavior During Therapy [facility] for tasks assessed/performed           Past Medical History:  Diagnosis Date  . Arthritis    Knee  . Hyperlipidemia    Lovastatin started approx 2011  . IFG (impaired fasting glucose) 2018   HbA1c 5.9% (stable)  . PONV (postoperative nausea and vomiting)   . Pre-diabetes   . Right knee pain 12/2019   End stage: TKA recommended by ortho 01/2020->pt considering  . Sigmoid diverticulosis 04/2010   "     "     "    "    Past Surgical History:  Procedure Laterality Date  . BREAST ENHANCEMENT SURGERY     As of mammogram 12/2017--rupture of implants stable.  Marland Kitchen CARPAL TUNNEL RELEASE    . CESAREAN SECTION    . COLONOSCOPY  age 38   [facility]--recall 10 yrs  . TOTAL KNEE ARTHROPLASTY Right 07/17/2020   Procedure: RIGHT TOTAL KNEE ARTHROPLASTY;  Surgeon: Kathryne Hitch, MD;  Location: [facility];  Service: Orthopedics;  Laterality: Right;    There were no vitals filed for this visit.   Subjective Assessment - 11/24/20 0806    Subjective She has been doing her stretches. She has not been able to go to [employer].    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 No/denies    Pain Onset More than a month ago                              [facility] Adult PT Treatment/Exercise - 11/24/20 0806      Knee/Hip Exercises: Stretches   Knee: Self-Stretch to increase Flexion Right;1 rep   3 minutes   Knee: Self-Stretch Limitations low load stretch seated with knee flexion    Other Knee/Hip Stretches half kneeling using 2 chair bottoms for support & pillow under kneeling knee: 10 reps with ea RLE in back & 10reps front position.      Knee/Hip Exercises: Machines for Strengthening   Cybex Leg Press Shuttle leg press 112# BLE 5 sec hold max flex & 5 sec hold ext lifting LLE off plate 15 reps 2 sets      Knee/Hip Exercises: Standing   Other Standing Knee Exercises BOSU round side up stance on RLE with left hip flexion 15 reps & Y reaches 5 reps intermittent BUE support //bars    Other Standing Knee Exercises BOSU flat side up: squats 15 reps, wt shifts - ant/post, rt/lt & circles CW/CCW 5 reps ea.  intermittent touch // bars      Knee/Hip Exercises: Prone   Other Prone Exercises quadraped  with knee flexion stretch 10 sec 10 reps.                     PT Short Term Goals - 08/07/20 0843      PT SHORT TERM GOAL #1   Title Pt will be independent with HEP    Status Achieved             PT Long Term Goals - 10/29/20 1223      PT LONG TERM GOAL #1   Title Left Knee PROM ext 0* to flex 105*    Time 4    Period Weeks    Status On-going    Target Date 11/26/20      PT LONG TERM GOAL #2   Title left Knee AROM seated extension -5*    Time 4    Period Weeks    Status On-going    Target Date 11/26/20      PT LONG TERM GOAL #3   Title AROM knee flexion standing 80*    Time 4    Period Weeks    Status On-going    Target Date 11/26/20      PT LONG TERM GOAL #4   Title FOTO score >70% functional    Time 4    Period Weeks    Status New    Target Date 11/26/20                 Plan - 11/24/20 0810    Clinical Impression Statement PT discussed need for well rounded exercise program & necessity with arthritic  history.  She appears to understand. PT introduced some new exercise options for home & gym which she appears to understand.    Examination-Activity Limitations Locomotion Level;Stand    Examination-Participation Restrictions Community Activity;Interpersonal Relationship    Stability/Clinical Decision Making Stable/Uncomplicated    Rehab Potential Good    PT Frequency 2x / week    PT Duration 4 weeks    PT Treatment/Interventions ADLs/Self Care Home Management;Cryotherapy;Electrical Stimulation;Gait training;Stair training;Functional mobility training;Therapeutic activities;Therapeutic exercise;Balance training;Neuromuscular re-education;Manual techniques;Patient/family education;Scar mobilization;Passive range of motion;Vasopneumatic Device    PT Next Visit Plan check LTGs & discharge    PT Home Exercise Plan Prone hang & hamstring curls, quadraped stretch, Tailgate knee flexion, self overpressure into extension and stationary bike,   Medbridge Access [email or web address]    Consulted and Agree with Plan of Care Patient           Patient will benefit from skilled therapeutic intervention in order to improve the following deficits and impairments:  Abnormal gait,Decreased range of motion,Difficulty walking,Decreased endurance,Pain,Impaired flexibility,Increased edema  Visit Diagnosis: Difficulty walking  Stiffness of right knee, not elsewhere classified  Localized edema  Chronic pain of right knee  Muscle weakness (generalized)  Acute pain of right knee     Problem List Patient Active Problem List   Diagnosis Date Noted  . Arthrofibrosis of total knee replacement (HCC) 08/26/2020  . Status post total right knee replacement 07/17/2020  . Unilateral primary osteoarthritis, right knee 12/30/2019  . Acute right-sided low back pain with right-sided sciatica 12/25/2017  . Health maintenance examination 09/30/2013  . Hyperlipidemia 09/30/2013    [name] PT, DPT 11/24/2020, 8:50  AM  [facility] Physical Therapy [address] [place], [place], [address] Phone: [phone]   Fax:  [phone]  Name: Nancy Eaton MRN: [id number] Date of Birth: Oct 11, 1956

## 2020-11-26 ENCOUNTER — Ambulatory Visit: Payer: Medicare Other | Admitting: Physical Therapy

## 2020-11-26 ENCOUNTER — Other Ambulatory Visit: Payer: Self-pay

## 2020-11-26 ENCOUNTER — Encounter: Payer: Self-pay | Admitting: Physical Therapy

## 2020-11-26 DIAGNOSIS — R600 Localized edema: Secondary | ICD-10-CM | POA: Diagnosis not present

## 2020-11-26 DIAGNOSIS — G8929 Other chronic pain: Secondary | ICD-10-CM

## 2020-11-26 DIAGNOSIS — M25561 Pain in right knee: Secondary | ICD-10-CM | POA: Diagnosis not present

## 2020-11-26 DIAGNOSIS — M25661 Stiffness of right knee, not elsewhere classified: Secondary | ICD-10-CM

## 2020-11-26 DIAGNOSIS — R262 Difficulty in walking, not elsewhere classified: Secondary | ICD-10-CM | POA: Diagnosis not present

## 2020-11-26 DIAGNOSIS — M6281 Muscle weakness (generalized): Secondary | ICD-10-CM

## 2020-11-26 NOTE — Therapy (Signed)
[facility] Physical Therapy [address] [place], [place], [address] Phone: [phone]   Fax:  [phone]  Physical Therapy Treatment & Discharge Summary  Patient Details  Name: Nancy Eaton MRN: [id number] Date of Birth: 1956/06/19 Referring Provider (PT): Ninfa Linden   Encounter Date: 11/26/2020   PHYSICAL THERAPY DISCHARGE SUMMARY  Visits from Start of Care: 29  Current functional level related to goals / functional outcomes:  PT Long Term Goals - 11/26/20 0806      PT LONG TERM GOAL #1   Title Left Knee PROM ext 0* to flex 105*    Baseline MET 11/26/2020    Time 4    Period Weeks    Status Achieved      PT LONG TERM GOAL #2   Title left Knee AROM seated extension -5*    Baseline MET 11/26/2020    Time 4    Period Weeks    Status Achieved      PT LONG TERM GOAL #3   Title AROM knee flexion standing 80*    Baseline MET 11/26/2020    Time 4    Period Weeks    Status Achieved      PT LONG TERM GOAL #4   Title FOTO score >70% functional    Baseline Partially MET 11/26/2020    Time 4    Period Weeks    Status Partially Met             Remaining deficits:  [facility] PT Assessment - 11/26/20 0804      Assessment   Medical Diagnosis R TKA    Referring Provider (PT) Ninfa Linden      Observation/Other Assessments   Focus on Therapeutic Outcomes (FOTO)  67.2758   was 68.06 on 10/29/2020     Functional Tests   Functional tests Single leg stance      Single Leg Stance   Comments RLE 22.22 sec      AROM   Right Knee Extension -5   seated LAQ   Right Knee Flexion 83   standing     PROM   Right Knee Extension 0   supine   Right Knee Flexion 113   seated after manual therapy            Education / Equipment: HEP & ongoing fitness plan  Plan: Patient agrees to discharge.  Patient goals were met. Patient is being discharged due to meeting the stated rehab goals.  ?????           PT End of Session - 11/26/20 0806    Visit Number  29    Number of Visits 34    Date for PT Re-Evaluation 27/06/23   recert sent 76/2   Progress Note Due on Visit --    PT Start Time 0804    PT Stop Time 0832    PT Time Calculation (min) 28 min    Activity Tolerance Patient tolerated treatment well    Behavior During Therapy [facility] for tasks assessed/performed           Past Medical History:  Diagnosis Date  . Arthritis    Knee  . Hyperlipidemia    Lovastatin started approx 2011  . IFG (impaired fasting glucose) 2018   HbA1c 5.9% (stable)  . PONV (postoperative nausea and vomiting)   . Pre-diabetes   . Right knee pain 12/2019   End stage: TKA recommended by ortho 01/2020->pt considering  . Sigmoid diverticulosis 04/2010   "     "     "    "  Past Surgical History:  Procedure Laterality Date  . BREAST ENHANCEMENT SURGERY     As of mammogram 12/2017--rupture of implants stable.  Marland Kitchen CARPAL TUNNEL RELEASE    . CESAREAN SECTION    . COLONOSCOPY  age 25   [facility]--recall 10 yrs  . TOTAL KNEE ARTHROPLASTY Right 07/17/2020   Procedure: RIGHT TOTAL KNEE ARTHROPLASTY;  Surgeon: Mcarthur Rossetti, MD;  Location: [facility];  Service: Orthopedics;  Laterality: Right;    There were no vitals filed for this visit.   Subjective Assessment - 11/26/20 0804    Subjective She is working without any limitations. She has been doing her exercises.    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 No/denies    Pain Onset More than a month ago              [facility] PT Assessment - 11/26/20 0804      Assessment   Medical Diagnosis R TKA    Referring Provider (PT) Ninfa Linden      Observation/Other Assessments   Focus on Therapeutic Outcomes (FOTO)  67.2758   was 68.06 on 10/29/2020     Functional Tests   Functional tests Single leg stance      Single Leg Stance   Comments RLE 22.22 sec      AROM   Right Knee Extension -5   seated LAQ   Right Knee  Flexion 83   standing     PROM   Right Knee Extension 0   supine   Right Knee Flexion 113   seated after manual therapy                        [facility] Adult PT Treatment/Exercise - 11/26/20 0804      Knee/Hip Exercises: Aerobic   Recumbent Bike seat 8 for 8 min      Knee/Hip Exercises: Seated   Heel Slides AROM;Right;10 reps    Heel Slides Limitations PT reviewed technique to get max knee flexion. After 10 reps, hold low grade stretch for 5 minutes.  Pt verbalized understanding of recommendation to continue 2x/day for max range in knee.      Knee/Hip Exercises: Supine   Knee Extension AROM;Right;10 reps;5 sets    Knee Extension Limitations ankle propped, position for 5 min with 10 reps of quad sets ea minute. hold low grade stretch for 5 minutes.  Pt verbalized understanding of recommendation to continue 2x/day for max range in knee.                    PT Short Term Goals - 08/07/20 0843      PT SHORT TERM GOAL #1   Title Pt will be independent with HEP    Status Achieved             PT Long Term Goals - 11/26/20 0806      PT LONG TERM GOAL #1   Title Left Knee PROM ext 0* to flex 105*    Baseline MET 11/26/2020    Time 4    Period Weeks    Status Achieved      PT LONG TERM GOAL #2   Title left Knee AROM seated extension -5*    Baseline MET 11/26/2020    Time 4    Period Weeks    Status Achieved  PT LONG TERM GOAL #3   Title AROM knee flexion standing 80*    Baseline MET 11/26/2020    Time 4    Period Weeks    Status Achieved      PT LONG TERM GOAL #4   Title FOTO score >70% functional    Baseline Partially MET 11/26/2020    Time 4    Period Weeks    Status Partially Met                 Plan - 11/26/20 0806    Clinical Impression Statement Patient met or partially met all LTGs. She has returned to work without issues & is traveling which includes a great deal of walking without issues.    Examination-Activity Limitations  Locomotion Level;Stand    Examination-Participation Restrictions Community Activity;Interpersonal Relationship    Stability/Clinical Decision Making Stable/Uncomplicated    Rehab Potential Good    PT Frequency 2x / week    PT Duration 4 weeks    PT Treatment/Interventions ADLs/Self Care Home Management;Cryotherapy;Electrical Stimulation;Gait training;Stair training;Functional mobility training;Therapeutic activities;Therapeutic exercise;Balance training;Neuromuscular re-education;Manual techniques;Patient/family education;Scar mobilization;Passive range of motion;Vasopneumatic Device    PT Next Visit Plan discharge    PT Home Exercise Plan Prone hang & hamstring curls, quadraped stretch, Tailgate knee flexion, self overpressure into extension and stationary bike,   Medbridge Access [email or web address]    Consulted and Agree with Plan of Care Patient           Patient will benefit from skilled therapeutic intervention in order to improve the following deficits and impairments:  Abnormal gait,Decreased range of motion,Difficulty walking,Decreased endurance,Pain,Impaired flexibility,Increased edema  Visit Diagnosis: Difficulty walking  Stiffness of right knee, not elsewhere classified  Localized edema  Chronic pain of right knee  Muscle weakness (generalized)  Acute pain of right knee     Problem List Patient Active Problem List   Diagnosis Date Noted  . Arthrofibrosis of total knee replacement ([place]) 08/26/2020  . Status post total right knee replacement 07/17/2020  . Unilateral primary osteoarthritis, right knee 12/30/2019  . Acute right-sided low back pain with right-sided sciatica 12/25/2017  . Health maintenance examination 09/30/2013  . Hyperlipidemia 09/30/2013    Jamey Reas, PT, DPT 11/26/2020, 8:41 AM  [facility] Physical Therapy [address] [place], [place], [address] Phone: [phone]   Fax:  [phone]  Name: Nancy Eaton MRN:  [id number] Date of Birth: 1956-01-29

## 2021-02-03 ENCOUNTER — Other Ambulatory Visit: Payer: Self-pay

## 2021-02-03 ENCOUNTER — Encounter: Payer: Self-pay | Admitting: Physician Assistant

## 2021-02-03 ENCOUNTER — Ambulatory Visit: Payer: Medicare Other | Admitting: Physician Assistant

## 2021-02-03 ENCOUNTER — Ambulatory Visit: Payer: Self-pay

## 2021-02-03 DIAGNOSIS — Z96651 Presence of right artificial knee joint: Secondary | ICD-10-CM | POA: Diagnosis not present

## 2021-02-03 NOTE — Progress Notes (Signed)
Office Visit Note   Patient: Nancy Eaton           Date of Birth: Oct 13, 1956           MRN: [id number] Visit Date: 02/03/2021              Requested by: Nancy El, PA-C [address] [place],  [place] [address] PCP: Nancy Eaton, Nancy Reader, PA-C   Assessment & Plan: Visit Diagnoses:  1. Status post total right knee replacement     Plan: She will continue to work on range of motion strengthening the knee.  Follow-up with [place] in a year postop at that point time we will obtain AP and lateral views of the right knee.  Questions were encouraged and answered at length today.  Follow-Up Instructions: No follow-ups on file.   Orders:  Orders Placed This Encounter  Procedures  . XR Knee 1-2 Views Right   No orders of the defined types were placed in this encounter.     Procedures: No procedures performed   Clinical Data: No additional findings.   Subjective: Chief Complaint  Patient presents with  . Right Knee - Routine Post Op    HPI Nancy Eaton returns today status post right total knee arthroplasty 07/17/2020.  She is overall improving.  She still working on range of motion.  Has some achiness pain some swelling.   She has no complaints. Review of Systems  Constitutional: Negative for diaphoresis and fever.     Objective: Vital Signs: There were no vitals taken for this visit.  Physical Exam Constitutional:      Appearance: She is not ill-appearing or diaphoretic.  Pulmonary:     Effort: Pulmonary effort is normal.  Neurological:     Mental Status: She is alert and oriented to person, place, and time.  Psychiatric:        Mood and Affect: Mood normal.     Ortho Exam Right knee full extension flexion to 107 degrees.  No instability valgus varus stressing.  Surgical incisions well-healed.  Calf supple nontender.  Specialty Comments:  No specialty comments available.  Imaging: XR Knee 1-2 Views Right  Result Date: 02/03/2021 Right knee AP  lateral views: Right knee is well located.  Status post right total knee replacement with well-seated components.  No acute fractures or acute findings.  No bony abnormalities.    PMFS History: Patient Active Problem List   Diagnosis Date Noted  . Arthrofibrosis of total knee replacement (HCC) 08/26/2020  . Status post total right knee replacement 07/17/2020  . Unilateral primary osteoarthritis, right knee 12/30/2019  . Acute right-sided low back pain with right-sided sciatica 12/25/2017  . Health maintenance examination 09/30/2013  . Hyperlipidemia 09/30/2013   Past Medical History:  Diagnosis Date  . Arthritis    Knee  . Hyperlipidemia    Lovastatin started approx 2011  . IFG (impaired fasting glucose) 2018   HbA1c 5.9% (stable)  . PONV (postoperative nausea and vomiting)   . Pre-diabetes   . Right knee pain 12/2019   End stage: TKA recommended by ortho 01/2020->pt considering  . Sigmoid diverticulosis 04/2010   "     "     "    "    Family History  Problem Relation Age of Onset  . Cancer Mother        Brain  . Alcohol abuse Father   . COPD Brother   . ADD / ADHD Son   . Anxiety  disorder Son   . Heart disease Brother     Past Surgical History:  Procedure Laterality Date  . BREAST ENHANCEMENT SURGERY     As of mammogram 12/2017--rupture of implants stable.  Marland Kitchen CARPAL TUNNEL RELEASE    . CESAREAN SECTION    . COLONOSCOPY  age 50   [facility]--recall 10 yrs  . TOTAL KNEE ARTHROPLASTY Right 07/17/2020   Procedure: RIGHT TOTAL KNEE ARTHROPLASTY;  Surgeon: Nancy Hitch, MD;  Location: [facility];  Service: Orthopedics;  Laterality: Right;   Social History   Occupational History  . Not on file  Tobacco Use  . Smoking status: Never Smoker  . Smokeless tobacco: Never Used  Vaping Use  . Vaping Use: Never used  Substance and Sexual Activity  . Alcohol use: Yes    Comment: socially  . Drug use: No  . Sexual activity: Not on file

## 2021-02-22 ENCOUNTER — Other Ambulatory Visit: Payer: Self-pay | Admitting: Orthopaedic Surgery

## 2021-02-22 DIAGNOSIS — M1711 Unilateral primary osteoarthritis, right knee: Secondary | ICD-10-CM

## 2021-02-22 DIAGNOSIS — G8929 Other chronic pain: Secondary | ICD-10-CM

## 2021-03-29 ENCOUNTER — Telehealth: Payer: Self-pay | Admitting: Orthopaedic Surgery

## 2021-03-29 NOTE — Telephone Encounter (Signed)
Faxed to provided number  

## 2021-03-29 NOTE — Telephone Encounter (Signed)
Myra from [employer] is wondering if you could fax over wether or not pt needs medication before any cleanings/ procedures. Fax # [phone]

## 2021-05-25 ENCOUNTER — Other Ambulatory Visit: Payer: Self-pay

## 2021-05-25 ENCOUNTER — Ambulatory Visit: Payer: Medicare Other | Admitting: Orthopaedic Surgery

## 2021-05-25 ENCOUNTER — Ambulatory Visit: Payer: Self-pay

## 2021-05-25 ENCOUNTER — Encounter: Payer: Self-pay | Admitting: Orthopaedic Surgery

## 2021-05-25 ENCOUNTER — Telehealth: Payer: Self-pay | Admitting: Physician Assistant

## 2021-05-25 VITALS — Ht 62.5 in | Wt 185.0 lb

## 2021-05-25 DIAGNOSIS — M5412 Radiculopathy, cervical region: Secondary | ICD-10-CM | POA: Diagnosis not present

## 2021-05-25 MED ORDER — METHYLPREDNISOLONE 4 MG PO TABS: ORAL_TABLET | ORAL | 0 refills | Status: DC

## 2021-05-25 NOTE — Telephone Encounter (Signed)
Pt called stating she had an appt this morning and wanted a CB so Bronson Curb can explain how he wants her to take her prednisone rx. Pt states the pharmacist mentioned that sometimes the Drs start to wean the pt off their rxs but it may be different for prednisone. So pt is just wanting to make sure she's taking it correctly and she did state she's already taken one.   [phone]

## 2021-05-25 NOTE — Telephone Encounter (Signed)
Called and advised pt of instructions

## 2021-05-25 NOTE — Addendum Note (Signed)
Addended by: Barbette Or on: 05/25/2021 12:43 PM   Modules accepted: Orders

## 2021-05-25 NOTE — Progress Notes (Signed)
Office Visit Note   Patient: Nancy Eaton           Date of Birth: Jul 14, 1956           MRN: [id number] Visit Date: 05/25/2021              Requested by: Brayton El, PA-C [address] [place],  [place] [address] PCP: Drosinis, Leonia Reader, PA-C   Assessment & Plan: Visit Diagnoses:  1. Cervical radicular pain     Plan: We will send her to formal physical therapy to work on range of motion of her neck, strengthening, home exercise program and include modalities.  She will follow-up with [place] in 4 weeks to see what type of response she had to this and the Medrol Dosepak.  She does have her Robaxin at home and can continue to take this if she needs a refill.  She can always call requesting a refill.  While on the Medrol Dosepak she is to take no NSAIDs.  Discussed this with the patient and the fact that she can take Tylenol if needed.  Questions were encouraged and answered at length.  Follow-Up Instructions: Return in about 4 weeks (around 06/22/2021).   Orders:  Orders Placed This Encounter  Procedures   XR Cervical Spine 2 or 3 views   Meds ordered this encounter  Medications   methylPREDNISolone (MEDROL) 4 MG tablet    Sig: Take as directed    Dispense:  21 tablet    Refill:  0      Procedures: No procedures performed   Clinical Data: No additional findings.   Subjective: Chief Complaint  Patient presents with   Left Shoulder - Pain    HPI Nancy Eaton is well-known to our department service comes in today with 2 weeks of pain in her left scapular with numbness tingling that radiates down into her left hand following the ulnar distribution.  She has had no known injury.  She does note if she raises her hands above her head alleviates the pain.  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  She is been taking ibuprofen Tylenol and this does seem to help  ease the pain some.  She has had no prior injury to the neck or shoulder.  Patient states she is nondiabetic, she does have a history of prediabetes.  Review of Systems See HPI   Objective: Vital Signs: Ht 5' 2.5" (1.588 m)   Wt 185 lb (83.9 kg)   BMI 33.30 kg/m   Physical Exam Constitutional:      Appearance: She is not ill-appearing or diaphoretic.  Pulmonary:     Effort: Pulmonary effort is normal.  Neurological:     Mental Status: She is alert and oriented to person, place, and time.  Psychiatric:        Mood and Affect: Mood normal.    Ortho Exam Cervical spine: Good range of motion cervical spine in flexion extension may cause some discomfort.  Spurling's is negative bilaterally.  She has tenderness along medial border of the left scapula. Bilateral shoulders 5 out of 5 strength with external and internal rotation against resistance empty can test is negative bilaterally.  Impingement testing negative bilaterally. Bilateral hands radial pulses are 2+ bilaterally symmetric.  Sensation subjectively diminished over the ulnar distribution of the left hand only.  Full motor bilateral hands.  Specialty Comments:  No specialty comments available.  Imaging: XR Cervical Spine 2 or 3 views  Result Date: 05/25/2021 2 views cervical spine: No acute fractures.  Loss of lordotic curvature.  C4-C7 anterior endplate osteophytes consistent with degenerative disc disease.  No spondylolisthesis.    PMFS History: Patient Active Problem List   Diagnosis Date Noted   Arthrofibrosis of total knee replacement (HCC) 08/26/2020   Status post total right knee replacement 07/17/2020   Unilateral primary osteoarthritis, right knee 12/30/2019   Acute right-sided low back pain with right-sided sciatica 12/25/2017   Health maintenance examination 09/30/2013   Hyperlipidemia 09/30/2013   Past Medical History:  Diagnosis Date   Arthritis    Knee   Hyperlipidemia    Lovastatin started approx  2011   IFG (impaired fasting glucose) 2018   HbA1c 5.9% (stable)   PONV (postoperative nausea and vomiting)    Pre-diabetes    Right knee pain 12/2019   End stage: TKA recommended by ortho 01/2020->pt considering   Sigmoid diverticulosis 04/2010   "     "     "    "    Family History  Problem Relation Age of Onset   Cancer Mother        Brain   Alcohol abuse Father    COPD Brother    ADD / ADHD Son    Anxiety disorder Son    Heart disease Brother     Past Surgical History:  Procedure Laterality Date   BREAST ENHANCEMENT SURGERY     As of mammogram 12/2017--rupture of implants stable.   CARPAL TUNNEL RELEASE     CESAREAN SECTION     COLONOSCOPY  age 64   [facility]--recall 10 yrs   TOTAL KNEE ARTHROPLASTY Right 07/17/2020   Procedure: RIGHT TOTAL KNEE ARTHROPLASTY;  Surgeon: Kathryne Hitch, MD;  Location: [facility];  Service: Orthopedics;  Laterality: Right;   Social History   Occupational History   Not on file  Tobacco Use   Smoking status: Never   Smokeless tobacco: Never  Vaping Use   Vaping Use: Never used  Substance and Sexual Activity   Alcohol use: Yes    Comment: socially   Drug use: No   Sexual activity: Not on file

## 2021-06-09 ENCOUNTER — Encounter: Payer: Self-pay | Admitting: Rehabilitative and Restorative Service Providers"

## 2021-06-09 ENCOUNTER — Ambulatory Visit: Payer: Medicare Other | Admitting: Rehabilitative and Restorative Service Providers"

## 2021-06-09 DIAGNOSIS — M6281 Muscle weakness (generalized): Secondary | ICD-10-CM | POA: Diagnosis not present

## 2021-06-09 DIAGNOSIS — M542 Cervicalgia: Secondary | ICD-10-CM | POA: Diagnosis not present

## 2021-06-09 DIAGNOSIS — R293 Abnormal posture: Secondary | ICD-10-CM

## 2021-06-09 DIAGNOSIS — M79602 Pain in left arm: Secondary | ICD-10-CM

## 2021-06-09 NOTE — Therapy (Signed)
[facility] Physical Therapy [address] [place], [place], [address] Phone: [phone]   Fax:  [phone]  Physical Therapy Evaluation  Patient Details  Name: Nancy Eaton MRN: [id number] Date of Birth: Apr 08, 1956 Referring Provider (PT): Richardean Canal PA-C   Encounter Date: 06/09/2021   PT End of Session - 06/09/21 0848     Visit Number 1    Number of Visits 20    Date for PT Re-Evaluation 08/18/21    Authorization Type UHC Medicare    Progress Note Due on Visit 10    PT Start Time 0848    PT Stop Time 0924    PT Time Calculation (min) 36 min    Activity Tolerance Patient tolerated treatment well    Behavior During Therapy [facility] for tasks assessed/performed             Past Medical History:  Diagnosis Date   Arthritis    Knee   Hyperlipidemia    Lovastatin started approx 2011   IFG (impaired fasting glucose) 2018   HbA1c 5.9% (stable)   PONV (postoperative nausea and vomiting)    Pre-diabetes    Right knee pain 12/2019   End stage: TKA recommended by ortho 01/2020->pt considering   Sigmoid diverticulosis 04/2010   "     "     "    "    Past Surgical History:  Procedure Laterality Date   BREAST ENHANCEMENT SURGERY     As of mammogram 12/2017--rupture of implants stable.   CARPAL TUNNEL RELEASE     CESAREAN SECTION     COLONOSCOPY  age 13   [facility]--recall 10 yrs   TOTAL KNEE ARTHROPLASTY Right 07/17/2020   Procedure: RIGHT TOTAL KNEE ARTHROPLASTY;  Surgeon: Kathryne Hitch, MD;  Location: [facility];  Service: Orthopedics;  Laterality: Right;    There were no vitals filed for this visit.    Subjective Assessment - 06/09/21 0856     Subjective Pt. indicated waking up one morning and felt complaints start about 2 weeks ago.  Pt. indicated having complaints in Lt side including into Lt arm with tingling noted into arm/hand.  Initially had trouble sleeping at first but has improved some.  Medicine and some neck exercises have  seemed to help some.  Work as Interior and spatial designer.  Pt. is Rt hand dominant    Limitations House hold activities;Other (comment)   work   Patient Stated Goals Reduce pain    Currently in Pain? Yes    Pain Score 0-No pain   at worst in last week 3/10   Pain Location Neck    Pain Orientation Left    Pain Descriptors / Indicators Tingling;Other (Comment)   pulling   Pain Type Acute pain    Pain Radiating Towards Lt arm    Pain Onset 1 to 4 weeks ago    Pain Frequency Intermittent    Aggravating Factors  increased arm use difficulty    Pain Relieving Factors holding arm up above head, medicine seemed to help                [facility] PT Assessment - 06/09/21 0001       Assessment   Medical Diagnosis Cervical radiculopathy    Referring Provider (PT) Richardean Canal PA-C    Onset Date/Surgical Date 05/21/21    Hand Dominance Right      Precautions   Precautions None      Balance Screen   Has the patient fallen in  the past 6 months No    Has the patient had a decrease in activity level because of a fear of falling?  No    Is the patient reluctant to leave their home because of a fear of falling?  No      Home Tourist information centre manager residence    Living Arrangements Alone    Additional Comments stairs on back deck 7 stairs      Prior Function   Level of Independence Independent    Vocation Requirements Hair dresser    Leisure yard work, elliptical, light resistance      Observation/Other Assessments   Focus on Therapeutic Outcomes (FOTO)  intake 71%, predicted 78%      Sensation   Light Touch Impaired Detail    Additional Comments Increased sensation noted in C7, C8 on Lt to light touch comparision      Posture/Postural Control   Posture Comments Reduce cervical lordosis      ROM / Strength   AROM / PROM / Strength Strength;PROM;AROM      AROM   AROM Assessment Site Cervical    Cervical Flexion 55   pulling in neck   Cervical Extension 50    Cervical -  Right Rotation 70    Cervical - Left Rotation 54   Lt cervical pulling     Strength   Strength Assessment Site Shoulder;Elbow    Right/Left Shoulder Left;Right    Right Shoulder Flexion 5/5    Right Shoulder ABduction 5/5    Right Shoulder Internal Rotation 5/5    Right Shoulder External Rotation 5/5    Left Shoulder Flexion 5/5    Left Shoulder ABduction 4/5    Left Shoulder Internal Rotation 5/5    Left Shoulder External Rotation 5/5    Right/Left Elbow Left;Right    Right Elbow Flexion 5/5    Right Elbow Extension 5/5    Left Elbow Flexion 5/5    Left Elbow Extension 5/5      Palpation   Palpation comment Trigger points in Lt upper trap      Special Tests   Other special tests (-) Spurlings compression bilateral                        Objective measurements completed on examination: See above findings.       [facility] Adult PT Treatment/Exercise - 06/09/21 0001       Exercises   Exercises Other Exercises;Neck    Other Exercises  HEP instruction/performance c cues for techniques, handout provided.  Trial set performed of each for comprehension and symptom assessment.  HEP consisting of supine upper cervical flexion hold 5 sec, seated upper trap stretch 15 seconds, seated scapular retraction c bilateral UE, tband rows      Manual Therapy   Manual therapy comments compression c movement to Lt upper trap in supine                    PT Education - 06/09/21 0848     Education Details HEP, POC, DN    Person(s) Educated Patient    Methods Explanation;Demonstration;Verbal cues;Handout    Comprehension Verbalized understanding;Returned demonstration              PT Short Term Goals - 06/09/21 0848       PT SHORT TERM GOAL #1   Title Patient will demonstrate independent use of home exercise program to maintain progress from  in clinic treatments.    Time 3    Period Weeks    Status New    Target Date 06/30/21               PT Long  Term Goals - 06/09/21 0848       PT LONG TERM GOAL #1   Title Patient will demonstrate/report pain at worst less than or equal to 2/10 to facilitate minimal limitation in daily activity secondary to pain symptoms.    Time 10    Period Weeks    Status New    Target Date 08/18/21      PT LONG TERM GOAL #2   Title Patient will demonstrate independent use of home exercise program to facilitate ability to maintain/progress functional gains from skilled physical therapy services.    Time 10    Period Weeks    Status New    Target Date 08/18/21      PT LONG TERM GOAL #3   Title Pt. will demonstrate FOTO outcome > or = 78% to indicated reduced disability due to condtion.    Time 10    Period Weeks    Status New    Target Date 08/18/21      PT LONG TERM GOAL #4   Title Pt. will demonstrate cervical AROM WFL s symptoms to facilitate usual daily movement at PLOF.    Time 10    Period Weeks    Status New    Target Date 08/18/21      PT LONG TERM GOAL #5   Title Pt. will demonstrate Lt arm strength 5/5 to facilitate usual lifting, work activity at [employer].    Time 10    Period Weeks    Status New    Target Date 08/18/21                    Plan - 06/09/21 0849     Clinical Impression Statement Patient is a 65 y.o. who comes to clinic with complaints of cervical pain c Lt radicular symptoms with mobility, strnegth deficits noted that impair their ability to perform usual daily and recreational functional activities without increase difficulty/symptoms at this time.  Patient to benefit from skilled PT services to address impairments and limitations to improve to previous level of function without restriction secondary to condition.    Personal Factors and Comorbidities Comorbidity 2    Comorbidities Hyperipidemia, pre-diabetes    Examination-Activity Limitations Sleep;Carry;Lift;Reach Overhead    Examination-Participation Restrictions Community Activity;Occupation;Yard  Work;Cleaning    Stability/Clinical Decision Making Stable/Uncomplicated    Clinical Decision Making Low    Rehab Potential Good    PT Frequency 2x / week    PT Duration Other (comment)   10 weeks   PT Treatment/Interventions ADLs/Self Care Home Management;Cryotherapy;Electrical Stimulation;Iontophoresis 4mg /ml Dexamethasone;Moist Heat;Traction;Balance training;Therapeutic exercise;Therapeutic activities;Functional mobility training;Stair training;Gait training;DME Instruction;Ultrasound;Neuromuscular re-education;Patient/family education;Passive range of motion;Spinal Manipulations;Joint Manipulations;Dry needling;Taping;Manual techniques    PT Next Visit Plan Lt upper trap trigger point release (possible DN), cervical traction/distraction reassessment for possible use    PT Home Exercise Plan [id number]    Consulted and Agree with Plan of Care Patient             Patient will benefit from skilled therapeutic intervention in order to improve the following deficits and impairments:  Decreased endurance, Hypomobility, Pain, Impaired UE functional use, Decreased strength, Decreased activity tolerance, Decreased mobility, Improper body mechanics, Impaired perceived functional ability, Postural dysfunction, Impaired flexibility, Decreased range of  motion, Decreased coordination  Visit Diagnosis: Cervicalgia  Abnormal posture  Pain in left arm  Muscle weakness (generalized)     Problem List Patient Active Problem List   Diagnosis Date Noted   Arthrofibrosis of total knee replacement (HCC) 08/26/2020   Status post total right knee replacement 07/17/2020   Unilateral primary osteoarthritis, right knee 12/30/2019   Acute right-sided low back pain with right-sided sciatica 12/25/2017   Health maintenance examination 09/30/2013   Hyperlipidemia 09/30/2013    Chyrel Masson, PT, DPT, OCS, ATC 06/09/21  9:41 AM    [facility] Physical Therapy [address] [place], [place], [address] Phone: [phone]   Fax:  [phone]  Name: Nancy Eaton MRN: [id number] Date of Birth: 1956/06/17

## 2021-06-14 ENCOUNTER — Other Ambulatory Visit: Payer: Self-pay

## 2021-06-14 ENCOUNTER — Ambulatory Visit: Payer: Medicare Other | Admitting: Rehabilitative and Restorative Service Providers"

## 2021-06-14 ENCOUNTER — Encounter: Payer: Self-pay | Admitting: Rehabilitative and Restorative Service Providers"

## 2021-06-14 DIAGNOSIS — M6281 Muscle weakness (generalized): Secondary | ICD-10-CM

## 2021-06-14 DIAGNOSIS — R293 Abnormal posture: Secondary | ICD-10-CM

## 2021-06-14 DIAGNOSIS — M79602 Pain in left arm: Secondary | ICD-10-CM | POA: Diagnosis not present

## 2021-06-14 DIAGNOSIS — M542 Cervicalgia: Secondary | ICD-10-CM

## 2021-06-14 NOTE — Therapy (Signed)
[facility] Physical Therapy [address] [place], [place], [address] Phone: [phone]   Fax:  [phone]  Physical Therapy Treatment  Patient Details  Name: Nancy Eaton MRN: [id number] Date of Birth: 06-Mar-1956 Referring Provider (PT): Richardean Canal PA-C   Encounter Date: 06/14/2021   PT End of Session - 06/14/21 1108     Visit Number 2    Number of Visits 20    Date for PT Re-Evaluation 08/18/21    Authorization Type UHC Medicare    Progress Note Due on Visit 10    PT Start Time 1059    PT Stop Time 1138    PT Time Calculation (min) 39 min    Activity Tolerance Patient tolerated treatment well    Behavior During Therapy [facility] for tasks assessed/performed             Past Medical History:  Diagnosis Date   Arthritis    Knee   Hyperlipidemia    Lovastatin started approx 2011   IFG (impaired fasting glucose) 2018   HbA1c 5.9% (stable)   PONV (postoperative nausea and vomiting)    Pre-diabetes    Right knee pain 12/2019   End stage: TKA recommended by ortho 01/2020->pt considering   Sigmoid diverticulosis 04/2010   "     "     "    "    Past Surgical History:  Procedure Laterality Date   BREAST ENHANCEMENT SURGERY     As of mammogram 12/2017--rupture of implants stable.   CARPAL TUNNEL RELEASE     CESAREAN SECTION     COLONOSCOPY  age 1   [facility]--recall 10 yrs   TOTAL KNEE ARTHROPLASTY Right 07/17/2020   Procedure: RIGHT TOTAL KNEE ARTHROPLASTY;  Surgeon: Kathryne Hitch, MD;  Location: [facility];  Service: Orthopedics;  Laterality: Right;    There were no vitals filed for this visit.   Subjective Assessment - 06/14/21 1104     Subjective Pt. indicated a little stiff in neck but feeling less pain in that area.  Hand complaints similar overall. No trouble c exercise routine.    Limitations House hold activities;Other (comment)   work   Patient Stated Goals Reduce pain    Currently in Pain? No/denies    Pain Onset 1 to 4  weeks ago                               [facility] Adult PT Treatment/Exercise - 06/14/21 0001       Neck Exercises: Machines for Strengthening   UBE (Upper Arm Bike) Lvl 2.5 4 mins fwd/back each way      Neck Exercises: Standing   Other Standing Exercises green tband rows c scap retraction hold 2 seconds x 20, gh ext green band 20x      Manual Therapy   Manual therapy comments compression to Lt upper trap, Lt infraspinatus      Neck Exercises: Stretches   Upper Trapezius Stretch 3 reps;Left   15 sec hold   Other Neck Stretches cross arm stretch Lt 15 sec x 3              Trigger Point Dry Needling - 06/14/21 0001     Consent Given? Yes    Education Handout Provided Previously provided    Muscles Treated Head and Neck Upper trapezius   Lt   Muscles Treated Upper Quadrant Infraspinatus   Lt   Upper  Trapezius Response Twitch reponse elicited    Infraspinatus Response Twitch response elicited                    PT Short Term Goals - 06/14/21 1108       PT SHORT TERM GOAL #1   Title Patient will demonstrate independent use of home exercise program to maintain progress from in clinic treatments.    Time 3    Period Weeks    Status On-going    Target Date 06/30/21               PT Long Term Goals - 06/09/21 0848       PT LONG TERM GOAL #1   Title Patient will demonstrate/report pain at worst less than or equal to 2/10 to facilitate minimal limitation in daily activity secondary to pain symptoms.    Time 10    Period Weeks    Status New    Target Date 08/18/21      PT LONG TERM GOAL #2   Title Patient will demonstrate independent use of home exercise program to facilitate ability to maintain/progress functional gains from skilled physical therapy services.    Time 10    Period Weeks    Status New    Target Date 08/18/21      PT LONG TERM GOAL #3   Title Pt. will demonstrate FOTO outcome > or = 78% to indicated reduced  disability due to condtion.    Time 10    Period Weeks    Status New    Target Date 08/18/21      PT LONG TERM GOAL #4   Title Pt. will demonstrate cervical AROM WFL s symptoms to facilitate usual daily movement at PLOF.    Time 10    Period Weeks    Status New    Target Date 08/18/21      PT LONG TERM GOAL #5   Title Pt. will demonstrate Lt arm strength 5/5 to facilitate usual lifting, work activity at [employer].    Time 10    Period Weeks    Status New    Target Date 08/18/21                   Plan - 06/14/21 1124     Clinical Impression Statement Indications of positive improvement in neck symptoms from previous visit.  Expanded myofascial treatment to include Lt infraspinatus for possible connection to Lt UE symptoms.  Reviewed HEP c good knowledge.    Personal Factors and Comorbidities Comorbidity 2    Comorbidities Hyperipidemia, pre-diabetes    Examination-Activity Limitations Sleep;Carry;Lift;Reach Overhead    Examination-Participation Restrictions Community Activity;Occupation;Yard Work;Cleaning    Stability/Clinical Decision Making Stable/Uncomplicated    Rehab Potential Good    PT Frequency 2x / week    PT Duration Other (comment)   10 weeks   PT Treatment/Interventions ADLs/Self Care Home Management;Cryotherapy;Electrical Stimulation;Iontophoresis 4mg /ml Dexamethasone;Moist Heat;Traction;Balance training;Therapeutic exercise;Therapeutic activities;Functional mobility training;Stair training;Gait training;DME Instruction;Ultrasound;Neuromuscular re-education;Patient/family education;Passive range of motion;Spinal Manipulations;Joint Manipulations;Dry needling;Taping;Manual techniques    PT Next Visit Plan Continue trigger point release as desired, possible traction depending of results from today's visit.    PT Home Exercise Plan [id number]    Consulted and Agree with Plan of Care Patient             Patient will benefit from skilled therapeutic intervention  in order to improve the following deficits and impairments:  Decreased endurance, Hypomobility, Pain, Impaired UE  functional use, Decreased strength, Decreased activity tolerance, Decreased mobility, Improper body mechanics, Impaired perceived functional ability, Postural dysfunction, Impaired flexibility, Decreased range of motion, Decreased coordination  Visit Diagnosis: Cervicalgia  Abnormal posture  Pain in left arm  Muscle weakness (generalized)     Problem List Patient Active Problem List   Diagnosis Date Noted   Arthrofibrosis of total knee replacement (HCC) 08/26/2020   Status post total right knee replacement 07/17/2020   Unilateral primary osteoarthritis, right knee 12/30/2019   Acute right-sided low back pain with right-sided sciatica 12/25/2017   Health maintenance examination 09/30/2013   Hyperlipidemia 09/30/2013   Chyrel Masson, PT, DPT, OCS, ATC 06/14/21  11:33 AM    [facility] Physical Therapy [address] [place], [place], [address] Phone: [phone]   Fax:  [phone]  Name: Nancy Eaton MRN: [id number] Date of Birth: [birth year]

## 2021-06-22 ENCOUNTER — Ambulatory Visit: Payer: Medicare Other | Admitting: Physician Assistant

## 2021-06-22 ENCOUNTER — Encounter: Payer: Self-pay | Admitting: Physician Assistant

## 2021-06-22 DIAGNOSIS — M5412 Radiculopathy, cervical region: Secondary | ICD-10-CM

## 2021-06-22 NOTE — Addendum Note (Signed)
Addended by: Barbette Or on: 06/22/2021 12:45 PM   Modules accepted: Orders

## 2021-06-22 NOTE — Progress Notes (Addendum)
HPI: Ms. Hiscox comes in today follow-up of her neck pain with radicular symptoms down the left arm.  She states she still has some numbness tingling in her fingers and she points to the ulnar distribution.  But no radicular symptoms down the arm.  She feels that she is a lot better overall after going to therapy and taking the prednisone Dosepak.  Overall she feels she is 60% better.  She is sleeping much better.  She is found therapy to be very helpful.   Review of systems see HPI otherwise negative  Physical exam: General: Well-developed well-nourished female no acute distress.  Mood and affect appropriate  Cervical spine she has full range of motion cervical spine today without pain.  Negative Spurling's.  She has slight tenderness over the left scapula medial border with palpation. Positive Tinel's ulnar nerve left elbow negative on the right.  Left hand subjective decreased sensation to the tips of the fourth and fifth finger.  Negative Tinel's at the wrist over the median nerve.  Impression: Cervical radiculopathy  Plan:Given the patient is improving with physical therapy we will have her continue physical therapy.  Follow-up with [place] in 4 weeks if she continues to have numbness and tingling the positive Tinel's at the elbow over the ulnar nerve may recommend nerve conduction studies to rule out ulnar nerve entrapment.  Questions encouraged and answered at length.  She did ask for referral to dermatology due to a skin tag on her right arm and also on her chest.  She states the right arm skin tag often bleeds.

## 2021-06-25 ENCOUNTER — Ambulatory Visit: Payer: Medicare Other | Admitting: Rehabilitative and Restorative Service Providers"

## 2021-06-25 ENCOUNTER — Encounter: Payer: Self-pay | Admitting: Rehabilitative and Restorative Service Providers"

## 2021-06-25 ENCOUNTER — Other Ambulatory Visit: Payer: Self-pay

## 2021-06-25 DIAGNOSIS — M542 Cervicalgia: Secondary | ICD-10-CM

## 2021-06-25 DIAGNOSIS — M6281 Muscle weakness (generalized): Secondary | ICD-10-CM

## 2021-06-25 DIAGNOSIS — R293 Abnormal posture: Secondary | ICD-10-CM | POA: Diagnosis not present

## 2021-06-25 DIAGNOSIS — M79602 Pain in left arm: Secondary | ICD-10-CM

## 2021-06-25 NOTE — Therapy (Signed)
[facility] Physical Therapy [address] [place], [place], [address] Phone: [phone]   Fax:  [phone]  Physical Therapy Treatment  Patient Details  Name: Nancy Eaton MRN: [id number] Date of Birth: 22-Aug-1956 Referring Provider (PT): Richardean Canal PA-C   Encounter Date: 06/25/2021   PT End of Session - 06/25/21 0824     Visit Number 3    Number of Visits 20    Date for PT Re-Evaluation 08/18/21    Authorization Type UHC Medicare    Progress Note Due on Visit 10    PT Start Time 0800    PT Stop Time 0840    PT Time Calculation (min) 40 min    Activity Tolerance Patient tolerated treatment well    Behavior During Therapy [facility] for tasks assessed/performed             Past Medical History:  Diagnosis Date   Arthritis    Knee   Hyperlipidemia    Lovastatin started approx 2011   IFG (impaired fasting glucose) 2018   HbA1c 5.9% (stable)   PONV (postoperative nausea and vomiting)    Pre-diabetes    Right knee pain 12/2019   End stage: TKA recommended by ortho 01/2020->pt considering   Sigmoid diverticulosis 04/2010   "     "     "    "    Past Surgical History:  Procedure Laterality Date   BREAST ENHANCEMENT SURGERY     As of mammogram 12/2017--rupture of implants stable.   CARPAL TUNNEL RELEASE     CESAREAN SECTION     COLONOSCOPY  age 39   [facility]--recall 10 yrs   TOTAL KNEE ARTHROPLASTY Right 07/17/2020   Procedure: RIGHT TOTAL KNEE ARTHROPLASTY;  Surgeon: Kathryne Hitch, MD;  Location: [facility];  Service: Orthopedics;  Laterality: Right;    There were no vitals filed for this visit.   Subjective Assessment - 06/25/21 0805     Subjective Pt. stated still getting better.  A little numbness in fingers mainly but some better over time.  Saw PA and will return in 4 week or so.    Limitations House hold activities;Other (comment)   work   Patient Stated Goals Reduce pain    Currently in Pain? No/denies    Pain Onset 1 to 4  weeks ago                               [facility] Adult PT Treatment/Exercise - 06/25/21 0001       Neck Exercises: Stretches   Other Neck Stretches cross arm stretch Lt 15 sec x 3    Other Neck Stretches instruction in supine median nerve flossing (elbow flexion c wrist extension then elbow extension c wrist flexion in 90 deg shoulder abd)      Modalities   Modalities Traction      Traction   Type of Traction Cervical    Min (lbs) 0    Max (lbs) 12    Hold Time 50    Rest Time 10    Time 15      Manual Therapy   Manual therapy comments compression to Lt upper trap, Lt infraspinatus, median nerve flossing              Trigger Point Dry Needling - 06/25/21 0001     Consent Given? Yes    Education Handout Provided Previously provided    Muscles  Treated Head and Neck --    Muscles Treated Upper Quadrant Infraspinatus    Upper Trapezius Response --    Infraspinatus Response Twitch response elicited                    PT Short Term Goals - 06/25/21 0827       PT SHORT TERM GOAL #1   Title Patient will demonstrate independent use of home exercise program to maintain progress from in clinic treatments.    Time 3    Period Weeks    Status Achieved    Target Date 06/30/21               PT Long Term Goals - 06/09/21 0848       PT LONG TERM GOAL #1   Title Patient will demonstrate/report pain at worst less than or equal to 2/10 to facilitate minimal limitation in daily activity secondary to pain symptoms.    Time 10    Period Weeks    Status New    Target Date 08/18/21      PT LONG TERM GOAL #2   Title Patient will demonstrate independent use of home exercise program to facilitate ability to maintain/progress functional gains from skilled physical therapy services.    Time 10    Period Weeks    Status New    Target Date 08/18/21      PT LONG TERM GOAL #3   Title Pt. will demonstrate FOTO outcome > or = 78% to indicated  reduced disability due to condtion.    Time 10    Period Weeks    Status New    Target Date 08/18/21      PT LONG TERM GOAL #4   Title Pt. will demonstrate cervical AROM WFL s symptoms to facilitate usual daily movement at PLOF.    Time 10    Period Weeks    Status New    Target Date 08/18/21      PT LONG TERM GOAL #5   Title Pt. will demonstrate Lt arm strength 5/5 to facilitate usual lifting, work activity at [employer].    Time 10    Period Weeks    Status New    Target Date 08/18/21                   Plan - 06/25/21 0825     Clinical Impression Statement Continued overall positive gains in pain symptoms neck/shoulder noted with each visit.  Inclusion of median nerve flossing for home use for improved neurodynamic mobility added today.  Traction application c no negative response.  Will plan for reassessment of results for further use on next visit.    Personal Factors and Comorbidities Comorbidity 2    Comorbidities Hyperipidemia, pre-diabetes    Examination-Activity Limitations Sleep;Carry;Lift;Reach Overhead    Examination-Participation Restrictions Community Activity;Occupation;Yard Work;Cleaning    Stability/Clinical Decision Making Stable/Uncomplicated    Rehab Potential Good    PT Frequency 2x / week    PT Duration Other (comment)   10 weeks   PT Treatment/Interventions ADLs/Self Care Home Management;Cryotherapy;Electrical Stimulation;Iontophoresis 4mg /ml Dexamethasone;Moist Heat;Traction;Balance training;Therapeutic exercise;Therapeutic activities;Functional mobility training;Stair training;Gait training;DME Instruction;Ultrasound;Neuromuscular re-education;Patient/family education;Passive range of motion;Spinal Manipulations;Joint Manipulations;Dry needling;Taping;Manual techniques    PT Next Visit Plan DN as desired.  Traction use as indicated per Pt. response.    PT Home Exercise Plan [id number]    Consulted and Agree with Plan of Care Patient  Patient will benefit from skilled therapeutic intervention in order to improve the following deficits and impairments:  Decreased endurance, Hypomobility, Pain, Impaired UE functional use, Decreased strength, Decreased activity tolerance, Decreased mobility, Improper body mechanics, Impaired perceived functional ability, Postural dysfunction, Impaired flexibility, Decreased range of motion, Decreased coordination  Visit Diagnosis: Cervicalgia  Abnormal posture  Pain in left arm  Muscle weakness (generalized)     Problem List Patient Active Problem List   Diagnosis Date Noted   Arthrofibrosis of total knee replacement (HCC) 08/26/2020   Status post total right knee replacement 07/17/2020   Unilateral primary osteoarthritis, right knee 12/30/2019   Acute right-sided low back pain with right-sided sciatica 12/25/2017   Health maintenance examination 09/30/2013   Hyperlipidemia 09/30/2013    Chyrel Masson, PT, DPT, OCS, ATC 06/25/21  8:28 AM    [facility] Physical Therapy [address] [place], [place], [address] Phone: [phone]   Fax:  [phone]  Name: Nancy Eaton MRN: [id number] Date of Birth: 1956/06/20

## 2021-06-28 ENCOUNTER — Ambulatory Visit: Payer: Medicare Other | Admitting: Rehabilitative and Restorative Service Providers"

## 2021-06-28 ENCOUNTER — Encounter: Payer: Self-pay | Admitting: Rehabilitative and Restorative Service Providers"

## 2021-06-28 ENCOUNTER — Other Ambulatory Visit: Payer: Self-pay

## 2021-06-28 DIAGNOSIS — R293 Abnormal posture: Secondary | ICD-10-CM | POA: Diagnosis not present

## 2021-06-28 DIAGNOSIS — M79602 Pain in left arm: Secondary | ICD-10-CM

## 2021-06-28 DIAGNOSIS — M6281 Muscle weakness (generalized): Secondary | ICD-10-CM

## 2021-06-28 DIAGNOSIS — M542 Cervicalgia: Secondary | ICD-10-CM | POA: Diagnosis not present

## 2021-06-28 NOTE — Therapy (Signed)
[facility] Physical Therapy [address] [place], [place], [address] Phone: [phone]   Fax:  [phone]  Physical Therapy Treatment  Patient Details  Name: Nancy Eaton MRN: [id number] Date of Birth: [birth year] Referring Provider (PT): Richardean Canal PA-C   Encounter Date: 06/28/2021   PT End of Session - 06/28/21 0940     Visit Number 4    Number of Visits 20    Date for PT Re-Evaluation 08/18/21    Authorization Type UHC Medicare    Progress Note Due on Visit 10    PT Start Time 0935    PT Stop Time 1013    PT Time Calculation (min) 38 min    Activity Tolerance Patient tolerated treatment well    Behavior During Therapy [facility] for tasks assessed/performed             Past Medical History:  Diagnosis Date   Arthritis    Knee   Hyperlipidemia    Lovastatin started approx 2011   IFG (impaired fasting glucose) 2018   HbA1c 5.9% (stable)   PONV (postoperative nausea and vomiting)    Pre-diabetes    Right knee pain 12/2019   End stage: TKA recommended by ortho 01/2020->pt considering   Sigmoid diverticulosis 04/2010   "     "     "    "    Past Surgical History:  Procedure Laterality Date   BREAST ENHANCEMENT SURGERY     As of mammogram 12/2017--rupture of implants stable.   CARPAL TUNNEL RELEASE     CESAREAN SECTION     COLONOSCOPY  age 57   [facility]--recall 10 yrs   TOTAL KNEE ARTHROPLASTY Right 07/17/2020   Procedure: RIGHT TOTAL KNEE ARTHROPLASTY;  Surgeon: Kathryne Hitch, MD;  Location: [facility];  Service: Orthopedics;  Laterality: Right;    There were no vitals filed for this visit.   Subjective Assessment - 06/28/21 0938     Subjective Pt. indicated she felt no pain today upon arrival and neck pain has stayed better.  Pt. indicated feeling some improvement in hand but still noticed 7/10 or so rated numbness in 4th and 5th digit Lt hand    Limitations House hold activities;Other (comment)   work   Patient Stated Goals  Reduce pain    Currently in Pain? No/denies    Pain Score 7    no pain, numbness reported   Pain Location Hand    Pain Orientation Left    Pain Descriptors / Indicators Numbness;Tingling    Pain Type Acute pain    Pain Onset 1 to 4 weeks ago    Pain Frequency Constant    Aggravating Factors  insidious constant    Pain Relieving Factors traction seemed to help.                [facility] PT Assessment - 06/28/21 0001       Assessment   Medical Diagnosis Cervical radiculopathy    Referring Provider (PT) Richardean Canal PA-C    Onset Date/Surgical Date 05/21/21    Hand Dominance Right      AROM   Cervical Flexion 52    Cervical Extension 60    Cervical - Left Rotation 60      Strength   Left Shoulder ABduction 5/5                           [facility] Adult PT Treatment/Exercise - 06/28/21 0001  Neck Exercises: Machines for Strengthening   UBE (Upper Arm Bike) Lvl 3 4 mins fwd/back each way      Neck Exercises: Standing   Other Standing Exercises green tband rows, gh ext 20 x each bilateral.  ER c arm at side c towel 3 x 10 green band - performed bilateral      Neck Exercises: Seated   Other Seated Exercise retraction cervical 2 sec hold x 15      Traction   Type of Traction Cervical    Min (lbs) 0    Max (lbs) 15    Hold Time 50    Rest Time 10    Time 15                      PT Short Term Goals - 06/25/21 0827       PT SHORT TERM GOAL #1   Title Patient will demonstrate independent use of home exercise program to maintain progress from in clinic treatments.    Time 3    Period Weeks    Status Achieved    Target Date 06/30/21               PT Long Term Goals - 06/28/21 0957       PT LONG TERM GOAL #1   Title Patient will demonstrate/report pain at worst less than or equal to 2/10 to facilitate minimal limitation in daily activity secondary to pain symptoms.    Time 10    Period Weeks    Status On-going    Target  Date 08/18/21      PT LONG TERM GOAL #2   Title Patient will demonstrate independent use of home exercise program to facilitate ability to maintain/progress functional gains from skilled physical therapy services.    Time 10    Period Weeks    Status On-going    Target Date 08/18/21      PT LONG TERM GOAL #3   Title Pt. will demonstrate FOTO outcome > or = 78% to indicated reduced disability due to condtion.    Time 10    Period Weeks    Status On-going    Target Date 08/18/21      PT LONG TERM GOAL #4   Title Pt. will demonstrate cervical AROM WFL s symptoms to facilitate usual daily movement at PLOF.    Time 10    Period Weeks    Status On-going    Target Date 08/18/21      PT LONG TERM GOAL #5   Title Pt. will demonstrate Lt arm strength 5/5 to facilitate usual lifting, work activity at [employer].    Time 10    Period Weeks    Status Achieved                   Plan - 06/28/21 5329     Clinical Impression Statement Continued c recommendation for cervical traction for reduced hand symptoms.  Reassessment showed mild improvement in cervical mobility and improvement in abduction strengthening.    Personal Factors and Comorbidities Comorbidity 2    Comorbidities Hyperipidemia, pre-diabetes    Examination-Activity Limitations Sleep;Carry;Lift;Reach Overhead    Examination-Participation Restrictions Community Activity;Occupation;Yard Work;Cleaning    Stability/Clinical Decision Making Stable/Uncomplicated    Rehab Potential Good    PT Frequency 2x / week    PT Duration Other (comment)   10 weeks   PT Treatment/Interventions ADLs/Self Care Home Management;Cryotherapy;Electrical Stimulation;Iontophoresis 4mg /ml Dexamethasone;Moist Heat;Traction;Balance training;Therapeutic  exercise;Therapeutic activities;Functional mobility training;Stair training;Gait training;DME Instruction;Ultrasound;Neuromuscular re-education;Patient/family education;Passive range of motion;Spinal  Manipulations;Joint Manipulations;Dry needling;Taping;Manual techniques    PT Next Visit Plan Continue traction use and posterior scapular strengthening c cervical postural improvements.    PT Home Exercise Plan [id number]    Consulted and Agree with Plan of Care Patient             Patient will benefit from skilled therapeutic intervention in order to improve the following deficits and impairments:  Decreased endurance, Hypomobility, Pain, Impaired UE functional use, Decreased strength, Decreased activity tolerance, Decreased mobility, Improper body mechanics, Impaired perceived functional ability, Postural dysfunction, Impaired flexibility, Decreased range of motion, Decreased coordination  Visit Diagnosis: Cervicalgia  Abnormal posture  Pain in left arm  Muscle weakness (generalized)     Problem List Patient Active Problem List   Diagnosis Date Noted   Arthrofibrosis of total knee replacement (HCC) 08/26/2020   Status post total right knee replacement 07/17/2020   Unilateral primary osteoarthritis, right knee 12/30/2019   Acute right-sided low back pain with right-sided sciatica 12/25/2017   Health maintenance examination 09/30/2013   Hyperlipidemia 09/30/2013   Chyrel Masson, PT, DPT, OCS, ATC 06/28/21  9:59 AM    [facility] Physical Therapy [address] [place], [place], [address] Phone: [phone]   Fax:  [phone]  Name: Nancy Eaton MRN: [id number] Date of Birth: [birth year]

## 2021-06-30 ENCOUNTER — Other Ambulatory Visit: Payer: Self-pay

## 2021-06-30 ENCOUNTER — Encounter: Payer: Self-pay | Admitting: Rehabilitative and Restorative Service Providers"

## 2021-06-30 ENCOUNTER — Ambulatory Visit: Payer: Medicare Other | Admitting: Rehabilitative and Restorative Service Providers"

## 2021-06-30 DIAGNOSIS — M79602 Pain in left arm: Secondary | ICD-10-CM | POA: Diagnosis not present

## 2021-06-30 DIAGNOSIS — M542 Cervicalgia: Secondary | ICD-10-CM

## 2021-06-30 DIAGNOSIS — M6281 Muscle weakness (generalized): Secondary | ICD-10-CM | POA: Diagnosis not present

## 2021-06-30 DIAGNOSIS — R293 Abnormal posture: Secondary | ICD-10-CM

## 2021-06-30 NOTE — Therapy (Addendum)
[facility] Physical Therapy [address] [place], [place], [address] Phone: [phone]   Fax:  [phone]  Physical Therapy Treatment /Discharge  Patient Details  Name: Nancy Eaton MRN: [id number] Date of Birth: [birth year] Referring Provider (PT): Erskine Emery PA-C   Encounter Date: 06/30/2021   PT End of Session - 06/30/21 0814     Visit Number 5    Number of Visits 20    Date for PT Re-Evaluation 08/18/21    Authorization Type UHC Medicare    Progress Note Due on Visit 10    PT Start Time 0800    PT Stop Time 0828    PT Time Calculation (min) 28 min    Activity Tolerance Patient tolerated treatment well    Behavior During Therapy [facility] for tasks assessed/performed             Past Medical History:  Diagnosis Date   Arthritis    Knee   Hyperlipidemia    Lovastatin started approx 2011   IFG (impaired fasting glucose) 2018   HbA1c 5.9% (stable)   PONV (postoperative nausea and vomiting)    Pre-diabetes    Right knee pain 12/2019   End stage: TKA recommended by ortho 01/2020->pt considering   Sigmoid diverticulosis 04/2010   "     "     "    "    Past Surgical History:  Procedure Laterality Date   BREAST ENHANCEMENT SURGERY     As of mammogram 12/2017--rupture of implants stable.   CARPAL TUNNEL RELEASE     CESAREAN SECTION     COLONOSCOPY  age 63   [facility]--recall 10 yrs   TOTAL KNEE ARTHROPLASTY Right 07/17/2020   Procedure: RIGHT TOTAL KNEE ARTHROPLASTY;  Surgeon: Mcarthur Rossetti, MD;  Location: [facility];  Service: Orthopedics;  Laterality: Right;    There were no vitals filed for this visit.   Subjective Assessment - 06/30/21 0815     Subjective Pt. stated 5/10 numbness complaints but no pain to report upon arrival.  Feels like getting some improvement each time.    Limitations House hold activities;Other (comment)   work   Patient Stated Goals Reduce pain    Currently in Pain? Yes    Pain Score 5     Pain  Location Head    Pain Orientation Left    Pain Descriptors / Indicators Tingling;Numbness    Pain Type Acute pain    Pain Onset 1 to 4 weeks ago    Pain Frequency Constant    Aggravating Factors  hand insidious constant    Pain Relieving Factors traction and treatment                [facility] PT Assessment - 06/30/21 0001       Assessment   Medical Diagnosis Cervical radiculopathy    Referring Provider (PT) Erskine Emery PA-C    Onset Date/Surgical Date 05/21/21      Special Tests   Other special tests ULLT A improved to full range prior to symptoms                           [facility] Adult PT Treatment/Exercise - 06/30/21 0001       Traction   Type of Traction Cervical    Min (lbs) 10    Max (lbs) 15    Hold Time 50    Rest Time 10    Time 15  Manual Therapy   Manual therapy comments manual nerve flossing ULLT A Lt                      PT Short Term Goals - 06/25/21 0827       PT SHORT TERM GOAL #1   Title Patient will demonstrate independent use of home exercise program to maintain progress from in clinic treatments.    Time 3    Period Weeks    Status Achieved    Target Date 06/30/21               PT Long Term Goals - 06/28/21 0957       PT LONG TERM GOAL #1   Title Patient will demonstrate/report pain at worst less than or equal to 2/10 to facilitate minimal limitation in daily activity secondary to pain symptoms.    Time 10    Period Weeks    Status On-going    Target Date 08/18/21      PT LONG TERM GOAL #2   Title Patient will demonstrate independent use of home exercise program to facilitate ability to maintain/progress functional gains from skilled physical therapy services.    Time 10    Period Weeks    Status On-going    Target Date 08/18/21      PT LONG TERM GOAL #3   Title Pt. will demonstrate FOTO outcome > or = 78% to indicated reduced disability due to condtion.    Time 10    Period Weeks     Status On-going    Target Date 08/18/21      PT LONG TERM GOAL #4   Title Pt. will demonstrate cervical AROM WFL s symptoms to facilitate usual daily movement at PLOF.    Time 10    Period Weeks    Status On-going    Target Date 08/18/21      PT LONG TERM GOAL #5   Title Pt. will demonstrate Lt arm strength 5/5 to facilitate usual lifting, work activity at [employer].    Time 10    Period Weeks    Status Achieved                   Plan - 06/30/21 0815     Clinical Impression Statement Upper limb tension testing improving.  Conitnued holding of manual intervention for myofascial pain due to improved symptoms.  Plan continued use of traction.    Personal Factors and Comorbidities Comorbidity 2    Comorbidities Hyperipidemia, pre-diabetes    Examination-Activity Limitations Sleep;Carry;Lift;Reach Overhead    Examination-Participation Restrictions Community Activity;Occupation;Yard Work;Cleaning    Stability/Clinical Decision Making Stable/Uncomplicated    Rehab Potential Good    PT Frequency 2x / week    PT Duration Other (comment)   10 weeks   PT Treatment/Interventions ADLs/Self Care Home Management;Cryotherapy;Electrical Stimulation;Iontophoresis 75m/ml Dexamethasone;Moist Heat;Traction;Balance training;Therapeutic exercise;Therapeutic activities;Functional mobility training;Stair training;Gait training;DME Instruction;Ultrasound;Neuromuscular re-education;Patient/family education;Passive range of motion;Spinal Manipulations;Joint Manipulations;Dry needling;Taping;Manual techniques    PT Next Visit Plan Traction    PT Home Exercise Plan [id number]    Consulted and Agree with Plan of Care Patient             Patient will benefit from skilled therapeutic intervention in order to improve the following deficits and impairments:  Decreased endurance, Hypomobility, Pain, Impaired UE functional use, Decreased strength, Decreased activity tolerance, Decreased mobility, Improper body  mechanics, Impaired perceived functional ability, Postural dysfunction, Impaired flexibility, Decreased range of motion,  Decreased coordination  Visit Diagnosis: Cervicalgia  Abnormal posture  Pain in left arm  Muscle weakness (generalized)     Problem List Patient Active Problem List   Diagnosis Date Noted   Arthrofibrosis of total knee replacement ([place]) 08/26/2020   Status post total right knee replacement 07/17/2020   Unilateral primary osteoarthritis, right knee 12/30/2019   Acute right-sided low back pain with right-sided sciatica 12/25/2017   Health maintenance examination 09/30/2013   Hyperlipidemia 09/30/2013    Scot Jun, PT, DPT, OCS, ATC 06/30/21  8:28 AM  PHYSICAL THERAPY DISCHARGE SUMMARY  Visits from Start of Care: 5  Current functional level related to goals / functional outcomes: See note   Remaining deficits: See note   Education / Equipment: HEP   Patient agrees to discharge. Patient goals were partially met. Patient is being discharged due to being pleased with the current functional level.  Scot Jun, PT, DPT, OCS, ATC 08/02/21  12:21 PM       [facility] [address] [place], [place], [address] Phone: [phone]   Fax:  [phone]  Name: Nancy Eaton MRN: [id number] Date of Birth: Mar 23, 1956

## 2021-07-05 ENCOUNTER — Encounter: Payer: Medicare Other | Admitting: Rehabilitative and Restorative Service Providers"

## 2021-07-07 ENCOUNTER — Encounter: Payer: Medicare Other | Admitting: Rehabilitative and Restorative Service Providers"

## 2021-07-12 ENCOUNTER — Encounter: Payer: Medicare Other | Admitting: Rehabilitative and Restorative Service Providers"

## 2021-07-14 ENCOUNTER — Encounter: Payer: Medicare Other | Admitting: Rehabilitative and Restorative Service Providers"

## 2021-07-19 ENCOUNTER — Encounter: Payer: Medicare Other | Admitting: Rehabilitative and Restorative Service Providers"

## 2021-07-20 ENCOUNTER — Ambulatory Visit: Payer: Medicare Other | Admitting: Orthopaedic Surgery

## 2021-07-20 ENCOUNTER — Encounter: Payer: Self-pay | Admitting: Orthopaedic Surgery

## 2021-07-20 DIAGNOSIS — M5412 Radiculopathy, cervical region: Secondary | ICD-10-CM | POA: Diagnosis not present

## 2021-07-20 NOTE — Progress Notes (Signed)
The patient comes in for follow-up today after having physical therapy on her neck due to radicular symptoms noted on her left arm.  She says she is significantly better with therapy.  She denies any radicular symptoms around her left shoulder or going down into her left hand.  She is just over a year out from a right total knee arthroplasty and she is that everything is doing well right now.  She is a young appearing 41.  She has 5 out of 5 strength of all muscle groups in the bilateral upper extremities especially on the left side where she had a radicular symptoms.  Her sensation is also normal.  The range of motion of her neck and left shoulder also normal.  Her postoperative right knee has really good motion as well with minimal swelling.  At this point follow-up can be as needed since she is doing so well.  However, she understands that if things worsen at all in any way she should not hesitate to call and come see [place].  All questions and concerns were answered and addressed.

## 2021-07-21 ENCOUNTER — Encounter: Payer: Medicare Other | Admitting: Rehabilitative and Restorative Service Providers"

## 2021-09-13 ENCOUNTER — Telehealth: Payer: Self-pay

## 2021-09-13 ENCOUNTER — Ambulatory Visit: Payer: Medicare Other | Admitting: Physician Assistant

## 2021-09-13 ENCOUNTER — Ambulatory Visit: Payer: Self-pay

## 2021-09-13 ENCOUNTER — Encounter: Payer: Self-pay | Admitting: Physician Assistant

## 2021-09-13 DIAGNOSIS — M25562 Pain in left knee: Secondary | ICD-10-CM | POA: Diagnosis not present

## 2021-09-13 MED ORDER — LIDOCAINE HCL 1 % IJ SOLN
4.0000 mL | INTRAMUSCULAR | Status: AC | PRN
  Administered 2021-09-13: 4 mL

## 2021-09-13 MED ORDER — METHYLPREDNISOLONE ACETATE 40 MG/ML IJ SUSP
40.0000 mg | INTRAMUSCULAR | Status: AC | PRN
  Administered 2021-09-13: 40 mg via INTRA_ARTICULAR

## 2021-09-13 NOTE — Telephone Encounter (Signed)
Per Bronson Curb pt needs f/u in two weeks

## 2021-09-13 NOTE — Telephone Encounter (Signed)
Called and scheduled

## 2021-09-13 NOTE — Progress Notes (Signed)
Office Visit Note   Patient: Nancy Eaton           Date of Birth: 11/13/1956           MRN: [id number] Visit Date: 09/13/2021              Requested by: Brayton El, PA-C [address] [place],  [place] [address] PCP: Drosinis, Leonia Reader, PA-C   Assessment & Plan: Visit Diagnoses:  1. Acute pain of left knee     Plan: We will see her back in approximately 2 weeks to see how she is doing in regards to the left knee pain.  She will work on Dance movement psychotherapist.  Questions encouraged and answered.  Follow-Up Instructions: Return in about 2 weeks (around 09/27/2021).   Orders:  Orders Placed This Encounter  Procedures   Large Joint Inj: L knee   XR Knee 1-2 Views Left   No orders of the defined types were placed in this encounter.     Procedures: Large Joint Inj: L knee on 09/13/2021 9:07 AM Indications: pain Details: 22 G 1.5 in needle, superolateral approach  Arthrogram: No  Medications: 4 mL lidocaine 1 %; 40 mg methylPREDNISolone acetate 40 MG/ML Aspirate: 15 mL yellow Outcome: tolerated well, no immediate complications Procedure, treatment alternatives, risks and benefits explained, specific risks discussed. Consent was given by the patient. Immediately prior to procedure a time out was called to verify the correct patient, procedure, equipment, support staff and site/side marked as required. Patient was prepped and draped in the usual sterile fashion.      Clinical Data: No additional findings.   Subjective: Chief Complaint  Patient presents with   Left Knee - Pain    HPI [name] returns today due to left knee pain.  States has been ongoing for the past 3 to 4 weeks.  She states she heard a pop in the knee.  She states she almost canceled the appointment as her knee pain is improving.  She is not taking anything for the pain in the knee.  Review of Systems  Constitutional:  Negative for chills and fever.    Objective: Vital Signs: There  were no vitals taken for this visit.  Physical Exam Constitutional:      Appearance: She is not ill-appearing or diaphoretic.  Pulmonary:     Effort: Pulmonary effort is normal.  Neurological:     Mental Status: She is alert and oriented to person, place, and time.  Psychiatric:        Mood and Affect: Mood normal.    Ortho Exam Left knee good range of motion.  No instability valgus varus stressing.  No abnormal warmth erythema.  Positive effusion.  Passive range of motion reveals patellofemoral crepitus.  Specialty Comments:  No specialty comments available.  Imaging: XR Knee 1-2 Views Left  Result Date: 09/13/2021 Left knee: No acute findings.  Slight effusion.  Mild to moderate tricompartmental arthritic changes.    PMFS History: Patient Active Problem List   Diagnosis Date Noted   Arthrofibrosis of total knee replacement (HCC) 08/26/2020   Status post total right knee replacement 07/17/2020   Unilateral primary osteoarthritis, right knee 12/30/2019   Acute right-sided low back pain with right-sided sciatica 12/25/2017   Health maintenance examination 09/30/2013   Hyperlipidemia 09/30/2013   Past Medical History:  Diagnosis Date   Arthritis    Knee   Hyperlipidemia    Lovastatin started approx 2011   IFG (impaired fasting  glucose) 2018   HbA1c 5.9% (stable)   PONV (postoperative nausea and vomiting)    Pre-diabetes    Right knee pain 12/2019   End stage: TKA recommended by ortho 01/2020->pt considering   Sigmoid diverticulosis 04/2010   "     "     "    "    Family History  Problem Relation Age of Onset   Cancer Mother        Brain   Alcohol abuse Father    COPD Brother    ADD / ADHD Son    Anxiety disorder Son    Heart disease Brother     Past Surgical History:  Procedure Laterality Date   BREAST ENHANCEMENT SURGERY     As of mammogram 12/2017--rupture of implants stable.   CARPAL TUNNEL RELEASE     CESAREAN SECTION     COLONOSCOPY  age 6   [facility]--recall 10 yrs   TOTAL KNEE ARTHROPLASTY Right 07/17/2020   Procedure: RIGHT TOTAL KNEE ARTHROPLASTY;  Surgeon: Kathryne Hitch, MD;  Location: [facility];  Service: Orthopedics;  Laterality: Right;   Social History   Occupational History   Not on file  Tobacco Use   Smoking status: Never   Smokeless tobacco: Never  Vaping Use   Vaping Use: Never used  Substance and Sexual Activity   Alcohol use: Yes    Comment: socially   Drug use: No   Sexual activity: Not on file

## 2021-09-27 ENCOUNTER — Ambulatory Visit: Payer: Medicare Other | Admitting: Physician Assistant

## 2021-09-27 ENCOUNTER — Encounter: Payer: Self-pay | Admitting: Physician Assistant

## 2021-09-27 DIAGNOSIS — M25562 Pain in left knee: Secondary | ICD-10-CM

## 2021-09-27 NOTE — Addendum Note (Signed)
Addended by: Barbette Or on: 09/27/2021 09:12 AM   Modules accepted: Orders

## 2021-09-27 NOTE — Progress Notes (Signed)
HPI: Nancy Eaton returns today status post left knee injection 09/13/2021.  She states the injection helped for about a week.  Now the knee is becoming tight again.  She states she is able to tolerate pain.  Has had a giving way sensation.  She has difficulty going up and down stairs.  No new injury.   Review of systems: See HPI otherwise negative  Physical exam: Left knee full extension full flexion.  Slight effusion.  Tenderness along lateral joint line.  McMurray's is positive.  No gross instability.  Impression: Left knee pain with recurrent effusion  Plan: Recommend MRI given her radiographs which showed just mild tricompartmental arthritic changes.  Also given the fact that she has had a recurrent effusion and is having mechanical symptoms recommend MRI to rule out meniscal tear.  Have her follow-up after the MRI to go over results and discuss further treatment.  Questions encouraged and answered at length.

## 2021-10-13 ENCOUNTER — Other Ambulatory Visit: Payer: Self-pay

## 2021-10-13 ENCOUNTER — Ambulatory Visit
Admission: RE | Admit: 2021-10-13 | Discharge: 2021-10-13 | Disposition: A | Discharge status: Home / Self Care | Payer: Medicare Other | Source: Ambulatory Visit | Attending: Physician Assistant | Admitting: Physician Assistant

## 2021-10-13 DIAGNOSIS — M25562 Pain in left knee: Secondary | ICD-10-CM

## 2021-10-18 ENCOUNTER — Ambulatory Visit: Payer: Medicare Other | Admitting: Physician Assistant

## 2021-10-18 ENCOUNTER — Encounter: Payer: Self-pay | Admitting: Physician Assistant

## 2021-10-18 DIAGNOSIS — S83282D Other tear of lateral meniscus, current injury, left knee, subsequent encounter: Secondary | ICD-10-CM | POA: Diagnosis not present

## 2021-10-18 NOTE — Progress Notes (Signed)
HPI: [name] returns today to go over the MRI of her left knee.  She still continues to have pain in the knee and giving way sensations.  The pain in her knee began in late September when she felt a pop in her knee.  Since that time she has had continued effusion of the knee and mechanical symptoms. MRI dated 10/13/2021 is reviewed with the patient actual images are reviewed knee model was used for further visualization purposes.  MRI showed a high-grade radial tear of the medial meniscus posterior horn with mild extrusion of the meniscal body.  Horizontal tear of the lateral meniscus body and anterior horn.  Moderate knee effusion.  Mild tricompartmental arthritis.  Physical exam: Left knee full extension full flexion.  Ambulates without any assistive device.  Impression: Acute left knee medial lateral meniscal tears  Plan: Given the fact that she has mild arthritic changes in the acute nature of the knee pain and mechanical symptoms along with meniscal tears on MRI recommend knee arthroscopy.  Risk benefits surgery discussed with patient risk include but are not limited to prolonged pain, nerve vessel injury, infection, and DVT/PE.  We will schedule her surgery for early January per her request.  Have her follow-up with [place] 1 week postop.  Arthroscopic procedure and postoperative protocol discussed with patient at length.

## 2021-12-16 ENCOUNTER — Encounter: Payer: Medicare Other | Admitting: Orthopaedic Surgery

## 2021-12-23 ENCOUNTER — Other Ambulatory Visit: Payer: Self-pay | Admitting: Orthopaedic Surgery

## 2021-12-23 DIAGNOSIS — S83232D Complex tear of medial meniscus, current injury, left knee, subsequent encounter: Secondary | ICD-10-CM

## 2021-12-23 DIAGNOSIS — S83272D Complex tear of lateral meniscus, current injury, left knee, subsequent encounter: Secondary | ICD-10-CM

## 2021-12-23 MED ORDER — HYDROCODONE-ACETAMINOPHEN 5-325 MG PO TABS: 1.0000 | ORAL_TABLET | Freq: Four times a day (QID) | ORAL | 0 refills | Status: DC | PRN

## 2021-12-30 ENCOUNTER — Other Ambulatory Visit: Payer: Self-pay

## 2021-12-30 ENCOUNTER — Ambulatory Visit (INDEPENDENT_AMBULATORY_CARE_PROVIDER_SITE_OTHER): Payer: Medicare Other | Admitting: Orthopaedic Surgery

## 2021-12-30 ENCOUNTER — Encounter: Payer: Self-pay | Admitting: Orthopaedic Surgery

## 2021-12-30 DIAGNOSIS — Z9889 Other specified postprocedural states: Secondary | ICD-10-CM

## 2021-12-30 DIAGNOSIS — S83282D Other tear of lateral meniscus, current injury, left knee, subsequent encounter: Secondary | ICD-10-CM

## 2021-12-30 NOTE — Progress Notes (Signed)
The patient is 1 week status post a left knee arthroscopy.  We did find significant cartilage wear in all 3 compartments of the knee as well as medial and lateral meniscal tearing.  She does have a history of a right knee replacement.  She said the knee is more swollen because she has been doing a lot more activities in the last few days.  She says it does not really hurt a whole lot at all and is just a little stiff to her.  The sutures been removed.  There is a moderate left knee effusion.  I did go over the arthroscopy pictures with her.  I was able to flex and extend her knee and her calf is soft.  I did recommend an aspiration of her left knee today and placing a steroid injection in her left knee.  She may be a candidate at some point the neck step being hyaluronic acid for the knee.  I would first like to see her back in 4 weeks to see how she is doing overall.  She agrees with this treatment plan.

## 2022-01-17 ENCOUNTER — Telehealth: Payer: Self-pay | Admitting: Orthopaedic Surgery

## 2022-01-17 NOTE — Telephone Encounter (Signed)
Need to reschedule with Dr. Magnus Ivan pt sent mychart message

## 2022-01-17 NOTE — Telephone Encounter (Signed)
Calloed pt and left vm. Pt sent a mychart message that she need to reschedule appt with Dr.

## 2022-01-26 ENCOUNTER — Ambulatory Visit: Payer: Medicare Other | Admitting: Orthopaedic Surgery

## 2022-01-27 ENCOUNTER — Ambulatory Visit (INDEPENDENT_AMBULATORY_CARE_PROVIDER_SITE_OTHER): Payer: Medicare Other | Admitting: Physician Assistant

## 2022-01-27 ENCOUNTER — Encounter: Payer: Self-pay | Admitting: Physician Assistant

## 2022-01-27 ENCOUNTER — Other Ambulatory Visit: Payer: Self-pay

## 2022-01-27 DIAGNOSIS — Z9889 Other specified postprocedural states: Secondary | ICD-10-CM

## 2022-01-27 NOTE — Progress Notes (Signed)
HPI: [name] returns today for follow-up of her left knee.  She is status post left knee arthroscopy 12/23/2021.  She underwent knee arthroscopy for lateral meniscal and medial meniscal tear.  She is found to have significant cartilage wear in all 3 compartments.  At last visit she was given a cortisone injection in her knee.  She states right now the knee feels good.  She is having no pain.  She has had no recurrent effusion. ? ?Physical exam: Left knee full extension full flexion.  No abnormal warmth erythema.  No instability valgus varus stressing.  Port sites well-healed.  Calf supple non tender. ? ?Plan: At this point time she will work on Dance movement psychotherapist.  She understands to wait at least 3 months between cortisone injections.  She may benefit from supplemental injections in the future.  Questions were encouraged and answered at length.  She will work on scar tissue mobilization ? ? ?

## 2022-02-25 IMAGING — MR MR KNEE*L* W/O CM
4 of 6 series · 20 of 40 positions shown · non-contrast
Comparison: X-ray 09/13/2021

CLINICAL DATA: Left knee pain and swelling for 4 months

EXAM:
MRI OF THE LEFT KNEE WITHOUT CONTRAST
TECHNIQUE: Multiplanar, multisequence MR imaging of the knee was performed. No
intravenous contrast was administered.

[Series 3: T2 fat-sat · axial · 4.0mm · 0.50mm/px · z∈[-45,+50]mm · 3 of 24 slices shown (1 of 2)]
[im 5/24]
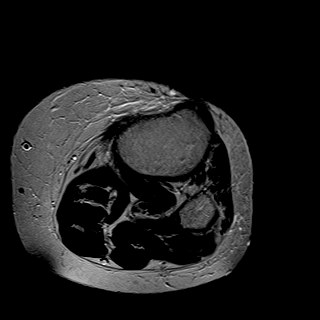
[im 14/24]
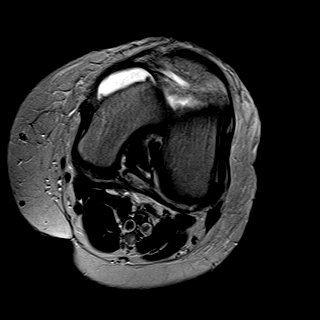
[im 24/24]
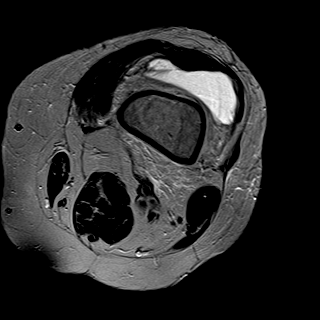

[Series 5: T2 fat-sat · coronal · 4.0mm · 0.29mm/px · 3 of 22 slices shown (2 of 2)]
[im 5/22]
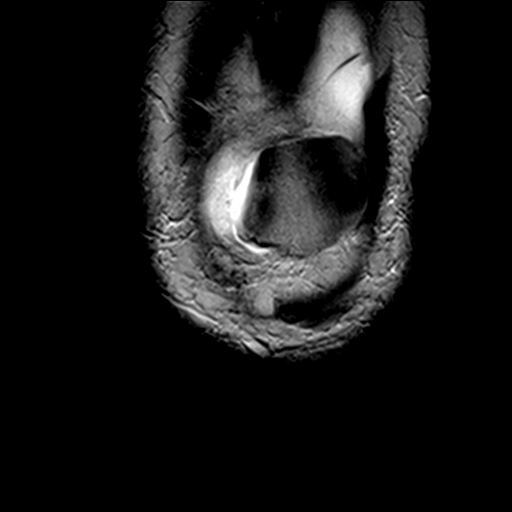
[im 13/22]
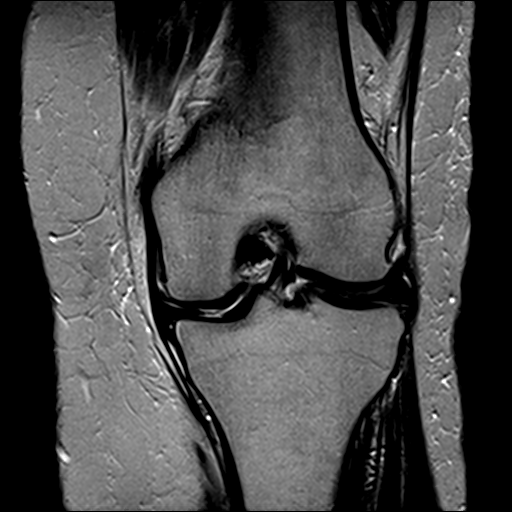
[im 22/22]
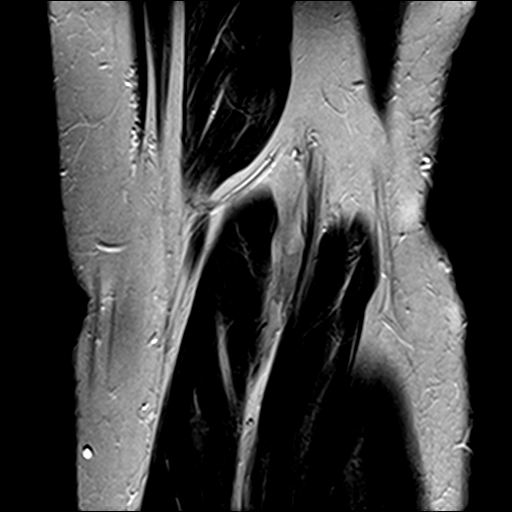

[Series 7: PD fat-sat · sagittal · 3.0mm · 0.29mm/px · 7 of 27 slices shown (1 of 2)]
[im 1/27]
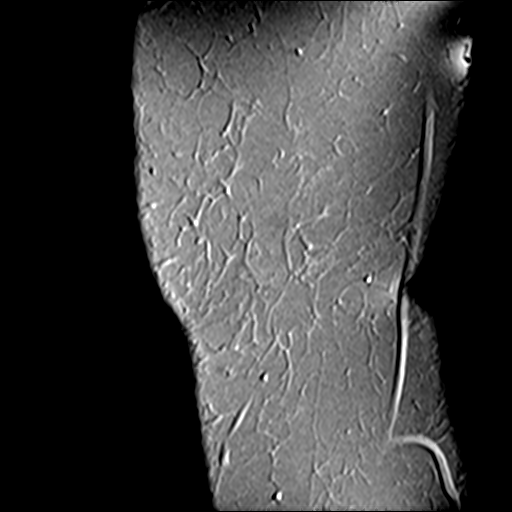
[im 5/27]
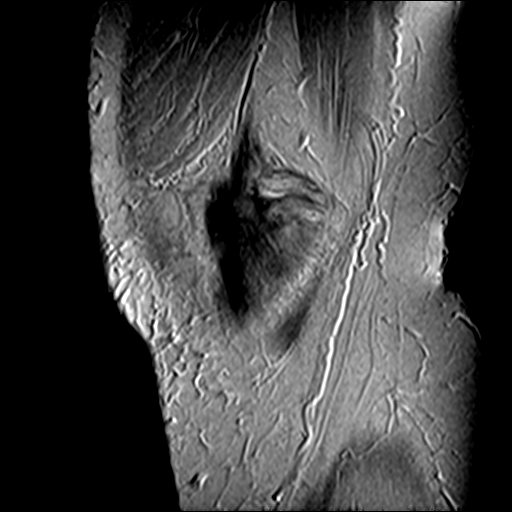
[im 9/27]
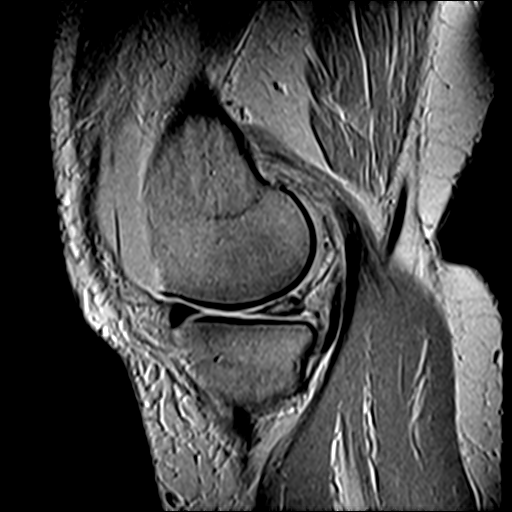
[im 14/27]
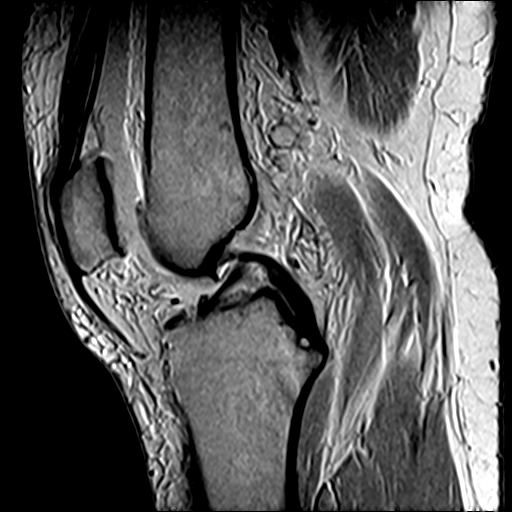
[im 18/27]
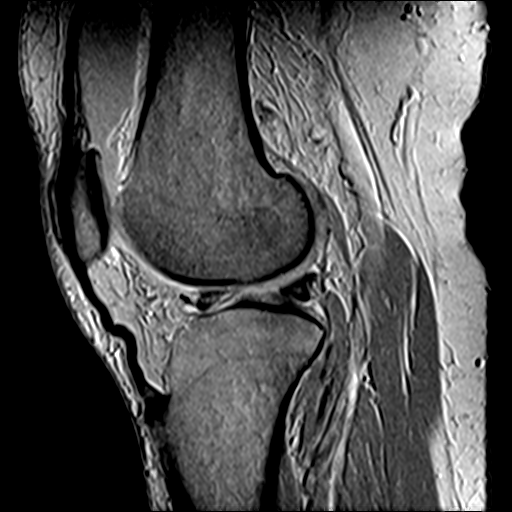
[im 22/27]
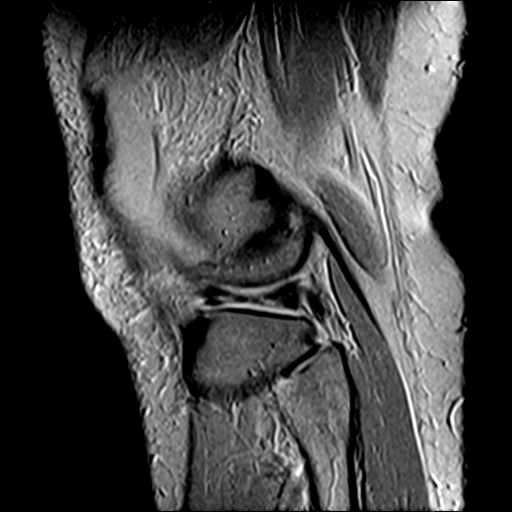
[im 27/27]
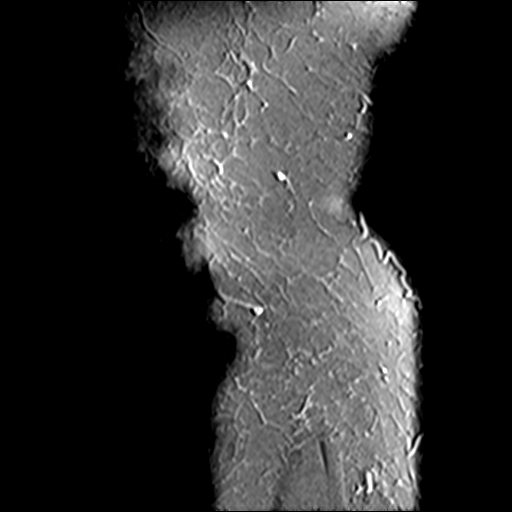

[Series 8: PD fat-sat · coronal · 3.0mm · 0.29mm/px · 7 of 28 slices shown (2 of 2)]
[im 1/28]
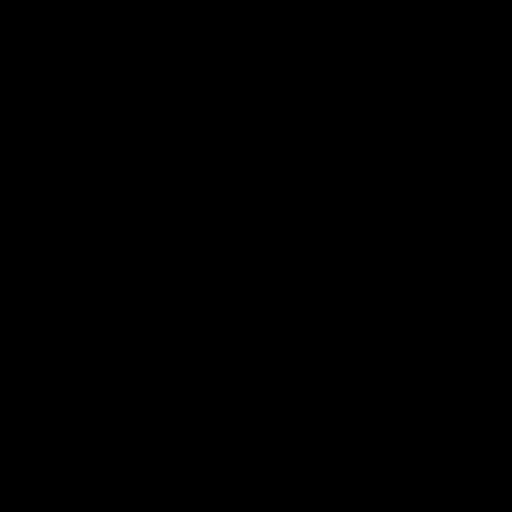
[im 4/28]
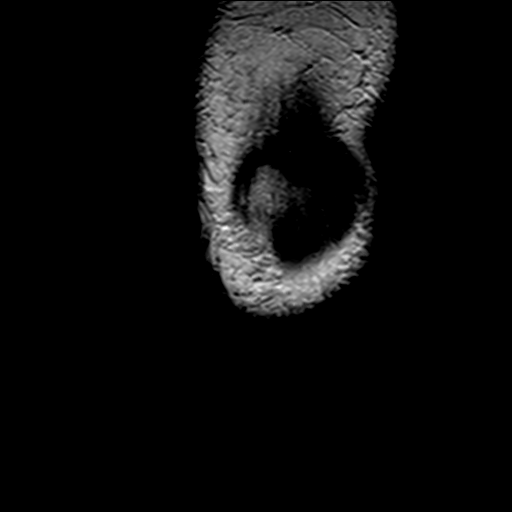
[im 8/28]
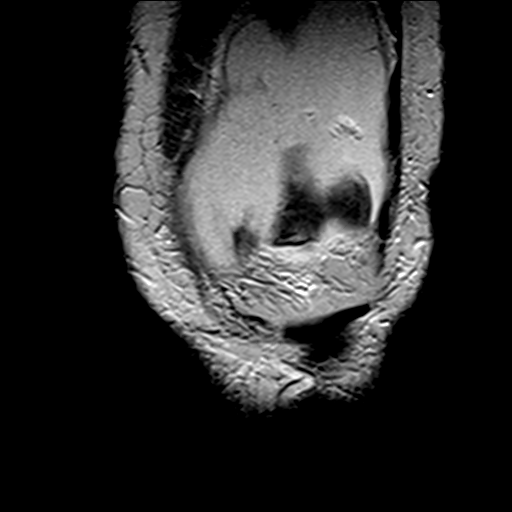
[im 12/28]
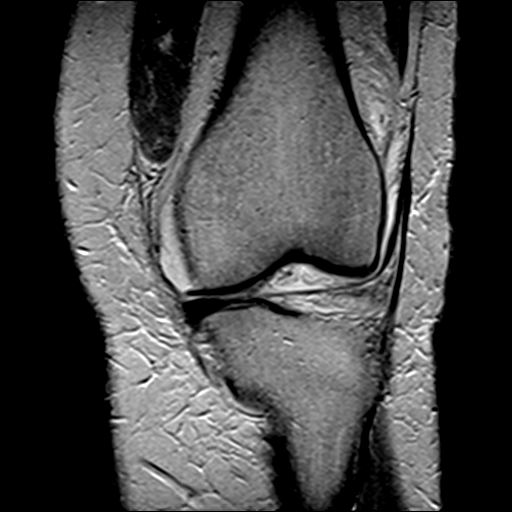
[im 16/28]
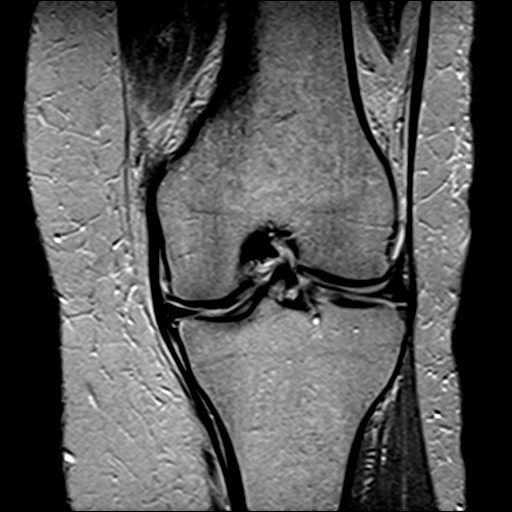
[im 20/28]
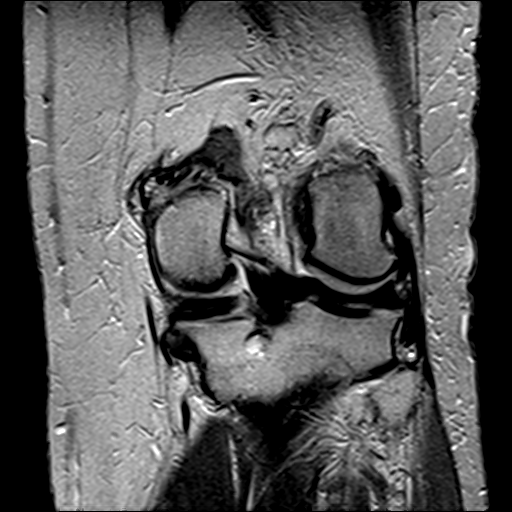
[im 24/28]
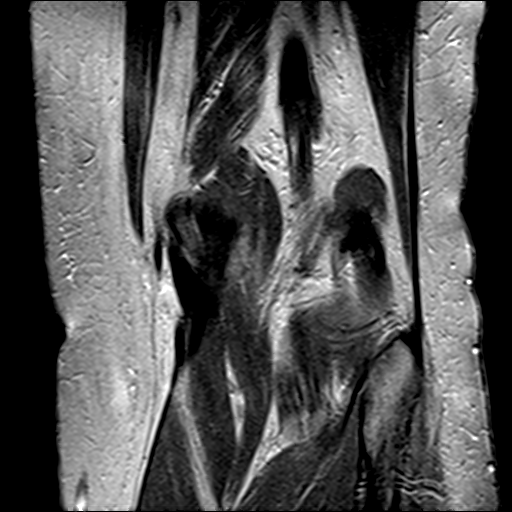

[20 of 40 positions shown; findings below may reference images not displayed]

FINDINGS: Technical note: Poor fat saturation on all sequences degrades
examination quality and lowers sensitivity for detection of subtle
findings.

MENISCI

Medial meniscus: High-grade radial tear of the medial meniscal
posterior horn near the posterior root attachment site with mild
extrusion of the meniscal body.

Lateral meniscus: Horizontal tear of the lateral meniscal body and
anterior horn with tear closely approximating the anterior root
attachment site.

LIGAMENTS

Cruciates:  Intact ACL and PCL.

Collaterals: Medial collateral ligament is intact. Lateral
collateral ligament complex is intact.

CARTILAGE

Patellofemoral:  Chondral thinning of the lateral trochlea.

Medial: Mild chondral thinning of the weight-bearing medial
compartment.

Lateral:  No chondral defect.

Joint: Moderate-sized knee joint effusion. Fat pads within normal
limits.

Popliteal Fossa:  No Baker cyst. Intact popliteus tendon.

Extensor Mechanism:  Intact quadriceps tendon and patellar tendon.

Bones: Small tricompartmental marginal osteophytes. No fracture. No
bone lesion.

Other: Multiple small cysts at the posterior aspect of the knee,
many of which appear associated with the origin of the medial
gastrocnemius, likely ganglion cysts.
IMPRESSION: 1. High-grade radial tear of the medial meniscal posterior horn near
the posterior root attachment site with mild extrusion of the
meniscal body.
2. Horizontal tear of the lateral meniscal body and anterior horn.
3. Mild tricompartmental osteoarthritis.
4. Moderate-sized knee joint effusion.

## 2022-12-12 ENCOUNTER — Encounter: Payer: Self-pay | Admitting: Orthopaedic Surgery

## 2022-12-12 ENCOUNTER — Ambulatory Visit (INDEPENDENT_AMBULATORY_CARE_PROVIDER_SITE_OTHER): Payer: Medicare Other | Admitting: Physician Assistant

## 2022-12-12 DIAGNOSIS — M1712 Unilateral primary osteoarthritis, left knee: Secondary | ICD-10-CM | POA: Diagnosis not present

## 2022-12-12 NOTE — Progress Notes (Signed)
HPI: Nancy Eaton returns today for left knee concerns.  She states that her massage therapist told her that she has possible Baker's cyst.  She is not really having much pain in the knee.  Given she is someone who underwent a knee arthroscopy early last year.  She was found to have significant cartilage wear in all 3 compartments.  She has had no new injury to the knee.  Review of systems: See HPI otherwise negative  Physical exam: Left knee full extension full flexion.  No instability valgus varus stressing.  Slight edema in the popliteal space consistent with a Baker's cyst.  Otherwise calf supple nontender.  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  Given the fact that she is having no significant pain in the knee would not recommend any type of surgical intervention at this time.  Follow-up as needed.  Questions were encouraged and answered

## 2023-01-26 ENCOUNTER — Encounter: Payer: Self-pay | Admitting: Radiology

## 2024-01-28 ENCOUNTER — Other Ambulatory Visit: Payer: Self-pay

## 2024-01-28 ENCOUNTER — Emergency Department (HOSPITAL_BASED_OUTPATIENT_CLINIC_OR_DEPARTMENT_OTHER)

## 2024-01-28 ENCOUNTER — Encounter (HOSPITAL_BASED_OUTPATIENT_CLINIC_OR_DEPARTMENT_OTHER): Payer: Self-pay | Admitting: Urology

## 2024-01-28 ENCOUNTER — Emergency Department (HOSPITAL_BASED_OUTPATIENT_CLINIC_OR_DEPARTMENT_OTHER)
Admission: EM | Admit: 2024-01-28 | Discharge: 2024-01-28 | Disposition: A | Discharge status: Home / Self Care | Attending: Emergency Medicine | Admitting: Emergency Medicine

## 2024-01-28 DIAGNOSIS — K921 Melena: Secondary | ICD-10-CM | POA: Insufficient documentation

## 2024-01-28 DIAGNOSIS — Z7982 Long term (current) use of aspirin: Secondary | ICD-10-CM | POA: Diagnosis not present

## 2024-01-28 DIAGNOSIS — K625 Hemorrhage of anus and rectum: Secondary | ICD-10-CM | POA: Diagnosis present

## 2024-01-28 DIAGNOSIS — R1084 Generalized abdominal pain: Secondary | ICD-10-CM | POA: Insufficient documentation

## 2024-01-28 DIAGNOSIS — K769 Liver disease, unspecified: Secondary | ICD-10-CM | POA: Diagnosis not present

## 2024-01-28 LAB — COMPREHENSIVE METABOLIC PANEL
ALT: 17 U/L (ref 0–44)
AST: 20 U/L (ref 15–41)
Albumin: 3.5 g/dL (ref 3.5–5.0)
Alkaline Phosphatase: 77 U/L (ref 38–126)
Anion gap: 8 (ref 5–15)
BUN: 8 mg/dL (ref 8–23)
CO2: 24 mmol/L (ref 22–32)
Calcium: 8.7 mg/dL — ABNORMAL LOW (ref 8.9–10.3)
Chloride: 101 mmol/L (ref 98–111)
Creatinine, Ser: 0.79 mg/dL (ref 0.44–1.00)
GFR, Estimated: >60 mL/min (ref >60)
Glucose, Bld: 106 mg/dL — ABNORMAL HIGH (ref 70–99)
Potassium: 3.9 mmol/L (ref 3.5–5.1)
Sodium: 133 mmol/L — ABNORMAL LOW (ref 135–145)
Total Bilirubin: 0.7 mg/dL (ref 0.0–1.2)
Total Protein: 7.4 g/dL (ref 6.5–8.1)

## 2024-01-28 LAB — CBC
HCT: 36 % (ref 36.0–46.0)
Hemoglobin: 11.8 g/dL — ABNORMAL LOW (ref 12.0–15.0)
MCH: 28.7 pg (ref 26.0–34.0)
MCHC: 32.8 g/dL (ref 30.0–36.0)
MCV: 87.6 fL (ref 80.0–100.0)
Platelets: 377 10*3/uL (ref 150–400)
RBC: 4.11 MIL/uL (ref 3.87–5.11)
RDW: 13.5 % (ref 11.5–15.5)
WBC: 18.7 10*3/uL — ABNORMAL HIGH (ref 4.0–10.5)
nRBC: 0 % (ref 0.0–0.2)

## 2024-01-28 LAB — C DIFFICILE QUICK SCREEN W PCR REFLEX
C Diff antigen: NEGATIVE
C Diff interpretation: NOT DETECTED
C Diff toxin: NEGATIVE

## 2024-01-28 LAB — OCCULT BLOOD X 1 CARD TO LAB, STOOL: Fecal Occult Bld: POSITIVE — AB

## 2024-01-28 LAB — ABO/RH: ABO/RH(D): O NEG

## 2024-01-28 MED ORDER — AZITHROMYCIN 500 MG PO TABS: 500.0000 mg | ORAL_TABLET | Freq: Every day | ORAL | 0 refills | Status: DC

## 2024-01-28 MED ORDER — AZITHROMYCIN 250 MG PO TABS: 1000.0000 mg | ORAL_TABLET | Freq: Once | ORAL | Status: DC

## 2024-01-28 MED ORDER — IOHEXOL 300 MG/ML  SOLN
100.0000 mL | Freq: Once | INTRAMUSCULAR | Status: AC | PRN
  Administered 2024-01-28: 100 mL via INTRAVENOUS

## 2024-01-28 MED ORDER — AZITHROMYCIN 250 MG PO TABS
500.0000 mg | ORAL_TABLET | Freq: Once | ORAL | Status: AC
  Administered 2024-01-28: 500 mg via ORAL
  Filled 2024-01-28: qty 2

## 2024-01-28 NOTE — ED Provider Notes (Signed)
 [place] [facility]PART[facility] AT [facility] Provider Note   CSN: [id number] Arrival date & time: 01/28/24  1520     History  Chief Complaint  Patient presents with   Abdominal Pain   Rectal Bleeding    Nancy Eaton is a 69 y.o. female with hx of HLD, diverticulosis, who presents to the ER complaining of abdominal pain and bright red blood per rectum. Has been experiencing intermittent rectal bleeding x 2-3 months. Previously saw her PCP a few weeks ago and was referred to GI, has follow up appointment in 3 days. Started having abdominal pain and pain with eating.  Patient works as a Interior and spatial designer, and states that she frequently has to leave her complaints to use the bathroom.  She is having worsening pain and "squirts of blood" every time that she eats something, so she has been eating very little.  She was worried about waiting until her GI appointment later this week.   Abdominal Pain Associated symptoms: hematochezia   Rectal Bleeding Associated symptoms: abdominal pain        Home Medications Prior to Admission medications   Medication Sig Start Date End Date Taking? Authorizing Provider  azithromycin (ZITHROMAX) 500 MG tablet Take 1 tablet (500 mg total) by mouth daily for 5 days. Take first 2 tablets together, then 1 every day until finished. 01/28/24 02/02/24 Yes Esley Brooking T, PA-C  HYDROcodone-acetaminophen (NORCO/VICODIN) 5-325 MG tablet Take 1-2 tablets by mouth every 6 (six) hours as needed for moderate pain. Patient not taking: Reported on 01/27/2022 12/23/21   Kathryne Hitch, MD  acetaminophen (TYLENOL) 500 MG tablet Take 500 mg by mouth at bedtime.    [provider]  aspirin 81 MG chewable tablet Chew 1 tablet (81 mg total) by mouth 2 (two) times daily. 07/18/20   Kathryne Hitch, MD  atorvastatin (LIPITOR) 40 MG tablet Take 1 tablet (40 mg total) by mouth daily. 02/25/19   McGowen, Maryjean Morn, MD  Calcium Polycarbophil  (FIBER-CAPS PO) Take 5 g by mouth in the morning and at bedtime.    [provider]  CALCIUM-VITAMIN D PO Take 2 tablets by mouth daily.    [provider]  COLLAGEN PO Take 6,000 mg by mouth 4 (four) times daily. Biotin    [provider]  Glucosamine HCl-MSM (GLUCOSAMINE-MSM PO) Take 1 tablet by mouth daily.    [provider]  ibuprofen (ADVIL) 200 MG tablet Take 200 mg by mouth every 6 (six) hours as needed for headache or moderate pain.    [provider]  methocarbamol (ROBAXIN) 500 MG tablet Take 1 tablet (500 mg total) by mouth every 6 (six) hours as needed for muscle spasms. 07/18/20   Kathryne Hitch, MD  Multiple Vitamins-Minerals (MULTIVITAMIN WITH MINERALS) tablet Take 1 tablet by mouth daily. Women 50+    [provider]  Multiple Vitamins-Minerals (PRESERVISION AREDS 2) CAPS Take 1 tablet by mouth daily.    [provider]  Omega-3 Fatty Acids (FISH OIL PO) Take 1,400 mg by mouth daily. Omega3 980 mg    [provider]  Turmeric 500 MG CAPS Take 1,500 mg by mouth in the morning, at noon, and at bedtime.     [provider]      Allergies    Penicillins    Review of Systems   Review of Systems  Gastrointestinal:  Positive for abdominal pain, blood in stool and hematochezia.  All other systems reviewed and are negative.  Physical Exam Updated Vital Signs BP 132/67   Pulse 81   Temp 98.1 F (36.7 C) (Oral)   Resp 20   Ht 5\' 3"  (1.6 m)   Wt 83.9 kg   SpO2 97%   BMI 32.77 kg/m  Physical Exam Vitals and nursing note reviewed. Exam conducted with a chaperone present.  Constitutional:      Appearance: Normal appearance.  HENT:     Head: Normocephalic and atraumatic.  Eyes:     Conjunctiva/sclera: Conjunctivae normal.  Cardiovascular:     Rate and Rhythm: Normal rate and regular rhythm.  Pulmonary:     Effort: Pulmonary effort is normal. No respiratory distress.     Breath  sounds: Normal breath sounds.  Abdominal:     General: There is no distension.     Palpations: Abdomen is soft.     Tenderness: There is no abdominal tenderness.  Genitourinary:    Rectum: Guaiac result positive. No tenderness, external hemorrhoid or internal hemorrhoid.     Comments: Small amount of bright pink blood on digital rectal exam Skin:    General: Skin is warm and dry.  Neurological:     General: No focal deficit present.     Mental Status: She is alert.     ED Results / Procedures / Treatments   Labs (all labs ordered are listed, but only abnormal results are displayed) Labs Reviewed  COMPREHENSIVE METABOLIC PANEL - Abnormal; Notable for the following components:      Result Value   Sodium 133 (*)    Glucose, Bld 106 (*)    Calcium 8.7 (*)    All other components within normal limits  CBC - Abnormal; Notable for the following components:   WBC 18.7 (*)    Hemoglobin 11.8 (*)    All other components within normal limits  OCCULT BLOOD X 1 CARD TO LAB, STOOL - Abnormal; Notable for the following components:   Fecal Occult Bld POSITIVE (*)    All other components within normal limits  GASTROINTESTINAL PANEL BY PCR, STOOL (REPLACES STOOL CULTURE)  C DIFFICILE QUICK SCREEN W PCR REFLEX    ABO/RH    EKG None  Radiology CT ABDOMEN PELVIS W CONTRAST Result Date: 01/28/2024 CLINICAL DATA:  Acute abdominal pain.  Bright red blood per rectum. EXAM: CT ABDOMEN AND PELVIS WITH CONTRAST TECHNIQUE: Multidetector CT imaging of the abdomen and pelvis was performed using the standard protocol following bolus administration of intravenous contrast. RADIATION DOSE REDUCTION: This exam was performed according to the departmental dose-optimization program which includes automated exposure control, adjustment of the mA and/or kV according to patient size and/or use of iterative reconstruction technique. CONTRAST:  OMNIPAQUE IOHEXOL 300 MG/ML  SOLN COMPARISON:  None Available.  FINDINGS: Lower chest: No acute abnormality. Hepatobiliary: There is a 2.3 x 2.1 cm hypodense area in the right lobe of the liver with slightly lobulated border measuring 28 Hounsfield units. Otherwise, the liver is within normal limits. Gallbladder and bile ducts are within normal limits. Pancreas: Unremarkable. No pancreatic ductal dilatation or surrounding inflammatory changes. Spleen: Normal in size without focal abnormality. Adrenals/Urinary Tract: Adrenal glands are unremarkable. Kidneys are normal, without renal calculi, focal lesion, or hydronephrosis. Bladder is unremarkable. Stomach/Bowel: There circumferential wall thickening and inflammation involving the descending colon, sigmoid colon and rectum. There are multiple small pericolonic lymph nodes. There is no perforation or abscess. There is no bowel obstruction, but the colon proximal to this level is distended with a large amount of  stool. Appendix is not well seen. Small bowel and stomach are within normal limits. Vascular/Lymphatic: Aortic atherosclerosis. No enlarged abdominal or pelvic lymph nodes. Reproductive: Uterus and bilateral adnexa are unremarkable. Other: There is a small fat containing umbilical hernia. There is no ascites. Musculoskeletal: Degenerative changes affect the spine. IMPRESSION: 1. Circumferential wall thickening and inflammation involving the descending colon, sigmoid colon and rectum compatible with infectious/inflammatory colitis. No perforation or abscess. 2. The colon proximal to this level is distended with a large amount of stool. 3. 2.3 cm hypodense area in the right lobe of the liver with slightly lobulated border. This is indeterminate. Further evaluation with ultrasound recommended. 4. Aortic atherosclerosis. Aortic Atherosclerosis (ICD10-I70.0). Electronically Signed   By: Darliss Cheney M.D.   On: 01/28/2024 17:33    Procedures Procedures    Medications Ordered in ED Medications  iohexol (OMNIPAQUE) 300  MG/ML solution 100 mL (100 mLs Intravenous Contrast Given 01/28/24 1705)    ED Course/ Medical Decision Making/ A&P                                 Medical Decision Making Amount and/or Complexity of Data Reviewed Labs: ordered.  This patient is a 68 y.o. female  who presents to the ED for concern of blood red blood per rectum and abdominal pain.   Differential diagnoses prior to evaluation: The emergent differential diagnosis includes, but is not limited to,  Diverticulitis, IBD, colitis, mesenteric ischemia, colorectal cancer / polyps, hemorrhoids, rectal foreign body, anal fissure. This is not an exhaustive differential.   Past Medical History / Co-morbidities / Social History: HLD, diverticulosis  Additional history: Chart reviewed. Pertinent results include: Reviewed PCP visit on 2/17  Physical Exam: Physical exam performed. The pertinent findings include: Mildly hypertensive, otherwise normal vital signs.  No acute distress.  Rectal exam negative for hemorrhoids, positive for pink blood on digital exam, positive Hemoccult.  Lab Tests/Imaging studies: I personally interpreted labs/imaging and the pertinent results include: WBC 18.7, hemoglobin stable at 11.8.  CMP grossly unremarkable.  Hemoccult positive.  Stool studies pending.  CT abdomen pelvis with circumferential wall thickening and inflammation involving descending colon, sigmoid colon, and rectum compatible with infectious/inflammatory colitis.  No abscess.  I agree with the radiologist interpretation.  Disposition: After consideration of the diagnostic results and the patients response to treatment, I feel that emergency department workup does not suggest an emergent condition requiring admission or immediate intervention beyond what has been performed at this time. The plan is: Discharge to home.  Overall patient's clinically very well-appearing, with reassuring laboratory evaluation.  CT scan consistent with colitis, and  I did express to the patient that I have some concern for inflammatory colitis.  Will prescribe antibiotics in case there is a infectious component.  She is provided stool studies, and encouraged her to follow-up the results with the GI doctor when she sees them later this week. The patient is safe for discharge and has been instructed to return immediately for worsening symptoms, change in symptoms or any other concerns.  Final Clinical Impression(s) / ED Diagnoses Final diagnoses:  Hematochezia  Generalized abdominal pain  Liver lesion    Rx / DC Orders ED Discharge Orders          Ordered    azithromycin (ZITHROMAX) 500 MG tablet  Daily        01/28/24 1821  Portions of this report may have been transcribed using voice recognition software. Every effort was made to ensure accuracy; however, inadvertent computerized transcription errors may be present.    Su Monks, PA-C 01/28/24 1824    Rozelle Logan, DO 01/28/24 2057

## 2024-01-28 NOTE — ED Triage Notes (Signed)
 Pt states bright red bleeding when have BM x 2-3 months but getting worse  Was seen at PCP and has follow up with GI on Wednesday  States generalized abd pain and pain with eating

## 2024-01-28 NOTE — Discharge Instructions (Addendum)
 You were seen in the emergency department today for abdominal pain and rectal bleeding.  Your CT scan showed that you have colitis.  We discussed that there could be many causes of this.  We collected stool samples, and you should be able to review the results with the gastroenterologist.  I have started you on a course of some antibiotics in case there is a bacterial overgrowth.  Continue to monitor how you are doing and return to the ER for new or worsening symptoms such as large volume of bleeding more than you have previously been experiencing, severe pain, or if you feel like you are going to pass out.  Also --your CT scan did show a small liver lesion, that the radiologist recommended you have a follow-up ultrasound on.  Please discuss this with the gastroenterologist and your primary doctor.

## 2024-01-29 LAB — GASTROINTESTINAL PANEL BY PCR, STOOL (REPLACES STOOL CULTURE)

## 2024-02-01 ENCOUNTER — Observation Stay (HOSPITAL_COMMUNITY)

## 2024-02-01 ENCOUNTER — Encounter (HOSPITAL_COMMUNITY): Payer: Self-pay

## 2024-02-01 ENCOUNTER — Other Ambulatory Visit: Payer: Self-pay

## 2024-02-01 ENCOUNTER — Inpatient Hospital Stay (HOSPITAL_COMMUNITY)
Admission: EM | Admit: 2024-02-01 | Discharge: 2024-02-05 | DRG: 386 | Disposition: A | Discharge status: Home / Self Care | Attending: Family Medicine | Admitting: Family Medicine

## 2024-02-01 ENCOUNTER — Emergency Department (HOSPITAL_COMMUNITY)

## 2024-02-01 DIAGNOSIS — K5641 Fecal impaction: Secondary | ICD-10-CM

## 2024-02-01 DIAGNOSIS — Z808 Family history of malignant neoplasm of other organs or systems: Secondary | ICD-10-CM

## 2024-02-01 DIAGNOSIS — K51511 Left sided colitis with rectal bleeding: Secondary | ICD-10-CM | POA: Diagnosis not present

## 2024-02-01 DIAGNOSIS — I10 Essential (primary) hypertension: Secondary | ICD-10-CM | POA: Diagnosis present

## 2024-02-01 DIAGNOSIS — R1084 Generalized abdominal pain: Secondary | ICD-10-CM | POA: Diagnosis not present

## 2024-02-01 DIAGNOSIS — Z96651 Presence of right artificial knee joint: Secondary | ICD-10-CM | POA: Diagnosis present

## 2024-02-01 DIAGNOSIS — Z79899 Other long term (current) drug therapy: Secondary | ICD-10-CM

## 2024-02-01 DIAGNOSIS — Z880 Allergy status to penicillin: Secondary | ICD-10-CM

## 2024-02-01 DIAGNOSIS — Z818 Family history of other mental and behavioral disorders: Secondary | ICD-10-CM

## 2024-02-01 DIAGNOSIS — Z825 Family history of asthma and other chronic lower respiratory diseases: Secondary | ICD-10-CM

## 2024-02-01 DIAGNOSIS — D500 Iron deficiency anemia secondary to blood loss (chronic): Secondary | ICD-10-CM | POA: Diagnosis present

## 2024-02-01 DIAGNOSIS — Z811 Family history of alcohol abuse and dependence: Secondary | ICD-10-CM

## 2024-02-01 DIAGNOSIS — D1800 Hemangioma unspecified site: Secondary | ICD-10-CM | POA: Diagnosis present

## 2024-02-01 DIAGNOSIS — Z888 Allergy status to other drugs, medicaments and biological substances status: Secondary | ICD-10-CM

## 2024-02-01 DIAGNOSIS — R948 Abnormal results of function studies of other organs and systems: Secondary | ICD-10-CM | POA: Diagnosis present

## 2024-02-01 DIAGNOSIS — T380X5A Adverse effect of glucocorticoids and synthetic analogues, initial encounter: Secondary | ICD-10-CM | POA: Diagnosis present

## 2024-02-01 DIAGNOSIS — K769 Liver disease, unspecified: Secondary | ICD-10-CM | POA: Diagnosis present

## 2024-02-01 DIAGNOSIS — K5900 Constipation, unspecified: Secondary | ICD-10-CM | POA: Diagnosis present

## 2024-02-01 DIAGNOSIS — Z8249 Family history of ischemic heart disease and other diseases of the circulatory system: Secondary | ICD-10-CM

## 2024-02-01 DIAGNOSIS — E861 Hypovolemia: Secondary | ICD-10-CM | POA: Diagnosis present

## 2024-02-01 DIAGNOSIS — K5190 Ulcerative colitis, unspecified, without complications: Secondary | ICD-10-CM | POA: Diagnosis present

## 2024-02-01 DIAGNOSIS — E785 Hyperlipidemia, unspecified: Secondary | ICD-10-CM | POA: Diagnosis present

## 2024-02-01 DIAGNOSIS — E871 Hypo-osmolality and hyponatremia: Secondary | ICD-10-CM | POA: Diagnosis present

## 2024-02-01 DIAGNOSIS — Z7982 Long term (current) use of aspirin: Secondary | ICD-10-CM

## 2024-02-01 LAB — CBC WITH DIFFERENTIAL/PLATELET
Abs Immature Granulocytes: 0.53 10*3/uL — ABNORMAL HIGH (ref 0.00–0.07)
Basophils Absolute: 0.2 10*3/uL — ABNORMAL HIGH (ref 0.0–0.1)
Basophils Relative: 1 %
Eosinophils Absolute: 0 10*3/uL (ref 0.0–0.5)
Eosinophils Relative: 0 %
HCT: 36.2 % (ref 36.0–46.0)
Hemoglobin: 11.8 g/dL — ABNORMAL LOW (ref 12.0–15.0)
Immature Granulocytes: 2 %
Lymphocytes Relative: 6 %
Lymphs Abs: 1.8 10*3/uL (ref 0.7–4.0)
MCH: 28.9 pg (ref 26.0–34.0)
MCHC: 32.6 g/dL (ref 30.0–36.0)
MCV: 88.5 fL (ref 80.0–100.0)
Monocytes Absolute: 3.1 10*3/uL — ABNORMAL HIGH (ref 0.1–1.0)
Monocytes Relative: 10 %
Neutro Abs: 24.1 10*3/uL — ABNORMAL HIGH (ref 1.7–7.7)
Neutrophils Relative %: 81 %
Platelets: 436 10*3/uL — ABNORMAL HIGH (ref 150–400)
RBC: 4.09 MIL/uL (ref 3.87–5.11)
RDW: 13.5 % (ref 11.5–15.5)
WBC: 29.8 10*3/uL — ABNORMAL HIGH (ref 4.0–10.5)
nRBC: 0 % (ref 0.0–0.2)

## 2024-02-01 LAB — COMPREHENSIVE METABOLIC PANEL
ALT: 19 U/L (ref 0–44)
AST: 16 U/L (ref 15–41)
Albumin: 3.1 g/dL — ABNORMAL LOW (ref 3.5–5.0)
Alkaline Phosphatase: 85 U/L (ref 38–126)
Anion gap: 12 (ref 5–15)
BUN: 12 mg/dL (ref 8–23)
CO2: 21 mmol/L — ABNORMAL LOW (ref 22–32)
Calcium: 8.4 mg/dL — ABNORMAL LOW (ref 8.9–10.3)
Chloride: 96 mmol/L — ABNORMAL LOW (ref 98–111)
Creatinine, Ser: 0.66 mg/dL (ref 0.44–1.00)
GFR, Estimated: >60 mL/min (ref >60)
Glucose, Bld: 113 mg/dL — ABNORMAL HIGH (ref 70–99)
Potassium: 3.9 mmol/L (ref 3.5–5.1)
Sodium: 129 mmol/L — ABNORMAL LOW (ref 135–145)
Total Bilirubin: 1 mg/dL (ref 0.0–1.2)
Total Protein: 7.2 g/dL (ref 6.5–8.1)

## 2024-02-01 LAB — LIPASE, BLOOD: Lipase: 20 U/L (ref 11–51)

## 2024-02-01 LAB — MAGNESIUM: Magnesium: 2.1 mg/dL (ref 1.7–2.4)

## 2024-02-01 MED ORDER — FLEET ENEMA RE ENEM: 2.0000 | ENEMA | Freq: Once | RECTAL | Status: DC

## 2024-02-01 MED ORDER — ALBUTEROL SULFATE (2.5 MG/3ML) 0.083% IN NEBU: 2.5000 mg | INHALATION_SOLUTION | RESPIRATORY_TRACT | Status: DC | PRN

## 2024-02-01 MED ORDER — ONDANSETRON HCL 4 MG/2ML IJ SOLN
4.0000 mg | Freq: Once | INTRAMUSCULAR | Status: AC
  Administered 2024-02-01: 4 mg via INTRAVENOUS
  Filled 2024-02-01: qty 2

## 2024-02-01 MED ORDER — ACETAMINOPHEN 325 MG PO TABS: 650.0000 mg | ORAL_TABLET | Freq: Four times a day (QID) | ORAL | Status: DC | PRN

## 2024-02-01 MED ORDER — SODIUM CHLORIDE 0.9 % IV SOLN
2.0000 g | Freq: Once | INTRAVENOUS | Status: AC
  Administered 2024-02-01: 2 g via INTRAVENOUS
  Filled 2024-02-01: qty 12.5

## 2024-02-01 MED ORDER — METRONIDAZOLE 500 MG/100ML IV SOLN: 500.0000 mg | Freq: Once | INTRAVENOUS | Status: DC

## 2024-02-01 MED ORDER — SODIUM CHLORIDE 0.9 % IV BOLUS
1000.0000 mL | Freq: Once | INTRAVENOUS | Status: AC
  Administered 2024-02-01: 1000 mL via INTRAVENOUS

## 2024-02-01 MED ORDER — METRONIDAZOLE 500 MG/100ML IV SOLN
500.0000 mg | Freq: Two times a day (BID) | INTRAVENOUS | Status: DC
  Administered 2024-02-01 – 2024-02-05 (×9): 500 mg via INTRAVENOUS
  Filled 2024-02-01 (×9): qty 100

## 2024-02-01 MED ORDER — ENOXAPARIN SODIUM 40 MG/0.4ML IJ SOSY
40.0000 mg | PREFILLED_SYRINGE | INTRAMUSCULAR | Status: DC
  Administered 2024-02-01 – 2024-02-04 (×4): 40 mg via SUBCUTANEOUS
  Filled 2024-02-01 (×4): qty 0.4

## 2024-02-01 MED ORDER — FLEET ENEMA RE ENEM
2.0000 | ENEMA | Freq: Once | RECTAL | Status: AC
  Administered 2024-02-02: 1 via RECTAL

## 2024-02-01 MED ORDER — LEVOFLOXACIN IN D5W 750 MG/150ML IV SOLN
750.0000 mg | INTRAVENOUS | Status: DC
  Administered 2024-02-01 – 2024-02-05 (×5): 750 mg via INTRAVENOUS
  Filled 2024-02-01 (×5): qty 150

## 2024-02-01 MED ORDER — IOHEXOL 9 MG/ML PO SOLN: 500.0000 mL | ORAL | Status: AC

## 2024-02-01 MED ORDER — SODIUM CHLORIDE 0.9 % IV SOLN: INTRAVENOUS | Status: AC

## 2024-02-01 MED ORDER — IOHEXOL 300 MG/ML  SOLN
100.0000 mL | Freq: Once | INTRAMUSCULAR | Status: AC | PRN
  Administered 2024-02-01: 100 mL via INTRAVENOUS

## 2024-02-01 MED ORDER — MAGNESIUM CITRATE PO SOLN: 1.0000 | Freq: Once | ORAL | Status: DC | PRN

## 2024-02-01 MED ORDER — ONDANSETRON HCL 4 MG PO TABS: 4.0000 mg | ORAL_TABLET | Freq: Four times a day (QID) | ORAL | Status: DC | PRN

## 2024-02-01 MED ORDER — ACETAMINOPHEN 650 MG RE SUPP: 650.0000 mg | Freq: Four times a day (QID) | RECTAL | Status: DC | PRN

## 2024-02-01 MED ORDER — ONDANSETRON HCL 4 MG/2ML IJ SOLN
4.0000 mg | Freq: Four times a day (QID) | INTRAMUSCULAR | Status: DC | PRN
Start: 2024-02-01 — End: 2024-02-05

## 2024-02-01 NOTE — H&P (View-Only) (Signed)
 Reason for Consult: Colitis Referring Physician: [facility]  [place] HPI: This is a 68 year old female with a PMH of HTN and hyperlipidemia admitted for mucus with hematochezia and constipation.  Her symptoms started acutely 3-4 weeks ago.  She was in her baseline state of health at that time, but then she started to notice an inability to have a bowel movement.  The patient did not respond over-the-counter laxatives.  Even though she did not pass any bowel movements, she was passing bloody mucus.  Her symptoms drove her to present to [facility] and her CT abdomen showed a left-sided colitis and a significant amount of stool proximal to this area.  Antibiotics were prescribed and she was told to follow up with her previously scheduled GI appointment.  She saw her primary GI yesterday and she was instructed to use 17 g of Miralax x 7.  Unfortunately she vomited and had very little effect with the Miralax.  The plan was for her to undergo a colonoscopy next week.  Her prior colonoscopy was 5 years ago and some polyps were identified.  The patient denies any recent antibiotics use 1-2 months prior or any known sick contacts.  There is no known family history of IBD.  At home she did report a fever to 101 F and her WBC is noted to be at 29K.  Her GI pathogen panel this past Sunday showed that she was positive for Rotavirus.  Past Medical History:  Diagnosis Date   Arthritis    Knee   Hyperlipidemia    Lovastatin started approx 2011   IFG (impaired fasting glucose) 2018   HbA1c 5.9% (stable)   PONV (postoperative nausea and vomiting)    Pre-diabetes    Right knee pain 12/2019   End stage: TKA recommended by ortho 01/2020->pt considering   Sigmoid diverticulosis 04/2010   "     "     "    "    Past Surgical History:  Procedure Laterality Date   BREAST ENHANCEMENT SURGERY     As of mammogram 12/2017--rupture of implants stable.   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 She has never used smokeless tobacco. She reports current alcohol use. She reports that she does not use drugs.  Allergies:  Allergies  Allergen Reactions   Penicillins Rash    20 years     Medications: Scheduled:  [START ON 02/02/2024] sodium phosphate  2 enema Rectal Once   Continuous:  metronidazole      Results for orders placed or performed during the hospital encounter of 02/01/24 (from the past 24 hours)  CBC with Differential     Status: Abnormal   Collection Time: 02/01/24 11:07 AM  Result Value Ref Range   WBC 29.8 (H) 4.0 - 10.5 K/uL   RBC 4.09  3.87 - 5.11 MIL/uL   Hemoglobin 11.8 (L) 12.0 - 15.0 g/dL   HCT 14.7 82.9 - 56.2 %   MCV 88.5 80.0 - 100.0 fL   MCH 28.9 26.0 - 34.0 pg   MCHC 32.6 30.0 - 36.0 g/dL   RDW 13.0 86.5 - 78.4 %   Platelets 436 (H) 150 - 400 K/uL   nRBC 0.0 0.0 - 0.2 %   Neutrophils Relative % 81 %   Neutro Abs 24.1 (H) 1.7 - 7.7 K/uL   Lymphocytes Relative 6 %   Lymphs Abs 1.8 0.7 - 4.0 K/uL   Monocytes Relative 10 %   Monocytes Absolute 3.1 (H) 0.1 - 1.0 K/uL   Eosinophils Relative 0 %   Eosinophils Absolute 0.0 0.0 - 0.5 K/uL   Basophils Relative 1 %   Basophils Absolute 0.2 (H) 0.0 - 0.1 K/uL   WBC Morphology TOXIC GRANULATION    Immature Granulocytes 2 %   Abs Immature Granulocytes 0.53 (H) 0.00 - 0.07 K/uL   Reactive, Benign Lymphocytes PRESENT   Lipase, blood     Status: None   Collection Time: 02/01/24 11:07 AM  Result Value Ref Range    Lipase 20 11 - 51 U/L  Comprehensive metabolic panel     Status: Abnormal   Collection Time: 02/01/24 11:07 AM  Result Value Ref Range   Sodium 129 (L) 135 - 145 mmol/L   Potassium 3.9 3.5 - 5.1 mmol/L   Chloride 96 (L) 98 - 111 mmol/L   CO2 21 (L) 22 - 32 mmol/L   Glucose, Bld 113 (H) 70 - 99 mg/dL   BUN 12 8 - 23 mg/dL   Creatinine, Ser 6.96 0.44 - 1.00 mg/dL   Calcium 8.4 (L) 8.9 - 10.3 mg/dL   Total Protein 7.2 6.5 - 8.1 g/dL   Albumin 3.1 (L) 3.5 - 5.0 g/dL   AST 16 15 - 41 U/L   ALT 19 0 - 44 U/L   Alkaline Phosphatase 85 38 - 126 U/L   Total Bilirubin 1.0 0.0 - 1.2 mg/dL   GFR, Estimated >29 >52 mL/min   Anion gap 12 5 - 15  Magnesium     Status: None   Collection Time: 02/01/24 11:07 AM  Result Value Ref Range   Magnesium 2.1 1.7 - 2.4 mg/dL     No results found.  ROS:  As stated above in the HPI otherwise negative.  Blood pressure (!) 156/79, pulse 97, temperature 99.8 F (37.7 C), temperature source Oral, resp. rate 18, height 5\' 3"  (1.6 m), weight 84 kg, SpO2 96%.    PE: Gen: NAD, Alert and Oriented HEENT:  [place]/AT, EOMI Lungs: CTA Bilaterally CV: RRR without M/G/R ABD: Soft, NTND, +BS Ext: No C/C/E  Assessment/Plan: 1) Left-sided colitis. 2) Constipation. 3) Leukocytosis. 4) Fever and chills.   Further evaluation for her colitis is necessary with an FFS.  Her failure to tolerate an oral purge makes it very unlikely that she will be able to successfully prep.  Probable the colitis and edema noted on the CT scan is preventing normal passage of stool.  The finding of the Rotavirus with the pathogen panel is difficult to interpret as it does not cause hematochezia with mucus nor a colitis.  The physical examination of her abdomen does not show any acute abdomen and she does not have tenderness.  IBD is a significant consideration.  Plan: 1) Continue with levofloxacin and metronidazole for now. 2) Check fecal calprotectin. 3) FFS with  biopsies  tomorrow.  Latif Nazareno D 02/01/2024, 3:56 PM

## 2024-02-01 NOTE — Consult Note (Signed)
 Reason for Consult: Colitis Referring Physician: [facility]  [place] HPI: This is a 68 year old female with a PMH of HTN and hyperlipidemia admitted for mucus with hematochezia and constipation.  Her symptoms started acutely 3-4 weeks ago.  She was in her baseline state of health at that time, but then she started to notice an inability to have a bowel movement.  The patient did not respond over-the-counter laxatives.  Even though she did not pass any bowel movements, she was passing bloody mucus.  Her symptoms drove her to present to [facility] and her CT abdomen showed a left-sided colitis and a significant amount of stool proximal to this area.  Antibiotics were prescribed and she was told to follow up with her previously scheduled GI appointment.  She saw her primary GI yesterday and she was instructed to use 17 g of Miralax x 7.  Unfortunately she vomited and had very little effect with the Miralax.  The plan was for her to undergo a colonoscopy next week.  Her prior colonoscopy was 5 years ago and some polyps were identified.  The patient denies any recent antibiotics use 1-2 months prior or any known sick contacts.  There is no known family history of IBD.  At home she did report a fever to 101 F and her WBC is noted to be at 29K.  Her GI pathogen panel this past Sunday showed that she was positive for Rotavirus.  Past Medical History:  Diagnosis Date   Arthritis    Knee   Hyperlipidemia    Lovastatin started approx 2011   IFG (impaired fasting glucose) 2018   HbA1c 5.9% (stable)   PONV (postoperative nausea and vomiting)    Pre-diabetes    Right knee pain 12/2019   End stage: TKA recommended by ortho 01/2020->pt considering   Sigmoid diverticulosis 04/2010   "     "     "    "    Past Surgical History:  Procedure Laterality Date   BREAST ENHANCEMENT SURGERY     As of mammogram 12/2017--rupture of implants stable.   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 She has never used smokeless tobacco. She reports current alcohol use. She reports that she does not use drugs.  Allergies:  Allergies  Allergen Reactions   Penicillins Rash    20 years     Medications: Scheduled:  [START ON 02/02/2024] sodium phosphate  2 enema Rectal Once   Continuous:  metronidazole      Results for orders placed or performed during the hospital encounter of 02/01/24 (from the past 24 hours)  CBC with Differential     Status: Abnormal   Collection Time: 02/01/24 11:07 AM  Result Value Ref Range   WBC 29.8 (H) 4.0 - 10.5 K/uL   RBC 4.09  3.87 - 5.11 MIL/uL   Hemoglobin 11.8 (L) 12.0 - 15.0 g/dL   HCT 14.7 82.9 - 56.2 %   MCV 88.5 80.0 - 100.0 fL   MCH 28.9 26.0 - 34.0 pg   MCHC 32.6 30.0 - 36.0 g/dL   RDW 13.0 86.5 - 78.4 %   Platelets 436 (H) 150 - 400 K/uL   nRBC 0.0 0.0 - 0.2 %   Neutrophils Relative % 81 %   Neutro Abs 24.1 (H) 1.7 - 7.7 K/uL   Lymphocytes Relative 6 %   Lymphs Abs 1.8 0.7 - 4.0 K/uL   Monocytes Relative 10 %   Monocytes Absolute 3.1 (H) 0.1 - 1.0 K/uL   Eosinophils Relative 0 %   Eosinophils Absolute 0.0 0.0 - 0.5 K/uL   Basophils Relative 1 %   Basophils Absolute 0.2 (H) 0.0 - 0.1 K/uL   WBC Morphology TOXIC GRANULATION    Immature Granulocytes 2 %   Abs Immature Granulocytes 0.53 (H) 0.00 - 0.07 K/uL   Reactive, Benign Lymphocytes PRESENT   Lipase, blood     Status: None   Collection Time: 02/01/24 11:07 AM  Result Value Ref Range    Lipase 20 11 - 51 U/L  Comprehensive metabolic panel     Status: Abnormal   Collection Time: 02/01/24 11:07 AM  Result Value Ref Range   Sodium 129 (L) 135 - 145 mmol/L   Potassium 3.9 3.5 - 5.1 mmol/L   Chloride 96 (L) 98 - 111 mmol/L   CO2 21 (L) 22 - 32 mmol/L   Glucose, Bld 113 (H) 70 - 99 mg/dL   BUN 12 8 - 23 mg/dL   Creatinine, Ser 6.96 0.44 - 1.00 mg/dL   Calcium 8.4 (L) 8.9 - 10.3 mg/dL   Total Protein 7.2 6.5 - 8.1 g/dL   Albumin 3.1 (L) 3.5 - 5.0 g/dL   AST 16 15 - 41 U/L   ALT 19 0 - 44 U/L   Alkaline Phosphatase 85 38 - 126 U/L   Total Bilirubin 1.0 0.0 - 1.2 mg/dL   GFR, Estimated >29 >52 mL/min   Anion gap 12 5 - 15  Magnesium     Status: None   Collection Time: 02/01/24 11:07 AM  Result Value Ref Range   Magnesium 2.1 1.7 - 2.4 mg/dL     No results found.  ROS:  As stated above in the HPI otherwise negative.  Blood pressure (!) 156/79, pulse 97, temperature 99.8 F (37.7 C), temperature source Oral, resp. rate 18, height 5\' 3"  (1.6 m), weight 84 kg, SpO2 96%.    PE: Gen: NAD, Alert and Oriented HEENT:  [place]/AT, EOMI Lungs: CTA Bilaterally CV: RRR without M/G/R ABD: Soft, NTND, +BS Ext: No C/C/E  Assessment/Plan: 1) Left-sided colitis. 2) Constipation. 3) Leukocytosis. 4) Fever and chills.   Further evaluation for her colitis is necessary with an FFS.  Her failure to tolerate an oral purge makes it very unlikely that she will be able to successfully prep.  Probable the colitis and edema noted on the CT scan is preventing normal passage of stool.  The finding of the Rotavirus with the pathogen panel is difficult to interpret as it does not cause hematochezia with mucus nor a colitis.  The physical examination of her abdomen does not show any acute abdomen and she does not have tenderness.  IBD is a significant consideration.  Plan: 1) Continue with levofloxacin and metronidazole for now. 2) Check fecal calprotectin. 3) FFS with  biopsies  tomorrow.  Latif Nazareno D 02/01/2024, 3:56 PM

## 2024-02-01 NOTE — ED Provider Notes (Signed)
 [place] EMERGENCY DEPARTMENT AT [facility] Provider Note   CSN: [id number] Arrival date & time: 02/01/24  6010     History {Add pertinent medical, surgical, social history, OB history to HPI:1} Chief Complaint  Patient presents with   Abdominal Pain   Constipation    Nancy Eaton is a 68 y.o. female presented to ED with fevers, abdominal pain, constipation.  Patient was seen in the emergency department approximately 4 days ago with CT scan showing colitis with stool overload or impaction.  She was given antibiotics in the ER and has been taking MiraLAX at home.  She was seen by a gastroenterologist yesterday, whose office record I was able to review, who recommended EGD to rule out strictures and esophagitis, as well as a bowel cleanout with MiraLAX with a scheduled colonoscopy.  Patient reports he has not been able to keep down the MiraLAX because she has been persistently vomiting which is tries eat or drink anything more than a couple sips of water.  She says she has having some very small passages of mucus or blood but no bowel movement, now for about 3 to 4 weeks per her recollection.  She has tried enemas at home with no relief.  She reports she has cramping abdominal pain specifically when she tries to eat or drink something, but not persistent pain.  She has a new appointment scheduled with [facility] for a 2nd opinion.  HPI     Home Medications Prior to Admission medications   Medication Sig Start Date End Date Taking? Authorizing Provider  HYDROcodone-acetaminophen (NORCO/VICODIN) 5-325 MG tablet Take 1-2 tablets by mouth every 6 (six) hours as needed for moderate pain. Patient not taking: Reported on 01/27/2022 12/23/21   Kathryne Hitch, MD  acetaminophen (TYLENOL) 500 MG tablet Take 500 mg by mouth at bedtime.    [provider]  aspirin 81 MG chewable tablet Chew 1 tablet (81 mg total) by mouth 2 (two) times daily. 07/18/20   Kathryne Hitch, MD  atorvastatin (LIPITOR) 40 MG tablet Take 1 tablet (40 mg total) by mouth daily. 02/25/19   McGowen, Maryjean Morn, MD  azithromycin (ZITHROMAX) 500 MG tablet Take 1 tablet (500 mg total) by mouth daily for 5 days. Take first 2 tablets together, then 1 every day until finished. 01/28/24 02/02/24  Roemhildt, Lorin T, PA-C  Calcium Polycarbophil (FIBER-CAPS PO) Take 5 g by mouth in the morning and at bedtime.    [provider]  CALCIUM-VITAMIN D PO Take 2 tablets by mouth daily.    [provider]  COLLAGEN PO Take 6,000 mg by mouth 4 (four) times daily. Biotin    [provider]  Glucosamine HCl-MSM (GLUCOSAMINE-MSM PO) Take 1 tablet by mouth daily.    [provider]  ibuprofen (ADVIL) 200 MG tablet Take 200 mg by mouth every 6 (six) hours as needed for headache or moderate pain.    [provider]  methocarbamol (ROBAXIN) 500 MG tablet Take 1 tablet (500 mg total) by mouth every 6 (six) hours as needed for muscle spasms. 07/18/20   Kathryne Hitch, MD  Multiple Vitamins-Minerals (MULTIVITAMIN WITH MINERALS) tablet Take 1 tablet by mouth daily. Women 50+    [provider]  Multiple Vitamins-Minerals (PRESERVISION AREDS 2) CAPS Take 1 tablet by mouth daily.    [provider]  Omega-3 Fatty Acids (FISH OIL PO) Take 1,400 mg by mouth daily. Omega3 980 mg    [provider]  Turmeric 500 MG CAPS Take 1,500 mg by mouth in the morning, at noon, and at bedtime.     [provider]      Allergies    Penicillins    Review of Systems   Review of Systems  Physical Exam Updated Vital Signs BP 138/71 (BP Location: Left Arm)   Pulse 89   Temp 100 F (37.8 C) (Oral)   Resp 16   Ht 5\' 3"  (1.6 m)   Wt 84 kg   SpO2 98%   BMI 32.80 kg/m  Physical Exam Constitutional:      General: She is not in acute distress. HENT:     Head: Normocephalic and atraumatic.  Eyes:     Conjunctiva/sclera: Conjunctivae  normal.     Pupils: Pupils are equal, round, and reactive to light.  Cardiovascular:     Rate and Rhythm: Normal rate and regular rhythm.  Pulmonary:     Effort: Pulmonary effort is normal. No respiratory distress.  Abdominal:     General: There is no distension.     Tenderness: There is no abdominal tenderness.  Skin:    General: Skin is warm and dry.  Neurological:     General: No focal deficit present.     Mental Status: She is alert. Mental status is at baseline.  Psychiatric:        Mood and Affect: Mood normal.        Behavior: Behavior normal.     ED Results / Procedures / Treatments   Labs (all labs ordered are listed, but only abnormal results are displayed) Labs Reviewed - No data to display  EKG None  Radiology No results found.  Procedures Procedures  {Document cardiac monitor, telemetry assessment procedure when appropriate:1}  Medications Ordered in ED Medications - No data to display  ED Course/ Medical Decision Making/ A&P   {   Click here for ABCD2, HEART and other calculatorsREFRESH Note before signing :1}                              Medical Decision Making Amount and/or Complexity of Data Reviewed Labs: ordered. Radiology: ordered.  Risk Prescription drug management.   This patient presents to the ED with concern for ***. This involves an extensive number of treatment options, and is a complaint that carries with it a high risk of complications and morbidity.  The differential diagnosis includes ***  Co-morbidities that complicate the patient evaluation: ***  Additional history obtained from ***  External records from outside source obtained and reviewed including ***  I ordered and personally interpreted labs.  The pertinent results include:  ***  I ordered imaging studies including *** I independently visualized and interpreted imaging which showed *** I agree with the radiologist interpretation  The patient was maintained on a  cardiac monitor.  I personally viewed and interpreted the cardiac monitored which showed an underlying rhythm of: ***  Per my interpretation the patient's ECG shows ***  I ordered medication including ***  for *** I have reviewed the patients home medicines and have made adjustments as needed  Test Considered: ***  I requested consultation with the ***,  and discussed lab and imaging findings as well as pertinent plan - they recommend: ***  After the interventions noted above, I reevaluated the patient and found that they have: {resolved/improved/worsened:23923::"improved"}  Social Determinants of Health:***  Dispostion:  After consideration of the  diagnostic results and the patients response to treatment, I feel that the patent would benefit from ***.   {Document critical care time when appropriate:1} {Document review of labs and clinical decision tools ie heart score, Chads2Vasc2 etc:1}  {Document your independent review of radiology images, and any outside records:1} {Document your discussion with family members, caretakers, and with consultants:1} {Document social determinants of health affecting pt's care:1} {Document your decision making why or why not admission, treatments were needed:1} Final Clinical Impression(s) / ED Diagnoses Final diagnoses:  None    Rx / DC Orders ED Discharge Orders     None

## 2024-02-01 NOTE — ED Triage Notes (Signed)
 Patient presents to ER with constipation, some small mucus/bloody bowel movements, low grade fever. Patient endorses no relief from abdominal pain/constipation despite taking multiple medications. Patient was seen by GI doctor yesterday who sent her home with antibiotics. Patient would like a second opinion.

## 2024-02-01 NOTE — H&P (Addendum)
 History and Physical  Nancy Eaton FAO:[id number] DOB: 01/02/1956 DOA: 02/01/2024  PCP: Wynonia Lawman Leonia Reader, PA-C   Chief Complaint: Abdominal pain, rectal bleeding  HPI: Nancy Eaton is a 68 y.o. female with medical history significant for hyperlipidemia, prediabetes, intermittent constipation admitted to the hospital with severe constipation.  She has had intermittent constipation in the past, but never this severe.  She estimates that she has not had a good bowel movement in almost a month.  She has crampy gassy abdominal pain, denies fever, in the last few days has been having some nausea and vomiting when she tries p.o. intake.  Recently was seen in the ER for worsening constipation and hematochezia, she had CT scan which showed colitis with stool overload.  She has tried MiraLAX daily with minimal relief, now just had a bowel movement she has some mucus and bright red blood intermittently with her small bowel movements.  She saw gastroenterology at [facility] Dr. Octaviano Glow yesterday.  Continue serial enemas and MiraLAX twice daily, and there was plan to schedule EGD due to some dysphagia.  After she went home, she was nauseous again and vomited after trying to take a second MiraLAX and so she came back to this ER today for further evaluation.  Review of Systems: Please see HPI for pertinent positives and negatives. A complete 10 system review of systems are otherwise negative.  Past Medical History:  Diagnosis Date   Arthritis    Knee   Hyperlipidemia    Lovastatin started approx 2011   IFG (impaired fasting glucose) 2018   HbA1c 5.9% (stable)   PONV (postoperative nausea and vomiting)    Pre-diabetes    Right knee pain 12/2019   End stage: TKA recommended by ortho 01/2020->pt considering   Sigmoid diverticulosis 04/2010   "     "     "    "   Past Surgical History:  Procedure Laterality Date   BREAST ENHANCEMENT SURGERY     As of mammogram 12/2017--rupture of implants  stable.   CARPAL TUNNEL RELEASE     CESAREAN SECTION     COLONOSCOPY  age 89   [facility]--recall 10 yrs   TOTAL KNEE ARTHROPLASTY Right 07/17/2020   Procedure: RIGHT TOTAL KNEE ARTHROPLASTY;  Surgeon: Kathryne Hitch, MD;  Location: [facility];  Service: Orthopedics;  Laterality: Right;   Social History:  reports that she has never smoked. She has never used smokeless tobacco. She reports current alcohol use. She reports that she does not use drugs.  Allergies  Allergen Reactions   Penicillins Rash    20 years     Family History  Problem Relation Age of Onset   Cancer Mother        Brain   Alcohol abuse Father    COPD Brother    ADD / ADHD Son    Anxiety disorder Son    Heart disease Brother      Prior to Admission medications   Medication Sig Start Date End Date Taking? Authorizing Provider  acetaminophen (TYLENOL) 500 MG tablet Take 500 mg by mouth at bedtime.   Yes [provider]  atorvastatin (LIPITOR) 40 MG tablet Take 1 tablet (40 mg total) by mouth daily. 02/25/19  Yes McGowen, Maryjean Morn, MD  azithromycin (ZITHROMAX) 500 MG tablet Take 1 tablet (500 mg total) by mouth daily for 5 days. Take first 2 tablets together, then 1 every day until finished. 01/28/24 02/02/24 Yes Roemhildt, Lorin T, PA-C  Calcium Polycarbophil (FIBER-CAPS PO) Take 5 g by mouth in the morning and at bedtime.   Yes [provider]  CALCIUM-VITAMIN D PO Take 2 tablets by mouth daily.   Yes [provider]  Glucosamine HCl-MSM (GLUCOSAMINE-MSM PO) Take 1 tablet by mouth daily.   Yes [provider]  ibuprofen (ADVIL) 200 MG tablet Take 200 mg by mouth every 6 (six) hours as needed for headache or moderate pain.   Yes [provider]  Multiple Vitamins-Minerals (MULTIVITAMIN WITH MINERALS) tablet Take 1 tablet by mouth daily. Women 50+   Yes [provider]  Multiple Vitamins-Minerals (PRESERVISION AREDS 2) CAPS Take 1 tablet by mouth daily.   Yes  [provider]  Omega-3 Fatty Acids (FISH OIL PO) Take 1,400 mg by mouth daily. Omega3 980 mg   Yes [provider]  Turmeric 500 MG CAPS Take 1,500 mg by mouth in the morning, at noon, and at bedtime.    Yes [provider]  aspirin 81 MG chewable tablet Chew 1 tablet (81 mg total) by mouth 2 (two) times daily. Patient not taking: Reported on 02/01/2024 07/18/20   Kathryne Hitch, MD  COLLAGEN PO Take 6,000 mg by mouth 4 (four) times daily. Biotin Patient not taking: Reported on 02/01/2024    [provider]    Physical Exam: BP (!) 156/79   Pulse 97   Temp 99.8 F (37.7 C) (Oral)   Resp 18   Ht 5\' 3"  (1.6 m)   Wt 84 kg   SpO2 96%   BMI 32.80 kg/m  General:  Alert, oriented, calm, in no acute distress, a family members at the bedside Eyes: EOMI, clear conjuctivae, white sclerea Neck: supple, no masses, trachea mildline  Cardiovascular: RRR, no murmurs or rubs, no peripheral edema  Respiratory: clear to auscultation bilaterally, no wheezes, no crackles  Abdomen: soft, diffusely uncomfortable, not significantly distended, normal bowel tones heard  Skin: dry, no rashes  Musculoskeletal: no joint effusions, normal range of motion  Psychiatric: appropriate affect, normal speech  Neurologic: extraocular muscles intact, clear speech, moving all extremities with intact sensorium         Labs on Admission:  Basic Metabolic Panel: Recent Labs  Lab 01/28/24 1531 02/01/24 1107  NA 133* 129*  K 3.9 3.9  CL 101 96*  CO2 24 21*  GLUCOSE 106* 113*  BUN 8 12  CREATININE 0.79 0.66  CALCIUM 8.7* 8.4*  MG  --  2.1   Liver Function Tests: Recent Labs  Lab 01/28/24 1531 02/01/24 1107  AST 20 16  ALT 17 19  ALKPHOS 77 85  BILITOT 0.7 1.0  PROT 7.4 7.2  ALBUMIN 3.5 3.1*   Recent Labs  Lab 02/01/24 1107  LIPASE 20   No results for input(s): "AMMONIA" in the last 168 hours. CBC: Recent Labs  Lab 01/28/24 1531 02/01/24 1107  WBC  18.7* 29.8*  NEUTROABS  --  24.1*  HGB 11.8* 11.8*  HCT 36.0 36.2  MCV 87.6 88.5  PLT 377 436*   Cardiac Enzymes: No results for input(s): "CKTOTAL", "CKMB", "CKMBINDEX", "TROPONINI" in the last 168 hours. BNP (last 3 results) No results for input(s): "BNP" in the last 8760 hours.  ProBNP (last 3 results) No results for input(s): "PROBNP" in the last 8760 hours.  CBG: No results for input(s): "GLUCAP" in the last 168 hours.  Radiological Exams on Admission: No results found.  Assessment/Plan Nancy Eaton is a 68 y.o. female with medical history  significant for hyperlipidemia, prediabetes, intermittent constipation admitted to the hospital with severe constipation and colitis.   Severe constipation-with impaction seen on CT a couple days ago.  Repeat CT scan is pending to rule out perforation or other complication, though not highly suspected. -Observation admission -Clear liquid diet -Gentle IV fluids -Scheduled enema and MiraLAX -N.p.o. after midnight per GI  Colitis-with reported fever and leukocytosis. Will treat empirically with Levaquin and Flagyl.  Hyperlipidemia-hold statin and other nonessential medications in the setting of severe constipation and associated vomiting  Hyponatremia-likely hypovolemic hypovolemia in the setting of vomiting -Hydrate with normal saline -Recheck with morning labs  Rectal bleeding with mild blood loss anemia-has been stable over the last 3 days.  Will monitor with daily labs  Rotavirus infection-treatment is supportive  DVT prophylaxis: Lovenox     Code Status: Full Code  Consults called: Gastroenterology Dr. Elnoria Howard  Admission status: Observation  Time spent: 58 minutes  Jerrin Recore Sharlette Dense MD [facility] Pager [phone]  If 7PM-7AM, please contact night-coverage [email or web address] Password [facility]  02/01/2024, 4:07 PM

## 2024-02-02 ENCOUNTER — Observation Stay (HOSPITAL_COMMUNITY): Admitting: Anesthesiology

## 2024-02-02 ENCOUNTER — Encounter (HOSPITAL_COMMUNITY)
Admission: EM | Disposition: A | Discharge status: Home / Self Care | Payer: Self-pay | Source: Home / Self Care | Attending: Family Medicine

## 2024-02-02 ENCOUNTER — Encounter (HOSPITAL_COMMUNITY): Payer: Self-pay | Admitting: Internal Medicine

## 2024-02-02 DIAGNOSIS — K5900 Constipation, unspecified: Secondary | ICD-10-CM

## 2024-02-02 DIAGNOSIS — K5150 Left sided colitis without complications: Secondary | ICD-10-CM | POA: Diagnosis not present

## 2024-02-02 DIAGNOSIS — Z808 Family history of malignant neoplasm of other organs or systems: Secondary | ICD-10-CM | POA: Diagnosis not present

## 2024-02-02 DIAGNOSIS — Z825 Family history of asthma and other chronic lower respiratory diseases: Secondary | ICD-10-CM | POA: Diagnosis not present

## 2024-02-02 DIAGNOSIS — Z818 Family history of other mental and behavioral disorders: Secondary | ICD-10-CM | POA: Diagnosis not present

## 2024-02-02 DIAGNOSIS — D1800 Hemangioma unspecified site: Secondary | ICD-10-CM | POA: Diagnosis present

## 2024-02-02 DIAGNOSIS — Z96651 Presence of right artificial knee joint: Secondary | ICD-10-CM | POA: Diagnosis present

## 2024-02-02 DIAGNOSIS — R1084 Generalized abdominal pain: Secondary | ICD-10-CM | POA: Diagnosis present

## 2024-02-02 DIAGNOSIS — Z880 Allergy status to penicillin: Secondary | ICD-10-CM | POA: Diagnosis not present

## 2024-02-02 DIAGNOSIS — I10 Essential (primary) hypertension: Secondary | ICD-10-CM | POA: Diagnosis present

## 2024-02-02 DIAGNOSIS — K921 Melena: Secondary | ICD-10-CM

## 2024-02-02 DIAGNOSIS — K51511 Left sided colitis with rectal bleeding: Secondary | ICD-10-CM | POA: Diagnosis present

## 2024-02-02 DIAGNOSIS — Z79899 Other long term (current) drug therapy: Secondary | ICD-10-CM | POA: Diagnosis not present

## 2024-02-02 DIAGNOSIS — R948 Abnormal results of function studies of other organs and systems: Secondary | ICD-10-CM | POA: Diagnosis present

## 2024-02-02 DIAGNOSIS — K5190 Ulcerative colitis, unspecified, without complications: Secondary | ICD-10-CM | POA: Diagnosis present

## 2024-02-02 DIAGNOSIS — K769 Liver disease, unspecified: Secondary | ICD-10-CM | POA: Diagnosis present

## 2024-02-02 DIAGNOSIS — K51319 Ulcerative (chronic) rectosigmoiditis with unspecified complications: Secondary | ICD-10-CM

## 2024-02-02 DIAGNOSIS — D500 Iron deficiency anemia secondary to blood loss (chronic): Secondary | ICD-10-CM | POA: Diagnosis present

## 2024-02-02 DIAGNOSIS — E861 Hypovolemia: Secondary | ICD-10-CM | POA: Diagnosis present

## 2024-02-02 DIAGNOSIS — E871 Hypo-osmolality and hyponatremia: Secondary | ICD-10-CM | POA: Diagnosis present

## 2024-02-02 DIAGNOSIS — E785 Hyperlipidemia, unspecified: Secondary | ICD-10-CM | POA: Diagnosis present

## 2024-02-02 DIAGNOSIS — Z888 Allergy status to other drugs, medicaments and biological substances status: Secondary | ICD-10-CM | POA: Diagnosis not present

## 2024-02-02 DIAGNOSIS — Z7982 Long term (current) use of aspirin: Secondary | ICD-10-CM | POA: Diagnosis not present

## 2024-02-02 DIAGNOSIS — Z8249 Family history of ischemic heart disease and other diseases of the circulatory system: Secondary | ICD-10-CM | POA: Diagnosis not present

## 2024-02-02 DIAGNOSIS — Z811 Family history of alcohol abuse and dependence: Secondary | ICD-10-CM | POA: Diagnosis not present

## 2024-02-02 DIAGNOSIS — T380X5A Adverse effect of glucocorticoids and synthetic analogues, initial encounter: Secondary | ICD-10-CM | POA: Diagnosis present

## 2024-02-02 HISTORY — PX: FLEXIBLE SIGMOIDOSCOPY: SHX5431

## 2024-02-02 LAB — BASIC METABOLIC PANEL
Anion gap: 10 (ref 5–15)
BUN: 9 mg/dL (ref 8–23)
CO2: 20 mmol/L — ABNORMAL LOW (ref 22–32)
Calcium: 7.9 mg/dL — ABNORMAL LOW (ref 8.9–10.3)
Chloride: 101 mmol/L (ref 98–111)
Creatinine, Ser: 0.55 mg/dL (ref 0.44–1.00)
GFR, Estimated: >60 mL/min (ref >60)
Glucose, Bld: 103 mg/dL — ABNORMAL HIGH (ref 70–99)
Potassium: 3.8 mmol/L (ref 3.5–5.1)
Sodium: 131 mmol/L — ABNORMAL LOW (ref 135–145)

## 2024-02-02 LAB — CBC
HCT: 34.8 % — ABNORMAL LOW (ref 36.0–46.0)
Hemoglobin: 11 g/dL — ABNORMAL LOW (ref 12.0–15.0)
MCH: 28.9 pg (ref 26.0–34.0)
MCHC: 31.6 g/dL (ref 30.0–36.0)
MCV: 91.6 fL (ref 80.0–100.0)
Platelets: 377 10*3/uL (ref 150–400)
RBC: 3.8 MIL/uL — ABNORMAL LOW (ref 3.87–5.11)
RDW: 13.8 % (ref 11.5–15.5)
WBC: 29.8 10*3/uL — ABNORMAL HIGH (ref 4.0–10.5)
nRBC: 0 % (ref 0.0–0.2)

## 2024-02-02 LAB — HIV ANTIBODY (ROUTINE TESTING W REFLEX): HIV Screen 4th Generation wRfx: NONREACTIVE

## 2024-02-02 LAB — HEPATITIS B SURFACE ANTIGEN: Hepatitis B Surface Ag: NONREACTIVE

## 2024-02-02 LAB — HEPATITIS B SURFACE ANTIBODY,QUALITATIVE: Hep B S Ab: NONREACTIVE

## 2024-02-02 SURGERY — SIGMOIDOSCOPY, FLEXIBLE
Anesthesia: Monitor Anesthesia Care

## 2024-02-02 MED ORDER — FLEET ENEMA RE ENEM
ENEMA | RECTAL | Status: AC
  Filled 2024-02-02: qty 1

## 2024-02-02 MED ORDER — METHYLPREDNISOLONE SODIUM SUCC 40 MG IJ SOLR
40.0000 mg | Freq: Two times a day (BID) | INTRAMUSCULAR | Status: DC
  Administered 2024-02-02 – 2024-02-05 (×7): 40 mg via INTRAVENOUS
  Filled 2024-02-02 (×8): qty 1

## 2024-02-02 MED ORDER — PROPOFOL 1000 MG/100ML IV EMUL
INTRAVENOUS | Status: AC
  Filled 2024-02-02: qty 100

## 2024-02-02 MED ORDER — PROPOFOL 500 MG/50ML IV EMUL
INTRAVENOUS | Status: DC | PRN
  Administered 2024-02-02: 130 ug/kg/min via INTRAVENOUS

## 2024-02-02 MED ORDER — SODIUM CHLORIDE 0.9 % IV SOLN: INTRAVENOUS | Status: DC | PRN

## 2024-02-02 MED ORDER — PROPOFOL 10 MG/ML IV BOLUS
INTRAVENOUS | Status: DC | PRN
  Administered 2024-02-02: 20 mg via INTRAVENOUS
  Administered 2024-02-02: 130 mg via INTRAVENOUS
  Administered 2024-02-02: 20 mg via INTRAVENOUS

## 2024-02-02 MED ORDER — PROPOFOL 500 MG/50ML IV EMUL
INTRAVENOUS | Status: AC
  Filled 2024-02-02: qty 50

## 2024-02-02 NOTE — Interval H&P Note (Signed)
 History and Physical Interval Note:  02/02/2024 8:32 AM  Nancy Eaton  has presented today for surgery, with the diagnosis of Colitis.  The various methods of treatment have been discussed with the patient and family. After consideration of risks, benefits and other options for treatment, the patient has consented to  Procedure(s): SIGMOIDOSCOPY, FLEXIBLE (N/A) as a surgical intervention.  The patient's history has been reviewed, patient examined, no change in status, stable for surgery.  I have reviewed the patient's chart and labs.  Questions were answered to the patient's satisfaction.     [name] D

## 2024-02-02 NOTE — Transfer of Care (Signed)
 Immediate Anesthesia Transfer of Care Note  Patient: Nancy Eaton  Procedure(s) Performed: Arnell Sieving  Patient Location: PACU  Anesthesia Type:MAC  Level of Consciousness: sedated  Airway & Oxygen Therapy: Patient Spontanous Breathing and Patient connected to face mask oxygen  Post-op Assessment: Report given to RN and Post -op Vital signs reviewed and stable  Post vital signs: Reviewed and stable  Last Vitals:  Vitals Value Taken Time  BP    Temp    Pulse    Resp    SpO2      Last Pain:  Vitals:   02/02/24 0817  TempSrc: Temporal  PainSc: 0-No pain         Complications: No notable events documented.

## 2024-02-02 NOTE — Progress Notes (Signed)
 [facility]  PROGRESS NOTE  Nancy Eaton GUR:[id number] DOB: 1956-11-16 DOA: 02/01/2024 PCP: Brayton El, PA-C   Brief HPI:   68 y.o. female with medical history significant for hyperlipidemia, prediabetes, intermittent constipation admitted to the hospital with severe constipation.  She has had intermittent constipation in the past, but never this severe.  She estimates that she has not had a good bowel movement in almost a month.  She has crampy gassy abdominal pain, denies fever, in the last few days has been having some nausea and vomiting when she tries p.o. intake.     Assessment/Plan:   69 y.o. female with medical history significant for hyperlipidemia, prediabetes, intermittent constipation admitted to the hospital with severe constipation and colitis.    Severe constipation-with impaction seen on CT a couple days ago.   -CT scan obtained yesterday showed continued moderate wall thickening of descending and sigmoid colon consistent with infectious or inflammatory colitis.  Large amount of stool in the proximal colon some degree of obstruction due to inflammation. -Underwent sigmoidoscopy which confirmed ulcerative colitis -Started on Solu-Medrol 40 mg IV every 12 hours -Continue Levaquin, Flagyl -GI following    Hyperlipidemia -hold statin and other nonessential medications in the setting of severe constipation and associated vomiting   Hyponatremia -likely hypovolemic hypovolemia in the setting of vomiting -Improving    Rectal bleeding with mild blood loss anemia -has been stable over the last 3 days.  Will monitor with daily labs   Rotavirus infection -treatment is supportive      Medications     [MAR Hold] enoxaparin (LOVENOX) injection  40 mg Subcutaneous Q24H     Data Reviewed:   CBG:  No results for input(s): "GLUCAP" in the last 168 hours.  SpO2: 96 %    Vitals:   02/01/24 2132 02/02/24 0041 02/02/24 0511 02/02/24 0817  BP:  138/75 (!) 149/76 (!) 146/70 (!) 152/67  Pulse: 91 100 97 94  Resp: 18 18 17  (!) 23  Temp: 98 F (36.7 C) 98.3 F (36.8 C) 98.3 F (36.8 C) 98.7 F (37.1 C)  TempSrc: Oral   Temporal  SpO2: 97% 98% 96% 96%  Weight:      Height:          Data Reviewed:  Basic Metabolic Panel: Recent Labs  Lab 01/28/24 1531 02/01/24 1107 02/02/24 0427  NA 133* 129* 131*  K 3.9 3.9 3.8  CL 101 96* 101  CO2 24 21* 20*  GLUCOSE 106* 113* 103*  BUN 8 12 9   CREATININE 0.79 0.66 0.55  CALCIUM 8.7* 8.4* 7.9*  MG  --  2.1  --     CBC: Recent Labs  Lab 01/28/24 1531 02/01/24 1107 02/02/24 0427  WBC 18.7* 29.8* 29.8*  NEUTROABS  --  24.1*  --   HGB 11.8* 11.8* 11.0*  HCT 36.0 36.2 34.8*  MCV 87.6 88.5 91.6  PLT 377 436* 377    LFT Recent Labs  Lab 01/28/24 1531 02/01/24 1107  AST 20 16  ALT 17 19  ALKPHOS 77 85  BILITOT 0.7 1.0  PROT 7.4 7.2  ALBUMIN 3.5 3.1*     Antibiotics: Anti-infectives (From admission, onward)    Start     Dose/Rate Route Frequency Ordered Stop   02/01/24 1700  [MAR Hold]  levofloxacin (LEVAQUIN) IVPB 750 mg        (MAR Hold since Fri 02/02/2024 at 0811.Hold Reason: Transfer to a Procedural area)   750 mg 100 mL/hr over 90  Minutes Intravenous Every 24 hours 02/01/24 1604     02/01/24 1630  [MAR Hold]  metroNIDAZOLE (FLAGYL) IVPB 500 mg        (MAR Hold since Fri 02/02/2024 at 0811.Hold Reason: Transfer to a Procedural area)   500 mg 100 mL/hr over 60 Minutes Intravenous Every 12 hours 02/01/24 1604     02/01/24 1400  metroNIDAZOLE (FLAGYL) IVPB 500 mg  Status:  Discontinued        500 mg 100 mL/hr over 60 Minutes Intravenous  Once 02/01/24 1319 02/01/24 1617   02/01/24 1330  ceFEPIme (MAXIPIME) 2 g in sodium chloride 0.9 % 100 mL IVPB        2 g 200 mL/hr over 30 Minutes Intravenous  Once 02/01/24 1319 02/01/24 1429        DVT prophylaxis: Lovenox  Code Status: Full code  Family Communication: No family at bedside   CONSULTS  gastroenterology   Subjective   Seen after sigmoidoscopy.  Found to have ulcerative colitis.   Objective    Physical Examination:   Appears in no acute distress Abdomen-no tender to palpation Extremities no edema   Status is: Inpatient:             Meredeth Ide   [facility] If 7PM-7AM, please contact night-coverage at [email or web address], Office  [phone]   02/02/2024, 8:52 AM  LOS: 0 days

## 2024-02-02 NOTE — Op Note (Signed)
 [facility] Patient Name: Nancy Eaton Procedure Date: 02/02/2024 MRN: [id number] Attending MD: Jeani Hawking , MD, [id number] Date of Birth: [birth year] CSN: [id number] Age: 68 Admit Type: Inpatient Procedure:                Flexible Sigmoidoscopy Indications:              Hematochezia, Abnormal CT of the GI tract Providers:                Jeani Hawking, MD, Rogue Jury, RN, Salley Scarlet, Technician, Albertina Senegal. Alday CRNA, CRNA Referring MD:              Medicines:                Propofol per Anesthesia Complications:            No immediate complications. Estimated Blood Loss:     Estimated blood loss: none. Procedure:                Pre-Anesthesia Assessment:                           - Prior to the procedure, a History and Physical                            was performed, and patient medications and                            allergies were reviewed. The patient's tolerance of                            previous anesthesia was also reviewed. The risks                            and benefits of the procedure and the sedation                            options and risks were discussed with the patient.                            All questions were answered, and informed consent                            was obtained. Prior Anticoagulants: The patient has                            taken no anticoagulant or antiplatelet agents. ASA                            Grade Assessment: II - A patient with mild systemic                            disease. After reviewing the risks and benefits,  the patient was deemed in satisfactory condition to                            undergo the procedure.                           - Sedation was administered by an anesthesia                            professional. Deep sedation was attained.                           After obtaining informed consent, the scope was                             passed under direct vision. The [id number]-[id number] ([id number])                            Olympus endoscope was introduced through the anus                            and advanced to the the descending colon. The                            flexible sigmoidoscopy was accomplished without                            difficulty. The patient tolerated the procedure                            well. The quality of the bowel preparation was good. Scope In: 8:50:31 AM Scope Out: 8:57:56 AM Scope Withdrawal Time: 0 hours 0 minutes 1 second  Total Procedure Duration: 0 hours 7 minutes 25 seconds  Findings:      Inflammation was found in a continuous and circumferential pattern from       the rectum to the descending colon. This was graded as Mayo Score 3       (severe, with spontaneous bleeding, ulcerations). Biopsies were taken       with a cold forceps for histology.      A severe (Mayo Score 3) left sided colitis was identified. The current       findings are consistent with a left-sided UC. Multiple cold biopsies       were obtained. Impression:               - Severe (Mayo Score 3) left-sided ulcerative                            colitis. Biopsied. Moderate Sedation:      Not Applicable - Patient had care per Anesthesia. Recommendation:           - Return patient to hospital ward for ongoing care.                           - Resume regular diet.                           -  Continue antibiotics for now.                           - Start on Solu-Medrol 40 mg IV q12 hours. Procedure Code(s):        --- Professional ---                           [phone], Sigmoidoscopy, flexible; with biopsy, single                            or multiple Diagnosis Code(s):        --- Professional ---                           K51.50, Left sided colitis without complications                           K92.1, Melena (includes Hematochezia)                           R93.3, Abnormal findings on diagnostic imaging of                             other parts of digestive tract CPT copyright 2022 American Medical Association. All rights reserved. The codes documented in this report are preliminary and upon coder review may  be revised to meet current compliance requirements. Jeani Hawking, MD Jeani Hawking, MD 02/02/2024 9:11:22 AM This report has been signed electronically. Number of Addenda: 0

## 2024-02-02 NOTE — Anesthesia Preprocedure Evaluation (Signed)
 Anesthesia Evaluation  Patient identified by MRN, date of birth, ID band Patient awake    Reviewed: Allergy & Precautions, NPO status , Patient's Chart, lab work & pertinent test results  History of Anesthesia Complications (+) PONV and history of anesthetic complications  Airway Mallampati: II  TM Distance: >3 FB Neck ROM: Full    Dental  (+) Teeth Intact, Dental Advisory Given   Pulmonary neg pulmonary ROS   Pulmonary exam normal breath sounds clear to auscultation       Cardiovascular negative cardio ROS Normal cardiovascular exam Rhythm:Regular Rate:Normal     Neuro/Psych  Neuromuscular disease    GI/Hepatic Neg liver ROS,,,Colitis    Endo/Other  Obesity   Renal/GU negative Renal ROS     Musculoskeletal  (+) Arthritis ,    Abdominal   Peds  Hematology  (+) Blood dyscrasia, anemia   Anesthesia Other Findings Day of surgery medications reviewed with the patient.  Reproductive/Obstetrics                             Anesthesia Physical Anesthesia Plan  ASA: 2  Anesthesia Plan: MAC   Post-op Pain Management:    Induction: Intravenous  PONV Risk Score and Plan: 3 and TIVA and Treatment may vary due to age or medical condition  Airway Management Planned: Natural Airway and Simple Face Mask  Additional Equipment:   Intra-op Plan:   Post-operative Plan:   Informed Consent: I have reviewed the patients History and Physical, chart, labs and discussed the procedure including the risks, benefits and alternatives for the proposed anesthesia with the patient or authorized representative who has indicated his/her understanding and acceptance.     Dental advisory given  Plan Discussed with: CRNA and Anesthesiologist  Anesthesia Plan Comments:        Anesthesia Quick Evaluation

## 2024-02-02 NOTE — Plan of Care (Signed)
  Problem: Clinical Measurements: Goal: Diagnostic test results will improve Outcome: Progressing   Problem: Pain Managment: Goal: General experience of comfort will improve and/or be controlled Outcome: Progressing

## 2024-02-03 DIAGNOSIS — K51319 Ulcerative (chronic) rectosigmoiditis with unspecified complications: Secondary | ICD-10-CM | POA: Diagnosis not present

## 2024-02-03 DIAGNOSIS — K5900 Constipation, unspecified: Secondary | ICD-10-CM | POA: Diagnosis not present

## 2024-02-03 LAB — CBC
HCT: 33.9 % — ABNORMAL LOW (ref 36.0–46.0)
Hemoglobin: 11.1 g/dL — ABNORMAL LOW (ref 12.0–15.0)
MCH: 29 pg (ref 26.0–34.0)
MCHC: 32.7 g/dL (ref 30.0–36.0)
MCV: 88.5 fL (ref 80.0–100.0)
Platelets: 420 10*3/uL — ABNORMAL HIGH (ref 150–400)
RBC: 3.83 MIL/uL — ABNORMAL LOW (ref 3.87–5.11)
RDW: 13.7 % (ref 11.5–15.5)
WBC: 29.9 10*3/uL — ABNORMAL HIGH (ref 4.0–10.5)
nRBC: 0 % (ref 0.0–0.2)

## 2024-02-03 LAB — BASIC METABOLIC PANEL
Anion gap: 8 (ref 5–15)
BUN: 10 mg/dL (ref 8–23)
CO2: 24 mmol/L (ref 22–32)
Calcium: 8.7 mg/dL — ABNORMAL LOW (ref 8.9–10.3)
Chloride: 102 mmol/L (ref 98–111)
Creatinine, Ser: 0.57 mg/dL (ref 0.44–1.00)
GFR, Estimated: >60 mL/min (ref >60)
Glucose, Bld: 208 mg/dL — ABNORMAL HIGH (ref 70–99)
Potassium: 4.2 mmol/L (ref 3.5–5.1)
Sodium: 134 mmol/L — ABNORMAL LOW (ref 135–145)

## 2024-02-03 NOTE — Plan of Care (Signed)

## 2024-02-03 NOTE — Plan of Care (Addendum)
 VSS. No c/o pain. LBM 3/14. No acute events overnight.  Problem: Education: Goal: Knowledge of General Education information will improve Description: Including pain rating scale, medication(s)/side effects and non-pharmacologic comfort measures Outcome: Progressing   Problem: Clinical Measurements: Goal: Ability to maintain clinical measurements within normal limits will improve Outcome: Progressing Goal: Will remain free from infection Outcome: Progressing   Problem: Activity: Goal: Risk for activity intolerance will decrease Outcome: Progressing   Problem: Nutrition: Goal: Adequate nutrition will be maintained Outcome: Progressing   Problem: Pain Managment: Goal: General experience of comfort will improve and/or be controlled Outcome: Progressing   Problem: Safety: Goal: Ability to remain free from injury will improve Outcome: Progressing

## 2024-02-03 NOTE — Progress Notes (Signed)
 Subjective: Patient reports that fecal urgency has improved. She had a small bowel movement today morning and also had a bowel movement yesterday with some amount of blood in it. Denies abdominal pain and is tolerating clear liquid diet without nausea or vomiting.  Objective: Vital signs in last 24 hours: Temp:  [97.8 F (36.6 C)-98.3 F (36.8 C)] 98.3 F (36.8 C) (03/15 0443) Pulse Rate:  [82-97] 86 (03/15 0443) Resp:  [17-18] 17 (03/15 0443) BP: (118-143)/(66-74) 118/66 (03/15 0443) SpO2:  [92 %-94 %] 94 % (03/15 0443) Weight change:  Last BM Date : 02/02/24  PE: Not in distress GENERAL: Minimal pallor  ABDOMEN: Nontender, soft, bowel sounds audible EXTREMITIES: No deformity  Lab Results: Results for orders placed or performed during the hospital encounter of 02/01/24 (from the past 48 hours)  CBC with Differential     Status: Abnormal   Collection Time: 02/01/24 11:07 AM  Result Value Ref Range   WBC 29.8 (H) 4.0 - 10.5 K/uL   RBC 4.09 3.87 - 5.11 MIL/uL   Hemoglobin 11.8 (L) 12.0 - 15.0 g/dL   HCT 16.1 09.6 - 04.5 %   MCV 88.5 80.0 - 100.0 fL   MCH 28.9 26.0 - 34.0 pg   MCHC 32.6 30.0 - 36.0 g/dL   RDW 40.9 81.1 - 91.4 %   Platelets 436 (H) 150 - 400 K/uL   nRBC 0.0 0.0 - 0.2 %   Neutrophils Relative % 81 %   Neutro Abs 24.1 (H) 1.7 - 7.7 K/uL   Lymphocytes Relative 6 %   Lymphs Abs 1.8 0.7 - 4.0 K/uL   Monocytes Relative 10 %   Monocytes Absolute 3.1 (H) 0.1 - 1.0 K/uL   Eosinophils Relative 0 %   Eosinophils Absolute 0.0 0.0 - 0.5 K/uL   Basophils Relative 1 %   Basophils Absolute 0.2 (H) 0.0 - 0.1 K/uL   WBC Morphology TOXIC GRANULATION    Immature Granulocytes 2 %   Abs Immature Granulocytes 0.53 (H) 0.00 - 0.07 K/uL   Reactive, Benign Lymphocytes PRESENT     Comment: Performed at [facility], [address] [place], [place] [address]  Lipase, blood     Status: None   Collection Time: 02/01/24 11:07 AM  Result Value Ref Range    Lipase 20 11 - 51 U/L    Comment: Performed at [facility], [address], [place], [place] [address]  Comprehensive metabolic panel     Status: Abnormal   Collection Time: 02/01/24 11:07 AM  Result Value Ref Range   Sodium 129 (L) 135 - 145 mmol/L   Potassium 3.9 3.5 - 5.1 mmol/L   Chloride 96 (L) 98 - 111 mmol/L   CO2 21 (L) 22 - 32 mmol/L   Glucose, Bld 113 (H) 70 - 99 mg/dL    Comment: Glucose reference range applies only to samples taken after fasting for at least 8 hours.   BUN 12 8 - 23 mg/dL   Creatinine, Ser 8.65 0.44 - 1.00 mg/dL   Calcium 8.4 (L) 8.9 - 10.3 mg/dL   Total Protein 7.2 6.5 - 8.1 g/dL   Albumin 3.1 (L) 3.5 - 5.0 g/dL   AST 16 15 - 41 U/L   ALT 19 0 - 44 U/L   Alkaline Phosphatase 85 38 - 126 U/L   Total Bilirubin 1.0 0.0 - 1.2 mg/dL   GFR, Estimated >78 >46 mL/min    Comment: (NOTE) Calculated using the CKD-EPI Creatinine Equation (  2021)    Anion gap 12 5 - 15    Comment: Performed at [facility], [address] [place], [place] [address]  Magnesium     Status: None   Collection Time: 02/01/24 11:07 AM  Result Value Ref Range   Magnesium 2.1 1.7 - 2.4 mg/dL    Comment: Performed at [facility], [address], [place], [place] [address]  HIV Antibody (routine testing w rflx)     Status: None   Collection Time: 02/01/24  7:47 PM  Result Value Ref Range   HIV Screen 4th Generation wRfx Non Reactive Non Reactive    Comment: Performed at [facility], [address] [place], [place] [address]  Basic metabolic panel     Status: Abnormal   Collection Time: 02/02/24  4:27 AM  Result Value Ref Range   Sodium 131 (L) 135 - 145 mmol/L   Potassium 3.8 3.5 - 5.1 mmol/L   Chloride 101 98 - 111 mmol/L   CO2 20 (L) 22 - 32 mmol/L   Glucose, Bld 103 (H) 70 - 99 mg/dL    Comment: Glucose reference range applies only to samples taken after fasting for at least 8 hours.   BUN 9 8 - 23 mg/dL    Creatinine, Ser 9.14 0.44 - 1.00 mg/dL   Calcium 7.9 (L) 8.9 - 10.3 mg/dL   GFR, Estimated >78 >29 mL/min    Comment: (NOTE) Calculated using the CKD-EPI Creatinine Equation (2021)    Anion gap 10 5 - 15    Comment: Performed at [facility], [address] [place], [place] [address]  CBC     Status: Abnormal   Collection Time: 02/02/24  4:27 AM  Result Value Ref Range   WBC 29.8 (H) 4.0 - 10.5 K/uL   RBC 3.80 (L) 3.87 - 5.11 MIL/uL   Hemoglobin 11.0 (L) 12.0 - 15.0 g/dL   HCT 08.6 (L) 57.8 - 46.9 %   MCV 91.6 80.0 - 100.0 fL   MCH 28.9 26.0 - 34.0 pg   MCHC 31.6 30.0 - 36.0 g/dL   RDW 62.9 52.8 - 41.3 %   Platelets 377 150 - 400 K/uL   nRBC 0.0 0.0 - 0.2 %    Comment: Performed at [facility], [address] [place], [place] [address]  Hepatitis B surface antibody,qualitative     Status: None   Collection Time: 02/02/24  2:57 PM  Result Value Ref Range   Hep B S Ab NON REACTIVE NON REACTIVE    Comment: (NOTE) Inconsistent with immunity, less than 10 mIU/mL.  Performed at [facility], [address] [place], [place] [address]   Hepatitis B surface antigen     Status: None   Collection Time: 02/02/24  2:57 PM  Result Value Ref Range   Hepatitis B Surface Ag NON REACTIVE NON REACTIVE    Comment: Performed at [facility], [address] [place], [place] [address]  CBC     Status: Abnormal   Collection Time: 02/03/24  5:09 AM  Result Value Ref Range   WBC 29.9 (H) 4.0 - 10.5 K/uL   RBC 3.83 (L) 3.87 - 5.11 MIL/uL   Hemoglobin 11.1 (L) 12.0 - 15.0 g/dL   HCT 03.4 (L) 74.2 - 59.5 %   MCV 88.5 80.0 - 100.0 fL   MCH 29.0 26.0 - 34.0 pg   MCHC 32.7 30.0 - 36.0 g/dL  RDW 13.7 11.5 - 15.5 %   Platelets 420 (H) 150 - 400 K/uL   nRBC 0.0 0.0 - 0.2 %    Comment: Performed at [facility], [address], [place], [place] [address]  Basic metabolic panel     Status: Abnormal   Collection Time: 02/03/24   5:09 AM  Result Value Ref Range   Sodium 134 (L) 135 - 145 mmol/L   Potassium 4.2 3.5 - 5.1 mmol/L   Chloride 102 98 - 111 mmol/L   CO2 24 22 - 32 mmol/L   Glucose, Bld 208 (H) 70 - 99 mg/dL    Comment: Glucose reference range applies only to samples taken after fasting for at least 8 hours.   BUN 10 8 - 23 mg/dL   Creatinine, Ser 0.93 0.44 - 1.00 mg/dL   Calcium 8.7 (L) 8.9 - 10.3 mg/dL   GFR, Estimated >23 >55 mL/min    Comment: (NOTE) Calculated using the CKD-EPI Creatinine Equation (2021)    Anion gap 8 5 - 15    Comment: Performed at [facility], [address] [place], [place] [address]    Studies/Results: US Abdomen Limited RUQ (LIVER/GB) Result Date: 02/02/2024 CLINICAL DATA:  Liver mass. EXAM: ULTRASOUND ABDOMEN LIMITED RIGHT UPPER QUADRANT COMPARISON:  February 01, 2024. FINDINGS: Gallbladder: No gallstones or wall thickening visualized. No sonographic Murphy sign noted by sonographer. Common bile duct: Diameter: 4 mm which is within normal limits. Liver: Grossly normal echogenicity of hepatic parenchyma. 1.7 x 1.2 x 1.3 cm rounded echogenic structure is noted with larger hypoechoic area adjacent to it; while this may represent a complex hemangioma, other pathology cannot be excluded. Portal vein is patent on color Doppler imaging with normal direction of blood flow towards the liver. Other: None. IMPRESSION: Complex right hepatic lesion is noted which has large hypoechoic component and peripheral rounded echogenic solid component. This most likely corresponds to abnormality seen on CT scan. If this is a hemangioma, it is a complex hemangioma given that it is not homogeneously hyperechoic. MRI with and without gadolinium is recommended to rule out other pathology. Electronically Signed   By: Lupita Raider M.D.   On: 02/02/2024 08:24   CT ABDOMEN PELVIS W CONTRAST Result Date: 02/01/2024 CLINICAL DATA:  Nausea, vomiting. EXAM: CT ABDOMEN AND PELVIS WITH CONTRAST  TECHNIQUE: Multidetector CT imaging of the abdomen and pelvis was performed using the standard protocol following bolus administration of intravenous contrast. RADIATION DOSE REDUCTION: This exam was performed according to the departmental dose-optimization program which includes automated exposure control, adjustment of the mA and/or kV according to patient size and/or use of iterative reconstruction technique. CONTRAST:  OMNIPAQUE IOHEXOL 300 MG/ML  SOLN COMPARISON:  January 28, 2024 FINDINGS: Lower chest: Minimal left basilar subsegmental atelectasis. Hepatobiliary: No cholelithiasis or biliary dilatation is noted. Stable appearance of 3.1 x 2.9 cm lobulated hypodensity seen in posterior segment of right hepatic lobe laterally with possible peripheral nodular enhancement. This may simply represent hemangioma, but these images are not diagnostic for this at this time. Pancreas: Unremarkable. No pancreatic ductal dilatation or surrounding inflammatory changes. Spleen: Normal in size without focal abnormality. Adrenals/Urinary Tract: Adrenal glands are unremarkable. Kidneys are normal, without renal calculi, focal lesion, or hydronephrosis. Bladder is unremarkable. Stomach/Bowel: The stomach is unremarkable. The appendix is not clearly visualized. There is no evidence of small-bowel obstruction. Continued moderate wall thickening of descending and sigmoid colon is noted most consistent with infectious or inflammatory colitis, with  mildly enlarged adjacent lymph nodes suggesting inflammatory process. Vascular/Lymphatic: No significant vascular findings are present. No enlarged abdominal or pelvic lymph nodes. Reproductive: Uterus and bilateral adnexa are unremarkable. Other: No abdominal wall hernia or abnormality. No abdominopelvic ascites. Musculoskeletal: No acute or significant osseous findings. IMPRESSION: Continued moderate wall thickening of descending and sigmoid colon is noted most consistent with  infectious or inflammatory colitis as noted on prior exam. Large amount of stool is seen in the more proximal colon suggesting some degree of obstruction secondary to the inflammation. Stable appearance of 3.1 x 2.9 cm lobulated low density with possible peripheral enhancement seen in posterior segment of right hepatic lobe. Potentially this may represent hemangioma, but these images are not diagnostic for this. Further evaluation with MRI or ultrasound is recommended to rule out other pathology. Electronically Signed   By: Lupita Raider M.D.   On: 02/01/2024 16:40    Medications: I have reviewed the patient's current medications.  Assessment: Severe left-sided ulcerative colitis on flexible sigmoidoscopy by Dr. Elnoria Howard yesterday, biopsies taken, results pending   WBC 29.9, hemoglobin 11.1, platelet 420, blood sugar 208 Hepatitis B surface antigen nonreactive Rotavirus noted on GI pathogen panel from 01/28/2024 3.1 x 2.9 cm right hepatic lobe lesion?  Hemangioma on CT Ultrasound: Complex right hepatic lesion possible complex hemangioma, MRI with and without gadolinium recommended  Plan: Will advance diet to full liquid Continue Solu-Medrol 40 mg every 12 hours On empiric IV levofloxacin and IV Flagyl Will continue to follow tomorrow.  Kerin Salen, MD 02/03/2024, 10:47 AM

## 2024-02-03 NOTE — Progress Notes (Signed)
 [facility]  PROGRESS NOTE  Nancy Eaton GNF:[id number] DOB: 04-01-1956 DOA: 02/01/2024 PCP: Brayton El, PA-C   Brief HPI:   68 y.o. female with medical history significant for hyperlipidemia, prediabetes, intermittent constipation admitted to the hospital with severe constipation.  She has had intermittent constipation in the past, but never this severe.  She estimates that she has not had a good bowel movement in almost a month.  She has crampy gassy abdominal pain, denies fever, in the last few days has been having some nausea and vomiting when she tries p.o. intake.     Assessment/Plan:   68 y.o. female with medical history significant for hyperlipidemia, prediabetes, intermittent constipation admitted to the hospital with severe constipation and colitis.    Ulcerative colitis -Seen on flexible sigmoidoscopy, she has severe left-sided ulcerative colitis -Biopsies obtained, result pending -Currently on Solu-Medrol 40 mg IV every 12 hours -On IV Levaquin and Flagyl -GI following  Severe constipation-with impaction seen on CT a couple days ago.   -CT scan obtained yesterday showed continued moderate wall thickening of descending and sigmoid colon consistent with infectious or inflammatory colitis.  Large amount of stool in the proximal colon some degree of obstruction due to inflammation. -Hopefully should improve with treatment of ulcerative colitis as above  Complex right hepatic lesion -Seen on abdominal ultrasound -Will need MRI with and without gadolinium -Can be done as outpatient    Hyperlipidemia -hold statin and other nonessential medications in the setting of severe constipation and associated vomiting   Hyponatremia -likely hypovolemic hypovolemia in the setting of vomiting -Improving    Rectal bleeding with mild blood loss anemia -has been stable over the last 3 days.  Will monitor with daily labs -Hemoglobin is 11.1   Rotavirus  infection -treatment is supportive      Medications     enoxaparin (LOVENOX) injection  40 mg Subcutaneous Q24H   methylPREDNISolone (SOLU-MEDROL) injection  40 mg Intravenous Q12H     Data Reviewed:   CBG:  No results for input(s): "GLUCAP" in the last 168 hours.  SpO2: 94 % O2 Flow Rate (L/min): 6 L/min    Vitals:   02/02/24 0950 02/02/24 1226 02/02/24 2049 02/03/24 0443  BP: (!) 153/68 (!) 143/74 124/66 118/66  Pulse: 86 97 82 86  Resp: 17 18 18 17   Temp:  98.3 F (36.8 C) 97.8 F (36.6 C) 98.3 F (36.8 C)  TempSrc:      SpO2: 95% 94% 92% 94%  Weight:      Height:          Data Reviewed:  Basic Metabolic Panel: Recent Labs  Lab 01/28/24 1531 02/01/24 1107 02/02/24 0427 02/03/24 0509  NA 133* 129* 131* 134*  K 3.9 3.9 3.8 4.2  CL 101 96* 101 102  CO2 24 21* 20* 24  GLUCOSE 106* 113* 103* 208*  BUN 8 12 9 10   CREATININE 0.79 0.66 0.55 0.57  CALCIUM 8.7* 8.4* 7.9* 8.7*  MG  --  2.1  --   --     CBC: Recent Labs  Lab 01/28/24 1531 02/01/24 1107 02/02/24 0427 02/03/24 0509  WBC 18.7* 29.8* 29.8* 29.9*  NEUTROABS  --  24.1*  --   --   HGB 11.8* 11.8* 11.0* 11.1*  HCT 36.0 36.2 34.8* 33.9*  MCV 87.6 88.5 91.6 88.5  PLT 377 436* 377 420*    LFT Recent Labs  Lab 01/28/24 1531 02/01/24 1107  AST 20 16  ALT 17 19  ALKPHOS 77 85  BILITOT 0.7 1.0  PROT 7.4 7.2  ALBUMIN 3.5 3.1*     Antibiotics: Anti-infectives (From admission, onward)    Start     Dose/Rate Route Frequency Ordered Stop   02/01/24 1700  levofloxacin (LEVAQUIN) IVPB 750 mg        750 mg 100 mL/hr over 90 Minutes Intravenous Every 24 hours 02/01/24 1604     02/01/24 1630  metroNIDAZOLE (FLAGYL) IVPB 500 mg        500 mg 100 mL/hr over 60 Minutes Intravenous Every 12 hours 02/01/24 1604     02/01/24 1400  metroNIDAZOLE (FLAGYL) IVPB 500 mg  Status:  Discontinued        500 mg 100 mL/hr over 60 Minutes Intravenous  Once 02/01/24 1319 02/01/24 1617   02/01/24 1330   ceFEPIme (MAXIPIME) 2 g in sodium chloride 0.9 % 100 mL IVPB        2 g 200 mL/hr over 30 Minutes Intravenous  Once 02/01/24 1319 02/01/24 1429        DVT prophylaxis: Lovenox  Code Status: Full code  Family Communication: No family at bedside   CONSULTS gastroenterology   Subjective   Patient says that the bloody bowel movements are improving.  Denies pain   Objective    Physical Examination:  General-appears in no acute distress Heart-S1-S2, regular, no murmur auscultated Lungs-clear to auscultation bilaterally, no wheezing or crackles auscultated Abdomen-soft, nontender, no organomegaly Extremities-no edema in the lower extremities Neuro-alert, oriented x3, no focal deficit noted   Status is: Inpatient:             Meredeth Ide   [facility] If 7PM-7AM, please contact night-coverage at [email or web address], Office  [phone]   02/03/2024, 9:42 AM  LOS: 1 day

## 2024-02-04 DIAGNOSIS — K51319 Ulcerative (chronic) rectosigmoiditis with unspecified complications: Secondary | ICD-10-CM | POA: Diagnosis not present

## 2024-02-04 DIAGNOSIS — K5900 Constipation, unspecified: Secondary | ICD-10-CM | POA: Diagnosis not present

## 2024-02-04 LAB — COMPREHENSIVE METABOLIC PANEL
ALT: 19 U/L (ref 0–44)
AST: 16 U/L (ref 15–41)
Albumin: 2.4 g/dL — ABNORMAL LOW (ref 3.5–5.0)
Alkaline Phosphatase: 79 U/L (ref 38–126)
Anion gap: 9 (ref 5–15)
BUN: 11 mg/dL (ref 8–23)
CO2: 22 mmol/L (ref 22–32)
Calcium: 8.5 mg/dL — ABNORMAL LOW (ref 8.9–10.3)
Chloride: 105 mmol/L (ref 98–111)
Creatinine, Ser: 0.53 mg/dL (ref 0.44–1.00)
GFR, Estimated: >60 mL/min (ref >60)
Glucose, Bld: 201 mg/dL — ABNORMAL HIGH (ref 70–99)
Potassium: 3.9 mmol/L (ref 3.5–5.1)
Sodium: 136 mmol/L (ref 135–145)
Total Bilirubin: 0.4 mg/dL (ref 0.0–1.2)
Total Protein: 5.8 g/dL — ABNORMAL LOW (ref 6.5–8.1)

## 2024-02-04 LAB — CBC
HCT: 34.3 % — ABNORMAL LOW (ref 36.0–46.0)
Hemoglobin: 10.7 g/dL — ABNORMAL LOW (ref 12.0–15.0)
MCH: 28.5 pg (ref 26.0–34.0)
MCHC: 31.2 g/dL (ref 30.0–36.0)
MCV: 91.5 fL (ref 80.0–100.0)
Platelets: 453 10*3/uL — ABNORMAL HIGH (ref 150–400)
RBC: 3.75 MIL/uL — ABNORMAL LOW (ref 3.87–5.11)
RDW: 13.9 % (ref 11.5–15.5)
WBC: 32.2 10*3/uL — ABNORMAL HIGH (ref 4.0–10.5)
nRBC: 0 % (ref 0.0–0.2)

## 2024-02-04 MED ORDER — ALUM & MAG HYDROXIDE-SIMETH 200-200-20 MG/5ML PO SUSP
30.0000 mL | ORAL | Status: DC | PRN
  Administered 2024-02-04: 30 mL via ORAL
  Filled 2024-02-04: qty 30

## 2024-02-04 MED ORDER — FAMOTIDINE 20 MG PO TABS
20.0000 mg | ORAL_TABLET | Freq: Every day | ORAL | Status: DC
  Administered 2024-02-04 – 2024-02-05 (×2): 20 mg via ORAL
  Filled 2024-02-04 (×2): qty 1

## 2024-02-04 NOTE — Plan of Care (Signed)

## 2024-02-04 NOTE — Anesthesia Postprocedure Evaluation (Signed)
 Anesthesia Post Note  Patient: Nancy Eaton  Procedure(s) Performed: Arnell Sieving     Patient location during evaluation: Endoscopy Anesthesia Type: MAC Level of consciousness: oriented, awake and alert and awake Pain management: pain level controlled Vital Signs Assessment: post-procedure vital signs reviewed and stable Respiratory status: spontaneous breathing, nonlabored ventilation, respiratory function stable and patient connected to nasal cannula oxygen Cardiovascular status: blood pressure returned to baseline and stable Postop Assessment: no headache, no backache and no apparent nausea or vomiting Anesthetic complications: no   No notable events documented.  Last Vitals:    Last Pain:                 Collene Schlichter

## 2024-02-04 NOTE — Progress Notes (Addendum)
 Weekend cross coverage note for Dr. Lance Coon. Nancy Eaton Subjective: Patient reports having about 7 bowel movements in the last 24 hours.  She has continued to notice small amount of bright red blood.  She reports decreased fecal urgency.  She denies abdominal pain.  She has been able to tolerate full liquid diet.  Objective: Vital signs in last 24 hours: Temp:  [97.9 F (36.6 C)-98 F (36.7 C)] 98 F (36.7 C) (03/16 0441) Pulse Rate:  [76-91] 76 (03/16 0441) Resp:  [16-18] 16 (03/16 0441) BP: (120-137)/(67-87) 133/69 (03/16 0441) SpO2:  [96 %-98 %] 96 % (03/16 0441) Weight change:  Last BM Date : 02/03/24  PE: Not in distress GENERAL: Mild pallor  ABDOMEN: Nondistended, nontender, normoactive bowel sounds EXTREMITIES: No deformity  Lab Results: Results for orders placed or performed during the hospital encounter of 02/01/24 (from the past 48 hours)  Hepatitis B surface antibody,qualitative     Status: None   Collection Time: 02/02/24  2:57 PM  Result Value Ref Range   Hep B S Ab NON REACTIVE NON REACTIVE    Comment: (NOTE) Inconsistent with immunity, less than 10 mIU/mL.  Performed at [facility], [address] [place], [place] [address]   Hepatitis B surface antigen     Status: None   Collection Time: 02/02/24  2:57 PM  Result Value Ref Range   Hepatitis B Surface Ag NON REACTIVE NON REACTIVE    Comment: Performed at [facility], [address], [place], [place] [address]  CBC     Status: Abnormal   Collection Time: 02/03/24  5:09 AM  Result Value Ref Range   WBC 29.9 (H) 4.0 - 10.5 K/uL   RBC 3.83 (L) 3.87 - 5.11 MIL/uL   Hemoglobin 11.1 (L) 12.0 - 15.0 g/dL   HCT 09.8 (L) 11.9 - 14.7 %   MCV 88.5 80.0 - 100.0 fL   MCH 29.0 26.0 - 34.0 pg   MCHC 32.7 30.0 - 36.0 g/dL   RDW 82.9 56.2 - 13.0 %   Platelets 420 (H) 150 - 400 K/uL   nRBC 0.0 0.0 - 0.2 %    Comment: Performed at [facility], [address], [place], [place] [address]   Basic metabolic panel     Status: Abnormal   Collection Time: 02/03/24  5:09 AM  Result Value Ref Range   Sodium 134 (L) 135 - 145 mmol/L   Potassium 4.2 3.5 - 5.1 mmol/L   Chloride 102 98 - 111 mmol/L   CO2 24 22 - 32 mmol/L   Glucose, Bld 208 (H) 70 - 99 mg/dL    Comment: Glucose reference range applies only to samples taken after fasting for at least 8 hours.   BUN 10 8 - 23 mg/dL   Creatinine, Ser 4.69 0.44 - 1.00 mg/dL   Calcium 8.7 (L) 8.9 - 10.3 mg/dL   GFR, Estimated >62 >95 mL/min    Comment: (NOTE) Calculated using the CKD-EPI Creatinine Equation (2021)    Anion gap 8 5 - 15    Comment: Performed at [facility], [address], [place], [place] [address]  CBC     Status: Abnormal   Collection Time: 02/04/24  4:19 AM  Result Value Ref Range   WBC 32.2 (H) 4.0 - 10.5 K/uL   RBC 3.75 (L) 3.87 - 5.11 MIL/uL   Hemoglobin 10.7 (L) 12.0 - 15.0 g/dL   HCT 24.4 (L) 01.0 - 27.2 %  MCV 91.5 80.0 - 100.0 fL   MCH 28.5 26.0 - 34.0 pg   MCHC 31.2 30.0 - 36.0 g/dL   RDW 30.8 65.7 - 84.6 %   Platelets 453 (H) 150 - 400 K/uL   nRBC 0.0 0.0 - 0.2 %    Comment: Performed at [facility], [address], [place], [place] [address]  Comprehensive metabolic panel     Status: Abnormal   Collection Time: 02/04/24  4:19 AM  Result Value Ref Range   Sodium 136 135 - 145 mmol/L   Potassium 3.9 3.5 - 5.1 mmol/L   Chloride 105 98 - 111 mmol/L   CO2 22 22 - 32 mmol/L   Glucose, Bld 201 (H) 70 - 99 mg/dL    Comment: Glucose reference range applies only to samples taken after fasting for at least 8 hours.   BUN 11 8 - 23 mg/dL   Creatinine, Ser 2.84 0.44 - 1.00 mg/dL   Calcium 8.5 (L) 8.9 - 10.3 mg/dL   Total Protein 5.8 (L) 6.5 - 8.1 g/dL   Albumin 2.4 (L) 3.5 - 5.0 g/dL   AST 16 15 - 41 U/L   ALT 19 0 - 44 U/L   Alkaline Phosphatase 79 38 - 126 U/L   Total Bilirubin 0.4 0.0 - 1.2 mg/dL   GFR, Estimated >13 >24 mL/min    Comment:  (NOTE) Calculated using the CKD-EPI Creatinine Equation (2021)    Anion gap 9 5 - 15    Comment: Performed at [facility], [address], [place], [place] [address]    Studies/Results: No results found.  Medications: I have reviewed the patient's current medications.  Assessment: Severe left-sided colitis suspicious for ulcerative colitis, biopsies pending, on IV Solu-Medrol 40 mg every 12 hours  Empirically on IV Flagyl and IV levofloxacin.  Elevation in leukocytosis likely related to steroid use Normal renal function No signs of dehydration  Plan: Will advance to regular diet. Fecal calprotectin pending, pathology pending. Likely we will be able to transition to oral steroid tomorrow. Hepatitis B surface antigen negative, TB Gold pending. Will likely need to be on maintenance therapy for newly diagnosed ulcerative colitis as an outpatient. Dr. Elnoria Howard will follow in a.m. tomorrow.  Kerin Salen, MD 02/04/2024, 9:55 AM

## 2024-02-04 NOTE — Progress Notes (Addendum)
 [facility]  PROGRESS NOTE  Nancy Eaton WUJ:[id number] DOB: [birth year] DOA: 02/01/2024 PCP: Brayton El, PA-C   Brief HPI:   68 y.o. female with medical history significant for hyperlipidemia, prediabetes, intermittent constipation admitted to the hospital with severe constipation.  She has had intermittent constipation in the past, but never this severe.  She estimates that she has not had a good bowel movement in almost a month.  She has crampy gassy abdominal pain, denies fever, in the last few days has been having some nausea and vomiting when she tries p.o. intake.     Assessment/Plan:   68 y.o. female with medical history significant for hyperlipidemia, prediabetes, intermittent constipation admitted to the hospital with severe constipation and colitis.    Ulcerative colitis -Seen on flexible sigmoidoscopy, she has severe left-sided ulcerative colitis -Biopsies obtained, result pending -Currently on Solu-Medrol 40 mg IV every 12 hours -On IV Levaquin and Flagyl -GI following; likely will be switched to oral steroids tomorrow -Likely discharge home in a.m.  Severe constipation-with impaction seen on CT a couple days ago.   -Resolved with treatment of ulcerative colitis as above -CT scan obtained yesterday showed continued moderate wall thickening of descending and sigmoid colon consistent with infectious or inflammatory colitis.  Large amount of stool in the proximal colon some degree of obstruction due to inflammation.  Complex right hepatic lesion -Seen on abdominal ultrasound -Will need MRI with and without gadolinium -Can be done as outpatient   Hyperlipidemia -hold statin and other nonessential medications in the setting of severe constipation and associated vomiting   Hyponatremia -Resolved    Rectal bleeding with mild blood loss anemia -has been stable over the last 3 days.  Will monitor with daily labs -Hemoglobin is 10.7   Rotavirus  infection -treatment is supportive   Leukocytosis -WBC 32,000 likely due to steroids.   Medications     enoxaparin (LOVENOX) injection  40 mg Subcutaneous Q24H   methylPREDNISolone (SOLU-MEDROL) injection  40 mg Intravenous Q12H     Data Reviewed:   CBG:  No results for input(s): "GLUCAP" in the last 168 hours.  SpO2: 96 % O2 Flow Rate (L/min): 6 L/min    Vitals:   02/03/24 0443 02/03/24 1227 02/03/24 1944 02/04/24 0441  BP: 118/66 137/87 120/67 133/69  Pulse: 86 91 85 76  Resp: 17 18 18 16   Temp: 98.3 F (36.8 C) 98 F (36.7 C) 97.9 F (36.6 C) 98 F (36.7 C)  TempSrc:  Oral Oral Oral  SpO2: 94% 97% 98% 96%  Weight:      Height:          Data Reviewed:  Basic Metabolic Panel: Recent Labs  Lab 01/28/24 1531 02/01/24 1107 02/02/24 0427 02/03/24 0509 02/04/24 0419  NA 133* 129* 131* 134* 136  K 3.9 3.9 3.8 4.2 3.9  CL 101 96* 101 102 105  CO2 24 21* 20* 24 22  GLUCOSE 106* 113* 103* 208* 201*  BUN 8 12 9 10 11   CREATININE 0.79 0.66 0.55 0.57 0.53  CALCIUM 8.7* 8.4* 7.9* 8.7* 8.5*  MG  --  2.1  --   --   --     CBC: Recent Labs  Lab 01/28/24 1531 02/01/24 1107 02/02/24 0427 02/03/24 0509 02/04/24 0419  WBC 18.7* 29.8* 29.8* 29.9* 32.2*  NEUTROABS  --  24.1*  --   --   --   HGB 11.8* 11.8* 11.0* 11.1* 10.7*  HCT 36.0 36.2 34.8* 33.9* 34.3*  MCV  87.6 88.5 91.6 88.5 91.5  PLT 377 436* 377 420* 453*    LFT Recent Labs  Lab 01/28/24 1531 02/01/24 1107 02/04/24 0419  AST 20 16 16   ALT 17 19 19   ALKPHOS 77 85 79  BILITOT 0.7 1.0 0.4  PROT 7.4 7.2 5.8*  ALBUMIN 3.5 3.1* 2.4*     Antibiotics: Anti-infectives (From admission, onward)    Start     Dose/Rate Route Frequency Ordered Stop   02/01/24 1700  levofloxacin (LEVAQUIN) IVPB 750 mg        750 mg 100 mL/hr over 90 Minutes Intravenous Every 24 hours 02/01/24 1604     02/01/24 1630  metroNIDAZOLE (FLAGYL) IVPB 500 mg        500 mg 100 mL/hr over 60 Minutes Intravenous Every 12  hours 02/01/24 1604     02/01/24 1400  metroNIDAZOLE (FLAGYL) IVPB 500 mg  Status:  Discontinued        500 mg 100 mL/hr over 60 Minutes Intravenous  Once 02/01/24 1319 02/01/24 1617   02/01/24 1330  ceFEPIme (MAXIPIME) 2 g in sodium chloride 0.9 % 100 mL IVPB        2 g 200 mL/hr over 30 Minutes Intravenous  Once 02/01/24 1319 02/01/24 1429        DVT prophylaxis: Lovenox  Code Status: Full code  Family Communication: No family at bedside   CONSULTS gastroenterology   Subjective   Continues to have small amount of rectal bleeding.   Objective    Physical Examination:  General-appears in no acute distress Heart-S1-S2, regular, no murmur auscultated Lungs-clear to auscultation bilaterally, no wheezing or crackles auscultated Abdomen-soft, nontender, no organomegaly Extremities-no edema in the lower extremities Neuro-alert, oriented x3, no focal deficit noted   Status is: Inpatient:             Meredeth Ide   [facility] If 7PM-7AM, please contact night-coverage at [email or web address], Office  [phone]   02/04/2024, 9:14 AM  LOS: 2 days

## 2024-02-04 NOTE — TOC Initial Note (Signed)
 Transition of Care [facility]) - Initial/Assessment Note    Patient Details  Name: Nancy Eaton MRN: [id number] Date of Birth: [birth year]  Transition of Care [facility]) CM/SW Contact:    Adrian Prows, RN Phone Number: 02/04/2024, 11:39 AM  Clinical Narrative:                 Spoke w/ pt, sister, and family in room; pt says she lives at home w/ her son; she plans to return at d/c; pt identified POC sister Florencia Reasons [phone]); her sister will provide transportation; pt verified PCP/Insurance; she denies SDOH risks; pt says she has BSC; she does not have HH services or home oxygen; no TOC needs; TOC is signing off; please place consult if needed.  Expected Discharge Plan: Home/Self Care Barriers to Discharge: Continued Medical Work up   Patient Goals and CMS Choice Patient states their goals for this hospitalization and ongoing recovery are:: home CMS Medicare.gov Compare Post Acute Care list provided to:: Patient   [place] ownership interest in [facility].provided to:: Patient    Expected Discharge Plan and Services       Living arrangements for the past 2 months: Single Family Home                                      Prior Living Arrangements/Services Living arrangements for the past 2 months: Single Family Home Lives with:: Adult Children Patient language and need for interpreter reviewed:: Yes Do you feel safe going back to the place where you live?: Yes      Need for Family Participation in Patient Care: Yes (Comment) Care giver support system in place?: Yes (comment) Current home services: DME (BSC) Criminal Activity/Legal Involvement Pertinent to Current Situation/Hospitalization: No - Comment as needed  Activities of Daily Living   ADL Screening (condition at time of admission) Independently performs ADLs?: Yes (appropriate for developmental age) Is the patient deaf or have difficulty hearing?: No Does the patient have  difficulty seeing, even when wearing glasses/contacts?: No Does the patient have difficulty concentrating, remembering, or making decisions?: No  Permission Sought/Granted Permission sought to share information with : Case Manager Permission granted to share information with : Yes, Verbal Permission Granted  Share Information with NAME: Case Manager     Permission granted to share info w Relationship: Florencia Reasons (sister) [phone]     Emotional Assessment Appearance:: Appears stated age Attitude/Demeanor/Rapport: Gracious Affect (typically observed): Accepting Orientation: : Oriented to Self, Oriented to Place, Oriented to  Time, Oriented to Situation Alcohol / Substance Use: Not Applicable Psych Involvement: No (comment)  Admission diagnosis:  Generalized abdominal pain [R10.84] Constipation [K59.00] Ulcerative colitis (HCC) [K51.90] Patient Active Problem List   Diagnosis Date Noted   Ulcerative colitis (HCC) 02/02/2024   Constipation 02/01/2024   Arthrofibrosis of total knee replacement (HCC) 08/26/2020   Status post total right knee replacement 07/17/2020   Unilateral primary osteoarthritis, right knee 12/30/2019   Acute right-sided low back pain with right-sided sciatica 12/25/2017   Health maintenance examination 09/30/2013   Hyperlipidemia 09/30/2013   PCP:  Drosinis, Leonia Reader, PA-C Pharmacy:   [facility] [address], [place] - [address] [place] [address] Phone: [phone] Fax: [phone]  [id number] - [place], [place] - [address] AT [facility] OF [address]  [place] [address] Phone: [phone] Fax: [phone]     Social Drivers of Health (SDOH) Social History: SDOH Screenings   Food Insecurity: No Food Insecurity (02/04/2024)  Housing: Low Risk  (02/04/2024)  Transportation Needs: No Transportation Needs (02/04/2024)   Utilities: Not At Risk (02/04/2024)  Social Connections: Unknown (02/01/2024)  Tobacco Use: Low Risk  (02/02/2024)   SDOH Interventions: Food Insecurity Interventions: Intervention Not Indicated, Inpatient TOC Housing Interventions: Intervention Not Indicated, Inpatient TOC Transportation Interventions: Intervention Not Indicated, Inpatient TOC Utilities Interventions: Intervention Not Indicated, Inpatient TOC   Readmission Risk Interventions     No data to display

## 2024-02-05 DIAGNOSIS — K51319 Ulcerative (chronic) rectosigmoiditis with unspecified complications: Secondary | ICD-10-CM | POA: Diagnosis not present

## 2024-02-05 DIAGNOSIS — K5900 Constipation, unspecified: Secondary | ICD-10-CM | POA: Diagnosis not present

## 2024-02-05 LAB — CBC
HCT: 32.2 % — ABNORMAL LOW (ref 36.0–46.0)
Hemoglobin: 10.5 g/dL — ABNORMAL LOW (ref 12.0–15.0)
MCH: 29.2 pg (ref 26.0–34.0)
MCHC: 32.6 g/dL (ref 30.0–36.0)
MCV: 89.4 fL (ref 80.0–100.0)
Platelets: 464 10*3/uL — ABNORMAL HIGH (ref 150–400)
RBC: 3.6 MIL/uL — ABNORMAL LOW (ref 3.87–5.11)
RDW: 13.8 % (ref 11.5–15.5)
WBC: 27.3 10*3/uL — ABNORMAL HIGH (ref 4.0–10.5)
nRBC: 0.1 % (ref 0.0–0.2)

## 2024-02-05 LAB — SURGICAL PATHOLOGY

## 2024-02-05 MED ORDER — PREDNISONE 10 MG PO TABS: ORAL_TABLET | ORAL | 0 refills | Status: DC

## 2024-02-05 NOTE — Progress Notes (Signed)
 AVS reviewed w/ Kathie Rhodes who verbalized an understanding. No  other questions at this time- pt to follow up w/ PCP regarding BP med - not listed on AVS- pt will check BP at home and follow up w/ pcp regarding BP parameters- IV antbx infusing- will d/c to home once complete

## 2024-02-05 NOTE — Plan of Care (Signed)

## 2024-02-05 NOTE — Discharge Summary (Signed)
 Physician Discharge Summary   Patient: Nancy Eaton MRN: [id number] DOB: 17-Jan-1956  Admit date:     02/01/2024  Discharge date: 02/05/24  Discharge Physician: Meredeth Ide   PCP: Drosinis, Leonia Reader, PA-C   Recommendations at discharge:   Follow-up with GI Dr. Jeani Hawking in 2 weeks Prednisone taper as prescribed  Discharge Diagnoses: Principal Problem:   Constipation Active Problems:   Ulcerative colitis (HCC)  Resolved Problems:   * No resolved hospital problems. *  Hospital Course: 68 y.o. female with medical history significant for hyperlipidemia, prediabetes, intermittent constipation admitted to the hospital with severe constipation.  She has had intermittent constipation in the past, but never this severe.  She estimates that she has not had a good bowel movement in almost a month.  She has crampy gassy abdominal pain, denies fever, in the last few days has been having some nausea and vomiting when she tries p.o. intake.     Assessment and Plan:  68 y.o. female with medical history significant for hyperlipidemia, prediabetes, intermittent constipation admitted to the hospital with severe constipation and colitis.    Ulcerative colitis -Seen on flexible sigmoidoscopy, she has severe left-sided ulcerative colitis -Biopsies obtained, result pending -She was started on Solu-Medrol 40 mg IV every 12 hours -On IV Levaquin and Flagyl while in the hospital -GI following; likely will be switched to oral steroids tomorrow -Will discharge on prednisone taper as recommended per GI -Follow-up with gastroenterology as outpatient   Severe constipation-with impaction seen on CT a couple days ago.   -Resolved with treatment of ulcerative colitis as above -CT scan obtained yesterday showed continued moderate wall thickening of descending and sigmoid colon consistent with infectious or inflammatory colitis.  Large amount of stool in the proximal colon some degree of obstruction  due to inflammation.   Complex right hepatic lesion -Seen on abdominal ultrasound -Will need MRI with and without gadolinium -Can be done as outpatient    Hyperlipidemia Continue statin   Hyponatremia -Resolved     Rectal bleeding with mild blood loss anemia -has been stable over the last 3 days.  Will monitor with daily labs -Hemoglobin is 10.5   Rotavirus infection -Resolved  Leukocytosis -WBC 27,000 likely due to steroids.          Consultants: Gastroenterology Procedures performed:  Disposition: Home Diet recommendation:  Discharge Diet Orders (From admission, onward)     Start     Ordered   02/05/24 0000  Diet - low sodium heart healthy        02/05/24 1529           Regular diet DISCHARGE MEDICATION: Allergies as of 02/05/2024       Reactions   Penicillins Rash   20 years         Medication List     STOP taking these medications    aspirin 81 MG chewable tablet   azithromycin 500 MG tablet Commonly known as: ZITHROMAX   COLLAGEN PO   FIBER-CAPS PO   ibuprofen 200 MG tablet Commonly known as: ADVIL       TAKE these medications    acetaminophen 500 MG tablet Commonly known as: TYLENOL Take 500 mg by mouth at bedtime.   atorvastatin 40 MG tablet Commonly known as: LIPITOR Take 1 tablet (40 mg total) by mouth daily.   CALCIUM-VITAMIN D PO Take 2 tablets by mouth daily.   FISH OIL PO Take 1,400 mg by mouth daily. Omega3 980 mg  GLUCOSAMINE-MSM PO Take 1 tablet by mouth daily.   multivitamin with minerals tablet Take 1 tablet by mouth daily. Women 50+   PreserVision AREDS 2 Caps Take 1 tablet by mouth daily.   predniSONE 10 MG tablet Commonly known as: DELTASONE Prednisone 40 mg po daily x 1 week then Prednisone 30 mg po daily x 1 week then Prednisone 20 mg po daily x 1 week then Prednisone 10 mg daily x 1 week then Prednisone 5 mg po daily x 1 week, then stop Start taking on: February 06, 2024   Turmeric 500 MG  Caps Take 1,500 mg by mouth in the morning, at noon, and at bedtime.        Discharge Exam: Filed Weights   02/01/24 1011  Weight: 84 kg   General-appears in no acute distress Heart-S1-S2, regular, no murmur auscultated Lungs-clear to auscultation bilaterally, no wheezing or crackles auscultated Abdomen-soft, nontender, no organomegaly Extremities-no edema in the lower extremities Neuro-alert, oriented x3, no focal deficit noted  Condition at discharge: good  The results of significant diagnostics from this hospitalization (including imaging, microbiology, ancillary and laboratory) are listed below for reference.   Imaging Studies: US Abdomen Limited RUQ (LIVER/GB) Result Date: 02/02/2024 CLINICAL DATA:  Liver mass. EXAM: ULTRASOUND ABDOMEN LIMITED RIGHT UPPER QUADRANT COMPARISON:  February 01, 2024. FINDINGS: Gallbladder: No gallstones or wall thickening visualized. No sonographic Murphy sign noted by sonographer. Common bile duct: Diameter: 4 mm which is within normal limits. Liver: Grossly normal echogenicity of hepatic parenchyma. 1.7 x 1.2 x 1.3 cm rounded echogenic structure is noted with larger hypoechoic area adjacent to it; while this may represent a complex hemangioma, other pathology cannot be excluded. Portal vein is patent on color Doppler imaging with normal direction of blood flow towards the liver. Other: None. IMPRESSION: Complex right hepatic lesion is noted which has large hypoechoic component and peripheral rounded echogenic solid component. This most likely corresponds to abnormality seen on CT scan. If this is a hemangioma, it is a complex hemangioma given that it is not homogeneously hyperechoic. MRI with and without gadolinium is recommended to rule out other pathology. Electronically Signed   By: Lupita Raider M.D.   On: 02/02/2024 08:24   CT ABDOMEN PELVIS W CONTRAST Result Date: 02/01/2024 CLINICAL DATA:  Nausea, vomiting. EXAM: CT ABDOMEN AND PELVIS WITH  CONTRAST TECHNIQUE: Multidetector CT imaging of the abdomen and pelvis was performed using the standard protocol following bolus administration of intravenous contrast. RADIATION DOSE REDUCTION: This exam was performed according to the departmental dose-optimization program which includes automated exposure control, adjustment of the mA and/or kV according to patient size and/or use of iterative reconstruction technique. CONTRAST:  OMNIPAQUE IOHEXOL 300 MG/ML  SOLN COMPARISON:  January 28, 2024 FINDINGS: Lower chest: Minimal left basilar subsegmental atelectasis. Hepatobiliary: No cholelithiasis or biliary dilatation is noted. Stable appearance of 3.1 x 2.9 cm lobulated hypodensity seen in posterior segment of right hepatic lobe laterally with possible peripheral nodular enhancement. This may simply represent hemangioma, but these images are not diagnostic for this at this time. Pancreas: Unremarkable. No pancreatic ductal dilatation or surrounding inflammatory changes. Spleen: Normal in size without focal abnormality. Adrenals/Urinary Tract: Adrenal glands are unremarkable. Kidneys are normal, without renal calculi, focal lesion, or hydronephrosis. Bladder is unremarkable. Stomach/Bowel: The stomach is unremarkable. The appendix is not clearly visualized. There is no evidence of small-bowel obstruction. Continued moderate wall thickening of descending and sigmoid colon is noted most consistent with infectious or inflammatory colitis, with  mildly enlarged adjacent lymph nodes suggesting inflammatory process. Vascular/Lymphatic: No significant vascular findings are present. No enlarged abdominal or pelvic lymph nodes. Reproductive: Uterus and bilateral adnexa are unremarkable. Other: No abdominal wall hernia or abnormality. No abdominopelvic ascites. Musculoskeletal: No acute or significant osseous findings. IMPRESSION: Continued moderate wall thickening of descending and sigmoid colon is noted most consistent  with infectious or inflammatory colitis as noted on prior exam. Large amount of stool is seen in the more proximal colon suggesting some degree of obstruction secondary to the inflammation. Stable appearance of 3.1 x 2.9 cm lobulated low density with possible peripheral enhancement seen in posterior segment of right hepatic lobe. Potentially this may represent hemangioma, but these images are not diagnostic for this. Further evaluation with MRI or ultrasound is recommended to rule out other pathology. Electronically Signed   By: Lupita Raider M.D.   On: 02/01/2024 16:40   CT ABDOMEN PELVIS W CONTRAST Result Date: 01/28/2024 CLINICAL DATA:  Acute abdominal pain.  Bright red blood per rectum. EXAM: CT ABDOMEN AND PELVIS WITH CONTRAST TECHNIQUE: Multidetector CT imaging of the abdomen and pelvis was performed using the standard protocol following bolus administration of intravenous contrast. RADIATION DOSE REDUCTION: This exam was performed according to the departmental dose-optimization program which includes automated exposure control, adjustment of the mA and/or kV according to patient size and/or use of iterative reconstruction technique. CONTRAST:  OMNIPAQUE IOHEXOL 300 MG/ML  SOLN COMPARISON:  None Available. FINDINGS: Lower chest: No acute abnormality. Hepatobiliary: There is a 2.3 x 2.1 cm hypodense area in the right lobe of the liver with slightly lobulated border measuring 28 Hounsfield units. Otherwise, the liver is within normal limits. Gallbladder and bile ducts are within normal limits. Pancreas: Unremarkable. No pancreatic ductal dilatation or surrounding inflammatory changes. Spleen: Normal in size without focal abnormality. Adrenals/Urinary Tract: Adrenal glands are unremarkable. Kidneys are normal, without renal calculi, focal lesion, or hydronephrosis. Bladder is unremarkable. Stomach/Bowel: There circumferential wall thickening and inflammation involving the descending colon, sigmoid colon  and rectum. There are multiple small pericolonic lymph nodes. There is no perforation or abscess. There is no bowel obstruction, but the colon proximal to this level is distended with a large amount of stool. Appendix is not well seen. Small bowel and stomach are within normal limits. Vascular/Lymphatic: Aortic atherosclerosis. No enlarged abdominal or pelvic lymph nodes. Reproductive: Uterus and bilateral adnexa are unremarkable. Other: There is a small fat containing umbilical hernia. There is no ascites. Musculoskeletal: Degenerative changes affect the spine. IMPRESSION: 1. Circumferential wall thickening and inflammation involving the descending colon, sigmoid colon and rectum compatible with infectious/inflammatory colitis. No perforation or abscess. 2. The colon proximal to this level is distended with a large amount of stool. 3. 2.3 cm hypodense area in the right lobe of the liver with slightly lobulated border. This is indeterminate. Further evaluation with ultrasound recommended. 4. Aortic atherosclerosis. Aortic Atherosclerosis (ICD10-I70.0). Electronically Signed   By: Darliss Cheney M.D.   On: 01/28/2024 17:33    Microbiology: Results for orders placed or performed during the hospital encounter of 01/28/24  Gastrointestinal Panel by PCR , Stool     Status: Abnormal   Collection Time: 01/28/24  6:15 PM   Specimen: Stool  Result Value Ref Range Status   Campylobacter species NOT DETECTED NOT DETECTED Final   Plesimonas shigelloides NOT DETECTED NOT DETECTED Final   Salmonella species NOT DETECTED NOT DETECTED Final   Yersinia enterocolitica NOT DETECTED NOT DETECTED Final   Vibrio species  NOT DETECTED NOT DETECTED Final   Vibrio cholerae NOT DETECTED NOT DETECTED Final   Enteroaggregative E coli (EAEC) NOT DETECTED NOT DETECTED Final   Enteropathogenic E coli (EPEC) NOT DETECTED NOT DETECTED Final   Enterotoxigenic E coli (ETEC) NOT DETECTED NOT DETECTED Final   Shiga like toxin producing  E coli (STEC) NOT DETECTED NOT DETECTED Final   Shigella/Enteroinvasive E coli (EIEC) NOT DETECTED NOT DETECTED Final   Cryptosporidium NOT DETECTED NOT DETECTED Final   Cyclospora cayetanensis NOT DETECTED NOT DETECTED Final   Entamoeba histolytica NOT DETECTED NOT DETECTED Final   Giardia lamblia NOT DETECTED NOT DETECTED Final   Adenovirus F40/41 NOT DETECTED NOT DETECTED Final   Astrovirus NOT DETECTED NOT DETECTED Final   Norovirus GI/GII NOT DETECTED NOT DETECTED Final   Rotavirus A DETECTED (A) NOT DETECTED Final   Sapovirus (I, II, IV, and V) NOT DETECTED NOT DETECTED Final    Comment: Performed at [facility], [address] [place], [place] [address]  C Difficile Quick Screen w PCR reflex     Status: None   Collection Time: 01/28/24  6:15 PM   Specimen: Stool  Result Value Ref Range Status   C Diff antigen NEGATIVE NEGATIVE Final   C Diff toxin NEGATIVE NEGATIVE Final   C Diff interpretation No C. difficile detected.  Final    Comment: Performed at [facility], [address] [place], [place] [address]    Labs: CBC: Recent Labs  Lab 02/01/24 1107 02/02/24 0427 02/03/24 0509 02/04/24 0419 02/05/24 0349  WBC 29.8* 29.8* 29.9* 32.2* 27.3*  NEUTROABS 24.1*  --   --   --   --   HGB 11.8* 11.0* 11.1* 10.7* 10.5*  HCT 36.2 34.8* 33.9* 34.3* 32.2*  MCV 88.5 91.6 88.5 91.5 89.4  PLT 436* 377 420* 453* 464*   Basic Metabolic Panel: Recent Labs  Lab 02/01/24 1107 02/02/24 0427 02/03/24 0509 02/04/24 0419  NA 129* 131* 134* 136  K 3.9 3.8 4.2 3.9  CL 96* 101 102 105  CO2 21* 20* 24 22  GLUCOSE 113* 103* 208* 201*  BUN 12 9 10 11   CREATININE 0.66 0.55 0.57 0.53  CALCIUM 8.4* 7.9* 8.7* 8.5*  MG 2.1  --   --   --    Liver Function Tests: Recent Labs  Lab 02/01/24 1107 02/04/24 0419  AST 16 16  ALT 19 19  ALKPHOS 85 79  BILITOT 1.0 0.4  PROT 7.2 5.8*  ALBUMIN 3.1* 2.4*   CBG: No results for input(s): "GLUCAP" in the last 168  hours.  Discharge time spent: greater than 30 minutes.  Signed: Meredeth Ide, MD [facility] 02/05/2024

## 2024-02-05 NOTE — Plan of Care (Signed)
  Problem: Education: Goal: Knowledge of General Education information will improve Description: Including pain rating scale, medication(s)/side effects and non-pharmacologic comfort measures 02/05/2024 1821 by Sharen Counter, RN Outcome: Completed/Met 02/05/2024 1714 by Sharen Counter, RN Outcome: Progressing   Problem: Health Behavior/Discharge Planning: Goal: Ability to manage health-related needs will improve 02/05/2024 1821 by Sharen Counter, RN Outcome: Completed/Met 02/05/2024 1714 by Sharen Counter, RN Outcome: Progressing   Problem: Clinical Measurements: Goal: Ability to maintain clinical measurements within normal limits will improve 02/05/2024 1821 by Sharen Counter, RN Outcome: Completed/Met 02/05/2024 1714 by Sharen Counter, RN Outcome: Progressing Goal: Will remain free from infection 02/05/2024 1821 by Sharen Counter, RN Outcome: Completed/Met 02/05/2024 1714 by Sharen Counter, RN Outcome: Progressing Goal: Diagnostic test results will improve 02/05/2024 1821 by Sharen Counter, RN Outcome: Completed/Met 02/05/2024 1714 by Sharen Counter, RN Outcome: Progressing Goal: Respiratory complications will improve 02/05/2024 1821 by Sharen Counter, RN Outcome: Completed/Met 02/05/2024 1714 by Sharen Counter, RN Outcome: Progressing Goal: Cardiovascular complication will be avoided 02/05/2024 1821 by Sharen Counter, RN Outcome: Completed/Met 02/05/2024 1714 by Sharen Counter, RN Outcome: Progressing   Problem: Activity: Goal: Risk for activity intolerance will decrease 02/05/2024 1821 by Sharen Counter, RN Outcome: Completed/Met 02/05/2024 1714 by Sharen Counter, RN Outcome: Progressing   Problem: Nutrition: Goal: Adequate nutrition will be maintained 02/05/2024 1821 by Sharen Counter, RN Outcome: Completed/Met 02/05/2024 1714 by Sharen Counter, RN Outcome: Progressing   Problem: Coping: Goal: Level of anxiety will  decrease 02/05/2024 1821 by Sharen Counter, RN Outcome: Completed/Met 02/05/2024 1714 by Sharen Counter, RN Outcome: Progressing   Problem: Elimination: Goal: Will not experience complications related to bowel motility 02/05/2024 1821 by Sharen Counter, RN Outcome: Completed/Met 02/05/2024 1714 by Sharen Counter, RN Outcome: Progressing Goal: Will not experience complications related to urinary retention 02/05/2024 1821 by Sharen Counter, RN Outcome: Completed/Met 02/05/2024 1714 by Sharen Counter, RN Outcome: Progressing   Problem: Pain Managment: Goal: General experience of comfort will improve and/or be controlled 02/05/2024 1821 by Sharen Counter, RN Outcome: Completed/Met 02/05/2024 1714 by Sharen Counter, RN Outcome: Progressing   Problem: Safety: Goal: Ability to remain free from injury will improve 02/05/2024 1821 by Sharen Counter, RN Outcome: Completed/Met 02/05/2024 1714 by Sharen Counter, RN Outcome: Progressing   Problem: Skin Integrity: Goal: Risk for impaired skin integrity will decrease 02/05/2024 1821 by Sharen Counter, RN Outcome: Completed/Met 02/05/2024 1714 by Sharen Counter, RN Outcome: Progressing

## 2024-02-05 NOTE — Progress Notes (Signed)
 [facility]  PROGRESS NOTE  Caterina Racine NUU:[id number] DOB: [birth year] DOA: 02/01/2024 PCP: Brayton El, PA-C   Brief HPI:   68 y.o. female with medical history significant for hyperlipidemia, prediabetes, intermittent constipation admitted to the hospital with severe constipation.  She has had intermittent constipation in the past, but never this severe.  She estimates that she has not had a good bowel movement in almost a month.  She has crampy gassy abdominal pain, denies fever, in the last few days has been having some nausea and vomiting when she tries p.o. intake.     Assessment/Plan:   68 y.o. female with medical history significant for hyperlipidemia, prediabetes, intermittent constipation admitted to the hospital with severe constipation and colitis.    Ulcerative colitis -Seen on flexible sigmoidoscopy, she has severe left-sided ulcerative colitis -Biopsies obtained, result pending -Currently on Solu-Medrol 40 mg IV every 12 hours -On IV Levaquin and Flagyl -GI following; likely will be switched to oral steroids tomorrow -Likely discharge home in a.m.  Severe constipation-with impaction seen on CT a couple days ago.   -Resolved with treatment of ulcerative colitis as above -CT scan obtained yesterday showed continued moderate wall thickening of descending and sigmoid colon consistent with infectious or inflammatory colitis.  Large amount of stool in the proximal colon some degree of obstruction due to inflammation.  Complex right hepatic lesion -Seen on abdominal ultrasound -Will need MRI with and without gadolinium -Can be done as outpatient   Hyperlipidemia -hold statin and other nonessential medications in the setting of severe constipation and associated vomiting   Hyponatremia -Resolved    Rectal bleeding with mild blood loss anemia -has been stable over the last 3 days.  Will monitor with daily labs -Hemoglobin is 10.5   Rotavirus  infection -treatment is supportive   Leukocytosis -WBC 27,000 likely due to steroids.   Medications     enoxaparin (LOVENOX) injection  40 mg Subcutaneous Q24H   famotidine  20 mg Oral Daily   methylPREDNISolone (SOLU-MEDROL) injection  40 mg Intravenous Q12H     Data Reviewed:   CBG:  No results for input(s): "GLUCAP" in the last 168 hours.  SpO2: 97 % O2 Flow Rate (L/min): 6 L/min    Vitals:   02/04/24 0441 02/04/24 1323 02/04/24 1943 02/05/24 0524  BP: 133/69 (!) 149/92 (!) 144/75 139/79  Pulse: 76 88 86 72  Resp: 16 16 16 16   Temp: 98 F (36.7 C) 98.1 F (36.7 C) 98.4 F (36.9 C) 97.7 F (36.5 C)  TempSrc: Oral Oral Oral   SpO2: 96% 98% 97% 97%  Weight:      Height:          Data Reviewed:  Basic Metabolic Panel: Recent Labs  Lab 02/01/24 1107 02/02/24 0427 02/03/24 0509 02/04/24 0419  NA 129* 131* 134* 136  K 3.9 3.8 4.2 3.9  CL 96* 101 102 105  CO2 21* 20* 24 22  GLUCOSE 113* 103* 208* 201*  BUN 12 9 10 11   CREATININE 0.66 0.55 0.57 0.53  CALCIUM 8.4* 7.9* 8.7* 8.5*  MG 2.1  --   --   --     CBC: Recent Labs  Lab 02/01/24 1107 02/02/24 0427 02/03/24 0509 02/04/24 0419 02/05/24 0349  WBC 29.8* 29.8* 29.9* 32.2* 27.3*  NEUTROABS 24.1*  --   --   --   --   HGB 11.8* 11.0* 11.1* 10.7* 10.5*  HCT 36.2 34.8* 33.9* 34.3* 32.2*  MCV 88.5 91.6 88.5  91.5 89.4  PLT 436* 377 420* 453* 464*    LFT Recent Labs  Lab 02/01/24 1107 02/04/24 0419  AST 16 16  ALT 19 19  ALKPHOS 85 79  BILITOT 1.0 0.4  PROT 7.2 5.8*  ALBUMIN 3.1* 2.4*     Antibiotics: Anti-infectives (From admission, onward)    Start     Dose/Rate Route Frequency Ordered Stop   02/01/24 1700  levofloxacin (LEVAQUIN) IVPB 750 mg        750 mg 100 mL/hr over 90 Minutes Intravenous Every 24 hours 02/01/24 1604     02/01/24 1630  metroNIDAZOLE (FLAGYL) IVPB 500 mg        500 mg 100 mL/hr over 60 Minutes Intravenous Every 12 hours 02/01/24 1604     02/01/24 1400   metroNIDAZOLE (FLAGYL) IVPB 500 mg  Status:  Discontinued        500 mg 100 mL/hr over 60 Minutes Intravenous  Once 02/01/24 1319 02/01/24 1617   02/01/24 1330  ceFEPIme (MAXIPIME) 2 g in sodium chloride 0.9 % 100 mL IVPB        2 g 200 mL/hr over 30 Minutes Intravenous  Once 02/01/24 1319 02/01/24 1429        DVT prophylaxis: Lovenox  Code Status: Full code  Family Communication: No family at bedside   CONSULTS gastroenterology   Subjective   Continues to have intermittent rectal bleeding with mucus.  Hemoglobin has been stable   Objective    Physical Examination:  General-appears in no acute distress Heart-S1-S2, regular, no murmur auscultated Lungs-clear to auscultation bilaterally, no wheezing or crackles auscultated Abdomen-soft, nontender, no organomegaly Extremities-no edema in the lower extremities Neuro-alert, oriented x3, no focal deficit noted   Status is: Inpatient:             Meredeth Ide   [facility] If 7PM-7AM, please contact night-coverage at [email or web address], Office  [phone]   02/05/2024, 9:38 AM  LOS: 3 days

## 2024-02-05 NOTE — Progress Notes (Addendum)
 Subjective: Patient seems to be doing much better today.  She had 1 BM today with a slight trace of blood.  She is anxious to go home.  She denies having abdominal pain, nausea or vomiting.  Objective: Vital signs in last 24 hours: Temp:  [97.7 F (36.5 C)-98.4 F (36.9 C)] 98 F (36.7 C) (03/17 1235) Pulse Rate:  [72-87] 87 (03/17 1235) Resp:  [16-18] 18 (03/17 1235) BP: (139-149)/(75-90) 149/90 (03/17 1235) SpO2:  [97 %] 97 % (03/17 1235) Last BM Date : 02/04/24  Intake/Output from previous day: 03/16 0701 - 03/17 0700 In: 240 [P.O.:240] Out: -  Intake/Output this shift: Total I/O In: 240 [P.O.:240] Out: -   General appearance: alert, obese, cooperative, appears stated age Resp: clear to auscultation bilaterally Cardio: regular rate and rhythm, S1, S2 normal, no murmur, click, rub or gallop GI: soft, non-tender; bowel sounds normal; no masses,  no organomegaly  Lab Results: Recent Labs    02/03/24 0509 02/04/24 0419 02/05/24 0349  WBC 29.9* 32.2* 27.3*  HGB 11.1* 10.7* 10.5*  HCT 33.9* 34.3* 32.2*  PLT 420* 453* 464*   BMET Recent Labs    02/03/24 0509 02/04/24 0419  NA 134* 136  K 4.2 3.9  CL 102 105  CO2 24 22  GLUCOSE 208* 201*  BUN 10 11  CREATININE 0.57 0.53  CALCIUM 8.7* 8.5*   LFT Recent Labs    02/04/24 0419  PROT 5.8*  ALBUMIN 2.4*  AST 16  ALT 19  ALKPHOS 79  BILITOT 0.4   Hepatitis Panel Recent Labs    02/02/24 1457  HEPBSAG NON REACTIVE   Studies/Results: No results found.  Medications: I have reviewed the patient's current medications. Prior to Admission:  Medications Prior to Admission  Medication Sig Dispense Refill Last Dose/Taking   acetaminophen (TYLENOL) 500 MG tablet Take 500 mg by mouth at bedtime.   Taking   atorvastatin (LIPITOR) 40 MG tablet Take 1 tablet (40 mg total) by mouth daily. 90 tablet 0 01/31/2024   [EXPIRED] azithromycin (ZITHROMAX) 500 MG tablet Take 1 tablet (500 mg total) by mouth daily for 5 days.  Take first 2 tablets together, then 1 every day until finished. 5 tablet 0 01/31/2024   Calcium Polycarbophil (FIBER-CAPS PO) Take 5 g by mouth in the morning and at bedtime.   Past Week   CALCIUM-VITAMIN D PO Take 2 tablets by mouth daily.   Past Week   Glucosamine HCl-MSM (GLUCOSAMINE-MSM PO) Take 1 tablet by mouth daily.   Past Week   ibuprofen (ADVIL) 200 MG tablet Take 200 mg by mouth every 6 (six) hours as needed for headache or moderate pain.   Taking As Needed   Multiple Vitamins-Minerals (MULTIVITAMIN WITH MINERALS) tablet Take 1 tablet by mouth daily. Women 50+   Past Week   Multiple Vitamins-Minerals (PRESERVISION AREDS 2) CAPS Take 1 tablet by mouth daily.   Past Week   Omega-3 Fatty Acids (FISH OIL PO) Take 1,400 mg by mouth daily. Omega3 980 mg   Past Week   Turmeric 500 MG CAPS Take 1,500 mg by mouth in the morning, at noon, and at bedtime.    Past Week   aspirin 81 MG chewable tablet Chew 1 tablet (81 mg total) by mouth 2 (two) times daily. (Patient not taking: Reported on 02/01/2024) 30 tablet 0 Not Taking   COLLAGEN PO Take 6,000 mg by mouth 4 (four) times daily. Biotin (Patient not taking: Reported on 02/01/2024)   Not Taking  Scheduled:  enoxaparin (LOVENOX) injection  40 mg Subcutaneous Q24H   famotidine  20 mg Oral Daily   methylPREDNISolone (SOLU-MEDROL) injection  40 mg Intravenous Q12H   Continuous:  levofloxacin (LEVAQUIN) IV 750 mg (02/04/24 1941)   metronidazole 500 mg (02/05/24 1518)   WUJ:[id number] **OR** acetaminophen, albuterol, alum & mag hydroxide-simeth, magnesium citrate, ondansetron **OR** ondansetron (ZOFRAN) IV  Assessment/Plan: 1) Left-sided colitis noted on flexible sigmoidoscopy-as per discussion with Dr. Sharl Ma the patient will be sent home on a prednisone taper and will see Dr. Elnoria Howard in the next 2 weeks for further decisions and recommendations on what medications to start for her UC. Biopsy results are still pending.  Avoidance of nonsteroidals  encouraged. 2) Rectal bleeding with mild anemia. 3) Complex right hepatic lesion will need further workup with MRI on outpatient basis. 4) Hyperlipidemia. 5) Leukocytosis probably secondary to steroid use.   LOS: 3 days   Nancy Eaton 02/05/2024, 2:53 PM

## 2024-02-05 NOTE — Discharge Instructions (Signed)
 Complex right hepatic lesion -Seen on abdominal ultrasound -Will need MRI with and without gadolinium -Can be done as outpatient

## 2024-02-06 ENCOUNTER — Encounter (HOSPITAL_COMMUNITY): Payer: Self-pay | Admitting: Gastroenterology

## 2024-02-07 LAB — QUANTIFERON-TB GOLD PLUS: QuantiFERON-TB Gold Plus: UNDETERMINED — AB

## 2024-02-07 LAB — QUANTIFERON-TB GOLD PLUS (RQFGPL)
QuantiFERON Mitogen Value: 0.02 [IU]/mL
QuantiFERON Nil Value: 0.01 [IU]/mL
QuantiFERON TB1 Ag Value: 0 [IU]/mL
QuantiFERON TB2 Ag Value: 0 [IU]/mL

## 2024-02-14 ENCOUNTER — Other Ambulatory Visit: Payer: Self-pay

## 2024-02-14 ENCOUNTER — Encounter (HOSPITAL_COMMUNITY): Payer: Self-pay

## 2024-02-14 ENCOUNTER — Emergency Department (HOSPITAL_COMMUNITY)

## 2024-02-14 ENCOUNTER — Inpatient Hospital Stay (HOSPITAL_COMMUNITY)
Admission: EM | Admit: 2024-02-14 | Discharge: 2024-02-20 | DRG: 385 | Disposition: A | Discharge status: Home / Self Care | Attending: Internal Medicine | Admitting: Internal Medicine

## 2024-02-14 DIAGNOSIS — T380X5A Adverse effect of glucocorticoids and synthetic analogues, initial encounter: Secondary | ICD-10-CM | POA: Diagnosis present

## 2024-02-14 DIAGNOSIS — Z811 Family history of alcohol abuse and dependence: Secondary | ICD-10-CM

## 2024-02-14 DIAGNOSIS — K769 Liver disease, unspecified: Secondary | ICD-10-CM | POA: Diagnosis present

## 2024-02-14 DIAGNOSIS — Z6830 Body mass index (BMI) 30.0-30.9, adult: Secondary | ICD-10-CM

## 2024-02-14 DIAGNOSIS — K5731 Diverticulosis of large intestine without perforation or abscess with bleeding: Secondary | ICD-10-CM | POA: Diagnosis present

## 2024-02-14 DIAGNOSIS — D72829 Elevated white blood cell count, unspecified: Secondary | ICD-10-CM | POA: Diagnosis present

## 2024-02-14 DIAGNOSIS — R112 Nausea with vomiting, unspecified: Secondary | ICD-10-CM | POA: Diagnosis present

## 2024-02-14 DIAGNOSIS — K51511 Left sided colitis with rectal bleeding: Secondary | ICD-10-CM | POA: Diagnosis not present

## 2024-02-14 DIAGNOSIS — Z79899 Other long term (current) drug therapy: Secondary | ICD-10-CM

## 2024-02-14 DIAGNOSIS — R61 Generalized hyperhidrosis: Secondary | ICD-10-CM | POA: Diagnosis present

## 2024-02-14 DIAGNOSIS — E66811 Obesity, class 1: Secondary | ICD-10-CM | POA: Diagnosis present

## 2024-02-14 DIAGNOSIS — K51919 Ulcerative colitis, unspecified with unspecified complications: Principal | ICD-10-CM

## 2024-02-14 DIAGNOSIS — D638 Anemia in other chronic diseases classified elsewhere: Secondary | ICD-10-CM | POA: Diagnosis present

## 2024-02-14 DIAGNOSIS — R739 Hyperglycemia, unspecified: Secondary | ICD-10-CM | POA: Diagnosis present

## 2024-02-14 DIAGNOSIS — K5190 Ulcerative colitis, unspecified, without complications: Secondary | ICD-10-CM | POA: Diagnosis present

## 2024-02-14 DIAGNOSIS — B001 Herpesviral vesicular dermatitis: Secondary | ICD-10-CM | POA: Diagnosis present

## 2024-02-14 DIAGNOSIS — Z818 Family history of other mental and behavioral disorders: Secondary | ICD-10-CM

## 2024-02-14 DIAGNOSIS — R7303 Prediabetes: Secondary | ICD-10-CM | POA: Diagnosis present

## 2024-02-14 DIAGNOSIS — I10 Essential (primary) hypertension: Secondary | ICD-10-CM | POA: Diagnosis present

## 2024-02-14 DIAGNOSIS — Z96651 Presence of right artificial knee joint: Secondary | ICD-10-CM | POA: Diagnosis present

## 2024-02-14 DIAGNOSIS — E871 Hypo-osmolality and hyponatremia: Secondary | ICD-10-CM | POA: Diagnosis not present

## 2024-02-14 DIAGNOSIS — Z825 Family history of asthma and other chronic lower respiratory diseases: Secondary | ICD-10-CM

## 2024-02-14 DIAGNOSIS — E860 Dehydration: Secondary | ICD-10-CM | POA: Diagnosis present

## 2024-02-14 DIAGNOSIS — Z880 Allergy status to penicillin: Secondary | ICD-10-CM

## 2024-02-14 DIAGNOSIS — Z8249 Family history of ischemic heart disease and other diseases of the circulatory system: Secondary | ICD-10-CM

## 2024-02-14 DIAGNOSIS — E876 Hypokalemia: Secondary | ICD-10-CM | POA: Diagnosis present

## 2024-02-14 DIAGNOSIS — E785 Hyperlipidemia, unspecified: Secondary | ICD-10-CM | POA: Diagnosis present

## 2024-02-14 DIAGNOSIS — K5900 Constipation, unspecified: Secondary | ICD-10-CM | POA: Diagnosis present

## 2024-02-14 LAB — CBC WITH DIFFERENTIAL/PLATELET
Abs Immature Granulocytes: 0.12 10*3/uL — ABNORMAL HIGH (ref 0.00–0.07)
Basophils Absolute: 0.1 10*3/uL (ref 0.0–0.1)
Basophils Relative: 1 %
Eosinophils Absolute: 0 10*3/uL (ref 0.0–0.5)
Eosinophils Relative: 0 %
HCT: 32.3 % — ABNORMAL LOW (ref 36.0–46.0)
Hemoglobin: 10.3 g/dL — ABNORMAL LOW (ref 12.0–15.0)
Immature Granulocytes: 1 %
Lymphocytes Relative: 13 %
Lymphs Abs: 1.7 10*3/uL (ref 0.7–4.0)
MCH: 28.5 pg (ref 26.0–34.0)
MCHC: 31.9 g/dL (ref 30.0–36.0)
MCV: 89.5 fL (ref 80.0–100.0)
Monocytes Absolute: 1.4 10*3/uL — ABNORMAL HIGH (ref 0.1–1.0)
Monocytes Relative: 11 %
Neutro Abs: 9.8 10*3/uL — ABNORMAL HIGH (ref 1.7–7.7)
Neutrophils Relative %: 74 %
Platelets: 522 10*3/uL — ABNORMAL HIGH (ref 150–400)
RBC: 3.61 MIL/uL — ABNORMAL LOW (ref 3.87–5.11)
RDW: 14.4 % (ref 11.5–15.5)
WBC: 13.1 10*3/uL — ABNORMAL HIGH (ref 4.0–10.5)
nRBC: 0 % (ref 0.0–0.2)

## 2024-02-14 LAB — TYPE AND SCREEN
ABO/RH(D): O NEG
Antibody Screen: NEGATIVE

## 2024-02-14 LAB — COMPREHENSIVE METABOLIC PANEL
ALT: 26 U/L (ref 0–44)
AST: 16 U/L (ref 15–41)
Albumin: 2.6 g/dL — ABNORMAL LOW (ref 3.5–5.0)
Alkaline Phosphatase: 77 U/L (ref 38–126)
Anion gap: 11 (ref 5–15)
BUN: 12 mg/dL (ref 8–23)
CO2: 27 mmol/L (ref 22–32)
Calcium: 8.4 mg/dL — ABNORMAL LOW (ref 8.9–10.3)
Chloride: 97 mmol/L — ABNORMAL LOW (ref 98–111)
Creatinine, Ser: 0.61 mg/dL (ref 0.44–1.00)
GFR, Estimated: >60 mL/min (ref >60)
Glucose, Bld: 120 mg/dL — ABNORMAL HIGH (ref 70–99)
Potassium: 3.2 mmol/L — ABNORMAL LOW (ref 3.5–5.1)
Sodium: 135 mmol/L (ref 135–145)
Total Bilirubin: 0.9 mg/dL (ref 0.0–1.2)
Total Protein: 6.4 g/dL — ABNORMAL LOW (ref 6.5–8.1)

## 2024-02-14 LAB — URINALYSIS, ROUTINE W REFLEX MICROSCOPIC
Bacteria, UA: NONE SEEN
Glucose, UA: NEGATIVE mg/dL
Hgb urine dipstick: NEGATIVE
Ketones, ur: NEGATIVE mg/dL
Leukocytes,Ua: NEGATIVE
Nitrite: NEGATIVE
Protein, ur: 30 mg/dL — AB
Specific Gravity, Urine: 1.026 (ref 1.005–1.030)
pH: 5 (ref 5.0–8.0)

## 2024-02-14 LAB — LIPASE, BLOOD: Lipase: 19 U/L (ref 11–51)

## 2024-02-14 MED ORDER — METHYLPREDNISOLONE SODIUM SUCC 125 MG IJ SOLR
60.0000 mg | Freq: Once | INTRAMUSCULAR | Status: AC
  Administered 2024-02-14: 60 mg via INTRAVENOUS
  Filled 2024-02-14: qty 2

## 2024-02-14 MED ORDER — MORPHINE SULFATE (PF) 2 MG/ML IV SOLN: 2.0000 mg | INTRAVENOUS | Status: DC | PRN

## 2024-02-14 MED ORDER — MORPHINE SULFATE (PF) 4 MG/ML IV SOLN
4.0000 mg | Freq: Once | INTRAVENOUS | Status: AC
  Administered 2024-02-14: 4 mg via INTRAVENOUS
  Filled 2024-02-14: qty 1

## 2024-02-14 MED ORDER — BISACODYL 5 MG PO TBEC: 5.0000 mg | DELAYED_RELEASE_TABLET | Freq: Every day | ORAL | Status: DC | PRN

## 2024-02-14 MED ORDER — ONDANSETRON HCL 4 MG/2ML IJ SOLN
4.0000 mg | Freq: Four times a day (QID) | INTRAMUSCULAR | Status: DC | PRN
  Administered 2024-02-16 – 2024-02-17 (×2): 4 mg via INTRAVENOUS
  Filled 2024-02-14 (×2): qty 2

## 2024-02-14 MED ORDER — ACETAMINOPHEN 650 MG RE SUPP: 650.0000 mg | Freq: Four times a day (QID) | RECTAL | Status: DC | PRN

## 2024-02-14 MED ORDER — ONDANSETRON HCL 4 MG/2ML IJ SOLN
4.0000 mg | Freq: Once | INTRAMUSCULAR | Status: AC
  Administered 2024-02-14: 4 mg via INTRAVENOUS
  Filled 2024-02-14: qty 2

## 2024-02-14 MED ORDER — ACETAMINOPHEN 325 MG PO TABS
650.0000 mg | ORAL_TABLET | Freq: Four times a day (QID) | ORAL | Status: DC | PRN
  Administered 2024-02-17 – 2024-02-19 (×2): 650 mg via ORAL
  Filled 2024-02-14 (×2): qty 2

## 2024-02-14 MED ORDER — POTASSIUM CHLORIDE CRYS ER 20 MEQ PO TBCR
40.0000 meq | EXTENDED_RELEASE_TABLET | Freq: Once | ORAL | Status: AC
  Administered 2024-02-14: 40 meq via ORAL
  Filled 2024-02-14: qty 2

## 2024-02-14 MED ORDER — ENOXAPARIN SODIUM 40 MG/0.4ML IJ SOSY
40.0000 mg | PREFILLED_SYRINGE | INTRAMUSCULAR | Status: DC
  Administered 2024-02-15 – 2024-02-19 (×5): 40 mg via SUBCUTANEOUS
  Filled 2024-02-14 (×5): qty 0.4

## 2024-02-14 MED ORDER — HYDROCODONE-ACETAMINOPHEN 5-325 MG PO TABS
1.0000 | ORAL_TABLET | ORAL | Status: DC | PRN
  Administered 2024-02-16: 1 via ORAL
  Administered 2024-02-17: 2 via ORAL
  Administered 2024-02-18: 1 via ORAL
  Administered 2024-02-18: 2 via ORAL
  Filled 2024-02-14: qty 2
  Filled 2024-02-14: qty 1
  Filled 2024-02-14: qty 2
  Filled 2024-02-14 (×2): qty 1

## 2024-02-14 MED ORDER — LOSARTAN POTASSIUM 50 MG PO TABS
50.0000 mg | ORAL_TABLET | Freq: Every day | ORAL | Status: DC
  Administered 2024-02-15 – 2024-02-20 (×6): 50 mg via ORAL
  Filled 2024-02-14 (×6): qty 1

## 2024-02-14 MED ORDER — SENNOSIDES-DOCUSATE SODIUM 8.6-50 MG PO TABS: 1.0000 | ORAL_TABLET | Freq: Every evening | ORAL | Status: DC | PRN

## 2024-02-14 MED ORDER — SODIUM CHLORIDE 0.9 % IV BOLUS
1000.0000 mL | Freq: Once | INTRAVENOUS | Status: AC
  Administered 2024-02-14: 1000 mL via INTRAVENOUS

## 2024-02-14 MED ORDER — ONDANSETRON HCL 4 MG PO TABS
4.0000 mg | ORAL_TABLET | Freq: Four times a day (QID) | ORAL | Status: DC | PRN
  Administered 2024-02-17 – 2024-02-18 (×2): 4 mg via ORAL
  Filled 2024-02-14 (×2): qty 1

## 2024-02-14 MED ORDER — IOHEXOL 300 MG/ML  SOLN
100.0000 mL | Freq: Once | INTRAMUSCULAR | Status: AC | PRN
Start: 2024-02-14 — End: 2024-02-14
  Administered 2024-02-14: 100 mL via INTRAVENOUS

## 2024-02-14 MED ORDER — ATORVASTATIN CALCIUM 20 MG PO TABS
40.0000 mg | ORAL_TABLET | Freq: Every day | ORAL | Status: DC
  Administered 2024-02-15 – 2024-02-19 (×5): 40 mg via ORAL
  Filled 2024-02-14 (×5): qty 2

## 2024-02-14 MED ORDER — LACTATED RINGERS IV SOLN: INTRAVENOUS | Status: AC

## 2024-02-14 NOTE — Hospital Course (Addendum)
 Nancy Eaton is a 68 y.o. female with medical history significant for recently diagnosed left-sided ulcerative colitis, HTN, HLD who is admitted for management of ulcerative colitis.

## 2024-02-14 NOTE — ED Provider Triage Note (Signed)
 Emergency Medicine Provider Triage Evaluation Note  Nancy Eaton , a 68 y.o. female  was evaluated in triage.  Pt complains of lower abdominal pain. The patient was recently diagnosed with ulcerative colitis.  She was started on rectal suppositories, mesalamine, and prednisone.  She thinks the bleeding has improved some but the pain has been constant.  Denies any worsening of this however.  Nausea but no vomiting.  Reports last bowel movement was yesterday.  She reports that she is passing bloody mucus.  Has had a decrease in appetite.  No fevers.  No urinary or vaginal symptoms.  She sees Dr. Elnoria Howard with GI.  Review of Systems  Positive:  Negative:   Physical Exam  BP (!) 143/84   Pulse 80   Temp 98 F (36.7 C) (Oral)   Resp 18   Ht 5\' 3"  (1.6 m)   Wt 84 kg   SpO2 99%   BMI 32.80 kg/m  Gen:   Awake, no distress   Resp:  Normal effort  MSK:   Moves extremities without difficulty  Other:  Dry mucous membranes.  Abdomen soft with some lower tenderness.  No guarding or rebound.  Medical Decision Making  Medically screening exam initiated at 7:42 PM.  Appropriate orders placed.  Nancy Eaton was informed that the remainder of the evaluation will be completed by another provider, this initial triage assessment does not replace that evaluation, and the importance of remaining in the ED until their evaluation is complete.  Labs ordered.   Achille Rich, [place] 02/14/24 2234

## 2024-02-14 NOTE — ED Triage Notes (Signed)
 Pt diagnosed with ulcerative colitis a week ago and placed on medications by PCP. Pt is not feeling any better. Nausea, no vomiting or diarrhea.

## 2024-02-14 NOTE — ED Provider Notes (Signed)
 [place] EMERGENCY DEPARTMENT AT [facility] Provider Note   CSN: [id number] Arrival date & time: 02/14/24  1854     History  Chief Complaint  Patient presents with   Abdominal Pain    Nancy Eaton is a 68 y.o. female.  HPI 68 year old female with a history of ulcerative colitis presents with continued abdominal pain.  She continues to have bloody bowel movements and tenesmus.  She is having lower abdominal pain.  She has had some chills but does not think she has had a fever.  She has been following with Dr. Elnoria Howard who has her on steroids but also encouraged her to come back to the hospital if not improving.  Home Medications Prior to Admission medications   Medication Sig Start Date End Date Taking? Authorizing Provider  acetaminophen (TYLENOL) 500 MG tablet Take 500 mg by mouth at bedtime.    [provider]  atorvastatin (LIPITOR) 40 MG tablet Take 1 tablet (40 mg total) by mouth daily. 02/25/19   McGowen, Maryjean Morn, MD  CALCIUM-VITAMIN D PO Take 2 tablets by mouth daily.    [provider]  Glucosamine HCl-MSM (GLUCOSAMINE-MSM PO) Take 1 tablet by mouth daily.    [provider]  Multiple Vitamins-Minerals (MULTIVITAMIN WITH MINERALS) tablet Take 1 tablet by mouth daily. Women 50+    [provider]  Multiple Vitamins-Minerals (PRESERVISION AREDS 2) CAPS Take 1 tablet by mouth daily.    [provider]  Omega-3 Fatty Acids (FISH OIL PO) Take 1,400 mg by mouth daily. Omega3 980 mg    [provider]  predniSONE (DELTASONE) 10 MG tablet Prednisone 40 mg po daily x 1 week then Prednisone 30 mg po daily x 1 week then Prednisone 20 mg po daily x 1 week then Prednisone 10 mg daily x 1 week then Prednisone 5 mg po daily x 1 week, then stop 02/06/24   Meredeth Ide, MD  Turmeric 500 MG CAPS Take 1,500 mg by mouth in the morning, at noon, and at bedtime.     [provider]      Allergies    Penicillins     Review of Systems   Review of Systems  Constitutional:  Positive for chills. Negative for fever.  Gastrointestinal:  Positive for abdominal pain, blood in stool, constipation and rectal pain.    Physical Exam Updated Vital Signs BP (!) 143/84   Pulse 82   Temp 98 F (36.7 C) (Oral)   Resp 18   Ht 5\' 3"  (1.6 m)   Wt 84 kg   SpO2 99%   BMI 32.80 kg/m  Physical Exam Vitals and nursing note reviewed.  Constitutional:      General: She is not in acute distress.    Appearance: She is well-developed. She is not ill-appearing or diaphoretic.  HENT:     Head: Normocephalic and atraumatic.  Cardiovascular:     Rate and Rhythm: Normal rate and regular rhythm.     Heart sounds: Normal heart sounds.  Pulmonary:     Effort: Pulmonary effort is normal.  Abdominal:     Palpations: Abdomen is soft.     Tenderness: There is abdominal tenderness in the right lower quadrant.  Skin:    General: Skin is warm and dry.  Neurological:     Mental Status: She is alert.     ED Results / Procedures / Treatments   Labs (all labs ordered are listed, but only abnormal results are displayed)  Labs Reviewed  CBC WITH DIFFERENTIAL/PLATELET - Abnormal; Notable for the following components:      Result Value   WBC 13.1 (*)    RBC 3.61 (*)    Hemoglobin 10.3 (*)    HCT 32.3 (*)    Platelets 522 (*)    Neutro Abs 9.8 (*)    Monocytes Absolute 1.4 (*)    Abs Immature Granulocytes 0.12 (*)    All other components within normal limits  COMPREHENSIVE METABOLIC PANEL - Abnormal; Notable for the following components:   Potassium 3.2 (*)    Chloride 97 (*)    Glucose, Bld 120 (*)    Calcium 8.4 (*)    Total Protein 6.4 (*)    Albumin 2.6 (*)    All other components within normal limits  URINALYSIS, ROUTINE W REFLEX MICROSCOPIC - Abnormal; Notable for the following components:   APPearance HAZY (*)    Bilirubin Urine SMALL (*)    Protein, ur 30 (*)    All other components within normal limits   LIPASE, BLOOD  TYPE AND SCREEN    EKG None  Radiology CT ABDOMEN PELVIS W CONTRAST Result Date: 02/14/2024 CLINICAL DATA:  Lower abdominal pain with nausea. Recent diagnosis of ulcerative colitis. EXAM: CT ABDOMEN AND PELVIS WITH CONTRAST TECHNIQUE: Multidetector CT imaging of the abdomen and pelvis was performed using the standard protocol following bolus administration of intravenous contrast. RADIATION DOSE REDUCTION: This exam was performed according to the departmental dose-optimization program which includes automated exposure control, adjustment of the mA and/or kV according to patient size and/or use of iterative reconstruction technique. CONTRAST:  OMNIPAQUE IOHEXOL 300 MG/ML  SOLN COMPARISON:  02/01/2024. FINDINGS: Lower chest: Atelectasis or scarring is noted at the lung bases, greater on the left than on the right. Hepatobiliary: There is a complex irregular hypodensity in the right lobe of the liver measuring 2.8 x 2.5 cm. Fatty infiltration of the liver is noted. The gallbladder is without stones. No biliary ductal dilatation is seen. Pancreas: Unremarkable. No pancreatic ductal dilatation or surrounding inflammatory changes. Spleen: Normal in size without focal abnormality. Adrenals/Urinary Tract: The adrenal glands are within normal limits. The kidneys enhance symmetrically. No renal calculus or hydronephrosis bilaterally. Subcentimeter hypodensities are present in the left kidney which are too small to further characterize. The bladder is unremarkable. Stomach/Bowel: The stomach is within normal limits. No bowel obstruction, free air, or pneumatosis is seen. There is colonic wall thickening with surrounding fat stranding involving the descending and sigmoid colon and rectum. Scattered diverticula are noted along the colon without evidence of diverticulitis. There is a large quantity of stool in the ascending and transverse colon. The appendix is not seen. Vascular/Lymphatic: Aortic  atherosclerosis. Prominent lymph nodes are present in the retroperitoneum and mesentery, which may be reactive. Reproductive: Uterus and bilateral adnexa are unremarkable. Other: Small amount of free fluid is noted in the right pericolic gutter. There is a fat containing umbilical hernia. Musculoskeletal: Bilateral breast implants are noted degenerative changes are present in the thoracolumbar spine. No acute osseous abnormality. IMPRESSION: 1. Colonic wall thickening with surrounding fat stranding involving the descending colon, sigmoid colon, and rectum, suggesting infectious or inflammatory colitis and may be associated with patient's history of ulcerative colitis. Findings are not significantly changed from the previous exam. 2. Diverticulosis without diverticulitis. 3. Indeterminate complex hypodensity in the right lobe of the liver. Nonemergent MRI with contrast is recommended for further characterization. 4. Hepatic steatosis. 5. Aortic atherosclerosis. Electronically Signed  By: Thornell Sartorius M.D.   On: 02/14/2024 22:02    Procedures Procedures    Medications Ordered in ED Medications  methylPREDNISolone sodium succinate (SOLU-MEDROL) 125 mg/2 mL injection 60 mg (has no administration in time range)  morphine (PF) 4 MG/ML injection 4 mg (4 mg Intravenous Given 02/14/24 2049)  ondansetron (ZOFRAN) injection 4 mg (4 mg Intravenous Given 02/14/24 2049)  sodium chloride 0.9 % bolus 1,000 mL (1,000 mLs Intravenous New Bag/Given 02/14/24 2048)  iohexol (OMNIPAQUE) 300 MG/ML solution 100 mL (100 mLs Intravenous Contrast Given 02/14/24 2128)    ED Course/ Medical Decision Making/ A&P                                 Medical Decision Making Amount and/or Complexity of Data Reviewed Labs:     Details: Leukocytosis but improving from a couple weeks ago Radiology: independent interpretation performed.    Details: Colitis  Risk Prescription drug management. Decision regarding  hospitalization.   Patient presents with continued symptoms.  Still having bloody and mucousy stools as well as pain and tenesmus.  Discussed results with Dr. Loreta Ave, who recommends IV Solu-Medrol of 60 mg once and they will see in the morning as a GI consultation.  Discussed with Dr. Allena Katz for admission.  Given IV morphine for pain.        Final Clinical Impression(s) / ED Diagnoses Final diagnoses:  Ulcerative colitis with complication, unspecified location [facility])    Rx / DC Orders ED Discharge Orders     None         Pricilla Loveless, MD 02/14/24 2245

## 2024-02-14 NOTE — H&P (Signed)
 History and Physical    Nancy Eaton HYQ:[id number] DOB: [birth year] DOA: 02/14/2024  PCP: Brayton El, PA-C  Patient coming from: Home  I have personally briefly reviewed patient's old medical records in [facility]  Chief Complaint: Abdominal pain  HPI: Nancy Eaton is a 68 y.o. female with medical history significant for recently diagnosed left-sided ulcerative colitis, HTN, HLD who presented to the ED for evaluation of abdominal pain.  Patient was recently admitted 3/13-3/17 for severe constipation and colitis.  She underwent flexible sigmoidoscopy on 3/14 which was consistent with severe left-sided ulcerative colitis.  Biopsy also consistent with ulcerative colitis.  C. difficile was negative.  GI pathogen panel was positive for rotavirus.  She was treated with IV Solu-Medrol 40 mg q12h, IV Levaquin and Flagyl while in the hospital.  She was discharged on 5-week prednisone taper.  Patient states that she has not really felt much better since leaving the hospital.  She has continued cramping abdominal pain across her lower abdomen.  She has had significant nausea and dry heaves limiting her oral intake.  She has had intermittent chills and diaphoresis.  She reports that her bowel movements consist of mucousy stool with some blood mixed in including clots.  She states that she has been taking prednisone as prescribed, initially 40 mg for 1 week now on 30 mg.  ED Course  Labs/Imaging on admission: I have personally reviewed following labs and imaging studies.  Initial vitals showed BP 143/84, pulse 82, RR 18, temp 98.0 F, SpO2 99% on room air.  Labs showed WBC 13.1, hemoglobin 10.3, platelets 522,000, sodium 135, potassium 3.2, bicarb 27, BUN 12, creatinine 0.61, serum glucose 120, LFTs within normal limits, lipase 19.  UA negative for UTI.  CT abdomen/pelvis with contrast showed colonic wall thickening with surrounding fat stranding involving the descending  colon, sigmoid colon, and rectum suggesting colitis.  Not significantly changed from previous exam.  Diverticulosis without diverticulitis noted.  Indeterminate complex hypodensity in the right lobe of the liver again seen.  Patient was given 1 L normal saline, IV morphine, IV Zofran, oral K 40 mEq.  EDP discussed with GI, Dr. Loreta Ave, who recommended medical admission, give IV Solu-Medrol 60 mg once, and they will see in the morning.  The hospitalist service was consulted to admit.  Review of Systems: All systems reviewed and are negative except as documented in history of present illness above.   Past Medical History:  Diagnosis Date   Arthritis    Knee   Hyperlipidemia    Lovastatin started approx 2011   IFG (impaired fasting glucose) 2018   HbA1c 5.9% (stable)   PONV (postoperative nausea and vomiting)    Pre-diabetes    Right knee pain 12/2019   End stage: TKA recommended by ortho 01/2020->pt considering   Sigmoid diverticulosis 04/2010   "     "     "    "    Past Surgical History:  Procedure Laterality Date   BREAST ENHANCEMENT SURGERY     As of mammogram 12/2017--rupture of implants stable.   CARPAL TUNNEL RELEASE     CESAREAN SECTION     COLONOSCOPY  age 80   [facility]--recall 10 yrs   FLEXIBLE SIGMOIDOSCOPY N/A 02/02/2024   Procedure: Arnell Sieving;  Surgeon: Jeani Hawking, MD;  Location: [facility];  Service: Gastroenterology;  Laterality: N/A;   TOTAL KNEE ARTHROPLASTY Right 07/17/2020   Procedure: RIGHT TOTAL KNEE ARTHROPLASTY;  Surgeon: Kathryne Hitch,  MD;  Location: [facility];  Service: Orthopedics;  Laterality: Right;    Social History: Social History   Tobacco Use   Smoking status: Never   Smokeless tobacco: Never  Vaping Use   Vaping status: Never Used  Substance Use Topics   Alcohol use: Yes    Comment: socially   Drug use: No    Allergies  Allergen Reactions   Penicillins Rash    20 years   Did it involve swelling of the  face/tongue/throat, SOB, or low BP? No Did it involve sudden or severe rash/hives, skin peeling, or any reaction on the inside of your mouth or nose? Yes Did you need to seek medical attention at a hospital or doctor's office? No When did it last happen? Over 20 Years       If all above answers are "NO", may proceed with cephalosporin use.      Family History  Problem Relation Age of Onset   Cancer Mother        Brain   Alcohol abuse Father    COPD Brother    ADD / ADHD Son    Anxiety disorder Son    Heart disease Brother      Prior to Admission medications   Medication Sig Start Date End Date Taking? Authorizing Provider  acetaminophen (TYLENOL) 500 MG tablet Take 500 mg by mouth at bedtime.    [provider]  atorvastatin (LIPITOR) 40 MG tablet Take 1 tablet (40 mg total) by mouth daily. 02/25/19   McGowen, Maryjean Morn, MD  CALCIUM-VITAMIN D PO Take 2 tablets by mouth daily.    [provider]  Glucosamine HCl-MSM (GLUCOSAMINE-MSM PO) Take 1 tablet by mouth daily.    [provider]  Multiple Vitamins-Minerals (MULTIVITAMIN WITH MINERALS) tablet Take 1 tablet by mouth daily. Women 50+    [provider]  Multiple Vitamins-Minerals (PRESERVISION AREDS 2) CAPS Take 1 tablet by mouth daily.    [provider]  Omega-3 Fatty Acids (FISH OIL PO) Take 1,400 mg by mouth daily. Omega3 980 mg    [provider]  predniSONE (DELTASONE) 10 MG tablet Prednisone 40 mg po daily x 1 week then Prednisone 30 mg po daily x 1 week then Prednisone 20 mg po daily x 1 week then Prednisone 10 mg daily x 1 week then Prednisone 5 mg po daily x 1 week, then stop 02/06/24   Meredeth Ide, MD  Turmeric 500 MG CAPS Take 1,500 mg by mouth in the morning, at noon, and at bedtime.     [provider]    Physical Exam: Vitals:   02/14/24 1930 02/14/24 1932 02/14/24 1945 02/14/24 2319  BP:  (!) 143/84  (!) 146/81  Pulse: 79 80 82 86  Resp:  18  16   Temp:  98 F (36.7 C)  98.1 F (36.7 C)  TempSrc:  Oral  Oral  SpO2: 99% 99% 99% 95%  Weight:  84 kg    Height:  5\' 3"  (1.6 m)     Constitutional: Resting in bed.  Appears fatigued but in NAD, calm. Eyes: EOMI, lids and conjunctivae normal ENMT: Mucous membranes are dry. Posterior pharynx clear of any exudate or lesions.Normal dentition.  Neck: normal, supple, no masses. Respiratory: clear to auscultation bilaterally, no wheezing, no crackles. Normal respiratory effort. No accessory muscle use.  Cardiovascular: Regular rate and rhythm, no murmurs / rubs / gallops. No extremity edema. 2+ pedal pulses. Abdomen: no tenderness after receiving morphine,  no masses palpated. Musculoskeletal: no clubbing / cyanosis. No joint deformity upper and lower extremities. Good ROM, no contractures. Normal muscle tone.  Skin: no rashes, lesions, ulcers. No induration Neurologic: Sensation intact. Strength 5/5 in all 4.  Psychiatric: Normal judgment and insight. Alert and oriented x 3. Normal mood.   EKG: Not performed.  Assessment/Plan Principal Problem:   Ulcerative colitis, left sided, with rectal bleeding (HCC) Active Problems:   Hyperlipidemia   Hepatic lesion   Hypokalemia   Yenty Bloch is a 68 y.o. female with medical history significant for recently diagnosed left-sided ulcerative colitis, HTN, HLD who is admitted for management of ulcerative colitis.  Assessment and Plan: Ulcerative colitis: New diagnosis on recent admission, returns with persistent symptoms despite prednisone taper.  CT shows similar left-sided colitis findings.  She is dehydrated from not being able to maintain adequate oral intake. -GI Dr. Loreta Ave to see in a.m. -IV Solu-Medrol 60 mg once now -Continue IV fluid hydration overnight -Continue IV antiemetics and analgesics as needed  Rectal bleeding with mild anemia: In setting of ulcerative colitis.  Hemoglobin is stable on admission.  Continue to  monitor.  Complex right hepatic lesion: Again noted on CT imaging.  Further workup with MRI on outpatient basis recommended.  Leukocytosis: Likely related to steroid use.  Continue to monitor.  Hypokalemia: Supplementing.  Hypertension: Continue losartan.  Hyperlipidemia: Continue atorvastatin.    DVT prophylaxis: enoxaparin (LOVENOX) injection 40 mg Start: 02/15/24 2200 Code Status: Full code, confirmed with patient on admission Family Communication: Sister at bedside Disposition Plan: From home and likely discharge to home pending clinical progress Consults called: GI Dr. Loreta Ave Severity of Illness: The appropriate patient status for this patient is OBSERVATION. Observation status is judged to be reasonable and necessary in order to provide the required intensity of service to ensure the patient's safety. The patient's presenting symptoms, physical exam findings, and initial radiographic and laboratory data in the context of their medical condition is felt to place them at decreased risk for further clinical deterioration. Furthermore, it is anticipated that the patient will be medically stable for discharge from the hospital within 2 midnights of admission.   Darreld Mclean MD [facility]  If 7PM-7AM, please contact night-coverage [email or web address]  02/14/2024, 11:20 PM

## 2024-02-15 DIAGNOSIS — Z825 Family history of asthma and other chronic lower respiratory diseases: Secondary | ICD-10-CM | POA: Diagnosis not present

## 2024-02-15 DIAGNOSIS — Z96651 Presence of right artificial knee joint: Secondary | ICD-10-CM | POA: Diagnosis present

## 2024-02-15 DIAGNOSIS — E871 Hypo-osmolality and hyponatremia: Secondary | ICD-10-CM | POA: Diagnosis not present

## 2024-02-15 DIAGNOSIS — E876 Hypokalemia: Secondary | ICD-10-CM | POA: Diagnosis present

## 2024-02-15 DIAGNOSIS — E785 Hyperlipidemia, unspecified: Secondary | ICD-10-CM | POA: Diagnosis present

## 2024-02-15 DIAGNOSIS — K5731 Diverticulosis of large intestine without perforation or abscess with bleeding: Secondary | ICD-10-CM | POA: Diagnosis present

## 2024-02-15 DIAGNOSIS — K51511 Left sided colitis with rectal bleeding: Secondary | ICD-10-CM | POA: Diagnosis present

## 2024-02-15 DIAGNOSIS — K5900 Constipation, unspecified: Secondary | ICD-10-CM | POA: Diagnosis present

## 2024-02-15 DIAGNOSIS — R112 Nausea with vomiting, unspecified: Secondary | ICD-10-CM | POA: Diagnosis present

## 2024-02-15 DIAGNOSIS — R61 Generalized hyperhidrosis: Secondary | ICD-10-CM | POA: Diagnosis present

## 2024-02-15 DIAGNOSIS — B001 Herpesviral vesicular dermatitis: Secondary | ICD-10-CM | POA: Diagnosis present

## 2024-02-15 DIAGNOSIS — R7303 Prediabetes: Secondary | ICD-10-CM | POA: Diagnosis present

## 2024-02-15 DIAGNOSIS — I10 Essential (primary) hypertension: Secondary | ICD-10-CM | POA: Diagnosis present

## 2024-02-15 DIAGNOSIS — Z811 Family history of alcohol abuse and dependence: Secondary | ICD-10-CM | POA: Diagnosis not present

## 2024-02-15 DIAGNOSIS — E860 Dehydration: Secondary | ICD-10-CM | POA: Diagnosis present

## 2024-02-15 DIAGNOSIS — D638 Anemia in other chronic diseases classified elsewhere: Secondary | ICD-10-CM | POA: Diagnosis present

## 2024-02-15 DIAGNOSIS — R739 Hyperglycemia, unspecified: Secondary | ICD-10-CM | POA: Diagnosis present

## 2024-02-15 DIAGNOSIS — Z8249 Family history of ischemic heart disease and other diseases of the circulatory system: Secondary | ICD-10-CM | POA: Diagnosis not present

## 2024-02-15 DIAGNOSIS — D72829 Elevated white blood cell count, unspecified: Secondary | ICD-10-CM | POA: Diagnosis present

## 2024-02-15 DIAGNOSIS — K5190 Ulcerative colitis, unspecified, without complications: Secondary | ICD-10-CM | POA: Diagnosis present

## 2024-02-15 DIAGNOSIS — Z6830 Body mass index (BMI) 30.0-30.9, adult: Secondary | ICD-10-CM | POA: Diagnosis not present

## 2024-02-15 DIAGNOSIS — T380X5A Adverse effect of glucocorticoids and synthetic analogues, initial encounter: Secondary | ICD-10-CM | POA: Diagnosis present

## 2024-02-15 DIAGNOSIS — E66811 Obesity, class 1: Secondary | ICD-10-CM | POA: Diagnosis present

## 2024-02-15 DIAGNOSIS — K769 Liver disease, unspecified: Secondary | ICD-10-CM | POA: Diagnosis present

## 2024-02-15 DIAGNOSIS — Z818 Family history of other mental and behavioral disorders: Secondary | ICD-10-CM | POA: Diagnosis not present

## 2024-02-15 LAB — BASIC METABOLIC PANEL WITH GFR
Anion gap: 9 (ref 5–15)
BUN: 9 mg/dL (ref 8–23)
CO2: 25 mmol/L (ref 22–32)
Calcium: 8.2 mg/dL — ABNORMAL LOW (ref 8.9–10.3)
Chloride: 101 mmol/L (ref 98–111)
Creatinine, Ser: 0.55 mg/dL (ref 0.44–1.00)
GFR, Estimated: >60 mL/min (ref >60)
Glucose, Bld: 165 mg/dL — ABNORMAL HIGH (ref 70–99)
Potassium: 4.2 mmol/L (ref 3.5–5.1)
Sodium: 135 mmol/L (ref 135–145)

## 2024-02-15 LAB — CBC
HCT: 31 % — ABNORMAL LOW (ref 36.0–46.0)
Hemoglobin: 9.8 g/dL — ABNORMAL LOW (ref 12.0–15.0)
MCH: 28.7 pg (ref 26.0–34.0)
MCHC: 31.6 g/dL (ref 30.0–36.0)
MCV: 90.6 fL (ref 80.0–100.0)
Platelets: 488 10*3/uL — ABNORMAL HIGH (ref 150–400)
RBC: 3.42 MIL/uL — ABNORMAL LOW (ref 3.87–5.11)
RDW: 14.5 % (ref 11.5–15.5)
WBC: 13.3 10*3/uL — ABNORMAL HIGH (ref 4.0–10.5)
nRBC: 0 % (ref 0.0–0.2)

## 2024-02-15 MED ORDER — MESALAMINE 1000 MG RE SUPP
1000.0000 mg | Freq: Every day | RECTAL | Status: DC
  Administered 2024-02-15 – 2024-02-19 (×5): 1000 mg via RECTAL
  Filled 2024-02-15 (×5): qty 1

## 2024-02-15 MED ORDER — CHLORHEXIDINE GLUCONATE CLOTH 2 % EX PADS
6.0000 | MEDICATED_PAD | Freq: Every day | CUTANEOUS | Status: DC
  Administered 2024-02-15 – 2024-02-20 (×6): 6 via TOPICAL

## 2024-02-15 MED ORDER — LACTATED RINGERS IV SOLN: INTRAVENOUS | Status: DC

## 2024-02-15 MED ORDER — METHYLPREDNISOLONE SODIUM SUCC 125 MG IJ SOLR
60.0000 mg | Freq: Once | INTRAMUSCULAR | Status: AC
  Administered 2024-02-15: 60 mg via INTRAVENOUS
  Filled 2024-02-15: qty 2

## 2024-02-15 MED ORDER — SODIUM CHLORIDE 0.9% FLUSH
10.0000 mL | Freq: Two times a day (BID) | INTRAVENOUS | Status: DC
Start: 2024-02-15 — End: 2024-02-20
  Administered 2024-02-16 – 2024-02-20 (×8): 10 mL

## 2024-02-15 MED ORDER — METHYLPREDNISOLONE SODIUM SUCC 40 MG IJ SOLR
40.0000 mg | Freq: Two times a day (BID) | INTRAMUSCULAR | Status: DC
  Administered 2024-02-15 – 2024-02-20 (×10): 40 mg via INTRAVENOUS
  Filled 2024-02-15 (×10): qty 1

## 2024-02-15 MED ORDER — MESALAMINE 1.2 G PO TBEC
4.8000 g | DELAYED_RELEASE_TABLET | Freq: Every day | ORAL | Status: DC
  Administered 2024-02-16 – 2024-02-20 (×5): 4.8 g via ORAL
  Filled 2024-02-15 (×5): qty 4

## 2024-02-15 NOTE — Progress Notes (Signed)
 Nurse tech reported to RN that patient had a bloody bowel movement, RN reached out to MD and made aware, patient is asymptomatic at this time.

## 2024-02-15 NOTE — Progress Notes (Signed)

## 2024-02-15 NOTE — Progress Notes (Signed)
 Subjective: Feeling better with the pain medication.  Objective: Vital signs in last 24 hours: Temp:  [98 F (36.7 C)-100.2 F (37.9 C)] 100.2 F (37.9 C) (03/27 1401) Pulse Rate:  [77-92] 83 (03/27 1401) Resp:  [16-18] 17 (03/27 1401) BP: (119-155)/(69-89) 142/78 (03/27 1401) SpO2:  [94 %-99 %] 95 % (03/27 1401) Weight:  [84 kg] 84 kg (03/26 1932) Last BM Date : 02/15/24  Intake/Output from previous day: 03/26 0701 - 03/27 0700 In: 1527.8 [P.O.:240; I.V.:287.8; IV Piggyback:1000] Out: 600 [Urine:600] Intake/Output this shift: Total I/O In: 571.3 [P.O.:360; I.V.:211.3] Out: 400 [Urine:400]  General appearance: alert and no distress Resp: clear to auscultation bilaterally Cardio: regular rate and rhythm GI: soft, non-tender; bowel sounds normal; no masses,  no organomegaly Extremities: extremities normal, atraumatic, no cyanosis or edema  Lab Results: Recent Labs    02/14/24 2020 02/15/24 0500  WBC 13.1* 13.3*  HGB 10.3* 9.8*  HCT 32.3* 31.0*  PLT 522* 488*   BMET Recent Labs    02/14/24 2020 02/15/24 0500  NA 135 135  K 3.2* 4.2  CL 97* 101  CO2 27 25  GLUCOSE 120* 165*  BUN 12 9  CREATININE 0.61 0.55  CALCIUM 8.4* 8.2*   LFT Recent Labs    02/14/24 2020  PROT 6.4*  ALBUMIN 2.6*  AST 16  ALT 26  ALKPHOS 77  BILITOT 0.9   PT/INR No results for input(s): "LABPROT", "INR" in the last 72 hours. Hepatitis Panel No results for input(s): "HEPBSAG", "HCVAB", "HEPAIGM", "HEPBIGM" in the last 72 hours. C-Diff No results for input(s): "CDIFFTOX" in the last 72 hours. Fecal Lactopherrin No results for input(s): "FECLLACTOFRN" in the last 72 hours.  Studies/Results: CT ABDOMEN PELVIS W CONTRAST Result Date: 02/14/2024 CLINICAL DATA:  Lower abdominal pain with nausea. Recent diagnosis of ulcerative colitis. EXAM: CT ABDOMEN AND PELVIS WITH CONTRAST TECHNIQUE: Multidetector CT imaging of the abdomen and pelvis was performed using the standard protocol  following bolus administration of intravenous contrast. RADIATION DOSE REDUCTION: This exam was performed according to the departmental dose-optimization program which includes automated exposure control, adjustment of the mA and/or kV according to patient size and/or use of iterative reconstruction technique. CONTRAST:  OMNIPAQUE IOHEXOL 300 MG/ML  SOLN COMPARISON:  02/01/2024. FINDINGS: Lower chest: Atelectasis or scarring is noted at the lung bases, greater on the left than on the right. Hepatobiliary: There is a complex irregular hypodensity in the right lobe of the liver measuring 2.8 x 2.5 cm. Fatty infiltration of the liver is noted. The gallbladder is without stones. No biliary ductal dilatation is seen. Pancreas: Unremarkable. No pancreatic ductal dilatation or surrounding inflammatory changes. Spleen: Normal in size without focal abnormality. Adrenals/Urinary Tract: The adrenal glands are within normal limits. The kidneys enhance symmetrically. No renal calculus or hydronephrosis bilaterally. Subcentimeter hypodensities are present in the left kidney which are too small to further characterize. The bladder is unremarkable. Stomach/Bowel: The stomach is within normal limits. No bowel obstruction, free air, or pneumatosis is seen. There is colonic wall thickening with surrounding fat stranding involving the descending and sigmoid colon and rectum. Scattered diverticula are noted along the colon without evidence of diverticulitis. There is a large quantity of stool in the ascending and transverse colon. The appendix is not seen. Vascular/Lymphatic: Aortic atherosclerosis. Prominent lymph nodes are present in the retroperitoneum and mesentery, which may be reactive. Reproductive: Uterus and bilateral adnexa are unremarkable. Other: Small amount of free fluid is noted in the right pericolic gutter. There is  a fat containing umbilical hernia. Musculoskeletal: Bilateral breast implants are noted  degenerative changes are present in the thoracolumbar spine. No acute osseous abnormality. IMPRESSION: 1. Colonic wall thickening with surrounding fat stranding involving the descending colon, sigmoid colon, and rectum, suggesting infectious or inflammatory colitis and may be associated with patient's history of ulcerative colitis. Findings are not significantly changed from the previous exam. 2. Diverticulosis without diverticulitis. 3. Indeterminate complex hypodensity in the right lobe of the liver. Nonemergent MRI with contrast is recommended for further characterization. 4. Hepatic steatosis. 5. Aortic atherosclerosis. Electronically Signed   By: Thornell Sartorius M.D.   On: 02/14/2024 22:02    Medications: Scheduled:  atorvastatin  40 mg Oral QHS   enoxaparin (LOVENOX) injection  40 mg Subcutaneous Q24H   losartan  50 mg Oral Daily   mesalamine  1,000 mg Rectal QHS   [START ON 02/16/2024] mesalamine  4.8 g Oral Q breakfast   methylPREDNISolone (SOLU-MEDROL) injection  40 mg Intravenous Q12H   Continuous:  lactated ringers 100 mL/hr at 02/15/24 1237    Assessment/Plan: 1) Severe left-sided UC. 2) Constipation.   I am very familiar with the patient.  She was in the office this past Monday and she was advised to think about going back to the hospital.  Her Quantiferon in the hospital was indeterminant and a second Quantiferon was obtained this Monday.  The results are still pending.  It is likely that she will have an indeterminant result again, which is not unexpected in UC presentation such as hers.  She does not have any risk factors for TB.  The plan is to provide her with an infusion of infliximab 5 mg/kg here in the hospital.  She originally had a mild response with Solu-Medrol, but the plateaued with prednisone 40 mg PO.  She was extensively counseled about the potential for reactivation TB and the possibility of death if this occurred after the infliximab infusion.  The patient and her  sister understand the risks and the risks of not obtaining further treatment.  Barring any positive result with the repeat Quantiferon, infliximab will be pursued.  If there is no benefit with infliximab, surgical consultation will be required.  Plan: 1) Await outpatient result for Quantiferon.  I am on call this weekend to monitor the result. 2) If negative or indeterminant, the patient desires to proceed with infliximab. 3) Solu-Medrol 40 mg IV q12 hours. 4) Add Lialda 4.8 g every day and Canasa at bedtime as a bridge for treatment, for now.  LOS: 0 days   Nancy Eaton D 02/15/2024, 4:22 PM

## 2024-02-15 NOTE — TOC Initial Note (Signed)
 Transition of Care [facility]) - Initial/Assessment Note    Patient Details  Name: Nancy Eaton MRN: [id number] Date of Birth: [birth year]  [facility] CM/SW Contact:    Howell Rucks, RN Phone Number: 02/15/2024, 10:08 AM  Clinical Narrative:   Met with pt at bedside to introduce role of TOC/NCM and review for dc planning, pt reports she has an established PCP and pharmacy, no current home care services, reports she has a BSC, reports she resides with her son and feels safe returning home,confirmed transportation is available at discharge. MOON completed. TOC will continue to follow.                 Expected Discharge Plan: Home/Self Care Barriers to Discharge: Continued Medical Work up   Patient Goals and CMS Choice Patient states their goals for this hospitalization and ongoing recovery are:: return home          Expected Discharge Plan and Services       Living arrangements for the past 2 months: Single Family Home                                      Prior Living Arrangements/Services Living arrangements for the past 2 months: Single Family Home Lives with:: Adult Children Patient language and need for interpreter reviewed:: Yes Do you feel safe going back to the place where you live?: Yes      Need for Family Participation in Patient Care: Yes (Comment) Care giver support system in place?: Yes (comment) Current home services: DME (BSC) Criminal Activity/Legal Involvement Pertinent to Current Situation/Hospitalization: No - Comment as needed  Activities of Daily Living   ADL Screening (condition at time of admission) Independently performs ADLs?: Yes (appropriate for developmental age) Is the patient deaf or have difficulty hearing?: No Does the patient have difficulty seeing, even when wearing glasses/contacts?: No Does the patient have difficulty concentrating, remembering, or making decisions?: No  Permission Sought/Granted                   Emotional Assessment Appearance:: Appears stated age Attitude/Demeanor/Rapport: Gracious Affect (typically observed): Accepting Orientation: : Oriented to Self, Oriented to Place, Oriented to  Time, Oriented to Situation Alcohol / Substance Use: Not Applicable Psych Involvement: No (comment)  Admission diagnosis:  Ulcerative colitis, left sided, with rectal bleeding (HCC) [K51.511] Ulcerative colitis with complication, unspecified location [facility]) [K51.919] Patient Active Problem List   Diagnosis Date Noted   Ulcerative colitis, left sided, with rectal bleeding (HCC) 02/14/2024   Hepatic lesion 02/14/2024   Hypokalemia 02/14/2024   Constipation 02/01/2024   Arthrofibrosis of total knee replacement (HCC) 08/26/2020   Status post total right knee replacement 07/17/2020   Unilateral primary osteoarthritis, right knee 12/30/2019   Acute right-sided low back pain with right-sided sciatica 12/25/2017   Health maintenance examination 09/30/2013   Hyperlipidemia 09/30/2013   PCP:  Drosinis, Leonia Reader, PA-C Pharmacy:   [facility] [address], [place] - [address] [place] [address] Phone: [phone] Fax: [phone]  [id number] [place], [place] - [address] AT [facility] OF [address] & [address] [place] [address] Phone: [phone] Fax: [phone]     Social Drivers of Health (SDOH) Social History: SDOH Screenings   Food Insecurity: No Food Insecurity (02/14/2024)  Housing: Low Risk  (  02/14/2024)  Transportation Needs: No Transportation Needs (02/14/2024)  Utilities: Not At Risk (02/14/2024)  Social Connections: Unknown (02/14/2024)  Tobacco Use: Low Risk  (02/14/2024)   SDOH Interventions:     Readmission Risk Interventions     No data to display

## 2024-02-15 NOTE — Progress Notes (Signed)
 PROGRESS NOTE  Nancy Eaton  JYN:[id number] DOB: [birth year] DOA: 02/14/2024 PCP: Brayton El, PA-C  Consultants  Brief Narrative: 68 y.o. female with medical history significant for recently diagnosed left-sided ulcerative colitis, HTN, HLD who presented to the ED for evaluation of abdominal pain.  Patient was recently admitted 3/13-3/17 for severe constipation and colitis.  She underwent flexible sigmoidoscopy on 3/14 which was consistent with severe left-sided ulcerative colitis.  Biopsy also consistent with ulcerative colitis.  C. difficile was negative.  GI pathogen panel was positive for rotavirus.  She was treated with IV Solu-Medrol 40 mg q12h, IV Levaquin and Flagyl while in the hospital.  She was discharged on 5-week prednisone taper.   Patient states that she has not really felt much better since leaving the hospital.  She has continued cramping abdominal pain across her lower abdomen.  She has had significant nausea and dry heaves limiting her oral intake.  Came to ED for same, repeat CT essentially unchanged from prior exam.  Admitted to hospitalist service.  Assessment & Plan: Ulcerative colitis: New diagnosis on recent admission, returns with persistent symptoms despite prednisone taper.  CT shows similar left-sided colitis findings.  She is dehydrated from not being able to maintain adequate oral intake. -GI consulted appreciate input. -Status post 60 mg IV Solu-Medrol in ER yesterday.  Repeat today as we await GI input. -She is on liquid diet and trying to remain hydrated but occasionally feels nauseous.  Therefore we will continue IV fluid hydration -Continue IV antiemetics and analgesics as needed.     Rectal bleeding with mild anemia: In setting of ulcerative colitis. -Stable since admission.   Complex right hepatic lesion: Again noted on CT imaging.  Further workup with MRI on outpatient basis recommended.   Leukocytosis: -Longstanding, present since 2021.   Downtrending since last admission.  Will monitor.   Hypokalemia: Resolved   Hypertension: Continue losartan.   Hyperlipidemia: Continue atorvastatin.         DVT prophylaxis:  enoxaparin (LOVENOX) injection 40 mg Start: 02/15/24 2200  Code Status:   Code Status: Full Code Level of care: Med-Surg Status is: Inpatient   Consults called: Gastroenterology  Subjective: Patient feels "okay" today.  Not really well.  Has had 4-5 bowel movements all bloody this morning.  Is somewhat tolerating liquid diet.  Objective: Vitals:   02/14/24 2343 02/15/24 0555 02/15/24 1028 02/15/24 1401  BP: (!) 155/89 119/69 126/70 (!) 142/78  Pulse: 82 77 92 83  Resp: 18 18 18 17   Temp: 98.2 F (36.8 C) 98.4 F (36.9 C) 98.4 F (36.9 C) 100.2 F (37.9 C)  TempSrc: Oral Oral Oral Oral  SpO2: 98% 94% 96% 95%  Weight:      Height:        Intake/Output Summary (Last 24 hours) at 02/15/2024 1403 Last data filed at 02/15/2024 1032 Gross per 24 hour  Intake 1647.84 ml  Output 1000 ml  Net 647.84 ml   Filed Weights   02/14/24 1932  Weight: 84 kg   Body mass index is 32.8 kg/m.  Gen: 68 y.o. female in no apparent distress.  Nontoxic Pulm: Non-labored breathing.  Clear to auscultation bilaterally.  CV: Regular rate and rhythm.  GI: Abdomen soft, nondistended.  Tender palpation bilateral lower quadrants.  No guarding or rigidity.  Bowel sounds throughout. Ext: Warm, no deformities, no pedal edema Skin: No rashes, lesions no ulcers Neuro: Alert and oriented. No focal neurological deficits. Psych: Calm  Judgement and insight appear normal.  Mood & affect appropriate.     I have personally reviewed the following labs and images: CBC: Recent Labs  Lab 02/14/24 2020 02/15/24 0500  WBC 13.1* 13.3*  NEUTROABS 9.8*  --   HGB 10.3* 9.8*  HCT 32.3* 31.0*  MCV 89.5 90.6  PLT 522* 488*   BMP &GFR Recent Labs  Lab 02/14/24 2020 02/15/24 0500  NA 135 135  K 3.2* 4.2  CL 97* 101  CO2  27 25  GLUCOSE 120* 165*  BUN 12 9  CREATININE 0.61 0.55  CALCIUM 8.4* 8.2*   Estimated Creatinine Clearance: 68.1 mL/min (by C-G formula based on SCr of 0.55 mg/dL). Liver & Pancreas: Recent Labs  Lab 02/14/24 2020  AST 16  ALT 26  ALKPHOS 77  BILITOT 0.9  PROT 6.4*  ALBUMIN 2.6*   Recent Labs  Lab 02/14/24 2020  LIPASE 19   No results for input(s): "AMMONIA" in the last 168 hours. Diabetic: No results for input(s): "HGBA1C" in the last 72 hours. No results for input(s): "GLUCAP" in the last 168 hours. Cardiac Enzymes: No results for input(s): "CKTOTAL", "CKMB", "CKMBINDEX", "TROPONINI" in the last 168 hours. No results for input(s): "PROBNP" in the last 8760 hours. Coagulation Profile: No results for input(s): "INR", "PROTIME" in the last 168 hours. Thyroid Function Tests: No results for input(s): "TSH", "T4TOTAL", "FREET4", "T3FREE", "THYROIDAB" in the last 72 hours. Lipid Profile: No results for input(s): "CHOL", "HDL", "LDLCALC", "TRIG", "CHOLHDL", "LDLDIRECT" in the last 72 hours. Anemia Panel: No results for input(s): "VITAMINB12", "FOLATE", "FERRITIN", "TIBC", "IRON", "RETICCTPCT" in the last 72 hours. Urine analysis:    Component Value Date/Time   COLORURINE YELLOW 02/14/2024 2047   APPEARANCEUR HAZY (A) 02/14/2024 2047   LABSPEC 1.026 02/14/2024 2047   PHURINE 5.0 02/14/2024 2047   GLUCOSEU NEGATIVE 02/14/2024 2047   HGBUR NEGATIVE 02/14/2024 2047   BILIRUBINUR SMALL (A) 02/14/2024 2047   KETONESUR NEGATIVE 02/14/2024 2047   PROTEINUR 30 (A) 02/14/2024 2047   NITRITE NEGATIVE 02/14/2024 2047   LEUKOCYTESUR NEGATIVE 02/14/2024 2047   Sepsis Labs: Invalid input(s): "PROCALCITONIN", "LACTICIDVEN"  Microbiology: No results found for this or any previous visit (from the past 240 hours).  Radiology Studies: CT ABDOMEN PELVIS W CONTRAST Result Date: 02/14/2024 CLINICAL DATA:  Lower abdominal pain with nausea. Recent diagnosis of ulcerative colitis.  EXAM: CT ABDOMEN AND PELVIS WITH CONTRAST TECHNIQUE: Multidetector CT imaging of the abdomen and pelvis was performed using the standard protocol following bolus administration of intravenous contrast. RADIATION DOSE REDUCTION: This exam was performed according to the departmental dose-optimization program which includes automated exposure control, adjustment of the mA and/or kV according to patient size and/or use of iterative reconstruction technique. CONTRAST:  OMNIPAQUE IOHEXOL 300 MG/ML  SOLN COMPARISON:  02/01/2024. FINDINGS: Lower chest: Atelectasis or scarring is noted at the lung bases, greater on the left than on the right. Hepatobiliary: There is a complex irregular hypodensity in the right lobe of the liver measuring 2.8 x 2.5 cm. Fatty infiltration of the liver is noted. The gallbladder is without stones. No biliary ductal dilatation is seen. Pancreas: Unremarkable. No pancreatic ductal dilatation or surrounding inflammatory changes. Spleen: Normal in size without focal abnormality. Adrenals/Urinary Tract: The adrenal glands are within normal limits. The kidneys enhance symmetrically. No renal calculus or hydronephrosis bilaterally. Subcentimeter hypodensities are present in the left kidney which are too small to further characterize. The bladder is unremarkable. Stomach/Bowel: The stomach is within normal limits. No bowel obstruction, free air, or pneumatosis is  seen. There is colonic wall thickening with surrounding fat stranding involving the descending and sigmoid colon and rectum. Scattered diverticula are noted along the colon without evidence of diverticulitis. There is a large quantity of stool in the ascending and transverse colon. The appendix is not seen. Vascular/Lymphatic: Aortic atherosclerosis. Prominent lymph nodes are present in the retroperitoneum and mesentery, which may be reactive. Reproductive: Uterus and bilateral adnexa are unremarkable. Other: Small amount of free fluid is  noted in the right pericolic gutter. There is a fat containing umbilical hernia. Musculoskeletal: Bilateral breast implants are noted degenerative changes are present in the thoracolumbar spine. No acute osseous abnormality. IMPRESSION: 1. Colonic wall thickening with surrounding fat stranding involving the descending colon, sigmoid colon, and rectum, suggesting infectious or inflammatory colitis and may be associated with patient's history of ulcerative colitis. Findings are not significantly changed from the previous exam. 2. Diverticulosis without diverticulitis. 3. Indeterminate complex hypodensity in the right lobe of the liver. Nonemergent MRI with contrast is recommended for further characterization. 4. Hepatic steatosis. 5. Aortic atherosclerosis. Electronically Signed   By: Thornell Sartorius M.D.   On: 02/14/2024 22:02    Scheduled Meds:  atorvastatin  40 mg Oral QHS   enoxaparin (LOVENOX) injection  40 mg Subcutaneous Q24H   losartan  50 mg Oral Daily   Continuous Infusions:  lactated ringers 100 mL/hr at 02/15/24 1237     LOS: 0 days   35 minutes with more than 50% spent in reviewing records, counseling patient/family and coordinating care.  Tobey Grim, MD [facility] [email or web address] 02/15/2024, 2:03 PM

## 2024-02-15 NOTE — Plan of Care (Signed)
   Problem: Clinical Measurements: Goal: Diagnostic test results will improve Outcome: Progressing   Problem: Activity: Goal: Risk for activity intolerance will decrease Outcome: Progressing   Problem: Nutrition: Goal: Adequate nutrition will be maintained Outcome: Progressing

## 2024-02-15 NOTE — Care Management Obs Status (Signed)
 MEDICARE OBSERVATION STATUS NOTIFICATION   Patient Details  Name: Nancy Eaton MRN: [id number] Date of Birth: 1956-11-13   Medicare Observation Status Notification Given:  Yes    Howell Rucks, RN 02/15/2024, 10:02 AM

## 2024-02-15 NOTE — Progress Notes (Signed)
 Mobility Specialist - Progress Note   02/15/24 1016  Mobility  Activity Ambulated independently in hallway  Level of Assistance Independent  Assistive Device None  Distance Ambulated (ft) 500 ft  Activity Response Tolerated well  Mobility Referral Yes  Mobility visit 1 Mobility  Mobility Specialist Start Time (ACUTE ONLY) 1008  Mobility Specialist Stop Time (ACUTE ONLY) 1014  Mobility Specialist Time Calculation (min) (ACUTE ONLY) 6 min   Pt received in bed and agreeable to mobility. No complaints during session. Pt to bathroom after session with all needs met.   [facility]

## 2024-02-16 DIAGNOSIS — K51511 Left sided colitis with rectal bleeding: Secondary | ICD-10-CM | POA: Diagnosis not present

## 2024-02-16 LAB — BASIC METABOLIC PANEL WITH GFR
Anion gap: 9 (ref 5–15)
BUN: 9 mg/dL (ref 8–23)
CO2: 26 mmol/L (ref 22–32)
Calcium: 8.3 mg/dL — ABNORMAL LOW (ref 8.9–10.3)
Chloride: 100 mmol/L (ref 98–111)
Creatinine, Ser: 0.59 mg/dL (ref 0.44–1.00)
GFR, Estimated: >60 mL/min (ref >60)
Glucose, Bld: 208 mg/dL — ABNORMAL HIGH (ref 70–99)
Potassium: 3.9 mmol/L (ref 3.5–5.1)
Sodium: 135 mmol/L (ref 135–145)

## 2024-02-16 LAB — CBC
HCT: 29.6 % — ABNORMAL LOW (ref 36.0–46.0)
Hemoglobin: 9.3 g/dL — ABNORMAL LOW (ref 12.0–15.0)
MCH: 28.3 pg (ref 26.0–34.0)
MCHC: 31.4 g/dL (ref 30.0–36.0)
MCV: 90 fL (ref 80.0–100.0)
Platelets: 484 10*3/uL — ABNORMAL HIGH (ref 150–400)
RBC: 3.29 MIL/uL — ABNORMAL LOW (ref 3.87–5.11)
RDW: 14.2 % (ref 11.5–15.5)
WBC: 12.5 10*3/uL — ABNORMAL HIGH (ref 4.0–10.5)
nRBC: 0 % (ref 0.0–0.2)

## 2024-02-16 LAB — GLUCOSE, CAPILLARY
Glucose-Capillary: 236 mg/dL — ABNORMAL HIGH (ref 70–99)
Glucose-Capillary: 194 mg/dL — ABNORMAL HIGH (ref 70–99)
Glucose-Capillary: 155 mg/dL — ABNORMAL HIGH (ref 70–99)

## 2024-02-16 LAB — HEMOGLOBIN A1C
Hgb A1c MFr Bld: 6.4 % — ABNORMAL HIGH (ref 4.8–5.6)
Mean Plasma Glucose: 136.98 mg/dL

## 2024-02-16 MED ORDER — SIMETHICONE 80 MG PO CHEW
80.0000 mg | CHEWABLE_TABLET | Freq: Four times a day (QID) | ORAL | Status: DC | PRN
  Administered 2024-02-16: 80 mg via ORAL
  Filled 2024-02-16 (×2): qty 1

## 2024-02-16 MED ORDER — INSULIN ASPART 100 UNIT/ML IJ SOLN
0.0000 [IU] | Freq: Three times a day (TID) | INTRAMUSCULAR | Status: DC
  Administered 2024-02-16: 5 [IU] via SUBCUTANEOUS
  Administered 2024-02-16 – 2024-02-17 (×2): 3 [IU] via SUBCUTANEOUS
  Administered 2024-02-17 (×2): 2 [IU] via SUBCUTANEOUS
  Administered 2024-02-18: 5 [IU] via SUBCUTANEOUS
  Administered 2024-02-18 (×2): 2 [IU] via SUBCUTANEOUS
  Administered 2024-02-19: 3 [IU] via SUBCUTANEOUS
  Administered 2024-02-19: 2 [IU] via SUBCUTANEOUS
  Administered 2024-02-19: 5 [IU] via SUBCUTANEOUS
  Administered 2024-02-20: 3 [IU] via SUBCUTANEOUS

## 2024-02-16 NOTE — Progress Notes (Signed)
 Subjective: No new complaints.  She did have some bowel movements last evening.  The evenings seem to be the worst for her.  Objective: Vital signs in last 24 hours: Temp:  [97.9 F (36.6 C)-100.2 F (37.9 C)] 98 F (36.7 C) (03/28 1331) Pulse Rate:  [82-96] 82 (03/28 1331) Resp:  [16-18] 18 (03/28 1331) BP: (135-147)/(76-83) 135/77 (03/28 1331) SpO2:  [94 %-95 %] 95 % (03/28 1331) Last BM Date : 02/15/24  Intake/Output from previous day: 03/27 0701 - 03/28 0700 In: 2268.8 [P.O.:960; I.V.:1308.8] Out: 1600 [Urine:1600] Intake/Output this shift: Total I/O In: 360 [P.O.:360] Out: 200 [Stool:200]  General appearance: alert and no distress GI: soft, non-tender; bowel sounds normal; no masses,  no organomegaly  Lab Results: Recent Labs    02/14/24 2020 02/15/24 0500 02/16/24 0405  WBC 13.1* 13.3* 12.5*  HGB 10.3* 9.8* 9.3*  HCT 32.3* 31.0* 29.6*  PLT 522* 488* 484*   BMET Recent Labs    02/14/24 2020 02/15/24 0500 02/16/24 0405  NA 135 135 135  K 3.2* 4.2 3.9  CL 97* 101 100  CO2 27 25 26   GLUCOSE 120* 165* 208*  BUN 12 9 9   CREATININE 0.61 0.55 0.59  CALCIUM 8.4* 8.2* 8.3*   LFT Recent Labs    02/14/24 2020  PROT 6.4*  ALBUMIN 2.6*  AST 16  ALT 26  ALKPHOS 77  BILITOT 0.9   PT/INR No results for input(s): "LABPROT", "INR" in the last 72 hours. Hepatitis Panel No results for input(s): "HEPBSAG", "HCVAB", "HEPAIGM", "HEPBIGM" in the last 72 hours. C-Diff No results for input(s): "CDIFFTOX" in the last 72 hours. Fecal Lactopherrin No results for input(s): "FECLLACTOFRN" in the last 72 hours.  Studies/Results: CT ABDOMEN PELVIS W CONTRAST Result Date: 02/14/2024 CLINICAL DATA:  Lower abdominal pain with nausea. Recent diagnosis of ulcerative colitis. EXAM: CT ABDOMEN AND PELVIS WITH CONTRAST TECHNIQUE: Multidetector CT imaging of the abdomen and pelvis was performed using the standard protocol following bolus administration of intravenous contrast.  RADIATION DOSE REDUCTION: This exam was performed according to the departmental dose-optimization program which includes automated exposure control, adjustment of the mA and/or kV according to patient size and/or use of iterative reconstruction technique. CONTRAST:  OMNIPAQUE IOHEXOL 300 MG/ML  SOLN COMPARISON:  02/01/2024. FINDINGS: Lower chest: Atelectasis or scarring is noted at the lung bases, greater on the left than on the right. Hepatobiliary: There is a complex irregular hypodensity in the right lobe of the liver measuring 2.8 x 2.5 cm. Fatty infiltration of the liver is noted. The gallbladder is without stones. No biliary ductal dilatation is seen. Pancreas: Unremarkable. No pancreatic ductal dilatation or surrounding inflammatory changes. Spleen: Normal in size without focal abnormality. Adrenals/Urinary Tract: The adrenal glands are within normal limits. The kidneys enhance symmetrically. No renal calculus or hydronephrosis bilaterally. Subcentimeter hypodensities are present in the left kidney which are too small to further characterize. The bladder is unremarkable. Stomach/Bowel: The stomach is within normal limits. No bowel obstruction, free air, or pneumatosis is seen. There is colonic wall thickening with surrounding fat stranding involving the descending and sigmoid colon and rectum. Scattered diverticula are noted along the colon without evidence of diverticulitis. There is a large quantity of stool in the ascending and transverse colon. The appendix is not seen. Vascular/Lymphatic: Aortic atherosclerosis. Prominent lymph nodes are present in the retroperitoneum and mesentery, which may be reactive. Reproductive: Uterus and bilateral adnexa are unremarkable. Other: Small amount of free fluid is noted in the right pericolic  gutter. There is a fat containing umbilical hernia. Musculoskeletal: Bilateral breast implants are noted degenerative changes are present in the thoracolumbar spine. No  acute osseous abnormality. IMPRESSION: 1. Colonic wall thickening with surrounding fat stranding involving the descending colon, sigmoid colon, and rectum, suggesting infectious or inflammatory colitis and may be associated with patient's history of ulcerative colitis. Findings are not significantly changed from the previous exam. 2. Diverticulosis without diverticulitis. 3. Indeterminate complex hypodensity in the right lobe of the liver. Nonemergent MRI with contrast is recommended for further characterization. 4. Hepatic steatosis. 5. Aortic atherosclerosis. Electronically Signed   By: Thornell Sartorius M.D.   On: 02/14/2024 22:02    Medications: Scheduled:  atorvastatin  40 mg Oral QHS   Chlorhexidine Gluconate Cloth  6 each Topical Daily   enoxaparin (LOVENOX) injection  40 mg Subcutaneous Q24H   insulin aspart  0-15 Units Subcutaneous TID WC   losartan  50 mg Oral Daily   mesalamine  1,000 mg Rectal QHS   mesalamine  4.8 g Oral Q breakfast   methylPREDNISolone (SOLU-MEDROL) injection  40 mg Intravenous Q12H   sodium chloride flush  10-40 mL Intracatheter Q12H   Continuous:  Assessment/Plan: 1) Severe left-sided UC. 2) Anemia.   She is stable.  There may be some signs of improvement, but it is not significant.  [facility] states that her repeat Quantiferon, performed as an outpatient, will not be resulted until Monday.  Plan: 1) Continue with Solumedrol. 2) Continue with oral and rectal mesalamine. 3) Await quantiferon result.  LOS: 1 day   Denaja Verhoeven D 02/16/2024, 1:50 PM

## 2024-02-16 NOTE — Plan of Care (Signed)
  Problem: Education: Goal: Knowledge of General Education information will improve Description: Including pain rating scale, medication(s)/side effects and non-pharmacologic comfort measures Outcome: Progressing   Problem: Clinical Measurements: Goal: Will remain free from infection Outcome: Progressing Goal: Diagnostic test results will improve Outcome: Progressing   Problem: Activity: Goal: Risk for activity intolerance will decrease Outcome: Progressing   Problem: Elimination: Goal: Will not experience complications related to bowel motility Outcome: Not Progressing

## 2024-02-16 NOTE — Progress Notes (Signed)
 PROGRESS NOTE  Nancy Eaton  ZOX:[id number] DOB: 19-Jan-1956 DOA: 02/14/2024 PCP: Brayton El, PA-C  Consultants  Brief Narrative: 68 y.o. female with medical history significant for recently diagnosed left-sided ulcerative colitis, HTN, HLD who presented to the ED for evaluation of abdominal pain.  Patient was recently admitted 3/13-3/17 for severe constipation and colitis.  She underwent flexible sigmoidoscopy on 3/14 which was consistent with severe left-sided ulcerative colitis.  Biopsy also consistent with ulcerative colitis.  C. difficile was negative.  GI pathogen panel was positive for rotavirus.  She was treated with IV Solu-Medrol 40 mg q12h, IV Levaquin and Flagyl while in the hospital.  She was discharged on 5-week prednisone taper.   Patient states that she has not really felt much better since leaving the hospital.  She has continued cramping abdominal pain across her lower abdomen.  She has had significant nausea and dry heaves limiting her oral intake.  Came to ED for same, repeat CT essentially unchanged from prior exam.  Admitted to hospitalist service.  Assessment & Plan: Ulcerative colitis: - Still with some multiple blood BMs per day, though they've slowed - Dr. Elnoria Howard following, knows pt well.   - awaiting quant-gold results prior to starting biologic.  Per Dr. Elnoria Howard, these will not be back until Monday.   - currently on mesalamin oral/rectal plus IV Solu-Medrol. -She has been tolerating her diet well.  Will stop IV fluids. -Continue IV antiemetics and analgesics as needed.   - Due to ongoing symptoms, plan will be to keep in house until quant gold results have returned and we can start monitored infliximab.   Rectal bleeding with mild anemia: In setting of ulcerative colitis. Hemoglobin remained stable.   Complex right hepatic lesion: Again noted on CT imaging.  Further workup with MRI on outpatient basis recommended.  Hyperglycemia: -Secondary to  prednisone use. -Of note A1c elevated 6.4.  Has been on prednisone outpatient as well although likely metabolic derangements not just from prednisone. -Starting sliding scale insulin here.  Will further discuss with patient and watch CBGs in house.   Leukocytosis: -Longstanding, present since 2021.  Downtrending since last admission.  Will monitor.   Hypokalemia: Resolved   Hypertension: Continue losartan.   Hyperlipidemia: Continue atorvastatin.         DVT prophylaxis:  enoxaparin (LOVENOX) injection 40 mg Start: 02/15/24 2200  Code Status:   Code Status: Full Code Level of care: Med-Surg Status is: Inpatient Dispo: Here with [place] through the weekend as we await quant gold results.   Consults called: Gastroenterology  Subjective: Patient feels a little better today.  Had a little trouble sleeping last night secondary to steroids.  Still with multiple bloody bowel movements although reports that bleeding is less than it has been.  No nausea or vomiting.  Objective: Vitals:   02/15/24 1401 02/15/24 2058 02/16/24 0534 02/16/24 1331  BP: (!) 142/78 (!) 141/76 (!) 147/83 135/77  Pulse: 83 96 83 82  Resp: 17 16 17 18   Temp: 100.2 F (37.9 C) 98.5 F (36.9 C) 97.9 F (36.6 C) 98 F (36.7 C)  TempSrc: Oral Oral Oral Oral  SpO2: 95% 95% 94% 95%  Weight:      Height:        Intake/Output Summary (Last 24 hours) at 02/16/2024 1346 Last data filed at 02/16/2024 1300 Gross per 24 hour  Intake 2508.78 ml  Output 1400 ml  Net 1108.78 ml   Filed Weights   02/14/24 1932  Weight: 84  kg   Body mass index is 32.8 kg/m.  Gen: 68 y.o. female in no apparent distress.  Nontoxic Pulm: Non-labored breathing.  Clear to auscultation bilaterally.  CV: Regular rate and rhythm.  GI: Abdomen soft, nondistended.  Tender palpation bilateral lower quadrants.  No guarding or rigidity.  Bowel sounds throughout. Ext: Warm, no deformities, no pedal edema Skin: No rashes, lesions no  ulcers Neuro: Alert and oriented. No focal neurological deficits. Psych: Calm  Judgement and insight appear normal. Mood & affect appropriate.     I have personally reviewed the following labs and images: CBC: Recent Labs  Lab 02/14/24 2020 02/15/24 0500 02/16/24 0405  WBC 13.1* 13.3* 12.5*  NEUTROABS 9.8*  --   --   HGB 10.3* 9.8* 9.3*  HCT 32.3* 31.0* 29.6*  MCV 89.5 90.6 90.0  PLT 522* 488* 484*   BMP &GFR Recent Labs  Lab 02/14/24 2020 02/15/24 0500 02/16/24 0405  NA 135 135 135  K 3.2* 4.2 3.9  CL 97* 101 100  CO2 27 25 26   GLUCOSE 120* 165* 208*  BUN 12 9 9   CREATININE 0.61 0.55 0.59  CALCIUM 8.4* 8.2* 8.3*   Estimated Creatinine Clearance: 68.1 mL/min (by C-G formula based on SCr of 0.59 mg/dL). Liver & Pancreas: Recent Labs  Lab 02/14/24 2020  AST 16  ALT 26  ALKPHOS 77  BILITOT 0.9  PROT 6.4*  ALBUMIN 2.6*   Recent Labs  Lab 02/14/24 2020  LIPASE 19   No results for input(s): "AMMONIA" in the last 168 hours. Diabetic: Recent Labs    02/16/24 0405  HGBA1C 6.4*   Recent Labs  Lab 02/16/24 1143  GLUCAP 236*   Cardiac Enzymes: No results for input(s): "CKTOTAL", "CKMB", "CKMBINDEX", "TROPONINI" in the last 168 hours. No results for input(s): "PROBNP" in the last 8760 hours. Coagulation Profile: No results for input(s): "INR", "PROTIME" in the last 168 hours. Thyroid Function Tests: No results for input(s): "TSH", "T4TOTAL", "FREET4", "T3FREE", "THYROIDAB" in the last 72 hours. Lipid Profile: No results for input(s): "CHOL", "HDL", "LDLCALC", "TRIG", "CHOLHDL", "LDLDIRECT" in the last 72 hours. Anemia Panel: No results for input(s): "VITAMINB12", "FOLATE", "FERRITIN", "TIBC", "IRON", "RETICCTPCT" in the last 72 hours. Urine analysis:    Component Value Date/Time   COLORURINE YELLOW 02/14/2024 2047   APPEARANCEUR HAZY (A) 02/14/2024 2047   LABSPEC 1.026 02/14/2024 2047   PHURINE 5.0 02/14/2024 2047   GLUCOSEU NEGATIVE 02/14/2024 2047    HGBUR NEGATIVE 02/14/2024 2047   BILIRUBINUR SMALL (A) 02/14/2024 2047   KETONESUR NEGATIVE 02/14/2024 2047   PROTEINUR 30 (A) 02/14/2024 2047   NITRITE NEGATIVE 02/14/2024 2047   LEUKOCYTESUR NEGATIVE 02/14/2024 2047   Sepsis Labs: Invalid input(s): "PROCALCITONIN", "LACTICIDVEN"  Microbiology: No results found for this or any previous visit (from the past 240 hours).  Radiology Studies: No results found.   Scheduled Meds:  atorvastatin  40 mg Oral QHS   Chlorhexidine Gluconate Cloth  6 each Topical Daily   enoxaparin (LOVENOX) injection  40 mg Subcutaneous Q24H   insulin aspart  0-15 Units Subcutaneous TID WC   losartan  50 mg Oral Daily   mesalamine  1,000 mg Rectal QHS   mesalamine  4.8 g Oral Q breakfast   methylPREDNISolone (SOLU-MEDROL) injection  40 mg Intravenous Q12H   sodium chloride flush  10-40 mL Intracatheter Q12H   Continuous Infusions:     LOS: 1 day   35 minutes with more than 50% spent in reviewing records, counseling patient/family and coordinating  care.  Tobey Grim, MD [facility] [email or web address] 02/16/2024, 1:46 PM

## 2024-02-17 DIAGNOSIS — K51511 Left sided colitis with rectal bleeding: Secondary | ICD-10-CM | POA: Diagnosis not present

## 2024-02-17 LAB — CBC
HCT: 29.6 % — ABNORMAL LOW (ref 36.0–46.0)
Hemoglobin: 9.2 g/dL — ABNORMAL LOW (ref 12.0–15.0)
MCH: 28.3 pg (ref 26.0–34.0)
MCHC: 31.1 g/dL (ref 30.0–36.0)
MCV: 91.1 fL (ref 80.0–100.0)
Platelets: 491 10*3/uL — ABNORMAL HIGH (ref 150–400)
RBC: 3.25 MIL/uL — ABNORMAL LOW (ref 3.87–5.11)
RDW: 14.3 % (ref 11.5–15.5)
WBC: 13.1 10*3/uL — ABNORMAL HIGH (ref 4.0–10.5)
nRBC: 0 % (ref 0.0–0.2)

## 2024-02-17 LAB — BASIC METABOLIC PANEL WITH GFR
Anion gap: 10 (ref 5–15)
BUN: 10 mg/dL (ref 8–23)
CO2: 27 mmol/L (ref 22–32)
Calcium: 8.3 mg/dL — ABNORMAL LOW (ref 8.9–10.3)
Chloride: 97 mmol/L — ABNORMAL LOW (ref 98–111)
Creatinine, Ser: 0.62 mg/dL (ref 0.44–1.00)
GFR, Estimated: >60 mL/min (ref >60)
Glucose, Bld: 161 mg/dL — ABNORMAL HIGH (ref 70–99)
Potassium: 4 mmol/L (ref 3.5–5.1)
Sodium: 134 mmol/L — ABNORMAL LOW (ref 135–145)

## 2024-02-17 LAB — GLUCOSE, CAPILLARY
Glucose-Capillary: 141 mg/dL — ABNORMAL HIGH (ref 70–99)
Glucose-Capillary: 126 mg/dL — ABNORMAL HIGH (ref 70–99)
Glucose-Capillary: 189 mg/dL — ABNORMAL HIGH (ref 70–99)
Glucose-Capillary: 98 mg/dL (ref 70–99)

## 2024-02-17 MED ORDER — SODIUM CHLORIDE 0.9 % IV SOLN
5.0000 mg/kg | Freq: Once | INTRAVENOUS | Status: AC
  Administered 2024-02-17: 400 mg via INTRAVENOUS
  Filled 2024-02-17: qty 40

## 2024-02-17 NOTE — Progress Notes (Signed)
 Pharmacy Brief Note - infliximab  Assessment: 68 YO female presenting with severe left-sided ulcerative colitis with recent flareup requiring readmission. According to GI, patient doesn't have risk factors for TB (no incarceration, exposure or work in a skilled nursing facility, foreign travel, or other known exposures/contacts) and it is likely that the acute situation with her flare as well as steroid use is the cause of the indeterminant Quantiferon x 2. Pharmacy has been consulted for infliximab dosing.   Plan: Give infliximab 5mg /kg x1 today  Monitor for s/sx of infection

## 2024-02-17 NOTE — Progress Notes (Signed)
 Subjective: Patient seems to be doing slightly better today. She complains of abdominal pain and nausea that has improved with pain medications.  She has had 2 BMs today.  She has not vomited.  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 Jeani Hawking he has researched this extensively and feels comfortable to start the patient on Avsola as she has no risk factors for TB. This is his note to me: Nancy Eaton quantiferon came back again as indeterminant (office blood work), which is not a surprise.  She is similar to Nancy Eaton from last year.  I already counseled her about the indeterminant quantiferon and the need for infliximab the other day.  This is what I wrote for Nancy Eaton last year: After speaking with an IBD colleague it was determined that the patient is safe to start infliximab.  She does not have any risk factors for TB, ie, no incarceration, exposure or work in a skilled nursing facility, foreign travel, or other known exposures/contacts.  It is likely that the acute situation with her flare as well as steroid use is the cause of the indeterminant Quantiferon x 2. Studies evaluating this situation document that this is a common occurrence.  Given the lack of exposure and discussing the pros and cons of infliximab treatment, the consensus with the the patient and her husband is to start the medication.  Please counsel Nancy Eaton again and then consult Pharmacy to start infliximab 5 mg. I have spoken to Nancy Eaton and she is excited to start the medication as she wants to feel better and get back to work soon. I have also spoken to the pharmacist who is agreed to give the patient the medication today.  Objective: Vital signs in last 24 hours: Temp:  [97.9 F (36.6 C)-98.2 F (36.8 C)] 98.2 F (36.8 C) (03/29 0607) Pulse Rate:  [74-84] 74 (03/29 0607) Resp:  [15-18] 15 (03/29 0607) BP: (135-164)/(77-89) 158/89 (03/29 0607) SpO2:  [95 %-97 %] 95 %  (03/29 0607) Last BM Date : 02/15/24  Intake/Output from previous day: 03/28 0701 - 03/29 0700 In: 1748.6 [P.O.:1080; I.V.:668.6] Out: 200 [Stool:200] Intake/Output this shift: No intake/output data recorded.  General appearance: alert, cooperative, appears stated age, fatigued, and no distress Resp: clear to auscultation bilaterally Cardio: regular rate and rhythm, S1, S2 normal, no murmur, click, rub or gallop GI: soft, non-tender; bowel sounds normal; no masses,  no organomegaly  Lab Results: Recent Labs    02/15/24 0500 02/16/24 0405 02/17/24 0349  WBC 13.3* 12.5* 13.1*  HGB 9.8* 9.3* 9.2*  HCT 31.0* 29.6* 29.6*  PLT 488* 484* 491*   BMET Recent Labs    02/15/24 0500 02/16/24 0405 02/17/24 0349  NA 135 135 134*  K 4.2 3.9 4.0  CL 101 100 97*  CO2 25 26 27   GLUCOSE 165* 208* 161*  BUN 9 9 10   CREATININE 0.55 0.59 0.62  CALCIUM 8.2* 8.3* 8.3*   LFT Recent Labs    02/14/24 2020  PROT 6.4*  ALBUMIN 2.6*  AST 16  ALT 26  ALKPHOS 77  BILITOT 0.9   Studies/Results: No results found.  Medications: I have reviewed the patient's current medications. Prior to Admission:  Medications Prior to Admission  Medication Sig Dispense Refill Last Dose/Taking   acetaminophen (TYLENOL) 500 MG tablet Take 500 mg by mouth every 6 (six) hours as needed for moderate pain (pain score 4-6).   02/14/2024 Morning   atorvastatin (  LIPITOR) 40 MG tablet Take 1 tablet (40 mg total) by mouth daily. 90 tablet 0 02/13/2024 Evening   CALCIUM-VITAMIN D PO Take 2 tablets by mouth daily.   Unknown   Glucosamine HCl-MSM (GLUCOSAMINE-MSM PO) Take 1 tablet by mouth daily.   Unknown   losartan (COZAAR) 50 MG tablet Take 50 mg by mouth daily.   02/14/2024 Morning   mesalamine (CANASA) 1000 MG suppository Place 1,000 mg rectally at bedtime.   02/13/2024 Evening   mesalamine (LIALDA) 1.2 g EC tablet Take 2.4 g by mouth daily.   02/14/2024 Morning   Multiple Vitamins-Minerals (MULTIVITAMIN WITH  MINERALS) tablet Take 1 tablet by mouth daily. Women 50+   Unknown   Multiple Vitamins-Minerals (PRESERVISION AREDS 2) CAPS Take 1 tablet by mouth daily.   Unknown   nystatin (MYCOSTATIN) 100000 UNIT/ML suspension Take 4 mLs by mouth 4 (four) times daily.   02/14/2024   Omega-3 Fatty Acids (FISH OIL PO) Take 1,400 mg by mouth daily. Omega3 980 mg   Unknown   predniSONE (DELTASONE) 10 MG tablet Prednisone 40 mg po daily x 1 week then Prednisone 30 mg po daily x 1 week then Prednisone 20 mg po daily x 1 week then Prednisone 10 mg daily x 1 week then Prednisone 5 mg po daily x 1 week, then stop 90 tablet 0 02/14/2024   Turmeric 500 MG CAPS Take 1,500 mg by mouth in the morning, at noon, and at bedtime.       Scheduled:  atorvastatin  40 mg Oral QHS   Chlorhexidine Gluconate Cloth  6 each Topical Daily   enoxaparin (LOVENOX) injection  40 mg Subcutaneous Q24H   insulin aspart  0-15 Units Subcutaneous TID WC   losartan  50 mg Oral Daily   mesalamine  1,000 mg Rectal QHS   mesalamine  4.8 g Oral Q breakfast   methylPREDNISolone (SOLU-MEDROL) injection  40 mg Intravenous Q12H   sodium chloride flush  10-40 mL Intracatheter Q12H   Continuous: ZOX:[id number] **OR** acetaminophen, bisacodyl, HYDROcodone-acetaminophen, morphine injection, ondansetron **OR** ondansetron (ZOFRAN) IV, senna-docusate, simethicone  Assessment/Plan: 1) Severe left-sided ulcerative colitis with recent flareup requiring readmission-as per my discussion with Dr. Jeani Hawking and the pharmacist Nancy Eaton plans are to give the patient a first dose of Avsola today. 2) Anemia of chronic disease 3) Hyperglycemia secondary to Solu-Medrol 4) Hyperlipidemia on Atrovastatin.   LOS: 2 days   Nancy Eaton 02/17/2024, 9:20 AM

## 2024-02-17 NOTE — Plan of Care (Signed)

## 2024-02-17 NOTE — Progress Notes (Signed)
 PROGRESS NOTE  Nancy Eaton  MWU:[id number] DOB: 05-May-1956 DOA: 02/14/2024 PCP: Brayton El, PA-C  Consultants  Brief Narrative: 68 y.o. female with medical history significant for recently diagnosed left-sided ulcerative colitis, HTN, HLD who presented to the ED for evaluation of abdominal pain.  Patient was recently admitted 3/13-3/17 for severe constipation and colitis.  She underwent flexible sigmoidoscopy on 3/14 which was consistent with severe left-sided ulcerative colitis.  Biopsy also consistent with ulcerative colitis.  C. difficile was negative.  GI pathogen panel was positive for rotavirus.  She was treated with IV Solu-Medrol 40 mg q12h, IV Levaquin and Flagyl while in the hospital.  She was discharged on 5-week prednisone taper.   Patient states that she has not really felt much better since leaving the hospital.  She has continued cramping abdominal pain across her lower abdomen.  She has had significant nausea and dry heaves limiting her oral intake.  Came to ED for same, repeat CT essentially unchanged from prior exam.  Admitted to hospitalist service and started on treatment for UC flare (pred/mesalamine) while we await Quant gold results prior to starting infliximab.  Assessment & Plan: Ulcerative colitis: - Still with some multiple blood BMs per day, though they've slowed.  Liquid diet with only minimal nausea.   - seen by Dr. Loreta Ave today.  Per GI, plan will be to start biologic after extensive counseling for risks of TB after indeterminate quant golds x 2.  Low to no risks for TB.   - Pharm consulted today for starting dosage.  Appreciate expertise, including GI input - Still on methylpred and mesalamine, defer to GI when to stop/switch to po steroids -Continue IV antiemetics and analgesics as needed.   - monitor in-house while initiating infliximab   Rectal bleeding with mild anemia: In setting of ulcerative colitis. Hemoglobin remained stable. Still present  but slowing   Complex right hepatic lesion: Again noted on CT imaging.  Further workup with MRI on outpatient basis recommended.  Hyperglycemia: -Secondary to prednisone use. -Of note A1c elevated 6.4.  Has been on prednisone outpatient as well although likely metabolic derangements not just from prednisone. -Starting sliding scale insulin here.  Will watch CBGs in house.   Leukocytosis: -Longstanding, present since 2021.  Downtrending since last admission.  Will monitor - expect this will remain elevated while on steroids .   Hypokalemia: Resolved  Hyponatremia: - has been present previously as well - borderline line, will follow.    Hypertension: Continue losartan.   Hyperlipidemia: Continue atorvastatin.         DVT prophylaxis:  enoxaparin (LOVENOX) injection 40 mg Start: 02/15/24 2200  Code Status:   Code Status: Full Code Level of care: Med-Surg Status is: Inpatient Dispo: Here with [place] through the weekend as we initiate infliximab   Consults called: Gastroenterology  Subjective: Patient feels a little better today.  Sleeping better now, even with steroids.  She feels some relief to be starting definitive therapy.  Blood BMs present but have slowed.   Objective: Vitals:   02/16/24 0534 02/16/24 1331 02/16/24 2131 02/17/24 0607  BP: (!) 147/83 135/77 (!) 164/89 (!) 158/89  Pulse: 83 82 84 74  Resp: 17 18 17 15   Temp: 97.9 F (36.6 C) 98 F (36.7 C) 97.9 F (36.6 C) 98.2 F (36.8 C)  TempSrc: Oral Oral Oral Oral  SpO2: 94% 95% 97% 95%  Weight:      Height:        Intake/Output Summary (Last  24 hours) at 02/17/2024 1024 Last data filed at 02/17/2024 0600 Gross per 24 hour  Intake 1628.58 ml  Output --  Net 1628.58 ml   Filed Weights   02/14/24 1932  Weight: 84 kg   Body mass index is 32.8 kg/m.  Gen: 68 y.o. female in no apparent distress.  Nontoxic Pulm: Non-labored breathing.  Clear to auscultation bilaterally.  CV: Regular rate and  rhythm.  GI: Abdomen soft, nondistended.  Tender palpation bilateral lower quadrants, better than yesterday.  No guarding or rigidity.  Bowel sounds throughout. Ext: Warm, no deformities, no pedal edema Skin: No rashes, lesions no ulcers Neuro: Alert and oriented. No focal neurological deficits. Psych: Calm  Judgement and insight appear normal. Mood & affect appropriate.     I have personally reviewed the following labs and images: CBC: Recent Labs  Lab 02/14/24 2020 02/15/24 0500 02/16/24 0405 02/17/24 0349  WBC 13.1* 13.3* 12.5* 13.1*  NEUTROABS 9.8*  --   --   --   HGB 10.3* 9.8* 9.3* 9.2*  HCT 32.3* 31.0* 29.6* 29.6*  MCV 89.5 90.6 90.0 91.1  PLT 522* 488* 484* 491*   BMP &GFR Recent Labs  Lab 02/14/24 2020 02/15/24 0500 02/16/24 0405 02/17/24 0349  NA 135 135 135 134*  K 3.2* 4.2 3.9 4.0  CL 97* 101 100 97*  CO2 27 25 26 27   GLUCOSE 120* 165* 208* 161*  BUN 12 9 9 10   CREATININE 0.61 0.55 0.59 0.62  CALCIUM 8.4* 8.2* 8.3* 8.3*   Estimated Creatinine Clearance: 68.1 mL/min (by C-G formula based on SCr of 0.62 mg/dL). Liver & Pancreas: Recent Labs  Lab 02/14/24 2020  AST 16  ALT 26  ALKPHOS 77  BILITOT 0.9  PROT 6.4*  ALBUMIN 2.6*   Recent Labs  Lab 02/14/24 2020  LIPASE 19   No results for input(s): "AMMONIA" in the last 168 hours. Diabetic: Recent Labs    02/16/24 0405  HGBA1C 6.4*   Recent Labs  Lab 02/16/24 1143 02/16/24 1638 02/16/24 2130 02/17/24 0753  GLUCAP 236* 194* 155* 141*   Cardiac Enzymes: No results for input(s): "CKTOTAL", "CKMB", "CKMBINDEX", "TROPONINI" in the last 168 hours. No results for input(s): "PROBNP" in the last 8760 hours. Coagulation Profile: No results for input(s): "INR", "PROTIME" in the last 168 hours. Thyroid Function Tests: No results for input(s): "TSH", "T4TOTAL", "FREET4", "T3FREE", "THYROIDAB" in the last 72 hours. Lipid Profile: No results for input(s): "CHOL", "HDL", "LDLCALC", "TRIG",  "CHOLHDL", "LDLDIRECT" in the last 72 hours. Anemia Panel: No results for input(s): "VITAMINB12", "FOLATE", "FERRITIN", "TIBC", "IRON", "RETICCTPCT" in the last 72 hours. Urine analysis:    Component Value Date/Time   COLORURINE YELLOW 02/14/2024 2047   APPEARANCEUR HAZY (A) 02/14/2024 2047   LABSPEC 1.026 02/14/2024 2047   PHURINE 5.0 02/14/2024 2047   GLUCOSEU NEGATIVE 02/14/2024 2047   HGBUR NEGATIVE 02/14/2024 2047   BILIRUBINUR SMALL (A) 02/14/2024 2047   KETONESUR NEGATIVE 02/14/2024 2047   PROTEINUR 30 (A) 02/14/2024 2047   NITRITE NEGATIVE 02/14/2024 2047   LEUKOCYTESUR NEGATIVE 02/14/2024 2047   Sepsis Labs: Invalid input(s): "PROCALCITONIN", "LACTICIDVEN"  Microbiology: No results found for this or any previous visit (from the past 240 hours).  Radiology Studies: No results found.   Scheduled Meds:  atorvastatin  40 mg Oral QHS   Chlorhexidine Gluconate Cloth  6 each Topical Daily   enoxaparin (LOVENOX) injection  40 mg Subcutaneous Q24H   insulin aspart  0-15 Units Subcutaneous TID WC   losartan  50 mg Oral Daily   mesalamine  1,000 mg Rectal QHS   mesalamine  4.8 g Oral Q breakfast   methylPREDNISolone (SOLU-MEDROL) injection  40 mg Intravenous Q12H   sodium chloride flush  10-40 mL Intracatheter Q12H   Continuous Infusions:  inFLIXimab (REMICADE) 5 mg/kg = 400 mg in sodium chloride 0.9 % 250 mL infusion        LOS: 2 days   35 minutes with more than 50% spent in reviewing records, counseling patient/family and coordinating care.  Tobey Grim, MD [facility] [email or web address] 02/17/2024, 10:24 AM

## 2024-02-17 NOTE — Progress Notes (Signed)
CBG 98  

## 2024-02-18 DIAGNOSIS — K51511 Left sided colitis with rectal bleeding: Secondary | ICD-10-CM | POA: Diagnosis not present

## 2024-02-18 LAB — BASIC METABOLIC PANEL WITH GFR
Anion gap: 9 (ref 5–15)
BUN: 9 mg/dL (ref 8–23)
CO2: 29 mmol/L (ref 22–32)
Calcium: 8.6 mg/dL — ABNORMAL LOW (ref 8.9–10.3)
Chloride: 98 mmol/L (ref 98–111)
Creatinine, Ser: 0.68 mg/dL (ref 0.44–1.00)
GFR, Estimated: >60 mL/min (ref >60)
Glucose, Bld: 153 mg/dL — ABNORMAL HIGH (ref 70–99)
Potassium: 4.3 mmol/L (ref 3.5–5.1)
Sodium: 136 mmol/L (ref 135–145)

## 2024-02-18 LAB — CBC
HCT: 31.3 % — ABNORMAL LOW (ref 36.0–46.0)
Hemoglobin: 10 g/dL — ABNORMAL LOW (ref 12.0–15.0)
MCH: 28.8 pg (ref 26.0–34.0)
MCHC: 31.9 g/dL (ref 30.0–36.0)
MCV: 90.2 fL (ref 80.0–100.0)
Platelets: 493 10*3/uL — ABNORMAL HIGH (ref 150–400)
RBC: 3.47 MIL/uL — ABNORMAL LOW (ref 3.87–5.11)
RDW: 14.3 % (ref 11.5–15.5)
WBC: 14.2 10*3/uL — ABNORMAL HIGH (ref 4.0–10.5)
nRBC: 0 % (ref 0.0–0.2)

## 2024-02-18 LAB — GLUCOSE, CAPILLARY
Glucose-Capillary: 149 mg/dL — ABNORMAL HIGH (ref 70–99)
Glucose-Capillary: 139 mg/dL — ABNORMAL HIGH (ref 70–99)
Glucose-Capillary: 218 mg/dL — ABNORMAL HIGH (ref 70–99)

## 2024-02-18 NOTE — Progress Notes (Signed)
 PROGRESS NOTE  Nancy Eaton  ZOX:[id number] DOB: December 22, 1955 DOA: 02/14/2024 PCP: Brayton El, PA-C  Consultants  Brief Narrative: 68 y.o. female with medical history significant for recently diagnosed left-sided ulcerative colitis, HTN, HLD who presented to the ED for evaluation of abdominal pain.  Patient was recently admitted 3/13-3/17 for severe constipation and colitis.  She underwent flexible sigmoidoscopy on 3/14 which was consistent with severe left-sided ulcerative colitis.  Biopsy also consistent with ulcerative colitis.  C. difficile was negative.  GI pathogen panel was positive for rotavirus.  She was treated with IV Solu-Medrol 40 mg q12h, IV Levaquin and Flagyl while in the hospital.  She was discharged on 5-week prednisone taper.   Patient states that she has not really felt much better since leaving the hospital.  She has continued cramping abdominal pain across her lower abdomen.  She has had significant nausea and dry heaves limiting her oral intake.  Came to ED for same, repeat CT essentially unchanged from prior exam.  Admitted to hospitalist service and started on treatment for UC flare (pred/mesalamine).  Had outpatient  QuantiFERON gold's x 2 which were both indeterminate.  Because she has such a low risk for TB started on infliximab this admission 3/29.  Assessment & Plan: Ulcerative colitis: -Status post infliximab infusion yesterday. -Has noted marked decrease in bowel movements as well as blood in her bowel movements since initiation. -Still on full liquid diet. -No further nausea.  No vomiting. -Still on Methylpred and mesalamine.  Continue per GI.   -If continues to improve hopeful for discharge tomorrow.   Rectal bleeding with mild anemia: In setting of ulcerative colitis. Hemoglobin the best it has been since admission.  Following.  Complex right hepatic lesion: Again noted on CT imaging.  Further workup with MRI on outpatient basis  recommended.  Hyperglycemia: -Secondary to prednisone use. -Of note A1c elevated 6.4.  Has been on prednisone outpatient as well although likely metabolic derangements not just from prednisone. -Starting sliding scale insulin here.  Will watch CBGs in house.   Leukocytosis: -Longstanding, present since 2021.  Downtrending since last admission.  Will monitor - expect this will remain elevated while on steroids .   Hypokalemia: Resolved  Hyponatremia: - has been present previously as well - borderline line, will follow.    Hypertension: Continue losartan.   Hyperlipidemia: Continue atorvastatin.    DVT prophylaxis:  enoxaparin (LOVENOX) injection 40 mg Start: 02/15/24 2200  Code Status:   Code Status: Full Code Level of care: Med-Surg Status is: Inpatient Dispo: Here with [place] through the weekend as we monitor after infusion of infliximab.  Hopeful for DC Monday 3/31   Consults called: Gastroenterology  Subjective: Patient feels much better today status post infliximab infusion.  Bowel movements have slowed and blood has also slowed.  She is hungry and would like something other than full liquids but otherwise has no complaints or concerns  Objective: Vitals:   02/17/24 1418 02/17/24 2140 02/18/24 0621 02/18/24 1345  BP: 132/72 139/81 135/82 127/89  Pulse: 85 76 78 76  Resp: 20 18 17 16   Temp: 98.3 F (36.8 C) 98.1 F (36.7 C) 98.2 F (36.8 C) 98.4 F (36.9 C)  TempSrc: Oral Oral Oral Oral  SpO2: 96% 94% 94% 94%  Weight:      Height:        Intake/Output Summary (Last 24 hours) at 02/18/2024 1538 Last data filed at 02/18/2024 1353 Gross per 24 hour  Intake 840 ml  Output --  Net 840 ml   Filed Weights   02/14/24 1932  Weight: 84 kg   Body mass index is 32.8 kg/m.  Gen: 68 y.o. female in no apparent distress.  Nontoxic Pulm: Non-labored breathing.  Clear to auscultation bilaterally.  CV: Regular rate and rhythm.  GI: Abdomen soft, nondistended.   Tenderness now just mild and continues to improve.  No guarding or rigidity.  Bowel sounds throughout. Ext: Warm, no deformities, no pedal edema Skin: No rashes, lesions no ulcers Neuro: Alert and oriented. No focal neurological deficits. Psych: Calm  Judgement and insight appear normal. Mood & affect appropriate.     I have personally reviewed the following labs and images: CBC: Recent Labs  Lab 02/14/24 2020 02/15/24 0500 02/16/24 0405 02/17/24 0349 02/18/24 0311  WBC 13.1* 13.3* 12.5* 13.1* 14.2*  NEUTROABS 9.8*  --   --   --   --   HGB 10.3* 9.8* 9.3* 9.2* 10.0*  HCT 32.3* 31.0* 29.6* 29.6* 31.3*  MCV 89.5 90.6 90.0 91.1 90.2  PLT 522* 488* 484* 491* 493*   BMP &GFR Recent Labs  Lab 02/14/24 2020 02/15/24 0500 02/16/24 0405 02/17/24 0349 02/18/24 0311  NA 135 135 135 134* 136  K 3.2* 4.2 3.9 4.0 4.3  CL 97* 101 100 97* 98  CO2 27 25 26 27 29   GLUCOSE 120* 165* 208* 161* 153*  BUN 12 9 9 10 9   CREATININE 0.61 0.55 0.59 0.62 0.68  CALCIUM 8.4* 8.2* 8.3* 8.3* 8.6*   Estimated Creatinine Clearance: 68.1 mL/min (by C-G formula based on SCr of 0.68 mg/dL). Liver & Pancreas: Recent Labs  Lab 02/14/24 2020  AST 16  ALT 26  ALKPHOS 77  BILITOT 0.9  PROT 6.4*  ALBUMIN 2.6*   Recent Labs  Lab 02/14/24 2020  LIPASE 19   No results for input(s): "AMMONIA" in the last 168 hours. Diabetic: Recent Labs    02/16/24 0405  HGBA1C 6.4*   Recent Labs  Lab 02/17/24 1132 02/17/24 1658 02/17/24 2138 02/18/24 0724 02/18/24 1144  GLUCAP 126* 189* 98 149* 139*   Cardiac Enzymes: No results for input(s): "CKTOTAL", "CKMB", "CKMBINDEX", "TROPONINI" in the last 168 hours. No results for input(s): "PROBNP" in the last 8760 hours. Coagulation Profile: No results for input(s): "INR", "PROTIME" in the last 168 hours. Thyroid Function Tests: No results for input(s): "TSH", "T4TOTAL", "FREET4", "T3FREE", "THYROIDAB" in the last 72 hours. Lipid Profile: No results for  input(s): "CHOL", "HDL", "LDLCALC", "TRIG", "CHOLHDL", "LDLDIRECT" in the last 72 hours. Anemia Panel: No results for input(s): "VITAMINB12", "FOLATE", "FERRITIN", "TIBC", "IRON", "RETICCTPCT" in the last 72 hours. Urine analysis:    Component Value Date/Time   COLORURINE YELLOW 02/14/2024 2047   APPEARANCEUR HAZY (A) 02/14/2024 2047   LABSPEC 1.026 02/14/2024 2047   PHURINE 5.0 02/14/2024 2047   GLUCOSEU NEGATIVE 02/14/2024 2047   HGBUR NEGATIVE 02/14/2024 2047   BILIRUBINUR SMALL (A) 02/14/2024 2047   KETONESUR NEGATIVE 02/14/2024 2047   PROTEINUR 30 (A) 02/14/2024 2047   NITRITE NEGATIVE 02/14/2024 2047   LEUKOCYTESUR NEGATIVE 02/14/2024 2047   Sepsis Labs: Invalid input(s): "PROCALCITONIN", "LACTICIDVEN"  Microbiology: No results found for this or any previous visit (from the past 240 hours).  Radiology Studies: No results found.   Scheduled Meds:  atorvastatin  40 mg Oral QHS   Chlorhexidine Gluconate Cloth  6 each Topical Daily   enoxaparin (LOVENOX) injection  40 mg Subcutaneous Q24H   insulin aspart  0-15 Units Subcutaneous TID WC   losartan  50 mg Oral Daily   mesalamine  1,000 mg Rectal QHS   mesalamine  4.8 g Oral Q breakfast   methylPREDNISolone (SOLU-MEDROL) injection  40 mg Intravenous Q12H   sodium chloride flush  10-40 mL Intracatheter Q12H   Continuous Infusions:    LOS: 3 days   35 minutes with more than 50% spent in reviewing records, counseling patient/family and coordinating care.  Tobey Grim, MD [facility] [email or web address] 02/18/2024, 3:38 PM

## 2024-02-18 NOTE — Plan of Care (Signed)

## 2024-02-18 NOTE — Progress Notes (Signed)
 Subjective: Patient claims she is feeling better today and has had more formed bowel movements even though she has had 4 BMs this morning and 2 BMs through the night.  The amount of blood and mucus in the stool is clearly decreased.  She feels a whole lot better. She still has some abdominal cramping and nausea after she eats and has required some pain medications. She has a cold sore on her right upper lip.  Objective: Vital signs in last 24 hours: Temp:  [98.1 F (36.7 C)-98.3 F (36.8 C)] 98.2 F (36.8 C) (03/30 0621) Pulse Rate:  [76-85] 78 (03/30 0621) Resp:  [17-20] 17 (03/30 0621) BP: (132-139)/(72-82) 135/82 (03/30 0621) SpO2:  [94 %-96 %] 94 % (03/30 0621) Last BM Date : 02/16/24  Intake/Output from previous day: 03/29 0701 - 03/30 0700 In: 600 [P.O.:600] Out: -  Intake/Output this shift: No intake/output data recorded.  General appearance: alert, cooperative, appears stated age, no distress, and mildly obese Resp: clear to auscultation bilaterally Cardio: regular rate and rhythm, S1, S2 normal, no murmur, click, rub or gallop GI: soft, non-tender; bowel sounds normal; no masses,  no organomegaly Extremities: extremities normal, atraumatic, no cyanosis or edema  Lab Results: Recent Labs    02/16/24 0405 02/17/24 0349 02/18/24 0311  WBC 12.5* 13.1* 14.2*  HGB 9.3* 9.2* 10.0*  HCT 29.6* 29.6* 31.3*  PLT 484* 491* 493*   BMET Recent Labs    02/16/24 0405 02/17/24 0349 02/18/24 0311  NA 135 134* 136  K 3.9 4.0 4.3  CL 100 97* 98  CO2 26 27 29   GLUCOSE 208* 161* 153*  BUN 9 10 9   CREATININE 0.59 0.62 0.68  CALCIUM 8.3* 8.3* 8.6*   Studies/Results: No results found.  Medications: I have reviewed the patient's current medications. Prior to Admission:  Medications Prior to Admission  Medication Sig Dispense Refill Last Dose/Taking   acetaminophen (TYLENOL) 500 MG tablet Take 500 mg by mouth every 6 (six) hours as needed for moderate pain (pain score 4-6).    02/14/2024 Morning   atorvastatin (LIPITOR) 40 MG tablet Take 1 tablet (40 mg total) by mouth daily. 90 tablet 0 02/13/2024 Evening   CALCIUM-VITAMIN D PO Take 2 tablets by mouth daily.   Unknown   Glucosamine HCl-MSM (GLUCOSAMINE-MSM PO) Take 1 tablet by mouth daily.   Unknown   losartan (COZAAR) 50 MG tablet Take 50 mg by mouth daily.   02/14/2024 Morning   mesalamine (CANASA) 1000 MG suppository Place 1,000 mg rectally at bedtime.   02/13/2024 Evening   mesalamine (LIALDA) 1.2 g EC tablet Take 2.4 g by mouth daily.   02/14/2024 Morning   Multiple Vitamins-Minerals (MULTIVITAMIN WITH MINERALS) tablet Take 1 tablet by mouth daily. Women 50+   Unknown   Multiple Vitamins-Minerals (PRESERVISION AREDS 2) CAPS Take 1 tablet by mouth daily.   Unknown   nystatin (MYCOSTATIN) 100000 UNIT/ML suspension Take 4 mLs by mouth 4 (four) times daily.   02/14/2024   Omega-3 Fatty Acids (FISH OIL PO) Take 1,400 mg by mouth daily. Omega3 980 mg   Unknown   predniSONE (DELTASONE) 10 MG tablet Prednisone 40 mg po daily x 1 week then Prednisone 30 mg po daily x 1 week then Prednisone 20 mg po daily x 1 week then Prednisone 10 mg daily x 1 week then Prednisone 5 mg po daily x 1 week, then stop 90 tablet 0 02/14/2024   Turmeric 500 MG CAPS Take 1,500 mg by mouth in the morning,  at noon, and at bedtime.       Scheduled:  atorvastatin  40 mg Oral QHS   Chlorhexidine Gluconate Cloth  6 each Topical Daily   enoxaparin (LOVENOX) injection  40 mg Subcutaneous Q24H   insulin aspart  0-15 Units Subcutaneous TID WC   losartan  50 mg Oral Daily   mesalamine  1,000 mg Rectal QHS   mesalamine  4.8 g Oral Q breakfast   methylPREDNISolone (SOLU-MEDROL) injection  40 mg Intravenous Q12H   sodium chloride flush  10-40 mL Intracatheter Q12H   Continuous: [id number] **OR** acetaminophen, bisacodyl, HYDROcodone-acetaminophen, morphine injection, ondansetron **OR** ondansetron (ZOFRAN) IV, senna-docusate,  simethicone  Assessment/Plan: 1) Severe left-sided ulcerative colitis with recent flareup requiring readmission-patient received her first dose of Avsola yesterday and seems to be feeling much better we will continue the steroids for now and monitor her GI symptoms for the next day or so prior to discharge.Marland Kitchen 2) Anemia of chronic disease 3) Hyperglycemia secondary to Solu-Medrol 4) Hyperlipidemia on Atrovastatin  LOS: 3 days   Nancy Eaton 02/18/2024, 7:55 AM

## 2024-02-19 DIAGNOSIS — K51511 Left sided colitis with rectal bleeding: Secondary | ICD-10-CM | POA: Diagnosis not present

## 2024-02-19 LAB — CBC
HCT: 29.6 % — ABNORMAL LOW (ref 36.0–46.0)
Hemoglobin: 9.3 g/dL — ABNORMAL LOW (ref 12.0–15.0)
MCH: 28.6 pg (ref 26.0–34.0)
MCHC: 31.4 g/dL (ref 30.0–36.0)
MCV: 91.1 fL (ref 80.0–100.0)
Platelets: 438 10*3/uL — ABNORMAL HIGH (ref 150–400)
RBC: 3.25 MIL/uL — ABNORMAL LOW (ref 3.87–5.11)
RDW: 14.1 % (ref 11.5–15.5)
WBC: 9 10*3/uL (ref 4.0–10.5)
nRBC: 0 % (ref 0.0–0.2)

## 2024-02-19 LAB — GLUCOSE, CAPILLARY
Glucose-Capillary: 140 mg/dL — ABNORMAL HIGH (ref 70–99)
Glucose-Capillary: 155 mg/dL — ABNORMAL HIGH (ref 70–99)
Glucose-Capillary: 204 mg/dL — ABNORMAL HIGH (ref 70–99)
Glucose-Capillary: 164 mg/dL — ABNORMAL HIGH (ref 70–99)

## 2024-02-19 LAB — BASIC METABOLIC PANEL WITH GFR
Anion gap: 9 (ref 5–15)
BUN: 11 mg/dL (ref 8–23)
CO2: 27 mmol/L (ref 22–32)
Calcium: 8.2 mg/dL — ABNORMAL LOW (ref 8.9–10.3)
Chloride: 96 mmol/L — ABNORMAL LOW (ref 98–111)
Creatinine, Ser: 0.6 mg/dL (ref 0.44–1.00)
GFR, Estimated: >60 mL/min (ref >60)
Glucose, Bld: 201 mg/dL — ABNORMAL HIGH (ref 70–99)
Potassium: 4.3 mmol/L (ref 3.5–5.1)
Sodium: 132 mmol/L — ABNORMAL LOW (ref 135–145)

## 2024-02-19 MED ORDER — ALUM & MAG HYDROXIDE-SIMETH 200-200-20 MG/5ML PO SUSP
30.0000 mL | ORAL | Status: DC | PRN
  Administered 2024-02-19: 30 mL via ORAL
  Filled 2024-02-19: qty 30

## 2024-02-19 NOTE — Plan of Care (Signed)
   Problem: Nutrition: Goal: Adequate nutrition will be maintained Outcome: Progressing   Problem: Coping: Goal: Level of anxiety will decrease Outcome: Progressing   Problem: Elimination: Goal: Will not experience complications related to bowel motility Outcome: Progressing Goal: Will not experience complications related to urinary retention Outcome: Progressing

## 2024-02-19 NOTE — Progress Notes (Signed)
 Subjective: Abdominal discomfort and cramping few times a day and decreased requiring pain medications. She had 3 loose bowel movements today.   Objective: Vital signs in last 24 hours: Temp:  [98.4 F (36.9 C)-98.6 F (37 C)] 98.5 F (36.9 C) (03/31 0610) Pulse Rate:  [67-76] 67 (03/31 0610) Resp:  [15-18] 15 (03/31 0610) BP: (127-145)/(75-89) 145/83 (03/31 0610) SpO2:  [94 %-95 %] 94 % (03/31 0610) Last BM Date : 02/18/24  Intake/Output from previous day: 03/30 0701 - 03/31 0700 In: 1260 [P.O.:1260] Out: 2 [Stool:2] Intake/Output this shift: Total I/O In: 300 [P.O.:300] Out: 2 [Stool:2]  General appearance: alert, cooperative, appears stated age, no distress, and moderate distress; crusting lesion on up[per lip Resp: clear to auscultation bilaterally Cardio: regular rate and rhythm, S1, S2 normal, no murmur, click, rub or gallop GI: soft, non-tender; bowel sounds normal; no masses,  no organomegaly  Lab Results: Recent Labs    02/17/24 0349 02/18/24 0311 02/19/24 0306  WBC 13.1* 14.2* 9.0  HGB 9.2* 10.0* 9.3*  HCT 29.6* 31.3* 29.6*  PLT 491* 493* 438*   BMET Recent Labs    02/17/24 0349 02/18/24 0311 02/19/24 0306  NA 134* 136 132*  K 4.0 4.3 4.3  CL 97* 98 96*  CO2 27 29 27   GLUCOSE 161* 153* 201*  BUN 10 9 11   CREATININE 0.62 0.68 0.60  CALCIUM 8.3* 8.6* 8.2*   Studies/Results: No results found.  Medications: I have reviewed the patient's current medications. Prior to Admission:  Medications Prior to Admission  Medication Sig Dispense Refill Last Dose/Taking   acetaminophen (TYLENOL) 500 MG tablet Take 500 mg by mouth every 6 (six) hours as needed for moderate pain (pain score 4-6).   02/14/2024 Morning   atorvastatin (LIPITOR) 40 MG tablet Take 1 tablet (40 mg total) by mouth daily. 90 tablet 0 02/13/2024 Evening   CALCIUM-VITAMIN D PO Take 2 tablets by mouth daily.   Unknown   Glucosamine HCl-MSM (GLUCOSAMINE-MSM PO) Take 1 tablet by mouth daily.    Unknown   losartan (COZAAR) 50 MG tablet Take 50 mg by mouth daily.   02/14/2024 Morning   mesalamine (CANASA) 1000 MG suppository Place 1,000 mg rectally at bedtime.   02/13/2024 Evening   mesalamine (LIALDA) 1.2 g EC tablet Take 2.4 g by mouth daily.   02/14/2024 Morning   Multiple Vitamins-Minerals (MULTIVITAMIN WITH MINERALS) tablet Take 1 tablet by mouth daily. Women 50+   Unknown   Multiple Vitamins-Minerals (PRESERVISION AREDS 2) CAPS Take 1 tablet by mouth daily.   Unknown   nystatin (MYCOSTATIN) 100000 UNIT/ML suspension Take 4 mLs by mouth 4 (four) times daily.   02/14/2024   Omega-3 Fatty Acids (FISH OIL PO) Take 1,400 mg by mouth daily. Omega3 980 mg   Unknown   predniSONE (DELTASONE) 10 MG tablet Prednisone 40 mg po daily x 1 week then Prednisone 30 mg po daily x 1 week then Prednisone 20 mg po daily x 1 week then Prednisone 10 mg daily x 1 week then Prednisone 5 mg po daily x 1 week, then stop 90 tablet 0 02/14/2024   Turmeric 500 MG CAPS Take 1,500 mg by mouth in the morning, at noon, and at bedtime.       Scheduled:  atorvastatin  40 mg Oral QHS   Chlorhexidine Gluconate Cloth  6 each Topical Daily   enoxaparin (LOVENOX) injection  40 mg Subcutaneous Q24H   insulin aspart  0-15 Units Subcutaneous TID WC   losartan  50  mg Oral Daily   mesalamine  1,000 mg Rectal QHS   mesalamine  4.8 g Oral Q breakfast   methylPREDNISolone (SOLU-MEDROL) injection  40 mg Intravenous Q12H   sodium chloride flush  10-40 mL Intracatheter Q12H   Continuous: [id number] **OR** acetaminophen, bisacodyl, HYDROcodone-acetaminophen, morphine injection, ondansetron **OR** ondansetron (ZOFRAN) IV, senna-docusate, simethicone  Assessment/Plan: 1) Severe left-sided ulcerative colitis with recent flareup requiring readmission-patient received her first dose of Avsola & on Prednisolone & Mesalamine-seems to be feeling much better we will continue the steroids for now and monitor her GI symptoms for the next  day or so prior to discharge.Marland Kitchen 2) Anemia of chronic disease 3) Hyperglycemia secondary to Solu-Medrol 4) Hyperlipidemia on Atrovastatin  LOS: 4 days   [name] 02/19/2024, 6:53 AM

## 2024-02-19 NOTE — Progress Notes (Signed)
 PROGRESS NOTE  Nancy Eaton  ZHY:[id number] DOB: 09/28/1956 DOA: 02/14/2024 PCP: Brayton El, PA-C  Consultants  Brief Narrative: 68 y.o. female with medical history significant for recently diagnosed left-sided ulcerative colitis, HTN, HLD who presented to the ED for evaluation of abdominal pain.  Patient was recently admitted 3/13-3/17 for severe constipation and colitis.  She underwent flexible sigmoidoscopy on 3/14 which was consistent with severe left-sided ulcerative colitis.  Biopsy also consistent with ulcerative colitis.  C. difficile was negative.  GI pathogen panel was positive for rotavirus.  She was treated with IV Solu-Medrol 40 mg q12h, IV Levaquin and Flagyl while in the hospital.  She was discharged on 5-week prednisone taper.   Patient states that she has not really felt much better since leaving the hospital.  She has continued cramping abdominal pain across her lower abdomen.  She has had significant nausea and dry heaves limiting her oral intake.  Came to ED for same, repeat CT essentially unchanged from prior exam.  Admitted to hospitalist service and started on treatment for UC flare (pred/mesalamine).  Had outpatient  QuantiFERON gold's x 2 which were both indeterminate.  Because she has such a low risk for TB started on infliximab this admission 3/29.  Assessment & Plan: Ulcerative colitis: -Status post infliximab infusion  -Has noted marked decrease in bowel movements as well as blood in her bowel movements since infusion -Still on full liquid diet.  Seen by GI today.  Plan is to continue mesalamine and steroids.   -No further nausea.  No vomiting. - moving from full liquid to soft diet today as challenge.  If tolerates and continues to improve hopeful for discharge tomorrow 02/20/24 with GI blessing.   Rectal bleeding with mild anemia: In setting of ulcerative colitis. Hemoglobin the best it has been since admission.  Following.  Complex right hepatic  lesion: Again noted on CT imaging.  Further workup with MRI on outpatient basis recommended.  Hyperglycemia: -Secondary to prednisone use. -Of note A1c elevated 6.4.  Has been on prednisone outpatient as well although likely metabolic derangements not just from prednisone. -Starting sliding scale insulin here.  Will watch CBGs in house.   Leukocytosis: -Longstanding, present since 2021.  Downtrending since last admission.  Will monitor - expect this will remain elevated while on steroids .   Hypokalemia: Resolved  Hyponatremia: - has been present previously as well - borderline line, will follow.    Hypertension: Continue losartan.   Hyperlipidemia: Continue atorvastatin.    DVT prophylaxis:  enoxaparin (LOVENOX) injection 40 mg Start: 02/15/24 2200  Code Status:   Code Status: Full Code Level of care: Med-Surg Status is: Inpatient   Consults called: Gastroenterology  Subjective: Continues to feel better.  She is hungry.  No nausea or vomiting.  Still with some loose bowel movements but almost no blood anymore.  Objective: Vitals:   02/18/24 1345 02/18/24 2207 02/19/24 0610 02/19/24 1316  BP: 127/89 (!) 141/75 (!) 145/83 (!) 150/86  Pulse: 76 72 67 81  Resp: 16 18 15    Temp: 98.4 F (36.9 C) 98.6 F (37 C) 98.5 F (36.9 C) 98.3 F (36.8 C)  TempSrc: Oral Oral Oral Oral  SpO2: 94% 95% 94% 97%  Weight:      Height:        Intake/Output Summary (Last 24 hours) at 02/19/2024 1508 Last data filed at 02/19/2024 1400 Gross per 24 hour  Intake 1950 ml  Output 2 ml  Net 1948 ml  Filed Weights   02/14/24 1932  Weight: 84 kg   Body mass index is 32.8 kg/m.  Gen: 68 y.o. female in no apparent distress.  Nontoxic Pulm: Non-labored breathing.  Clear to auscultation bilaterally.  CV: Regular rate and rhythm.  GI: Abdomen soft, nondistended.  Tenderness now just mild right lower quadrant and continues to improve.  No guarding or rigidity.  Bowel sounds  throughout. Ext: Warm, no deformities, no pedal edema Skin: No rashes, lesions no ulcers Neuro: Alert and oriented. No focal neurological deficits. Psych: Calm  Judgement and insight appear normal. Mood & affect appropriate.     I have personally reviewed the following labs and images: CBC: Recent Labs  Lab 02/14/24 2020 02/15/24 0500 02/16/24 0405 02/17/24 0349 02/18/24 0311 02/19/24 0306  WBC 13.1* 13.3* 12.5* 13.1* 14.2* 9.0  NEUTROABS 9.8*  --   --   --   --   --   HGB 10.3* 9.8* 9.3* 9.2* 10.0* 9.3*  HCT 32.3* 31.0* 29.6* 29.6* 31.3* 29.6*  MCV 89.5 90.6 90.0 91.1 90.2 91.1  PLT 522* 488* 484* 491* 493* 438*   BMP &GFR Recent Labs  Lab 02/15/24 0500 02/16/24 0405 02/17/24 0349 02/18/24 0311 02/19/24 0306  NA 135 135 134* 136 132*  K 4.2 3.9 4.0 4.3 4.3  CL 101 100 97* 98 96*  CO2 25 26 27 29 27   GLUCOSE 165* 208* 161* 153* 201*  BUN 9 9 10 9 11   CREATININE 0.55 0.59 0.62 0.68 0.60  CALCIUM 8.2* 8.3* 8.3* 8.6* 8.2*   Estimated Creatinine Clearance: 68.1 mL/min (by C-G formula based on SCr of 0.6 mg/dL). Liver & Pancreas: Recent Labs  Lab 02/14/24 2020  AST 16  ALT 26  ALKPHOS 77  BILITOT 0.9  PROT 6.4*  ALBUMIN 2.6*   Recent Labs  Lab 02/14/24 2020  LIPASE 19   No results for input(s): "AMMONIA" in the last 168 hours. Diabetic: No results for input(s): "HGBA1C" in the last 72 hours.  Recent Labs  Lab 02/18/24 0724 02/18/24 1144 02/18/24 1611 02/19/24 0721 02/19/24 1143  GLUCAP 149* 139* 218* 140* 155*   Cardiac Enzymes: No results for input(s): "CKTOTAL", "CKMB", "CKMBINDEX", "TROPONINI" in the last 168 hours. No results for input(s): "PROBNP" in the last 8760 hours. Coagulation Profile: No results for input(s): "INR", "PROTIME" in the last 168 hours. Thyroid Function Tests: No results for input(s): "TSH", "T4TOTAL", "FREET4", "T3FREE", "THYROIDAB" in the last 72 hours. Lipid Profile: No results for input(s): "CHOL", "HDL", "LDLCALC",  "TRIG", "CHOLHDL", "LDLDIRECT" in the last 72 hours. Anemia Panel: No results for input(s): "VITAMINB12", "FOLATE", "FERRITIN", "TIBC", "IRON", "RETICCTPCT" in the last 72 hours. Urine analysis:    Component Value Date/Time   COLORURINE YELLOW 02/14/2024 2047   APPEARANCEUR HAZY (A) 02/14/2024 2047   LABSPEC 1.026 02/14/2024 2047   PHURINE 5.0 02/14/2024 2047   GLUCOSEU NEGATIVE 02/14/2024 2047   HGBUR NEGATIVE 02/14/2024 2047   BILIRUBINUR SMALL (A) 02/14/2024 2047   KETONESUR NEGATIVE 02/14/2024 2047   PROTEINUR 30 (A) 02/14/2024 2047   NITRITE NEGATIVE 02/14/2024 2047   LEUKOCYTESUR NEGATIVE 02/14/2024 2047   Sepsis Labs: Invalid input(s): "PROCALCITONIN", "LACTICIDVEN"  Microbiology: No results found for this or any previous visit (from the past 240 hours).  Radiology Studies: No results found.   Scheduled Meds:  atorvastatin  40 mg Oral QHS   Chlorhexidine Gluconate Cloth  6 each Topical Daily   enoxaparin (LOVENOX) injection  40 mg Subcutaneous Q24H   insulin aspart  0-15  Units Subcutaneous TID WC   losartan  50 mg Oral Daily   mesalamine  1,000 mg Rectal QHS   mesalamine  4.8 g Oral Q breakfast   methylPREDNISolone (SOLU-MEDROL) injection  40 mg Intravenous Q12H   sodium chloride flush  10-40 mL Intracatheter Q12H   Continuous Infusions:    LOS: 4 days   35 minutes with more than 50% spent in reviewing records, counseling patient/family and coordinating care.  Tobey Grim, MD [facility] [email or web address] 02/19/2024, 3:08 PM

## 2024-02-20 ENCOUNTER — Other Ambulatory Visit (HOSPITAL_COMMUNITY): Payer: Self-pay

## 2024-02-20 DIAGNOSIS — K51511 Left sided colitis with rectal bleeding: Secondary | ICD-10-CM | POA: Diagnosis not present

## 2024-02-20 LAB — BASIC METABOLIC PANEL WITH GFR
Anion gap: 10 (ref 5–15)
BUN: 17 mg/dL (ref 8–23)
CO2: 26 mmol/L (ref 22–32)
Calcium: 8.2 mg/dL — ABNORMAL LOW (ref 8.9–10.3)
Chloride: 95 mmol/L — ABNORMAL LOW (ref 98–111)
Creatinine, Ser: 0.59 mg/dL (ref 0.44–1.00)
GFR, Estimated: >60 mL/min (ref >60)
Glucose, Bld: 219 mg/dL — ABNORMAL HIGH (ref 70–99)
Potassium: 4.1 mmol/L (ref 3.5–5.1)
Sodium: 131 mmol/L — ABNORMAL LOW (ref 135–145)

## 2024-02-20 LAB — CBC
HCT: 31.6 % — ABNORMAL LOW (ref 36.0–46.0)
Hemoglobin: 9.9 g/dL — ABNORMAL LOW (ref 12.0–15.0)
MCH: 28.2 pg (ref 26.0–34.0)
MCHC: 31.3 g/dL (ref 30.0–36.0)
MCV: 90 fL (ref 80.0–100.0)
Platelets: 456 10*3/uL — ABNORMAL HIGH (ref 150–400)
RBC: 3.51 MIL/uL — ABNORMAL LOW (ref 3.87–5.11)
RDW: 14.5 % (ref 11.5–15.5)
WBC: 8.7 10*3/uL (ref 4.0–10.5)
nRBC: 0.2 % (ref 0.0–0.2)

## 2024-02-20 LAB — GLUCOSE, CAPILLARY: Glucose-Capillary: 167 mg/dL — ABNORMAL HIGH (ref 70–99)

## 2024-02-20 MED ORDER — PREDNISONE 10 MG PO TABS
ORAL_TABLET | ORAL | 0 refills | Status: AC
  Filled 2024-02-20: qty 73, 34d supply, fill #0

## 2024-02-20 NOTE — Progress Notes (Signed)
 Patient discharged home, midline removed, discharge paperwork provided and explained, patient verbalized understanding, awaiting ordered discharge medications to be delivered at the bedside prior to patient leaving the hospital.

## 2024-02-20 NOTE — Progress Notes (Signed)
 Ordered medications delivered to bedside prior to patient discharge.

## 2024-02-20 NOTE — Discharge Summary (Signed)
 Physician Discharge Summary  Rand Boller Ashburn UYQ:[id number] DOB: 1956-01-09 DOA: 02/14/2024  PCP: Brayton El, PA-C  Admit date: 02/14/2024 Discharge date: 02/20/2024  Admitted From: home Disposition:  home  Recommendations for Outpatient Follow-up:  Follow up with PCP in 1-2 weeks Follow-up with gastroenterology as an outpatient 1 to 2 weeks  Home Health: none Equipment/Devices: none  Discharge Condition: stable CODE STATUS: Full code  HPI: Per admitting MD, Nancy Eaton is a 68 y.o. female with medical history significant for recently diagnosed left-sided ulcerative colitis, HTN, HLD who presented to the ED for evaluation of abdominal pain. Patient was recently admitted 3/13-3/17 for severe constipation and colitis.  She underwent flexible sigmoidoscopy on 3/14 which was consistent with severe left-sided ulcerative colitis.  Biopsy also consistent with ulcerative colitis.  C. difficile was negative.  GI pathogen panel was positive for rotavirus.  She was treated with IV Solu-Medrol 40 mg q12h, IV Levaquin and Flagyl while in the hospital.  She was discharged on 5-week prednisone taper. Patient states that she has not really felt much better since leaving the hospital.  She has continued cramping abdominal pain across her lower abdomen.  She has had significant nausea and dry heaves limiting her oral intake.  She has had intermittent chills and diaphoresis. She reports that her bowel movements consist of mucousy stool with some blood mixed in including clots. She states that she has been taking prednisone as prescribed, initially 40 mg for 1 week now on 30 mg.  Hospital Course / Discharge diagnoses: Principal Problem:   Ulcerative colitis, left sided, with rectal bleeding (HCC) Active Problems:   Hyperlipidemia   Hepatic lesion   Hypokalemia   Ulcerative colitis (HCC)  Principal problem Ulcerative colitis -patient admitted to the hospital with ulcerative colitis  flareup.  Gastroenterology consulted and followed patient while hospitalized.  She was placed on IV steroids, also received infliximab infusion.  She has had gradual improvement, diet was advanced, and overall she is feeling better.  She is tolerating a regular diet, and with improvement she will be discharged home in stable condition.  Case was discussed with Dr. Loreta Ave on the day of discharge, she recommends a slow steroid taper, continue mesalamine and she will have follow-up as an outpatient.  Active problems Rectal bleeding with mild anemia-in the setting of ulcerative colitis, rectal bleeding now resolved Complex right hepatic lesion-MRI as an outpatient Hyperglycemia, steroid-induced-A1c at 6.4.  CBGs acceptable Hypokalemia-replenished Hyponatremia - overall stable Essential hypertension-continue medications Hyperlipidemia-continue home medications Obesity, class I-BMI 30  Sepsis ruled out   Discharge Instructions   Allergies as of 02/20/2024       Reactions   Penicillins Rash   20 years  Did it involve swelling of the face/tongue/throat, SOB, or low BP? Nancy Eaton Did it involve sudden or severe rash/hives, skin peeling, or any reaction on the inside of your mouth or nose? Yes Did you need to seek medical attention at a hospital or doctor's office? Nancy Eaton When did it last happen? Over 20 Years       If all above answers are "Nancy Eaton", may proceed with cephalosporin use.        Medication List     TAKE these medications    acetaminophen 500 MG tablet Commonly known as: TYLENOL Take 500 mg by mouth every 6 (six) hours as needed for moderate pain (pain score 4-6).   atorvastatin 40 MG tablet Commonly known as: LIPITOR Take 1 tablet (40 mg total) by mouth daily.  CALCIUM-VITAMIN D PO Take 2 tablets by mouth daily.   FISH OIL PO Take 1,400 mg by mouth daily. Omega3 980 mg   GLUCOSAMINE-MSM PO Take 1 tablet by mouth daily.   losartan 50 MG tablet Commonly known as: COZAAR Take  50 mg by mouth daily.   mesalamine 1.2 g EC tablet Commonly known as: LIALDA Take 2.4 g by mouth daily.   mesalamine 1000 MG suppository Commonly known as: CANASA Place 1,000 mg rectally at bedtime.   multivitamin with minerals tablet Take 1 tablet by mouth daily. Women 50+   PreserVision AREDS 2 Caps Take 1 tablet by mouth daily.   nystatin 100000 UNIT/ML suspension Commonly known as: MYCOSTATIN Take 4 mLs by mouth 4 (four) times daily.   predniSONE 10 MG tablet Commonly known as: DELTASONE Take 4 tablets (40 mg total) by mouth daily with breakfast for 7 days, THEN 3 tablets (30 mg total) daily with breakfast for 7 days, THEN 2 tablets (20 mg total) daily with breakfast for 7 days, THEN 1 tablet (10 mg total) daily with breakfast for 7 days, THEN 0.5 tablets (5 mg total) daily with breakfast for 6 days. Prednisone 40 mg po daily x 1 week then Prednisone 30 mg po daily x 1 week then Prednisone 20 mg po daily x 1 week then Prednisone 10 mg daily x 1 week then Prednisone 5 mg po daily x 1 week, then stop. Start taking on: February 20, 2024 What changed: See the new instructions.   Turmeric 500 MG Caps Take 1,500 mg by mouth in the morning, at noon, and at bedtime.       Consultations: GI  Procedures/Studies:  CT ABDOMEN PELVIS W CONTRAST Result Date: 02/14/2024 CLINICAL DATA:  Lower abdominal pain with nausea. Recent diagnosis of ulcerative colitis. EXAM: CT ABDOMEN AND PELVIS WITH CONTRAST TECHNIQUE: Multidetector CT imaging of the abdomen and pelvis was performed using the standard protocol following bolus administration of intravenous contrast. RADIATION DOSE REDUCTION: This exam was performed according to the departmental dose-optimization program which includes automated exposure control, adjustment of the mA and/or kV according to patient size and/or use of iterative reconstruction technique. CONTRAST:  OMNIPAQUE IOHEXOL 300 MG/ML  SOLN COMPARISON:  02/01/2024. FINDINGS:  Lower chest: Atelectasis or scarring is noted at the lung bases, greater on the left than on the right. Hepatobiliary: There is a complex irregular hypodensity in the right lobe of the liver measuring 2.8 x 2.5 cm. Fatty infiltration of the liver is noted. The gallbladder is without stones. Nancy Eaton biliary ductal dilatation is seen. Pancreas: Unremarkable. Nancy Eaton pancreatic ductal dilatation or surrounding inflammatory changes. Spleen: Normal in size without focal abnormality. Adrenals/Urinary Tract: The adrenal glands are within normal limits. The kidneys enhance symmetrically. Nancy Eaton renal calculus or hydronephrosis bilaterally. Subcentimeter hypodensities are present in the left kidney which are too small to further characterize. The bladder is unremarkable. Stomach/Bowel: The stomach is within normal limits. Nancy Eaton bowel obstruction, free air, or pneumatosis is seen. There is colonic wall thickening with surrounding fat stranding involving the descending and sigmoid colon and rectum. Scattered diverticula are noted along the colon without evidence of diverticulitis. There is a large quantity of stool in the ascending and transverse colon. The appendix is not seen. Vascular/Lymphatic: Aortic atherosclerosis. Prominent lymph nodes are present in the retroperitoneum and mesentery, which may be reactive. Reproductive: Uterus and bilateral adnexa are unremarkable. Other: Small amount of free fluid is noted in the right pericolic gutter. There is a fat containing umbilical  hernia. Musculoskeletal: Bilateral breast implants are noted degenerative changes are present in the thoracolumbar spine. Nancy Eaton acute osseous abnormality. IMPRESSION: 1. Colonic wall thickening with surrounding fat stranding involving the descending colon, sigmoid colon, and rectum, suggesting infectious or inflammatory colitis and may be associated with patient's history of ulcerative colitis. Findings are not significantly changed from the previous exam. 2.  Diverticulosis without diverticulitis. 3. Indeterminate complex hypodensity in the right lobe of the liver. Nonemergent MRI with contrast is recommended for further characterization. 4. Hepatic steatosis. 5. Aortic atherosclerosis. Electronically Signed   By: Thornell Sartorius M.D.   On: 02/14/2024 22:02   US Abdomen Limited RUQ (LIVER/GB) Result Date: 02/02/2024 CLINICAL DATA:  Liver mass. EXAM: ULTRASOUND ABDOMEN LIMITED RIGHT UPPER QUADRANT COMPARISON:  February 01, 2024. FINDINGS: Gallbladder: Nancy Eaton gallstones or wall thickening visualized. Nancy Eaton sonographic Murphy sign noted by sonographer. Common bile duct: Diameter: 4 mm which is within normal limits. Liver: Grossly normal echogenicity of hepatic parenchyma. 1.7 x 1.2 x 1.3 cm rounded echogenic structure is noted with larger hypoechoic area adjacent to it; while this may represent a complex hemangioma, other pathology cannot be excluded. Portal vein is patent on color Doppler imaging with normal direction of blood flow towards the liver. Other: None. IMPRESSION: Complex right hepatic lesion is noted which has large hypoechoic component and peripheral rounded echogenic solid component. This most likely corresponds to abnormality seen on CT scan. If this is a hemangioma, it is a complex hemangioma given that it is not homogeneously hyperechoic. MRI with and without gadolinium is recommended to rule out other pathology. Electronically Signed   By: Lupita Raider M.D.   On: 02/02/2024 08:24   CT ABDOMEN PELVIS W CONTRAST Result Date: 02/01/2024 CLINICAL DATA:  Nausea, vomiting. EXAM: CT ABDOMEN AND PELVIS WITH CONTRAST TECHNIQUE: Multidetector CT imaging of the abdomen and pelvis was performed using the standard protocol following bolus administration of intravenous contrast. RADIATION DOSE REDUCTION: This exam was performed according to the departmental dose-optimization program which includes automated exposure control, adjustment of the mA and/or kV according to  patient size and/or use of iterative reconstruction technique. CONTRAST:  OMNIPAQUE IOHEXOL 300 MG/ML  SOLN COMPARISON:  January 28, 2024 FINDINGS: Lower chest: Minimal left basilar subsegmental atelectasis. Hepatobiliary: Nancy Eaton cholelithiasis or biliary dilatation is noted. Stable appearance of 3.1 x 2.9 cm lobulated hypodensity seen in posterior segment of right hepatic lobe laterally with possible peripheral nodular enhancement. This may simply represent hemangioma, but these images are not diagnostic for this at this time. Pancreas: Unremarkable. Nancy Eaton pancreatic ductal dilatation or surrounding inflammatory changes. Spleen: Normal in size without focal abnormality. Adrenals/Urinary Tract: Adrenal glands are unremarkable. Kidneys are normal, without renal calculi, focal lesion, or hydronephrosis. Bladder is unremarkable. Stomach/Bowel: The stomach is unremarkable. The appendix is not clearly visualized. There is Nancy Eaton evidence of small-bowel obstruction. Continued moderate wall thickening of descending and sigmoid colon is noted most consistent with infectious or inflammatory colitis, with mildly enlarged adjacent lymph nodes suggesting inflammatory process. Vascular/Lymphatic: Nancy Eaton significant vascular findings are present. Nancy Eaton enlarged abdominal or pelvic lymph nodes. Reproductive: Uterus and bilateral adnexa are unremarkable. Other: Nancy Eaton abdominal wall hernia or abnormality. Nancy Eaton abdominopelvic ascites. Musculoskeletal: Nancy Eaton acute or significant osseous findings. IMPRESSION: Continued moderate wall thickening of descending and sigmoid colon is noted most consistent with infectious or inflammatory colitis as noted on prior exam. Large amount of stool is seen in the more proximal colon suggesting some degree of obstruction secondary to the inflammation. Stable appearance of 3.1  x 2.9 cm lobulated low density with possible peripheral enhancement seen in posterior segment of right hepatic lobe. Potentially this may represent  hemangioma, but these images are not diagnostic for this. Further evaluation with MRI or ultrasound is recommended to rule out other pathology. Electronically Signed   By: Lupita Raider M.D.   On: 02/01/2024 16:40   CT ABDOMEN PELVIS W CONTRAST Result Date: 01/28/2024 CLINICAL DATA:  Acute abdominal pain.  Bright red blood per rectum. EXAM: CT ABDOMEN AND PELVIS WITH CONTRAST TECHNIQUE: Multidetector CT imaging of the abdomen and pelvis was performed using the standard protocol following bolus administration of intravenous contrast. RADIATION DOSE REDUCTION: This exam was performed according to the departmental dose-optimization program which includes automated exposure control, adjustment of the mA and/or kV according to patient size and/or use of iterative reconstruction technique. CONTRAST:  OMNIPAQUE IOHEXOL 300 MG/ML  SOLN COMPARISON:  None Available. FINDINGS: Lower chest: Nancy Eaton acute abnormality. Hepatobiliary: There is a 2.3 x 2.1 cm hypodense area in the right lobe of the liver with slightly lobulated border measuring 28 Hounsfield units. Otherwise, the liver is within normal limits. Gallbladder and bile ducts are within normal limits. Pancreas: Unremarkable. Nancy Eaton pancreatic ductal dilatation or surrounding inflammatory changes. Spleen: Normal in size without focal abnormality. Adrenals/Urinary Tract: Adrenal glands are unremarkable. Kidneys are normal, without renal calculi, focal lesion, or hydronephrosis. Bladder is unremarkable. Stomach/Bowel: There circumferential wall thickening and inflammation involving the descending colon, sigmoid colon and rectum. There are multiple small pericolonic lymph nodes. There is Nancy Eaton perforation or abscess. There is Nancy Eaton bowel obstruction, but the colon proximal to this level is distended with a large amount of stool. Appendix is not well seen. Small bowel and stomach are within normal limits. Vascular/Lymphatic: Aortic atherosclerosis. Nancy Eaton enlarged abdominal or pelvic  lymph nodes. Reproductive: Uterus and bilateral adnexa are unremarkable. Other: There is a small fat containing umbilical hernia. There is Nancy Eaton ascites. Musculoskeletal: Degenerative changes affect the spine. IMPRESSION: 1. Circumferential wall thickening and inflammation involving the descending colon, sigmoid colon and rectum compatible with infectious/inflammatory colitis. Nancy Eaton perforation or abscess. 2. The colon proximal to this level is distended with a large amount of stool. 3. 2.3 cm hypodense area in the right lobe of the liver with slightly lobulated border. This is indeterminate. Further evaluation with ultrasound recommended. 4. Aortic atherosclerosis. Aortic Atherosclerosis (ICD10-I70.0). Electronically Signed   By: Darliss Cheney M.D.   On: 01/28/2024 17:33     Subjective: - Nancy Eaton chest pain, shortness of breath, Nancy Eaton abdominal pain, nausea or vomiting.   Discharge Exam: BP 137/71 (BP Location: Right Arm)   Pulse 77   Temp 98 F (36.7 C) (Oral)   Resp 18   Ht 5\' 3"  (1.6 m)   Wt 84 kg   SpO2 95%   BMI 32.80 kg/m   General: Pt is alert, awake, not in acute distress Cardiovascular: RRR, S1/S2 +, Nancy Eaton rubs, Nancy Eaton gallops Respiratory: CTA bilaterally, Nancy Eaton wheezing, Nancy Eaton rhonchi Abdominal: Soft, NT, ND, bowel sounds + Extremities: Nancy Eaton edema, Nancy Eaton cyanosis    The results of significant diagnostics from this hospitalization (including imaging, microbiology, ancillary and laboratory) are listed below for reference.     Microbiology: Nancy Eaton results found for this or any previous visit (from the past 240 hours).   Labs: Basic Metabolic Panel: Recent Labs  Lab 02/16/24 0405 02/17/24 0349 02/18/24 0311 02/19/24 0306 02/20/24 0413  NA 135 134* 136 132* 131*  K 3.9 4.0 4.3 4.3 4.1  CL 100  97* 98 96* 95*  CO2 26 27 29 27 26   GLUCOSE 208* 161* 153* 201* 219*  BUN 9 10 9 11 17   CREATININE 0.59 0.62 0.68 0.60 0.59  CALCIUM 8.3* 8.3* 8.6* 8.2* 8.2*   Liver Function Tests: Recent Labs  Lab  02/14/24 2020  AST 16  ALT 26  ALKPHOS 77  BILITOT 0.9  PROT 6.4*  ALBUMIN 2.6*   CBC: Recent Labs  Lab 02/14/24 2020 02/15/24 0500 02/16/24 0405 02/17/24 0349 02/18/24 0311 02/19/24 0306 02/20/24 0413  WBC 13.1*   < > 12.5* 13.1* 14.2* 9.0 8.7  NEUTROABS 9.8*  --   --   --   --   --   --   HGB 10.3*   < > 9.3* 9.2* 10.0* 9.3* 9.9*  HCT 32.3*   < > 29.6* 29.6* 31.3* 29.6* 31.6*  MCV 89.5   < > 90.0 91.1 90.2 91.1 90.0  PLT 522*   < > 484* 491* 493* 438* 456*   < > = values in this interval not displayed.   CBG: Recent Labs  Lab 02/19/24 0721 02/19/24 1143 02/19/24 1624 02/19/24 2113 02/20/24 0722  GLUCAP 140* 155* 204* 164* 167*   Hgb A1c Nancy Eaton results for input(s): "HGBA1C" in the last 72 hours. Lipid Profile Nancy Eaton results for input(s): "CHOL", "HDL", "LDLCALC", "TRIG", "CHOLHDL", "LDLDIRECT" in the last 72 hours. Thyroid function studies Nancy Eaton results for input(s): "TSH", "T4TOTAL", "T3FREE", "THYROIDAB" in the last 72 hours.  Invalid input(s): "FREET3" Urinalysis    Component Value Date/Time   COLORURINE YELLOW 02/14/2024 2047   APPEARANCEUR HAZY (A) 02/14/2024 2047   LABSPEC 1.026 02/14/2024 2047   PHURINE 5.0 02/14/2024 2047   GLUCOSEU NEGATIVE 02/14/2024 2047   HGBUR NEGATIVE 02/14/2024 2047   BILIRUBINUR SMALL (A) 02/14/2024 2047   KETONESUR NEGATIVE 02/14/2024 2047   PROTEINUR 30 (A) 02/14/2024 2047   NITRITE NEGATIVE 02/14/2024 2047   LEUKOCYTESUR NEGATIVE 02/14/2024 2047    FURTHER DISCHARGE INSTRUCTIONS:   Get Medicines reviewed and adjusted: Please take all your medications with you for your next visit with your Primary MD   Laboratory/radiological data: Please request your Primary MD to go over all hospital tests and procedure/radiological results at the follow up, please ask your Primary MD to get all Hospital records sent to his/her office.   In some cases, they will be blood work, cultures and biopsy results pending at the time of your  discharge. Please request that your primary care M.D. goes through all the records of your hospital data and follows up on these results.   Also Note the following: If you experience worsening of your admission symptoms, develop shortness of breath, life threatening emergency, suicidal or homicidal thoughts you must seek medical attention immediately by calling 911 or calling your MD immediately  if symptoms less severe.   You must read complete instructions/literature along with all the possible adverse reactions/side effects for all the Medicines you take and that have been prescribed to you. Take any new Medicines after you have completely understood and accpet all the possible adverse reactions/side effects.    Do not drive when taking Pain medications or sleeping medications (Benzodaizepines)   Do not take more than prescribed Pain, Sleep and Anxiety Medications. It is not advisable to combine anxiety,sleep and pain medications without talking with your primary care practitioner   Special Instructions: If you have smoked or chewed Tobacco  in the last 2 yrs please stop smoking, stop any regular Alcohol  and  or any Recreational drug use.   Wear Seat belts while driving.   Please note: You were cared for by a hospitalist during your hospital stay. Once you are discharged, your primary care physician will handle any further medical issues. Please note that Nancy Eaton REFILLS for any discharge medications will be authorized once you are discharged, as it is imperative that you return to your primary care physician (or establish a relationship with a primary care physician if you do not have one) for your post hospital discharge needs so that they can reassess your need for medications and monitor your lab values.  Time coordinating discharge: 35 minutes  SIGNED:  Pamella Pert, MD, PhD 02/20/2024, 8:59 AM

## 2024-02-20 NOTE — Care Management Important Message (Signed)
 Important Message  Patient Details IM Letter was given to the Patient Name: Nancy Eaton MRN: [id number] Date of Birth: [birth year]   Important Message Given:  Yes - Medicare IM     Caren Macadam 02/20/2024, 9:35 AM

## 2024-04-08 ENCOUNTER — Encounter (HOSPITAL_BASED_OUTPATIENT_CLINIC_OR_DEPARTMENT_OTHER): Payer: Self-pay | Admitting: Emergency Medicine

## 2024-04-08 ENCOUNTER — Emergency Department (HOSPITAL_BASED_OUTPATIENT_CLINIC_OR_DEPARTMENT_OTHER): Admission: EM | Admit: 2024-04-08 | Discharge: 2024-04-08 | Disposition: A | Discharge status: Home / Self Care

## 2024-04-08 ENCOUNTER — Other Ambulatory Visit: Payer: Self-pay

## 2024-04-08 ENCOUNTER — Ambulatory Visit: Payer: Self-pay

## 2024-04-08 DIAGNOSIS — Z23 Encounter for immunization: Secondary | ICD-10-CM | POA: Diagnosis not present

## 2024-04-08 DIAGNOSIS — Z79899 Other long term (current) drug therapy: Secondary | ICD-10-CM | POA: Diagnosis not present

## 2024-04-08 DIAGNOSIS — S61311A Laceration without foreign body of left index finger with damage to nail, initial encounter: Secondary | ICD-10-CM | POA: Diagnosis not present

## 2024-04-08 DIAGNOSIS — S6992XA Unspecified injury of left wrist, hand and finger(s), initial encounter: Secondary | ICD-10-CM | POA: Diagnosis present

## 2024-04-08 DIAGNOSIS — W260XXA Contact with knife, initial encounter: Secondary | ICD-10-CM | POA: Diagnosis not present

## 2024-04-08 MED ORDER — OXYCODONE HCL 5 MG PO TABS: 5.0000 mg | ORAL_TABLET | Freq: Four times a day (QID) | ORAL | 0 refills | Status: DC | PRN

## 2024-04-08 MED ORDER — TETANUS-DIPHTH-ACELL PERTUSSIS 5-2.5-18.5 LF-MCG/0.5 IM SUSY
0.5000 mL | PREFILLED_SYRINGE | Freq: Once | INTRAMUSCULAR | Status: AC
  Administered 2024-04-08: 0.5 mL via INTRAMUSCULAR
  Filled 2024-04-08: qty 0.5

## 2024-04-08 NOTE — Discharge Instructions (Addendum)
 The cut to your fingernail does not require any sutures.  However, please keep the dressing applied today intact for the next 24 hours to help stop the bleeding.    You may gently clean the area around your laceration as needed with water after the next 24 hours. Place antibiotic ointment such as bacitracin or neosporin over your laceration after cleaning the area.  Keep the laceration covered with sterile gauze as shown here if you are doing an activity in which it may get dirty. You may pick these supplies up at any drugstore.  Do not submerge your laceration in water (no baths, swimming) until it is fully healed. You may shower.   You may take up to 1000mg  of tylenol  every 6 hours as needed for pain. Do not take more then 4g per day.   You have been prescribed Oxycodone -this is a narcotic/controlled substance medication that has potential addicting qualities.  You may take 1 tablet every 6 hours as needed for severe pain.  Do not drive or operate heavy machinery when taking this medicine as it can be sedating. Do not drink alcohol or take other sedating medications when taking this medicine for safety reasons.  Keep this out of reach of small children.    Your tetanus was updated today.  Return to the ER should you develop fever, chills, pus drainage from your wound, redness around your wound.

## 2024-04-08 NOTE — Telephone Encounter (Addendum)
 Copied from CRM [id number]. Topic: Clinical - Red Word Triage >> Apr 08, 2024  8:09 AM Nancy Eaton wrote: Red Word that prompted transfer to Nurse Triage:  deep cut on finger  Agent reporting that pt hung up before transfer to nurse complete.  Chief Complaint: finger laceration Symptoms: initial gushing blood, throbbing pain, interrupted sleep Frequency: continual Pertinent Negatives: Patient denies bleeding through bandage Disposition: [] 911 / [x] ED /[] Urgent Care (no appt availability in office) / [] Appointment(In office/virtual)/ []  [facility]/ [] Home Care/ [] Refused Recommended Disposition /[] [facility]/ []  Follow-up with PCP Additional Notes: Pt reporting that she cut her index finger with a shiatsu knife yesterday and it cut through finger nail, pt applied tight bandage but accidentally knocked off the bandage and was bleeding still or again, reporting the throbbing kept her awake all night. Pt reporting that it was hard to visualize the cut due to the amount of bleeding, unable to fully assess since wrapped tightly, confirms not bled through bandage. Pt unsure when received last tetanus booster but thinks within last 5 years. Advised pt to go to ED for extent of injury. Pt verbalized understanding and intent to go.  Reason for Disposition  [1] Bleeding AND [2] won't stop after 10 minutes of direct pressure (using correct technique)  Answer Assessment - Initial Assessment Questions 2. SIZE: "How large is the cut?"      Whole length of finger nail 3. BLEEDING: "Is it bleeding now?" If Yes, ask: "Is it difficult to stop?"      Kept bleeding so much gushing blood 4. LOCATION: "Where is the injury located?"      Pointer finger 5. ONSET: "How long ago did the injury occur?"      Last night 6. MECHANISM: "Tell me how it happened."      Shiatsu knife cut through finger nail, put tight bandage on there but accidentally knocked it off and still bleeding, throbbing and kept  me awake all night, felt like toothache in finger, think maybe hit a nerve 7. TETANUS: "When was the last tetanus booster?"     Think hasn't been that long  Protocols used: Cuts and Lacerations-A-AH

## 2024-04-08 NOTE — ED Triage Notes (Signed)
 Lac to left index finger. States cut though finger nail. Pain causing trouble sleeping.

## 2024-04-08 NOTE — ED Notes (Signed)
 Reviewed AVS/discharge instruction with patient. Time allotted for and all questions answered. Patient is agreeable for d/c and escorted to ed exit by staff.

## 2024-04-08 NOTE — ED Provider Notes (Signed)
 [place] EMERGENCY DEPARTMENT AT [facility] Provider Note   CSN: [id number] Arrival date & time: 04/08/24  1126     History  Chief Complaint  Patient presents with   Laceration    Nancy Eaton is a 68 y.o. female with no significant past medical history who presents with concern for cutting her left index finger nail yesterday at approximately 3 PM.  States she was cutting something with a sharp knife which slipped and cut into the top of her nail.  Reports the area has been bleeding intermittently since the episode.  Denies any numbness or tingling in the finger.  Reports pain has not been well-controlled with Tylenol  at home.  Reports she is unable to take NSAIDs due to ulcerative colitis. Unknown when last tetanus was.    Laceration      Home Medications Prior to Admission medications   Medication Sig Start Date End Date Taking? Authorizing Provider  oxyCODONE  (ROXICODONE ) 5 MG immediate release tablet Take 1 tablet (5 mg total) by mouth every 6 (six) hours as needed for severe pain (pain score 7-10) or breakthrough pain (Pain not controlled with tylenol ). 04/08/24  Yes Rexie Catena, PA-C  acetaminophen  (TYLENOL ) 500 MG tablet Take 500 mg by mouth every 6 (six) hours as needed for moderate pain (pain score 4-6).    [provider]  atorvastatin  (LIPITOR) 40 MG tablet Take 1 tablet (40 mg total) by mouth daily. 02/25/19   McGowen, Minetta Aly, MD  CALCIUM -VITAMIN D PO Take 2 tablets by mouth daily.    [provider]  Glucosamine HCl-MSM (GLUCOSAMINE-MSM PO) Take 1 tablet by mouth daily.    [provider]  losartan  (COZAAR ) 50 MG tablet Take 50 mg by mouth daily. 01/03/24   [provider]  mesalamine  (CANASA ) 1000 MG suppository Place 1,000 mg rectally at bedtime. 02/07/24   [provider]  mesalamine  (LIALDA ) 1.2 g EC tablet Take 2.4 g by mouth daily. 02/07/24   [provider]  Multiple Vitamins-Minerals  (MULTIVITAMIN WITH MINERALS) tablet Take 1 tablet by mouth daily. Women 50+    [provider]  Multiple Vitamins-Minerals (PRESERVISION AREDS 2) CAPS Take 1 tablet by mouth daily.    [provider]  nystatin (MYCOSTATIN) 100000 UNIT/ML suspension Take 4 mLs by mouth 4 (four) times daily. 02/08/24   [provider]  Omega-3 Fatty Acids (FISH OIL PO) Take 1,400 mg by mouth daily. Omega3 980 mg    [provider]  Turmeric 500 MG CAPS Take 1,500 mg by mouth in the morning, at noon, and at bedtime.     [provider]      Allergies    Penicillins    Review of Systems   Review of Systems  Skin:  Positive for wound.    Physical Exam Updated Vital Signs BP (!) 151/96 (BP Location: Right Arm)   Pulse 80   Temp 98.4 F (36.9 C)   Resp 16   SpO2 98%  Physical Exam Vitals and nursing note reviewed.  Constitutional:      Appearance: Normal appearance.  HENT:     Head: Atraumatic.  Pulmonary:     Effort: Pulmonary effort is normal.  Musculoskeletal:     Comments: Left Index finger. General Cut over the top of patient's left index finger nail with slight amount of bleeding noted.  Nail still intact at the nailbed.  No laceration through the surrounding skin.  No retained foreign body.   ROM Full  flexion extension at the wrist Full range of motion of the left index finger MCP, PIP, DIP  Sensation: Sensation intact throughout the left index finger   Neurological:     General: No focal deficit present.     Mental Status: She is alert.  Psychiatric:        Mood and Affect: Mood normal.        Behavior: Behavior normal.       ED Results / Procedures / Treatments   Labs (all labs ordered are listed, but only abnormal results are displayed) Labs Reviewed - No data to display  EKG None  Radiology No results found.  Procedures Procedures    Medications Ordered in ED Medications  Tdap (BOOSTRIX ) injection 0.5 mL (has no  administration in time range)    ED Course/ Medical Decision Making/ A&P                                 Medical Decision Making    Differential diagnosis includes but is not limited to laceration, tendon injury, nerve injury  ED Course:  Upon initial evaluation, patient is well-appearing.  Has a cut over her left index finger fingernail.  The laceration does not extend into the surrounding soft tissues.  Nail intact at the nailbed.  Neurovascularly intact of the left index finger and patient has full range of motion of the left index finger.  No concern for underlying nerve or tendon injury.  No retained foreign body.  Given location of the cut, do not feel patient needs sutures at this time.  Bleeding well-controlled with pressure.  The area was dressed with nonstick gauze and dressing applied.  Tetanus was updated.  Patient stable and appropriate for discharge home. Patient reports pain not well-controlled with Tylenol  at home, and the pain is keeping her up at night.  Will prescribe short course (8) oxycodone  to use at home as patient cannot use NSAIDs due to her ulcerative colitis.   Impression: Laceration overlying left index finger nailbed  Disposition:  The patient was discharged home with instructions to keep dressing applied for the next 24 hours to get the bleeding to resolve.  Do not submerge the area in water until fully healed.  Keep area covered until fully healed.  Tylenol  as needed for pain. Oxycodone  for breakthrough pain. She understands this may make her drowsy and she may not drink or drive while taking this medication Return precautions given.    This chart was dictated using voice recognition software, Dragon. Despite the best efforts of this provider to proofread and correct errors, errors may still occur which can change documentation meaning.          Final Clinical Impression(s) / ED Diagnoses Final diagnoses:  Laceration of left index finger without  foreign body with damage to nail, initial encounter    Rx / DC Orders ED Discharge Orders          Ordered    oxyCODONE  (ROXICODONE ) 5 MG immediate release tablet  Every 6 hours PRN        04/08/24 1238              Rexie Catena, PA-C 04/08/24 1243    Carin Charleston, MD 04/08/24 1556

## 2024-06-23 ENCOUNTER — Other Ambulatory Visit: Payer: Self-pay

## 2024-06-23 ENCOUNTER — Encounter (HOSPITAL_BASED_OUTPATIENT_CLINIC_OR_DEPARTMENT_OTHER): Payer: Self-pay | Admitting: Emergency Medicine

## 2024-06-23 ENCOUNTER — Emergency Department (HOSPITAL_BASED_OUTPATIENT_CLINIC_OR_DEPARTMENT_OTHER)

## 2024-06-23 ENCOUNTER — Emergency Department (HOSPITAL_BASED_OUTPATIENT_CLINIC_OR_DEPARTMENT_OTHER)
Admission: EM | Admit: 2024-06-23 | Discharge: 2024-06-23 | Disposition: A | Discharge status: Home / Self Care | Attending: Emergency Medicine | Admitting: Emergency Medicine

## 2024-06-23 DIAGNOSIS — R109 Unspecified abdominal pain: Secondary | ICD-10-CM | POA: Insufficient documentation

## 2024-06-23 DIAGNOSIS — R197 Diarrhea, unspecified: Secondary | ICD-10-CM | POA: Diagnosis present

## 2024-06-23 DIAGNOSIS — K5180 Other ulcerative colitis without complications: Secondary | ICD-10-CM

## 2024-06-23 LAB — URINALYSIS, ROUTINE W REFLEX MICROSCOPIC
Bilirubin Urine: NEGATIVE
Glucose, UA: NEGATIVE mg/dL
Ketones, ur: NEGATIVE mg/dL
Leukocytes,Ua: NEGATIVE
Nitrite: NEGATIVE
Protein, ur: 30 mg/dL — AB
Specific Gravity, Urine: 1.02 (ref 1.005–1.030)
pH: 5.5 (ref 5.0–8.0)

## 2024-06-23 LAB — GASTROINTESTINAL PANEL BY PCR, STOOL (REPLACES STOOL CULTURE)

## 2024-06-23 LAB — CBC WITH DIFFERENTIAL/PLATELET
Abs Granulocyte: 11.9 K/uL — ABNORMAL HIGH (ref 1.5–6.5)
Abs Immature Granulocytes: 0.08 K/uL — ABNORMAL HIGH (ref 0.00–0.07)
Basophils Absolute: 0.1 K/uL (ref 0.0–0.1)
Basophils Relative: 0 %
Eosinophils Absolute: 0 K/uL (ref 0.0–0.5)
Eosinophils Relative: 0 %
HCT: 34.6 % — ABNORMAL LOW (ref 36.0–46.0)
Hemoglobin: 11.3 g/dL — ABNORMAL LOW (ref 12.0–15.0)
Immature Granulocytes: 1 %
Lymphocytes Relative: 10 %
Lymphs Abs: 1.6 K/uL (ref 0.7–4.0)
MCH: 26.8 pg (ref 26.0–34.0)
MCHC: 32.7 g/dL (ref 30.0–36.0)
MCV: 82.2 fL (ref 80.0–100.0)
Monocytes Absolute: 2.7 K/uL — ABNORMAL HIGH (ref 0.1–1.0)
Monocytes Relative: 17 %
Neutro Abs: 11.9 K/uL — ABNORMAL HIGH (ref 1.7–7.7)
Neutrophils Relative %: 72 %
Platelets: 487 K/uL — ABNORMAL HIGH (ref 150–400)
RBC: 4.21 MIL/uL (ref 3.87–5.11)
RDW: 14.6 % (ref 11.5–15.5)
Smear Review: NORMAL
WBC: 16.4 K/uL — ABNORMAL HIGH (ref 4.0–10.5)
nRBC: 0 % (ref 0.0–0.2)

## 2024-06-23 LAB — URINALYSIS, MICROSCOPIC (REFLEX)

## 2024-06-23 LAB — COMPREHENSIVE METABOLIC PANEL WITH GFR
ALT: 12 U/L (ref 0–44)
AST: 13 U/L — ABNORMAL LOW (ref 15–41)
Albumin: 3.7 g/dL (ref 3.5–5.0)
Alkaline Phosphatase: 85 U/L (ref 38–126)
Anion gap: 15 (ref 5–15)
BUN: 11 mg/dL (ref 8–23)
CO2: 22 mmol/L (ref 22–32)
Calcium: 9.3 mg/dL (ref 8.9–10.3)
Chloride: 98 mmol/L (ref 98–111)
Creatinine, Ser: 0.63 mg/dL (ref 0.44–1.00)
GFR, Estimated: >60 mL/min (ref >60)
Glucose, Bld: 106 mg/dL — ABNORMAL HIGH (ref 70–99)
Potassium: 3.9 mmol/L (ref 3.5–5.1)
Sodium: 134 mmol/L — ABNORMAL LOW (ref 135–145)
Total Bilirubin: 0.3 mg/dL (ref 0.0–1.2)
Total Protein: 7.3 g/dL (ref 6.5–8.1)

## 2024-06-23 LAB — LIPASE, BLOOD: Lipase: <10 U/L — ABNORMAL LOW (ref 11–51)

## 2024-06-23 LAB — C DIFFICILE QUICK SCREEN W PCR REFLEX
C Diff antigen: NEGATIVE
C Diff interpretation: NOT DETECTED
C Diff toxin: NEGATIVE

## 2024-06-23 MED ORDER — ONDANSETRON HCL 4 MG/2ML IJ SOLN
4.0000 mg | Freq: Once | INTRAMUSCULAR | Status: AC
  Administered 2024-06-23: 4 mg via INTRAVENOUS
  Filled 2024-06-23: qty 2

## 2024-06-23 MED ORDER — OXYCODONE HCL 5 MG PO TABS
5.0000 mg | ORAL_TABLET | ORAL | 0 refills | Status: DC | PRN
Start: 2024-06-23 — End: 2024-07-19

## 2024-06-23 MED ORDER — OXYCODONE HCL 5 MG PO TABS
5.0000 mg | ORAL_TABLET | Freq: Once | ORAL | Status: AC
  Administered 2024-06-23: 5 mg via ORAL
  Filled 2024-06-23: qty 1

## 2024-06-23 MED ORDER — PREDNISONE 50 MG PO TABS
60.0000 mg | ORAL_TABLET | Freq: Once | ORAL | Status: AC
  Administered 2024-06-23: 60 mg via ORAL
  Filled 2024-06-23: qty 1

## 2024-06-23 MED ORDER — PREDNISONE 10 MG PO TABS: 40.0000 mg | ORAL_TABLET | Freq: Every day | ORAL | 0 refills | Status: AC

## 2024-06-23 MED ORDER — IOHEXOL 300 MG/ML  SOLN
75.0000 mL | Freq: Once | INTRAMUSCULAR | Status: AC | PRN
  Administered 2024-06-23: 75 mL via INTRAVENOUS

## 2024-06-23 MED ORDER — SODIUM CHLORIDE 0.9 % IV BOLUS
1000.0000 mL | Freq: Once | INTRAVENOUS | Status: AC
  Administered 2024-06-23: 1000 mL via INTRAVENOUS

## 2024-06-23 MED ORDER — ONDANSETRON 4 MG PO TBDP: 4.0000 mg | ORAL_TABLET | Freq: Three times a day (TID) | ORAL | 0 refills | Status: DC | PRN

## 2024-06-23 NOTE — ED Provider Notes (Signed)
 [place] EMERGENCY DEPARTMENT AT [facility] Provider Note   CSN: [id number] Arrival date & time: 06/23/24  1326     Patient presents with: Diarrhea and Abdominal Pain   Nancy Eaton is a 68 y.o. female.    Diarrhea Associated symptoms: abdominal pain   Abdominal Pain Associated symptoms: diarrhea   Patient with diarrhea abdominal pain.  Worsening since Friday.  Has however been feeling bad for longer than that.  Has had a dental infection has been on 3 different antibiotics over most of last month.  Just recently finished up clindamycin .  Dr. Iverson is her gastroenterologist.  Has had infusions for her ulcerative colitis.    Past Medical History:  Diagnosis Date   Arthritis    Knee   Hyperlipidemia    Lovastatin  started approx 2011   IFG (impaired fasting glucose) 2018   HbA1c 5.9% (stable)   PONV (postoperative nausea and vomiting)    Pre-diabetes    Right knee pain 12/2019   End stage: TKA recommended by ortho 01/2020->pt considering   Sigmoid diverticulosis 04/2010                   History of ulcerative colitis.  Prior to Admission medications   Medication Sig Start Date End Date Taking? Authorizing Provider  acetaminophen  (TYLENOL ) 500 MG tablet Take 500 mg by mouth every 6 (six) hours as needed for moderate pain (pain score 4-6).    [provider]  atorvastatin  (LIPITOR) 40 MG tablet Take 1 tablet (40 mg total) by mouth daily. 02/25/19   McGowen, Aleene DEL, MD  CALCIUM -VITAMIN D PO Take 2 tablets by mouth daily.    [provider]  Glucosamine HCl-MSM (GLUCOSAMINE-MSM PO) Take 1 tablet by mouth daily.    [provider]  losartan  (COZAAR ) 50 MG tablet Take 50 mg by mouth daily. 01/03/24   [provider]  mesalamine  (CANASA ) 1000 MG suppository Place 1,000 mg rectally at bedtime. 02/07/24   [provider]  mesalamine  (LIALDA ) 1.2 g EC tablet Take 2.4 g by mouth daily. 02/07/24   [provider]   Multiple Vitamins-Minerals (MULTIVITAMIN WITH MINERALS) tablet Take 1 tablet by mouth daily. Women 50+    [provider]  Multiple Vitamins-Minerals (PRESERVISION AREDS 2) CAPS Take 1 tablet by mouth daily.    [provider]  nystatin (MYCOSTATIN) 100000 UNIT/ML suspension Take 4 mLs by mouth 4 (four) times daily. 02/08/24   [provider]  Omega-3 Fatty Acids (FISH OIL PO) Take 1,400 mg by mouth daily. Omega3 980 mg    [provider]  oxyCODONE  (ROXICODONE ) 5 MG immediate release tablet Take 1 tablet (5 mg total) by mouth every 6 (six) hours as needed for severe pain (pain score 7-10) or breakthrough pain (Pain not controlled with tylenol ). 04/08/24   Veta Palma, PA-C  Turmeric 500 MG CAPS Take 1,500 mg by mouth in the morning, at noon, and at bedtime.     [provider]    Allergies: Penicillins    Review of Systems  Gastrointestinal:  Positive for abdominal pain and diarrhea.    Updated Vital Signs BP 132/72 (BP Location: Right Arm)   Pulse 94   Temp 98.8 F (37.1 C)   Resp 18   Ht 5' 3 (1.6 m)   Wt 84 kg   SpO2 97%   BMI 32.80 kg/m   Physical Exam Vitals reviewed.  Cardiovascular:     Rate and Rhythm: Normal rate.  Abdominal:     Tenderness: There is abdominal tenderness.     Comments: Diffuse abdominal tenderness without rebound or guarding.  No hernia palpated.  Neurological:     Mental Status: She is alert.     (all labs ordered are listed, but only abnormal results are displayed) Labs Reviewed  LIPASE, BLOOD - Abnormal; Notable for the following components:      Result Value   Lipase <10 (*)    All other components within normal limits  COMPREHENSIVE METABOLIC PANEL WITH GFR - Abnormal; Notable for the following components:   Sodium 134 (*)    Glucose, Bld 106 (*)    AST 13 (*)    All other components within normal limits  URINALYSIS, ROUTINE W REFLEX MICROSCOPIC - Abnormal; Notable for the following  components:   APPearance CLOUDY (*)    Hgb urine dipstick SMALL (*)    Protein, ur 30 (*)    All other components within normal limits  CBC WITH DIFFERENTIAL/PLATELET - Abnormal; Notable for the following components:   WBC 16.4 (*)    Hemoglobin 11.3 (*)    HCT 34.6 (*)    Platelets 487 (*)    Neutro Abs 11.9 (*)    Monocytes Absolute 2.7 (*)    Abs Immature Granulocytes 0.08 (*)    Abs Granulocyte 11.9 (*)    All other components within normal limits  URINALYSIS, MICROSCOPIC (REFLEX) - Abnormal; Notable for the following components:   Bacteria, UA MANY (*)    All other components within normal limits  GASTROINTESTINAL PANEL BY PCR, STOOL (REPLACES STOOL CULTURE)  C DIFFICILE QUICK SCREEN W PCR REFLEX      EKG: None  Radiology: No results found.   Procedures   Medications Ordered in the ED  sodium chloride  0.9 % bolus 1,000 mL (1,000 mLs Intravenous New Bag/Given 06/23/24 1437)  ondansetron  (ZOFRAN ) injection 4 mg (4 mg Intravenous Given 06/23/24 1435)                                    Medical Decision Making Amount and/or Complexity of Data Reviewed Labs: ordered. Radiology: ordered.  Risk Prescription drug management.   Patient with abdominal pain.  Diarrhea.  History of ulcerative colitis.  Has however been on antibiotics for dental infection.  Differential diagnosis does include causes such as worsening ulcerative colitis, C. difficile, infection.  Will get blood work.  Will give fluid bolus and antiemetic.  Also stool studies sent.  However discussed with lab and they have to go to [facility].  White count is elevated.  With the tenderness we will get CT scan.  Care turned over to Dr. Dreama     Final diagnoses:  None    ED Discharge Orders     None          Patsey Lot, MD 06/23/24 [phone]

## 2024-06-23 NOTE — ED Triage Notes (Signed)
 Pt reports diarrhea since Fri with abd cramping; reports was taking Clindamycin  and is concerned because she read that medication can cause C Diff

## 2024-06-23 NOTE — ED Notes (Signed)
 Patient transported to CT

## 2024-06-23 NOTE — ED Provider Notes (Signed)
  Physical Exam  BP 132/72 (BP Location: Right Arm)   Pulse 94   Temp 98.8 F (37.1 C)   Resp 18   Ht 5' 3 (1.6 m)   Wt 84 kg   SpO2 97%   BMI 32.80 kg/m   Physical Exam  Procedures  Procedures  ED Course / MDM    Medical Decision Making Amount and/or Complexity of Data Reviewed Labs: ordered. Radiology: ordered.  Risk Prescription drug management.   68yo female with a history of ulcerative colitis who presents with concern for abdominal pain and diarrhea.  CT completed shows a long segment of colonic wall thickening and.  Colonic fat stranding extending from the splenic flexure flexure through the rectum compatible with inflammatory or infectious colitis, in the setting of her known ulcerative colitis feel it is related to this.

## 2024-07-10 ENCOUNTER — Encounter (HOSPITAL_COMMUNITY): Payer: Self-pay

## 2024-07-10 ENCOUNTER — Inpatient Hospital Stay (HOSPITAL_COMMUNITY)
Admission: EM | Admit: 2024-07-10 | Discharge: 2024-07-19 | DRG: 386 | Disposition: A | Discharge status: Home / Self Care | Attending: Internal Medicine | Admitting: Internal Medicine

## 2024-07-10 ENCOUNTER — Other Ambulatory Visit: Payer: Self-pay

## 2024-07-10 ENCOUNTER — Emergency Department (HOSPITAL_COMMUNITY)

## 2024-07-10 DIAGNOSIS — D75839 Thrombocytosis, unspecified: Secondary | ICD-10-CM | POA: Diagnosis not present

## 2024-07-10 DIAGNOSIS — Z6831 Body mass index (BMI) 31.0-31.9, adult: Secondary | ICD-10-CM

## 2024-07-10 DIAGNOSIS — I10 Essential (primary) hypertension: Secondary | ICD-10-CM | POA: Diagnosis present

## 2024-07-10 DIAGNOSIS — E8809 Other disorders of plasma-protein metabolism, not elsewhere classified: Secondary | ICD-10-CM | POA: Diagnosis present

## 2024-07-10 DIAGNOSIS — Z880 Allergy status to penicillin: Secondary | ICD-10-CM

## 2024-07-10 DIAGNOSIS — E559 Vitamin D deficiency, unspecified: Secondary | ICD-10-CM | POA: Diagnosis present

## 2024-07-10 DIAGNOSIS — Z8679 Personal history of other diseases of the circulatory system: Secondary | ICD-10-CM

## 2024-07-10 DIAGNOSIS — D72829 Elevated white blood cell count, unspecified: Secondary | ICD-10-CM | POA: Diagnosis present

## 2024-07-10 DIAGNOSIS — Z79899 Other long term (current) drug therapy: Secondary | ICD-10-CM

## 2024-07-10 DIAGNOSIS — N179 Acute kidney failure, unspecified: Secondary | ICD-10-CM | POA: Diagnosis present

## 2024-07-10 DIAGNOSIS — E871 Hypo-osmolality and hyponatremia: Secondary | ICD-10-CM | POA: Diagnosis present

## 2024-07-10 DIAGNOSIS — Z8249 Family history of ischemic heart disease and other diseases of the circulatory system: Secondary | ICD-10-CM

## 2024-07-10 DIAGNOSIS — K51511 Left sided colitis with rectal bleeding: Principal | ICD-10-CM | POA: Diagnosis present

## 2024-07-10 DIAGNOSIS — E785 Hyperlipidemia, unspecified: Secondary | ICD-10-CM | POA: Diagnosis present

## 2024-07-10 DIAGNOSIS — D509 Iron deficiency anemia, unspecified: Secondary | ICD-10-CM | POA: Diagnosis present

## 2024-07-10 DIAGNOSIS — E119 Type 2 diabetes mellitus without complications: Secondary | ICD-10-CM

## 2024-07-10 DIAGNOSIS — K529 Noninfective gastroenteritis and colitis, unspecified: Secondary | ICD-10-CM | POA: Diagnosis not present

## 2024-07-10 DIAGNOSIS — Z96651 Presence of right artificial knee joint: Secondary | ICD-10-CM | POA: Diagnosis present

## 2024-07-10 DIAGNOSIS — R7303 Prediabetes: Secondary | ICD-10-CM | POA: Diagnosis present

## 2024-07-10 DIAGNOSIS — E66811 Obesity, class 1: Secondary | ICD-10-CM | POA: Diagnosis present

## 2024-07-10 DIAGNOSIS — K219 Gastro-esophageal reflux disease without esophagitis: Secondary | ICD-10-CM | POA: Diagnosis present

## 2024-07-10 DIAGNOSIS — E1165 Type 2 diabetes mellitus with hyperglycemia: Secondary | ICD-10-CM | POA: Diagnosis present

## 2024-07-10 DIAGNOSIS — R1084 Generalized abdominal pain: Secondary | ICD-10-CM | POA: Diagnosis present

## 2024-07-10 DIAGNOSIS — K51911 Ulcerative colitis, unspecified with rectal bleeding: Secondary | ICD-10-CM | POA: Diagnosis present

## 2024-07-10 HISTORY — DX: Ulcerative colitis, unspecified, without complications: K51.90

## 2024-07-10 LAB — URINALYSIS, ROUTINE W REFLEX MICROSCOPIC
Bacteria, UA: NONE SEEN
Bilirubin Urine: NEGATIVE
Glucose, UA: NEGATIVE mg/dL
Ketones, ur: NEGATIVE mg/dL
Leukocytes,Ua: NEGATIVE
Nitrite: NEGATIVE
Protein, ur: NEGATIVE mg/dL
Specific Gravity, Urine: 1.005 (ref 1.005–1.030)
pH: 6 (ref 5.0–8.0)

## 2024-07-10 LAB — CBC
HCT: 31.6 % — ABNORMAL LOW (ref 36.0–46.0)
Hemoglobin: 9.8 g/dL — ABNORMAL LOW (ref 12.0–15.0)
MCH: 26.1 pg (ref 26.0–34.0)
MCHC: 31 g/dL (ref 30.0–36.0)
MCV: 84 fL (ref 80.0–100.0)
Platelets: 605 K/uL — ABNORMAL HIGH (ref 150–400)
RBC: 3.76 MIL/uL — ABNORMAL LOW (ref 3.87–5.11)
RDW: 14.6 % (ref 11.5–15.5)
WBC: 12 K/uL — ABNORMAL HIGH (ref 4.0–10.5)
nRBC: 0 % (ref 0.0–0.2)

## 2024-07-10 LAB — LIPASE, BLOOD: Lipase: 23 U/L (ref 11–51)

## 2024-07-10 LAB — COMPREHENSIVE METABOLIC PANEL WITH GFR
ALT: 18 U/L (ref 0–44)
AST: 13 U/L — ABNORMAL LOW (ref 15–41)
Albumin: 2.9 g/dL — ABNORMAL LOW (ref 3.5–5.0)
Alkaline Phosphatase: 78 U/L (ref 38–126)
Anion gap: 12 (ref 5–15)
BUN: 11 mg/dL (ref 8–23)
CO2: 25 mmol/L (ref 22–32)
Calcium: 9.2 mg/dL (ref 8.9–10.3)
Chloride: 96 mmol/L — ABNORMAL LOW (ref 98–111)
Creatinine, Ser: 0.97 mg/dL (ref 0.44–1.00)
GFR, Estimated: >60 mL/min (ref >60)
Glucose, Bld: 213 mg/dL — ABNORMAL HIGH (ref 70–99)
Potassium: 4.2 mmol/L (ref 3.5–5.1)
Sodium: 133 mmol/L — ABNORMAL LOW (ref 135–145)
Total Bilirubin: 0.4 mg/dL (ref 0.0–1.2)
Total Protein: 7.1 g/dL (ref 6.5–8.1)

## 2024-07-10 MED ORDER — HYDROMORPHONE HCL 1 MG/ML IJ SOLN
0.5000 mg | Freq: Once | INTRAMUSCULAR | Status: AC
  Administered 2024-07-10: 0.5 mg via INTRAVENOUS
  Filled 2024-07-10: qty 1

## 2024-07-10 MED ORDER — IOHEXOL 300 MG/ML  SOLN
100.0000 mL | Freq: Once | INTRAMUSCULAR | Status: AC | PRN
  Administered 2024-07-10: 100 mL via INTRAVENOUS

## 2024-07-10 MED ORDER — LACTATED RINGERS IV BOLUS
1000.0000 mL | Freq: Once | INTRAVENOUS | Status: AC
  Administered 2024-07-10: 1000 mL via INTRAVENOUS

## 2024-07-10 NOTE — ED Provider Notes (Signed)
 [facility]-EMERGENCY DEPT [facility] Emergency Department Provider Note MRN:  [id number]  Arrival date & time: 07/11/24     Chief Complaint   Abdominal Pain   History of Present Illness   Nancy Eaton is a 68 y.o. year-old female presents to the ED with chief complaint of abdominal pain, nausea, and vomiting.  Hx of ulcerative colitis.  Has been on steroids for about a month.  States that she saw Dr. Rollin on Monday, but continues to feel bad.  States that she can't get comfortable and can't get any relief.  States that she has been having some blood in her stools.  .  History provided by patient.   Review of Systems  Pertinent positive and negative review of systems noted in HPI.    Physical Exam   Vitals:   07/10/24 2048 07/10/24 2330  BP: (!) 146/91 (!) 145/72  Pulse: 79 78  Resp: 17 17  Temp: 98.3 F (36.8 C) 98.3 F (36.8 C)  SpO2: 100% 94%    CONSTITUTIONAL:  non toxic-appearing, NAD NEURO:  Alert and oriented x 3, CN 3-12 grossly intact EYES:  eyes equal and reactive ENT/NECK:  Supple, no stridor  CARDIO:  normal rate, regular rhythm, appears well-perfused  PULM:  No respiratory distress, CTAB GI/GU:  non-distended, no focal abdominal tenderness MSK/SPINE:  No gross deformities, no edema, moves all extremities  SKIN:  no rash, atraumatic   *Additional and/or pertinent findings included in MDM below  Diagnostic and Interventional Summary    EKG Interpretation Date/Time:    Ventricular Rate:    PR Interval:    QRS Duration:    QT Interval:    QTC Calculation:   R Axis:      Text Interpretation:         Labs Reviewed  COMPREHENSIVE METABOLIC PANEL WITH GFR - Abnormal; Notable for the following components:      Result Value   Sodium 133 (*)    Chloride 96 (*)    Glucose, Bld 213 (*)    Albumin 2.9 (*)    AST 13 (*)    All other components within normal limits  CBC - Abnormal; Notable for the following components:   WBC 12.0 (*)    RBC  3.76 (*)    Hemoglobin 9.8 (*)    HCT 31.6 (*)    Platelets 605 (*)    All other components within normal limits  URINALYSIS, ROUTINE W REFLEX MICROSCOPIC - Abnormal; Notable for the following components:   Color, Urine STRAW (*)    Hgb urine dipstick SMALL (*)    All other components within normal limits  LIPASE, BLOOD  CBC WITH DIFFERENTIAL/PLATELET  COMPREHENSIVE METABOLIC PANEL WITH GFR  MAGNESIUM   MAGNESIUM     CT ABDOMEN PELVIS W CONTRAST  Final Result      Medications  acetaminophen  (TYLENOL ) tablet 650 mg (has no administration in time range)    Or  acetaminophen  (TYLENOL ) suppository 650 mg (has no administration in time range)  melatonin tablet 3 mg (has no administration in time range)  ondansetron  (ZOFRAN ) injection 4 mg (has no administration in time range)  naloxone  (NARCAN ) injection 0.4 mg (has no administration in time range)  HYDROmorphone  (DILAUDID ) injection 0.5 mg (has no administration in time range)  cefTRIAXone  (ROCEPHIN ) 2 g in sodium chloride  0.9 % 100 mL IVPB (has no administration in time range)  metroNIDAZOLE  (FLAGYL ) IVPB 500 mg (has no administration in time range)  methylPREDNISolone  sodium succinate (SOLU-MEDROL ) 125 mg/2  mL injection 60 mg (has no administration in time range)  lactated ringers  bolus 1,000 mL (0 mLs Intravenous Stopped 07/11/24 0145)  HYDROmorphone  (DILAUDID ) injection 0.5 mg (0.5 mg Intravenous Given 07/10/24 2239)  iohexol  (OMNIPAQUE ) 300 MG/ML solution 100 mL (100 mLs Intravenous Contrast Given 07/10/24 2310)     Procedures  /  Critical Care Procedures  ED Course and Medical Decision Making  I have reviewed the triage vital signs, the nursing notes, and pertinent available records from the EMR.  Social Determinants Affecting Complexity of Care: Patient has no clinically significant social determinants affecting this chief complaint..   ED Course:    Medical Decision Making Patient here with history of ulcerative  colitis.  States that she has been having a flareup for the past month and is having trouble eating and drinking due to the pain.  Reports associated bloody diarrhea.  States that she has had associated nausea, vomiting, and abdominal cramping.  She has been on steroids.  She has been seen by GI.  She reports no improvement and continues to feel weaker and more rundown, so she came to the emergency department for evaluation and treatment.  Labs are notable for a mild leukocytosis of 12.0, no significant electrolyte abnormality.  Patient does appear a bit uncomfortable.  Will repeat CT to see if things have worsened.  CT again shows colitis and also increased stool burden.  Due to patient's persistent and ongoing symptoms, I think that she would be best served by being admitted to the hospital and possibly by GI consultation.  Amount and/or Complexity of Data Reviewed Labs: ordered. Radiology: ordered.  Risk Prescription drug management. Decision regarding hospitalization.         Consultants: I consulted with Hospitalist, Dr. Marcene, who is appreciated for admitting.   Treatment and Plan: Patient's exam and diagnostic results are concerning for colitis failing outpatient therapy.  Feel that patient will need admission to the hospital for further treatment and evaluation.    Final Clinical Impressions(s) / ED Diagnoses     ICD-10-CM   1. Colitis  K52.9       ED Discharge Orders     None         Discharge Instructions Discussed with and Provided to Patient:   Discharge Instructions   None      Vicky Charleston, PA-C 07/11/24 0148    Theadore Ozell HERO, MD 07/11/24 [phone]

## 2024-07-10 NOTE — ED Triage Notes (Signed)
 Colitis flare up that started a few months ago. Pt states she is having abdominal pain and nausea, vomiting. No relief with at home zofran . States she has bloody stools.

## 2024-07-11 ENCOUNTER — Encounter (HOSPITAL_COMMUNITY): Payer: Self-pay | Admitting: Internal Medicine

## 2024-07-11 DIAGNOSIS — I10 Essential (primary) hypertension: Secondary | ICD-10-CM | POA: Diagnosis present

## 2024-07-11 DIAGNOSIS — D509 Iron deficiency anemia, unspecified: Secondary | ICD-10-CM | POA: Diagnosis present

## 2024-07-11 DIAGNOSIS — Z6831 Body mass index (BMI) 31.0-31.9, adult: Secondary | ICD-10-CM | POA: Diagnosis not present

## 2024-07-11 DIAGNOSIS — R7303 Prediabetes: Secondary | ICD-10-CM | POA: Diagnosis not present

## 2024-07-11 DIAGNOSIS — Z8249 Family history of ischemic heart disease and other diseases of the circulatory system: Secondary | ICD-10-CM | POA: Diagnosis not present

## 2024-07-11 DIAGNOSIS — E8809 Other disorders of plasma-protein metabolism, not elsewhere classified: Secondary | ICD-10-CM | POA: Diagnosis present

## 2024-07-11 DIAGNOSIS — E66811 Obesity, class 1: Secondary | ICD-10-CM | POA: Diagnosis present

## 2024-07-11 DIAGNOSIS — E119 Type 2 diabetes mellitus without complications: Secondary | ICD-10-CM | POA: Diagnosis not present

## 2024-07-11 DIAGNOSIS — K529 Noninfective gastroenteritis and colitis, unspecified: Principal | ICD-10-CM | POA: Diagnosis present

## 2024-07-11 DIAGNOSIS — R1084 Generalized abdominal pain: Secondary | ICD-10-CM | POA: Diagnosis not present

## 2024-07-11 DIAGNOSIS — E559 Vitamin D deficiency, unspecified: Secondary | ICD-10-CM | POA: Diagnosis present

## 2024-07-11 DIAGNOSIS — Z880 Allergy status to penicillin: Secondary | ICD-10-CM | POA: Diagnosis not present

## 2024-07-11 DIAGNOSIS — D75839 Thrombocytosis, unspecified: Secondary | ICD-10-CM | POA: Diagnosis not present

## 2024-07-11 DIAGNOSIS — E871 Hypo-osmolality and hyponatremia: Secondary | ICD-10-CM | POA: Diagnosis present

## 2024-07-11 DIAGNOSIS — K51511 Left sided colitis with rectal bleeding: Secondary | ICD-10-CM | POA: Diagnosis present

## 2024-07-11 DIAGNOSIS — K219 Gastro-esophageal reflux disease without esophagitis: Secondary | ICD-10-CM | POA: Diagnosis present

## 2024-07-11 DIAGNOSIS — D72829 Elevated white blood cell count, unspecified: Secondary | ICD-10-CM | POA: Diagnosis present

## 2024-07-11 DIAGNOSIS — E782 Mixed hyperlipidemia: Secondary | ICD-10-CM

## 2024-07-11 DIAGNOSIS — E1165 Type 2 diabetes mellitus with hyperglycemia: Secondary | ICD-10-CM | POA: Diagnosis present

## 2024-07-11 DIAGNOSIS — Z96651 Presence of right artificial knee joint: Secondary | ICD-10-CM | POA: Diagnosis present

## 2024-07-11 DIAGNOSIS — Z79899 Other long term (current) drug therapy: Secondary | ICD-10-CM | POA: Diagnosis not present

## 2024-07-11 DIAGNOSIS — K51911 Ulcerative colitis, unspecified with rectal bleeding: Secondary | ICD-10-CM | POA: Diagnosis not present

## 2024-07-11 DIAGNOSIS — N179 Acute kidney failure, unspecified: Secondary | ICD-10-CM | POA: Diagnosis present

## 2024-07-11 DIAGNOSIS — Z8679 Personal history of other diseases of the circulatory system: Secondary | ICD-10-CM | POA: Diagnosis not present

## 2024-07-11 DIAGNOSIS — E785 Hyperlipidemia, unspecified: Secondary | ICD-10-CM | POA: Diagnosis present

## 2024-07-11 DIAGNOSIS — K51811 Other ulcerative colitis with rectal bleeding: Secondary | ICD-10-CM | POA: Diagnosis not present

## 2024-07-11 LAB — CBC WITH DIFFERENTIAL/PLATELET
Abs Granulocyte: 9 K/uL — ABNORMAL HIGH (ref 1.5–6.5)
Abs Immature Granulocytes: 0.08 K/uL — ABNORMAL HIGH (ref 0.00–0.07)
Basophils Absolute: 0.1 K/uL (ref 0.0–0.1)
Basophils Relative: 0 %
Eosinophils Absolute: 0 K/uL (ref 0.0–0.5)
Eosinophils Relative: 0 %
HCT: 28.9 % — ABNORMAL LOW (ref 36.0–46.0)
Hemoglobin: 9 g/dL — ABNORMAL LOW (ref 12.0–15.0)
Immature Granulocytes: 1 %
Lymphocytes Relative: 17 %
Lymphs Abs: 2.3 K/uL (ref 0.7–4.0)
MCH: 25.9 pg — ABNORMAL LOW (ref 26.0–34.0)
MCHC: 31.1 g/dL (ref 30.0–36.0)
MCV: 83 fL (ref 80.0–100.0)
Monocytes Absolute: 2.3 K/uL — ABNORMAL HIGH (ref 0.1–1.0)
Monocytes Relative: 17 %
Neutro Abs: 9 K/uL — ABNORMAL HIGH (ref 1.7–7.7)
Neutrophils Relative %: 65 %
Platelets: 529 K/uL — ABNORMAL HIGH (ref 150–400)
RBC: 3.48 MIL/uL — ABNORMAL LOW (ref 3.87–5.11)
RDW: 14.6 % (ref 11.5–15.5)
WBC: 13.7 K/uL — ABNORMAL HIGH (ref 4.0–10.5)
nRBC: 0 % (ref 0.0–0.2)

## 2024-07-11 LAB — COMPREHENSIVE METABOLIC PANEL WITH GFR
ALT: 13 U/L (ref 0–44)
AST: 12 U/L — ABNORMAL LOW (ref 15–41)
Albumin: 2.3 g/dL — ABNORMAL LOW (ref 3.5–5.0)
Alkaline Phosphatase: 60 U/L (ref 38–126)
Anion gap: 8 (ref 5–15)
BUN: 14 mg/dL (ref 8–23)
CO2: 26 mmol/L (ref 22–32)
Calcium: 8.6 mg/dL — ABNORMAL LOW (ref 8.9–10.3)
Chloride: 100 mmol/L (ref 98–111)
Creatinine, Ser: 0.71 mg/dL (ref 0.44–1.00)
GFR, Estimated: >60 mL/min (ref >60)
Glucose, Bld: 85 mg/dL (ref 70–99)
Potassium: 3.8 mmol/L (ref 3.5–5.1)
Sodium: 134 mmol/L — ABNORMAL LOW (ref 135–145)
Total Bilirubin: <0.2 mg/dL (ref 0.0–1.2)
Total Protein: 5.9 g/dL — ABNORMAL LOW (ref 6.5–8.1)

## 2024-07-11 LAB — CBG MONITORING, ED
Glucose-Capillary: 79 mg/dL (ref 70–99)
Glucose-Capillary: 118 mg/dL — ABNORMAL HIGH (ref 70–99)

## 2024-07-11 LAB — HEMOGLOBIN A1C
Hgb A1c MFr Bld: 6.7 % — ABNORMAL HIGH (ref 4.8–5.6)
Mean Plasma Glucose: 145.59 mg/dL

## 2024-07-11 LAB — CREATININE, URINE, RANDOM: Creatinine, Urine: 32 mg/dL

## 2024-07-11 LAB — GLUCOSE, CAPILLARY: Glucose-Capillary: 179 mg/dL — ABNORMAL HIGH (ref 70–99)

## 2024-07-11 LAB — SEDIMENTATION RATE: Sed Rate: 68 mm/h — ABNORMAL HIGH (ref 0–22)

## 2024-07-11 LAB — MAGNESIUM
Magnesium: 2.7 mg/dL — ABNORMAL HIGH (ref 1.7–2.4)
Magnesium: 2.3 mg/dL (ref 1.7–2.4)

## 2024-07-11 LAB — CK: Total CK: 15 U/L — ABNORMAL LOW (ref 38–234)

## 2024-07-11 LAB — SODIUM, URINE, RANDOM: Sodium, Ur: <10 mmol/L

## 2024-07-11 LAB — C-REACTIVE PROTEIN: CRP: 4.6 mg/dL — ABNORMAL HIGH (ref <1.0)

## 2024-07-11 MED ORDER — METHYLPREDNISOLONE SODIUM SUCC 125 MG IJ SOLR
60.0000 mg | Freq: Every day | INTRAMUSCULAR | Status: DC
  Administered 2024-07-11 – 2024-07-14 (×4): 60 mg via INTRAVENOUS
  Filled 2024-07-11 (×4): qty 2

## 2024-07-11 MED ORDER — NALOXONE HCL 0.4 MG/ML IJ SOLN: 0.4000 mg | INTRAMUSCULAR | Status: DC | PRN

## 2024-07-11 MED ORDER — HYDROMORPHONE HCL 1 MG/ML IJ SOLN
0.5000 mg | INTRAMUSCULAR | Status: DC | PRN
  Administered 2024-07-11 – 2024-07-18 (×14): 0.5 mg via INTRAVENOUS
  Filled 2024-07-11 (×3): qty 0.5
  Filled 2024-07-11: qty 1
  Filled 2024-07-11 (×10): qty 0.5

## 2024-07-11 MED ORDER — ATORVASTATIN CALCIUM 40 MG PO TABS
40.0000 mg | ORAL_TABLET | Freq: Every day | ORAL | Status: DC
  Administered 2024-07-11 – 2024-07-19 (×9): 40 mg via ORAL
  Filled 2024-07-11 (×9): qty 1

## 2024-07-11 MED ORDER — INSULIN ASPART 100 UNIT/ML IJ SOLN
0.0000 [IU] | Freq: Four times a day (QID) | INTRAMUSCULAR | Status: DC
  Administered 2024-07-11: 2 [IU] via SUBCUTANEOUS
  Administered 2024-07-12: 3 [IU] via SUBCUTANEOUS
  Administered 2024-07-12: 1 [IU] via SUBCUTANEOUS
  Administered 2024-07-13: 3 [IU] via SUBCUTANEOUS
  Administered 2024-07-14: 2 [IU] via SUBCUTANEOUS
  Administered 2024-07-14: 1 [IU] via SUBCUTANEOUS
  Administered 2024-07-15 – 2024-07-16 (×5): 2 [IU] via SUBCUTANEOUS
  Administered 2024-07-16: 1 [IU] via SUBCUTANEOUS
  Administered 2024-07-16: 2 [IU] via SUBCUTANEOUS
  Administered 2024-07-16 – 2024-07-17 (×2): 1 [IU] via SUBCUTANEOUS
  Administered 2024-07-17: 2 [IU] via SUBCUTANEOUS
  Filled 2024-07-11: qty 0.09

## 2024-07-11 MED ORDER — ACETAMINOPHEN 650 MG RE SUPP: 650.0000 mg | Freq: Four times a day (QID) | RECTAL | Status: DC | PRN

## 2024-07-11 MED ORDER — FERROUS SULFATE 325 (65 FE) MG PO TABS
325.0000 mg | ORAL_TABLET | ORAL | Status: DC
  Administered 2024-07-11 – 2024-07-13 (×2): 325 mg via ORAL
  Filled 2024-07-11 (×2): qty 1

## 2024-07-11 MED ORDER — METRONIDAZOLE 500 MG/100ML IV SOLN
500.0000 mg | Freq: Two times a day (BID) | INTRAVENOUS | Status: DC
  Administered 2024-07-11 – 2024-07-12 (×3): 500 mg via INTRAVENOUS
  Filled 2024-07-11 (×2): qty 100

## 2024-07-11 MED ORDER — ACETAMINOPHEN 325 MG PO TABS: 650.0000 mg | ORAL_TABLET | Freq: Four times a day (QID) | ORAL | Status: DC | PRN

## 2024-07-11 MED ORDER — SODIUM CHLORIDE 0.9 % IV SOLN
2.0000 g | INTRAVENOUS | Status: DC
  Administered 2024-07-11 – 2024-07-12 (×2): 2 g via INTRAVENOUS
  Filled 2024-07-11 (×2): qty 20

## 2024-07-11 MED ORDER — MELATONIN 3 MG PO TABS
3.0000 mg | ORAL_TABLET | Freq: Every evening | ORAL | Status: DC | PRN
  Administered 2024-07-17 – 2024-07-18 (×2): 3 mg via ORAL
  Filled 2024-07-11 (×3): qty 1

## 2024-07-11 MED ORDER — LACTATED RINGERS IV SOLN: INTRAVENOUS | Status: AC

## 2024-07-11 MED ORDER — ONDANSETRON HCL 4 MG/2ML IJ SOLN
4.0000 mg | Freq: Four times a day (QID) | INTRAMUSCULAR | Status: DC | PRN
  Administered 2024-07-14 – 2024-07-18 (×7): 4 mg via INTRAVENOUS
  Filled 2024-07-11 (×7): qty 2

## 2024-07-11 NOTE — Plan of Care (Signed)
  Problem: Clinical Measurements: Goal: Will remain free from infection Outcome: Progressing Goal: Cardiovascular complication will be avoided Outcome: Progressing   Problem: Activity: Goal: Risk for activity intolerance will decrease Outcome: Progressing   Problem: Elimination: Goal: Will not experience complications related to bowel motility Outcome: Progressing Goal: Will not experience complications related to urinary retention Outcome: Progressing   Problem: Skin Integrity: Goal: Risk for impaired skin integrity will decrease Outcome: Progressing

## 2024-07-11 NOTE — ED Notes (Signed)
 ED TO INPATIENT HANDOFF REPORT  Name/Age/Gender Nancy Eaton 68 y.o. female  Code Status    Code Status Orders  (From admission, onward)           Start     Ordered   07/11/24 0138  Full code  Continuous       Question:  By:  Answer:  Consent: discussion documented in EHR   07/11/24 0137           Code Status History     Date Active Date Inactive Code Status Order ID Comments User Context   02/14/2024 2313 02/20/2024 1541 Full Code [id number]  Tobie Jorie SAUNDERS, MD ED   02/01/2024 1607 02/05/2024 2330 Full Code [id number]  Zella Katha HERO, MD ED   07/17/2020 1254 07/18/2020 2140 Full Code [id number]  Vernetta Lonni GRADE, MD Inpatient       Home/SNF/Other Home  Chief Complaint Colitis [K52.9]  Level of Care/Admitting Diagnosis ED Disposition     ED Disposition  Admit   Condition  --   Comment  Hospital Area: [facility] [[id number]]  Level of Care: Telemetry [5]  Admit to tele based on following criteria: Monitor for Ischemic changes  May admit patient to [facility] or [facility] if equivalent level of care is available:: No  Covid Evaluation: Asymptomatic - no recent exposure (last 10 days) testing not required  Diagnosis: Colitis [807065]  Admitting Physician: HOWERTER, JUSTIN B [[id number]]  Attending Physician: SIRA, ZACKERY [[id number]]  Certification:: I certify this patient will need inpatient services for at least 2 midnights  Expected Medical Readiness: 07/12/2024          Medical History Past Medical History:  Diagnosis Date   Arthritis    Knee   Hyperlipidemia    Lovastatin  started approx 2011   IFG (impaired fasting glucose) 2018   HbA1c 5.9% (stable)   PONV (postoperative nausea and vomiting)    Pre-diabetes    Right knee pain 12/2019   End stage: TKA recommended by ortho 01/2020->pt considering   Sigmoid diverticulosis 04/2010                    UC (ulcerative colitis) (HCC)     Allergies Allergies   Allergen Reactions   Penicillins Rash    20 years   Did it involve swelling of the face/tongue/throat, SOB, or low BP? No Did it involve sudden or severe rash/hives, skin peeling, or any reaction on the inside of your mouth or nose? Yes Did you need to seek medical attention at a hospital or doctor's office? No When did it last happen? Over 20 Years       If all above answers are NO, may proceed with cephalosporin use.      IV Location/Drains/Wounds Patient Lines/Drains/Airways Status     Active Line/Drains/Airways     Name Placement date Placement time Site Days   Peripheral IV 07/10/24 1.16 Anterior;Right Forearm 07/10/24  2232  Forearm  1            Labs/Imaging Results for orders placed or performed during the hospital encounter of 07/10/24 (from the past 48 hours)  Sodium, urine, random     Status: None   Collection Time: 07/10/24  4:04 AM  Result Value Ref Range   Sodium, Ur <10 mmol/L    Comment: Performed at [facility], [address], [place], [place] [address]  Creatinine, urine, random     Status: None  Collection Time: 07/10/24  4:04 AM  Result Value Ref Range   Creatinine, Urine 32 mg/dL    Comment: Performed at [facility], [address], [place], [place] [address]  Lipase, blood     Status: None   Collection Time: 07/10/24  5:06 PM  Result Value Ref Range   Lipase 23 11 - 51 U/L    Comment: Performed at [facility], [address], [place], [place] [address]  Comprehensive metabolic panel     Status: Abnormal   Collection Time: 07/10/24  5:06 PM  Result Value Ref Range   Sodium 133 (L) 135 - 145 mmol/L   Potassium 4.2 3.5 - 5.1 mmol/L   Chloride 96 (L) 98 - 111 mmol/L   CO2 25 22 - 32 mmol/L   Glucose, Bld 213 (H) 70 - 99 mg/dL    Comment: Glucose reference range applies only to samples taken after fasting for at least 8 hours.   BUN 11 8 - 23 mg/dL   Creatinine, Ser 9.02 0.44 -  1.00 mg/dL   Calcium  9.2 8.9 - 10.3 mg/dL   Total Protein 7.1 6.5 - 8.1 g/dL   Albumin 2.9 (L) 3.5 - 5.0 g/dL   AST 13 (L) 15 - 41 U/L   ALT 18 0 - 44 U/L   Alkaline Phosphatase 78 38 - 126 U/L   Total Bilirubin 0.4 0.0 - 1.2 mg/dL   GFR, Estimated >39 >39 mL/min    Comment: (NOTE) Calculated using the CKD-EPI Creatinine Equation (2021)    Anion gap 12 5 - 15    Comment: Performed at [facility], [address] [place], [place] [address]  CBC     Status: Abnormal   Collection Time: 07/10/24  5:06 PM  Result Value Ref Range   WBC 12.0 (H) 4.0 - 10.5 K/uL   RBC 3.76 (L) 3.87 - 5.11 MIL/uL   Hemoglobin 9.8 (L) 12.0 - 15.0 g/dL   HCT 68.3 (L) 63.9 - 53.9 %   MCV 84.0 80.0 - 100.0 fL   MCH 26.1 26.0 - 34.0 pg   MCHC 31.0 30.0 - 36.0 g/dL   RDW 85.3 88.4 - 84.4 %   Platelets 605 (H) 150 - 400 K/uL   nRBC 0.0 0.0 - 0.2 %    Comment: Performed at [facility], [address] [place], [place] [address]  Magnesium      Status: Abnormal   Collection Time: 07/10/24  5:06 PM  Result Value Ref Range   Magnesium  2.7 (H) 1.7 - 2.4 mg/dL    Comment: Performed at [facility], [address] [place], [place] [address]  Hemoglobin A1c     Status: Abnormal   Collection Time: 07/10/24  5:06 PM  Result Value Ref Range   Hgb A1c MFr Bld 6.7 (H) 4.8 - 5.6 %    Comment: (NOTE) Diagnosis of Diabetes The following HbA1c ranges recommended by the American Diabetes Association (ADA) may be used as an aid in the diagnosis of diabetes mellitus.  Hemoglobin             Suggested A1C NGSP%              Diagnosis  <5.7                   Non Diabetic  5.7-6.4                Pre-Diabetic  >6.4  Diabetic  <7.0                   Glycemic control for                       adults with diabetes.     Mean Plasma Glucose 145.59 mg/dL    Comment: Performed at [facility], [address], [place], [place] [address]   Urinalysis, Routine w reflex microscopic -Urine, Clean Catch     Status: Abnormal   Collection Time: 07/10/24  5:15 PM  Result Value Ref Range   Color, Urine STRAW (A) YELLOW   APPearance CLEAR CLEAR   Specific Gravity, Urine 1.005 1.005 - 1.030   pH 6.0 5.0 - 8.0   Glucose, UA NEGATIVE NEGATIVE mg/dL   Hgb urine dipstick SMALL (A) NEGATIVE   Bilirubin Urine NEGATIVE NEGATIVE   Ketones, ur NEGATIVE NEGATIVE mg/dL   Protein, ur NEGATIVE NEGATIVE mg/dL   Nitrite NEGATIVE NEGATIVE   Leukocytes,Ua NEGATIVE NEGATIVE   RBC / HPF 0-5 0 - 5 RBC/hpf   WBC, UA 0-5 0 - 5 WBC/hpf   Bacteria, UA NONE SEEN NONE SEEN   Squamous Epithelial / HPF 0-5 0 - 5 /HPF    Comment: Performed at [facility], [address], [place], [place] [address]  CBG monitoring, ED     Status: None   Collection Time: 07/11/24  5:21 AM  Result Value Ref Range   Glucose-Capillary 79 70 - 99 mg/dL    Comment: Glucose reference range applies only to samples taken after fasting for at least 8 hours.  CBC with Differential/Platelet     Status: Abnormal   Collection Time: 07/11/24  5:24 AM  Result Value Ref Range   WBC 13.7 (H) 4.0 - 10.5 K/uL   RBC 3.48 (L) 3.87 - 5.11 MIL/uL   Hemoglobin 9.0 (L) 12.0 - 15.0 g/dL   HCT 71.0 (L) 63.9 - 53.9 %   MCV 83.0 80.0 - 100.0 fL   MCH 25.9 (L) 26.0 - 34.0 pg   MCHC 31.1 30.0 - 36.0 g/dL   RDW 85.3 88.4 - 84.4 %   Platelets 529 (H) 150 - 400 K/uL   nRBC 0.0 0.0 - 0.2 %   Neutrophils Relative % 65 %   Neutro Abs 9.0 (H) 1.7 - 7.7 K/uL   Lymphocytes Relative 17 %   Lymphs Abs 2.3 0.7 - 4.0 K/uL   Monocytes Relative 17 %   Monocytes Absolute 2.3 (H) 0.1 - 1.0 K/uL   Eosinophils Relative 0 %   Eosinophils Absolute 0.0 0.0 - 0.5 K/uL   Basophils Relative 0 %   Basophils Absolute 0.1 0.0 - 0.1 K/uL   Immature Granulocytes 1 %   Abs Immature Granulocytes 0.08 (H) 0.00 - 0.07 K/uL   Abs Granulocyte 9.0 (H) 1.5 - 6.5 K/uL   Reactive, Benign Lymphocytes PRESENT      Comment: Performed at [facility], [address] [place], [place] [address]  Comprehensive metabolic panel with GFR     Status: Abnormal   Collection Time: 07/11/24  5:24 AM  Result Value Ref Range   Sodium 134 (L) 135 - 145 mmol/L   Potassium 3.8 3.5 - 5.1 mmol/L   Chloride 100 98 - 111 mmol/L   CO2 26 22 - 32 mmol/L   Glucose, Bld 85 70 - 99 mg/dL    Comment: Glucose reference range applies only to samples taken  after fasting for at least 8 hours.   BUN 14 8 - 23 mg/dL   Creatinine, Ser 9.28 0.44 - 1.00 mg/dL   Calcium  8.6 (L) 8.9 - 10.3 mg/dL   Total Protein 5.9 (L) 6.5 - 8.1 g/dL   Albumin 2.3 (L) 3.5 - 5.0 g/dL   AST 12 (L) 15 - 41 U/L   ALT 13 0 - 44 U/L   Alkaline Phosphatase 60 38 - 126 U/L   Total Bilirubin <0.2 0.0 - 1.2 mg/dL   GFR, Estimated >39 >39 mL/min    Comment: (NOTE) Calculated using the CKD-EPI Creatinine Equation (2021)    Anion gap 8 5 - 15    Comment: Performed at [facility], [address] [place], [place] [address]  Magnesium      Status: None   Collection Time: 07/11/24  5:24 AM  Result Value Ref Range   Magnesium  2.3 1.7 - 2.4 mg/dL    Comment: Performed at [facility], [address] [place], [place] [address]  C-reactive protein     Status: Abnormal   Collection Time: 07/11/24  5:24 AM  Result Value Ref Range   CRP 4.6 (H) <1.0 mg/dL    Comment: Performed at [facility], [address], [place], [place] [address]  Sedimentation rate     Status: Abnormal   Collection Time: 07/11/24  5:24 AM  Result Value Ref Range   Sed Rate 68 (H) 0 - 22 mm/hr    Comment: Performed at [facility], [address], [place], [place] [address]  CK     Status: Abnormal   Collection Time: 07/11/24  5:24 AM  Result Value Ref Range   Total CK 15 (L) 38 - 234 U/L    Comment: Performed at [facility], [address] [place], [place] [address]  CBG  monitoring, ED     Status: Abnormal   Collection Time: 07/11/24 11:42 AM  Result Value Ref Range   Glucose-Capillary 118 (H) 70 - 99 mg/dL    Comment: Glucose reference range applies only to samples taken after fasting for at least 8 hours.   CT ABDOMEN PELVIS W CONTRAST Result Date: 07/10/2024 CLINICAL DATA:  Abdominal pain, nausea, vomiting EXAM: CT ABDOMEN AND PELVIS WITH CONTRAST TECHNIQUE: Multidetector CT imaging of the abdomen and pelvis was performed using the standard protocol following bolus administration of intravenous contrast. RADIATION DOSE REDUCTION: This exam was performed according to the departmental dose-optimization program which includes automated exposure control, adjustment of the mA and/or kV according to patient size and/or use of iterative reconstruction technique. CONTRAST:  OMNIPAQUE  IOHEXOL  300 MG/ML  SOLN COMPARISON:  06/23/2024 FINDINGS: Lower chest: No acute findings Hepatobiliary: Right lobe hemangioma again noted measuring up to 3.2 cm in greatest diameter, unchanged. No suspicious focal hepatic abnormality or new abnormality. Gallbladder unremarkable. Pancreas: No focal abnormality or ductal dilatation. Spleen: No focal abnormality.  Normal size. Adrenals/Urinary Tract: No adrenal abnormality. No focal renal abnormality. No stones or hydronephrosis. Urinary bladder is unremarkable. Stomach/Bowel: There is colonic wall thickening noted from the splenic flexure through the sigmoid colon, similar to prior study compatible with colitis. Large stool burden in the colon proximal to the colitis. This is increased since prior study. Vascular/Lymphatic: No evidence of aneurysm or adenopathy. Aortic atherosclerosis. Reproductive: Uterus and adnexa unremarkable.  No mass. Other: No free fluid or free air. Musculoskeletal: No acute bony abnormality. IMPRESSION: Continued abnormal colonic wall thickening from the  splenic flexure through the sigmoid colon compatible with infectious  or inflammatory colitis. This is similar to prior study. Large stool burden throughout the colon proximal to the colitis, increasing since prior study. Aortic atherosclerosis. Electronically Signed   By: Franky Crease M.D.   On: 07/10/2024 23:32    Pending Labs Unresulted Labs (From admission, onward)     Start     Ordered   07/12/24 0500  CBC  Daily,   R      07/11/24 1253   07/12/24 0500  Comprehensive metabolic panel with GFR  Daily,   R      07/11/24 1253   07/12/24 0500  Magnesium   Daily,   R      07/11/24 1253   07/12/24 0500  Phosphorus  Daily,   R      07/11/24 1253   07/12/24 0500  C-reactive protein  Daily,   R      07/11/24 1253   07/11/24 0553  Gastrointestinal Panel by PCR , Stool  (Gastrointestinal Panel by PCR, Stool                                                                                                                                                     **Does Not include CLOSTRIDIUM DIFFICILE testing. **If CDIFF testing is needed, place order from the C Difficile Testing order set.**)  Once,   R        07/11/24 0553            Vitals/Pain Today's Vitals   07/11/24 0530 07/11/24 0600 07/11/24 0659 07/11/24 1046  BP: 134/61 119/70  (!) 141/78  Pulse: (!) 58 (!) 58  73  Resp: 18 19  18   Temp:   98.8 F (37.1 C) 98.5 F (36.9 C)  TempSrc:   Oral Oral  SpO2: 95% 96%  98%  Weight:      Height:      PainSc:        Isolation Precautions Enteric precautions (UV disinfection)  Medications Medications  acetaminophen  (TYLENOL ) tablet 650 mg (has no administration in time range)    Or  acetaminophen  (TYLENOL ) suppository 650 mg (has no administration in time range)  melatonin tablet 3 mg (has no administration in time range)  ondansetron  (ZOFRAN ) injection 4 mg (has no administration in time range)  naloxone  (NARCAN ) injection 0.4 mg (has no administration in time range)  HYDROmorphone  (DILAUDID ) injection 0.5 mg (0.5 mg Intravenous Given 07/11/24  0357)  cefTRIAXone  (ROCEPHIN ) 2 g in sodium chloride  0.9 % 100 mL IVPB (2 g Intravenous New Bag/Given 07/11/24 0222)  metroNIDAZOLE  (FLAGYL ) IVPB 500 mg (500 mg Intravenous New Bag/Given 07/11/24 1313)  methylPREDNISolone  sodium succinate (SOLU-MEDROL ) 125 mg/2 mL injection 60 mg (60 mg Intravenous Given 07/11/24 0905)  lactated ringers  infusion ( Intravenous New Bag/Given 07/11/24 0444)  insulin   aspart (novoLOG ) injection 0-9 Units ( Subcutaneous Not Given 07/11/24 1145)  atorvastatin  (LIPITOR) tablet 40 mg (40 mg Oral Given 07/11/24 0905)  ferrous sulfate  tablet 325 mg (325 mg Oral Given 07/11/24 0905)  lactated ringers  bolus 1,000 mL (0 mLs Intravenous Stopped 07/11/24 0145)  HYDROmorphone  (DILAUDID ) injection 0.5 mg (0.5 mg Intravenous Given 07/10/24 2239)  iohexol  (OMNIPAQUE ) 300 MG/ML solution 100 mL (100 mLs Intravenous Contrast Given 07/10/24 2310)    Mobility walks

## 2024-07-11 NOTE — H&P (Signed)
 History and Physical      Nancy Eaton FMW:[id number] DOB: [birth year] DOA: 07/10/2024; DOS: 07/11/2024  PCP: Christopher Arland PARAS, PA-C  Patient coming from: home   I have personally briefly reviewed patient's old medical records in [facility]  Chief Complaint: Abdominal pain  HPI: Nancy Eaton is a 68 y.o. female with medical history significant for ulcerative colitis, prediabetes, essential pretension, hyperlipidemia, chronic iron deficiency anemia with baseline hemoglobin 9-12, who is admitted to [facility] on 07/10/2024 with persistent colitis after presenting from home to [facility] ED complaining of abdominal pain.   The patient presents approximately 3 weeks of persistent generalized sharp nonradiating abdominal discomfort, worse with palpation.  This has been associated with intermittent loose stool, with intermittent streaks of bright red blood in the absence of any associated melena.  Denies any associated hematemesis.  She is also noted recurrent nausea/vomiting over that timeframe denies any associated subjective fever, chills, rigors, or generalized myalgias.  Not associate with any dysuria or gross hematuria.  She acknowledges a history of ulcerative colitis, for which she follows with Dr. Rollin as her outpatient gastroenterologist.  She had presented with the above complaints to the emergency department on 06/23/2024, at which time CT scan was suggestive of colitis, infectious versus inflammatory.  At that time, C. difficile was found to be negative, while GI panel by PCR was also negative.  She was started on steroids, discharged to home from the emergency department, and subsequently followed up with outpatient gastroenterology.  However, and spite of outpatient use of systemic corticosteroids, she notes no significant improvement in her abdominal discomfort, report recurrent nausea/vomiting limiting her ability to tolerate p.o., including additional oral steroids at  this time.     ED Course:  Vital signs in the ED were notable for the following: Afebrile; heart rate in the 60s to 77.  Blood pressures in the 120s to 140s; respiratory rate 16-17, oxygen saturation 98 to 100% room air.  Labs were notable for the following: CMP was notable for the following: Sodium 133, which corrects to approximately 135 after adjustment for concomitant hyperglycemia, potassium 4.2, bicarbonate 25, anion gap 12, creatinine 0.97 compared to 0.63 on 06/23/2024, glucose 213, liver enzymes were within normal limits.  Lipase 23.  CBC notable for white blood cell count 12,000 compared to most recent prior will blood cell count of 16,400 on 06/23/2024, hemoglobin 9.8 compared to 11.3 on 06/23/2024, and associated with normocytic/Norocarp properties as well as nonelevated RDW, platelet count 65 compared to 487 on 06/23/2024.  Urinalysis showed no white blood cells, leukocyte esterase/nitrate negative, as well as small hemoglobin in the absence of any RBCs.  Per my interpretation, EKG in ED demonstrated the following: No EKG performed in the ED today.  Imaging in the ED, per corresponding formal radiology read, was notable for the following: CT abdomen/pelvis with contrast, in comparison to CT abdomen/pelvis with contrast performed on 06/23/2024, shows continued colonic wall thickening from the splenic flexure through the sigmoid colon consistent with infectious versus inflammatory colitis, similar to most recent prior CT abdomen/pelvis, will demonstrating no evidence of associated abscess, bowel striction, or perforation.  While in the ED, the following were administered: Dilaudid  0.5 mg IV x 1, lactated Ringer 's x 1 L bolus.  Subsequently, the patient was admitted for further evaluation management of persistent/refractory colitis, with presenting labs also notable for acute kidney injury.    Review of Systems: As per HPI otherwise 10 point review of systems negative.  Past Medical History:   Diagnosis Date   Arthritis    Knee   Hyperlipidemia    Lovastatin  started approx 2011   IFG (impaired fasting glucose) 2018   HbA1c 5.9% (stable)   PONV (postoperative nausea and vomiting)    Pre-diabetes    Right knee pain 12/2019   End stage: TKA recommended by ortho 01/2020->pt considering   Sigmoid diverticulosis 04/2010                     Past Surgical History:  Procedure Laterality Date   BREAST ENHANCEMENT SURGERY     As of mammogram 12/2017--rupture of implants stable.   CARPAL TUNNEL RELEASE     CESAREAN SECTION     COLONOSCOPY  age 29   [facility]--recall 10 yrs   FLEXIBLE SIGMOIDOSCOPY N/A 02/02/2024   Procedure: KINGSTON SIDE;  Surgeon: Rollin Dover, MD;  Location: [facility];  Service: Gastroenterology;  Laterality: N/A;   TOTAL KNEE ARTHROPLASTY Right 07/17/2020   Procedure: RIGHT TOTAL KNEE ARTHROPLASTY;  Surgeon: Vernetta Lonni GRADE, MD;  Location: [facility];  Service: Orthopedics;  Laterality: Right;    Social History:  reports that she has never smoked. She has never used smokeless tobacco. She reports current alcohol use. She reports that she does not use drugs.   Allergies  Allergen Reactions   Penicillins Rash    20 years   Did it involve swelling of the face/tongue/throat, SOB, or low BP? No Did it involve sudden or severe rash/hives, skin peeling, or any reaction on the inside of your mouth or nose? Yes Did you need to seek medical attention at a hospital or doctor's office? No When did it last happen? Over 20 Years       If all above answers are NO, may proceed with cephalosporin use.      Family History  Problem Relation Age of Onset   Cancer Mother        Brain   Alcohol abuse Father    COPD Brother    ADD / ADHD Son    Anxiety disorder Son    Heart disease Brother     Family history reviewed and not pertinent    Prior to Admission medications   Medication Sig Start Date End Date Taking? Authorizing Provider   Ascorbic Acid (C EXTRA STRENGTH) 1000 MG TABS Take 1 tablet by mouth every other day. 06/04/24  Yes [provider]  azithromycin  (ZITHROMAX ) 250 MG tablet Take 250 mg by mouth as directed. 06/07/24  Yes [provider]  Cholecalciferol 1.25 MG (50000 UT) capsule Take 50,000 Units by mouth once a week. 06/04/24 09/02/24 Yes [provider]  clindamycin  (CLEOCIN ) 300 MG capsule Take 300 mg by mouth 3 (three) times daily. 06/18/24  Yes [provider]  FEROSUL 325 (65 Fe) MG tablet Take 325 mg by mouth every other day. 06/04/24  Yes [provider]  acetaminophen  (TYLENOL ) 500 MG tablet Take 500 mg by mouth every 6 (six) hours as needed for moderate pain (pain score 4-6).    [provider]  atorvastatin  (LIPITOR) 40 MG tablet Take 1 tablet (40 mg total) by mouth daily. 02/25/19   Eaton, Aleene DEL, MD  CALCIUM -VITAMIN D PO Take 2 tablets by mouth daily.    [provider]  Glucosamine HCl-MSM (GLUCOSAMINE-MSM PO) Take 1 tablet by mouth daily.    [provider]  losartan  (COZAAR ) 50 MG tablet Take 50 mg by mouth daily. 01/03/24  [provider]  mesalamine  (CANASA ) 1000 MG suppository Place 1,000 mg rectally at bedtime. 02/07/24   [provider]  mesalamine  (LIALDA ) 1.2 g EC tablet Take 2.4 g by mouth daily. 02/07/24   [provider]  Multiple Vitamins-Minerals (MULTIVITAMIN WITH MINERALS) tablet Take 1 tablet by mouth daily. Women 50+    [provider]  Multiple Vitamins-Minerals (PRESERVISION AREDS 2) CAPS Take 1 tablet by mouth daily.    [provider]  nystatin (MYCOSTATIN) 100000 UNIT/ML suspension Take 4 mLs by mouth 4 (four) times daily. 02/08/24   [provider]  Omega-3 Fatty Acids (FISH OIL PO) Take 1,400 mg by mouth daily. Omega3 980 mg    [provider]  ondansetron  (ZOFRAN -ODT) 4 MG disintegrating tablet Take 1 tablet (4 mg total) by mouth every 8 (eight)  hours as needed for nausea or vomiting. 06/23/24   Dreama Longs, MD  oxyCODONE  (ROXICODONE ) 5 MG immediate release tablet Take 1 tablet (5 mg total) by mouth every 4 (four) hours as needed for severe pain (pain score 7-10). 06/23/24   Dreama Longs, MD  Turmeric 500 MG CAPS Take 1,500 mg by mouth in the morning, at noon, and at bedtime.     [provider]     Objective    Physical Exam: Vitals:   07/10/24 1645 07/10/24 1647 07/10/24 2048 07/10/24 2330  BP: 120/71  (!) 146/91 (!) 145/72  Pulse: 65  79 78  Resp: 16  17 17   Temp:   98.3 F (36.8 C) 98.3 F (36.8 C)  TempSrc:    Oral  SpO2: 98%  100% 94%  Weight:  84 kg    Height:  5' 3 (1.6 m)      General: appears to be stated age; alert, oriented Skin: warm, dry, no rash Head:  AT/[place] Mouth:  Oral mucosa membranes appear dry, normal dentition Neck: supple; trachea midline Heart:  RRR; did not appreciate any M/R/G Lungs: CTAB, did not appreciate any wheezes, rales, or rhonchi Abdomen: + BS; soft, ND, generalized tenderness to palpation, in the absence of any associated guarding, rigidity, or rebound tenderness Vascular: 2+ pedal pulses b/l; 2+ radial pulses b/l Extremities: no peripheral edema, no muscle wasting   Labs on Admission: I have personally reviewed following labs and imaging studies  CBC: Recent Labs  Lab 07/10/24 1706  WBC 12.0*  HGB 9.8*  HCT 31.6*  MCV 84.0  PLT 605*   Basic Metabolic Panel: Recent Labs  Lab 07/10/24 1706  NA 133*  K 4.2  CL 96*  CO2 25  GLUCOSE 213*  BUN 11  CREATININE 0.97  CALCIUM  9.2   GFR: Estimated Creatinine Clearance: 56.2 mL/min (by C-G formula based on SCr of 0.97 mg/dL). Liver Function Tests: Recent Labs  Lab 07/10/24 1706  AST 13*  ALT 18  ALKPHOS 78  BILITOT 0.4  PROT 7.1  ALBUMIN 2.9*   Recent Labs  Lab 07/10/24 1706  LIPASE 23   No results for input(s): AMMONIA in the last 168 hours. Coagulation Profile: No results for input(s):  INR, PROTIME in the last 168 hours. Cardiac Enzymes: No results for input(s): CKTOTAL, CKMB, CKMBINDEX, TROPONINI in the last 168 hours. BNP (last 3 results) No results for input(s): PROBNP in the last 8760 hours. HbA1C: No results for input(s): HGBA1C in the last 72 hours. CBG: No results for input(s): GLUCAP in the last 168 hours. Lipid Profile: No results for input(s): CHOL, HDL, LDLCALC, TRIG, CHOLHDL, LDLDIRECT in the last  72 hours. Thyroid Function Tests: No results for input(s): TSH, T4TOTAL, FREET4, T3FREE, THYROIDAB in the last 72 hours. Anemia Panel: No results for input(s): VITAMINB12, FOLATE, FERRITIN, TIBC, IRON, RETICCTPCT in the last 72 hours. Urine analysis:    Component Value Date/Time   COLORURINE STRAW (A) 07/10/2024 1715   APPEARANCEUR CLEAR 07/10/2024 1715   LABSPEC 1.005 07/10/2024 1715   PHURINE 6.0 07/10/2024 1715   GLUCOSEU NEGATIVE 07/10/2024 1715   HGBUR SMALL (A) 07/10/2024 1715   BILIRUBINUR NEGATIVE 07/10/2024 1715   KETONESUR NEGATIVE 07/10/2024 1715   PROTEINUR NEGATIVE 07/10/2024 1715   NITRITE NEGATIVE 07/10/2024 1715   LEUKOCYTESUR NEGATIVE 07/10/2024 1715    Radiological Exams on Admission: CT ABDOMEN PELVIS W CONTRAST Result Date: 07/10/2024 CLINICAL DATA:  Abdominal pain, nausea, vomiting EXAM: CT ABDOMEN AND PELVIS WITH CONTRAST TECHNIQUE: Multidetector CT imaging of the abdomen and pelvis was performed using the standard protocol following bolus administration of intravenous contrast. RADIATION DOSE REDUCTION: This exam was performed according to the departmental dose-optimization program which includes automated exposure control, adjustment of the mA and/or kV according to patient size and/or use of iterative reconstruction technique. CONTRAST:  OMNIPAQUE  IOHEXOL  300 MG/ML  SOLN COMPARISON:  06/23/2024 FINDINGS: Lower chest: No acute findings Hepatobiliary: Right lobe hemangioma again  noted measuring up to 3.2 cm in greatest diameter, unchanged. No suspicious focal hepatic abnormality or new abnormality. Gallbladder unremarkable. Pancreas: No focal abnormality or ductal dilatation. Spleen: No focal abnormality.  Normal size. Adrenals/Urinary Tract: No adrenal abnormality. No focal renal abnormality. No stones or hydronephrosis. Urinary bladder is unremarkable. Stomach/Bowel: There is colonic wall thickening noted from the splenic flexure through the sigmoid colon, similar to prior study compatible with colitis. Large stool burden in the colon proximal to the colitis. This is increased since prior study. Vascular/Lymphatic: No evidence of aneurysm or adenopathy. Aortic atherosclerosis. Reproductive: Uterus and adnexa unremarkable.  No mass. Other: No free fluid or free air. Musculoskeletal: No acute bony abnormality. IMPRESSION: Continued abnormal colonic wall thickening from the splenic flexure through the sigmoid colon compatible with infectious or inflammatory colitis. This is similar to prior study. Large stool burden throughout the colon proximal to the colitis, increasing since prior study. Aortic atherosclerosis. Electronically Signed   By: Franky Crease M.D.   On: 07/10/2024 23:32      Assessment/Plan    Principal Problem:   Colitis Active Problems:   HLD (hyperlipidemia)   Generalized abdominal pain   Leukocytosis   AKI (acute kidney injury) (HCC)   Prediabetes   History of essential hypertension   Chronic iron deficiency anemia    #) Colitis: 3 weeks of persistent generalized abdominal discomfort, with today's CT abdomen/pelvis with contrast showing colonic wall thickening from the splenic flexure through the sigmoid colon.  Presentation appears suggestive of persistent colitis involving the descending colon as well as sigmoid colon, which appears to be refractory to interval systemic corticosteroids, although patient's limited ability to tolerate p.o. over the last 2  to 3 weeks has complicated her ability to ensure absorption of systemic corticosteroids over that timeframe.  This is in the context of a documented history of ulcerative colitis, with radiology conveying that appearance of the patient's colitis on CT imaging appears most consistent with infectious versus inflammatory etiologies.   As there is question of degree of absorption of recent outpatient oral corticosteroids in the context of her recurrent nausea/vomiting, will initiate solumedrol at this time.  Additionally, given the refractory nature of her colitis and spite of  attempts at outpatient oral systemic corticosteroids, will also initiate IV antibiotics and monitor for evidence of infectious colitis, as further outlined below.  No evidence of acute peritoneal findings on physical exam.  Clinically, presentation appears less suggestive of ischemic colitis.  Of note, the patient has mild leukocytosis, this appears to be trending down from most recent prior white blood cell count.  Seedings presentation is not associate with any additional SIRS criteria.  Consequently, SIRS criteria are not met for sepsis at this time.  Consequently, blood cultures have not been obtained nor has lactic acid level been obtained at this time.  There is no evidence of complicating factors identified on today CT Abdo/pelvis, including no evidence of colonic abscess, obstruction, perforation or any evidence of fistula.  Plan: Lactated Ringer 's at 64 cc/h x 12 hours.  Prn IV Dilaudid .  As needed IV Zofran .  Initiate Rocephin  plus IV Flagyl .  Solu-Medrol  60 mg IV daily.  Check ESR, CRP.  Check GI panel by PCR.  Repeat CMP, CBC in the morning.                     #) Acute Kidney Injury:   Presenting creatinine 0.97 compared to most recent prior value of 0.63 on 06/23/2024.  Suspected prerenal contribution from intravascular depletion as a result of dehydration from patient's report of interval decrease in oral  intake as well as interval increase in GI losses in the form of recurrent nausea/vomiting and as well as intermittent loose stools in the setting of her presenting colitis.  Presenting urinalysis shows small hemoglobin in the absence of any RBCs.  Will add on CPK level to evaluate for any contributory rhabdomyolysis.  There may also be a contribution from outpatient losartan , which will be held for now.  Of note, CT abdomen/pelvis performed this evening showed no evidence of postrenal obstructive source.  Plan: monitor strict I's & O's and daily weights. Attempt to avoid nephrotoxic agents.  Hold home losartan  for now.  Refrain from NSAIDs. Repeat CMP in the morning. Check serum magnesium  level. Add-on random urine sodium and random urine creatinine.  Check CPK level.  Lactated Ringer 's, as above.                    #) Prediabetes: documented history of such, with most recent hemoglobin A1c level 6.4% consistent with diagnosis of prediabetes, with this most recent prior hemoglobin A1c checked on 02/16/2024.  Interval prescription for outpatient systemic corticosteroids, will repeat hemoglobin A1c level at this time.  She is not on any outpatient insulin  or oral hypoglycemic agents.  Presenting blood sugar 213.  Plan: accuchecks QAC and HS with low dose SSI.  Add on hemoglobin A1c level.                      #) Hyperlipidemia: documented h/o such. On atorvastatin  as outpatient.   Plan: continue home statin.  Follow-up for result of CPK level.                     #) Essential Hypertension: documented h/o such, with outpatient antihypertensive regimen including losartan .  SBP's in the ED today: 120s to 140s mmHg. in the setting of presenting acute kidney injury, will hold home losartan  for now.  Plan: Close monitoring of subsequent BP via routine VS. hold home losartan  for now, as above.                     #)  Chronic iron  deficiency anemia: Documented history of such in the setting of ulcerative colitis, a/w with baseline hgb range 9-12, with presenting hgb consistent with this range.  In the context of her 3 weeks of colitis, she has noted some mild intermittent streaks of bright red blood in her stool, which appears consistent with her presenting infectious versus inflammatory colitis.  No evidence of melena nor additional evidence to suggest acute upper gastrointestinal source.  She appears hemodynamically stable.  No evidence of contrast extravasation on this evening CT abdomen/pelvis with IV contrast.  On oral iron supplementation at home, in the absence of any outpatient use of blood thinners.   Plan: Repeat CBC in the morning.  Continue outpatient oral iron supplementation.  Further evaluation management of presenting persistent colitis, as above.        DVT prophylaxis: SCD's   Code Status: Full code Family Communication: none Disposition Plan: Per Rounding Team Consults called: none;  Admission status: Observation    I SPENT GREATER THAN 75  MINUTES IN CLINICAL CARE TIME/MEDICAL DECISION-MAKING IN COMPLETING THIS ADMISSION.     Jernard Reiber B Orva Gwaltney DO [facility] From 7PM - 7AM   07/11/2024, 1:39 AM

## 2024-07-12 DIAGNOSIS — K529 Noninfective gastroenteritis and colitis, unspecified: Secondary | ICD-10-CM | POA: Diagnosis not present

## 2024-07-12 LAB — COMPREHENSIVE METABOLIC PANEL WITH GFR
ALT: 14 U/L (ref 0–44)
AST: 8 U/L — ABNORMAL LOW (ref 15–41)
Albumin: 2.2 g/dL — ABNORMAL LOW (ref 3.5–5.0)
Alkaline Phosphatase: 62 U/L (ref 38–126)
Anion gap: 8 (ref 5–15)
BUN: 12 mg/dL (ref 8–23)
CO2: 27 mmol/L (ref 22–32)
Calcium: 8.7 mg/dL — ABNORMAL LOW (ref 8.9–10.3)
Chloride: 100 mmol/L (ref 98–111)
Creatinine, Ser: 0.7 mg/dL (ref 0.44–1.00)
GFR, Estimated: >60 mL/min (ref >60)
Glucose, Bld: 82 mg/dL (ref 70–99)
Potassium: 3.4 mmol/L — ABNORMAL LOW (ref 3.5–5.1)
Sodium: 135 mmol/L (ref 135–145)
Total Bilirubin: 0.2 mg/dL (ref 0.0–1.2)
Total Protein: 5.7 g/dL — ABNORMAL LOW (ref 6.5–8.1)

## 2024-07-12 LAB — CBC
HCT: 29.3 % — ABNORMAL LOW (ref 36.0–46.0)
Hemoglobin: 8.9 g/dL — ABNORMAL LOW (ref 12.0–15.0)
MCH: 25.3 pg — ABNORMAL LOW (ref 26.0–34.0)
MCHC: 30.4 g/dL (ref 30.0–36.0)
MCV: 83.2 fL (ref 80.0–100.0)
Platelets: 535 K/uL — ABNORMAL HIGH (ref 150–400)
RBC: 3.52 MIL/uL — ABNORMAL LOW (ref 3.87–5.11)
RDW: 14.7 % (ref 11.5–15.5)
WBC: 14.8 K/uL — ABNORMAL HIGH (ref 4.0–10.5)
nRBC: 0 % (ref 0.0–0.2)

## 2024-07-12 LAB — GLUCOSE, CAPILLARY
Glucose-Capillary: 106 mg/dL — ABNORMAL HIGH (ref 70–99)
Glucose-Capillary: 126 mg/dL — ABNORMAL HIGH (ref 70–99)
Glucose-Capillary: 249 mg/dL — ABNORMAL HIGH (ref 70–99)

## 2024-07-12 LAB — C-REACTIVE PROTEIN: CRP: 4.1 mg/dL — ABNORMAL HIGH (ref <1.0)

## 2024-07-12 LAB — PHOSPHORUS: Phosphorus: 2.5 mg/dL (ref 2.5–4.6)

## 2024-07-12 LAB — MAGNESIUM: Magnesium: 2.2 mg/dL (ref 1.7–2.4)

## 2024-07-12 MED ORDER — SODIUM CHLORIDE 0.9 % IV SOLN
1.0000 g | Freq: Three times a day (TID) | INTRAVENOUS | Status: DC
  Administered 2024-07-12 – 2024-07-14 (×6): 1 g via INTRAVENOUS
  Filled 2024-07-12 (×7): qty 20

## 2024-07-12 MED ORDER — POTASSIUM CHLORIDE 20 MEQ PO PACK
40.0000 meq | PACK | Freq: Once | ORAL | Status: AC
  Administered 2024-07-12: 40 meq via ORAL
  Filled 2024-07-12: qty 2

## 2024-07-12 NOTE — Progress Notes (Signed)
 As per MD Sira,Zackery Tele is discontinued.

## 2024-07-12 NOTE — Progress Notes (Signed)
  Progress Note   Patient: Nancy Eaton FMW:[id number] DOB: [birth year] DOA: 07/10/2024     1 DOS: the patient was seen and examined on 07/12/2024   Brief hospital course: 68 yo F admitted for colitis. WBC has been rising on ceftriaxone  and flagyl  (possible it is due to the IV steroids, however, CRP also slightly elevated at 4.1 - 4.6). Pt did not report feeling much better with these 2 antibx. Thus, IV meropenem  was started on 07/12/2024. GI panel in progress as of 11:31 AM 07/12/2024.  Assessment and Plan: Colitis  - IV meropenem  1 g q8hr  - IV zofran  4 mg q6 hr PRN  - IV solumedrol 60 mg daily  - Clear liquid diet  - GI panel in progress - IV dilaudid  0.5 mg q2 hr PRN  DM - A1c is 6.7 - Novolog  SS q6 hr   HLD - Lipitor 40 mg PO daily   HTN - Monitor   Chronic iron deficiency anemia  - Ferrous sulfate  325 mg PO every other day   Subjective: Pt seen and examined at the bedside. IV anitbx changed from ceftriaxone /flagyl  to IV meropenem  as pt reports not much improvement with ceftriaxone /flagyl . She also has a penicillin allergy. Monitor WBC and CRP levels with the addition of meropenem  (continue with IV steroids).  Physical Exam: Vitals:   07/11/24 1836 07/11/24 2202 07/12/24 0427 07/12/24 1117  BP: (!) 145/77 112/63 128/71 (!) 140/70  Pulse: 75 61 66 74  Resp: 18 18 17 17   Temp: 97.7 F (36.5 C) 98.6 F (37 C) 97.9 F (36.6 C) 98.1 F (36.7 C)  TempSrc:  Oral Oral Oral  SpO2: 100% 97% 98% 96%  Weight:      Height:       Physical Exam HENT:     Head: Normocephalic.  Cardiovascular:     Rate and Rhythm: Normal rate.  Pulmonary:     Effort: Pulmonary effort is normal.  Abdominal:     Tenderness: There is abdominal tenderness.  Skin:    General: Skin is warm.  Neurological:     Mental Status: She is alert and oriented to person, place, and time.  Psychiatric:        Mood and Affect: Mood normal.       Disposition: Status is: Inpatient Remains  inpatient appropriate because: IV antibx and serial labs   Planned Discharge Destination: Barriers to discharge: As above     Time spent: 35 minutes  Author: Lawanna Cecere , MD 07/12/2024 1:56 PM  For on call review [email or web address].

## 2024-07-12 NOTE — TOC Initial Note (Signed)
 Transition of Care [facility]) - Initial/Assessment Note    Patient Details  Name: Nancy Eaton MRN: [id number] Date of Birth: May 26, 1956  [facility] CM/SW Contact:    Bascom Service, RN Phone Number: 07/12/2024, 11:54 AM  Clinical Narrative: d/c plan home.                  Expected Discharge Plan: Home/Self Care Barriers to Discharge: Continued Medical Work up   Patient Goals and CMS Choice Patient states their goals for this hospitalization and ongoing recovery are:: Home CMS Medicare.gov Compare Post Acute Care list provided to:: Patient Choice offered to / list presented to : Patient [place] ownership interest in [facility].provided to:: Patient    Expected Discharge Plan and Services   Discharge Planning Services: CM Consult   Living arrangements for the past 2 months: Single Family Home                                      Prior Living Arrangements/Services Living arrangements for the past 2 months: Single Family Home                     Activities of Daily Living      Permission Sought/Granted                  Emotional Assessment              Admission diagnosis:  Colitis [K52.9] Patient Active Problem List   Diagnosis Date Noted   Colitis 07/11/2024   Generalized abdominal pain 07/11/2024   Leukocytosis 07/11/2024   AKI (acute kidney injury) (HCC) 07/11/2024   Prediabetes 07/11/2024   History of essential hypertension 07/11/2024   Chronic iron deficiency anemia 07/11/2024   Ulcerative colitis (HCC) 02/15/2024   Ulcerative colitis, left sided, with rectal bleeding (HCC) 02/14/2024   Hepatic lesion 02/14/2024   Hypokalemia 02/14/2024   Constipation 02/01/2024   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 [place] [address] Phone: [phone] Fax: [phone]  [id number] [place], [place] - [address] AT [facility] [address] [place] [address] Phone: [phone] Fax: [phone]     Social Drivers of Health (SDOH) Social History: SDOH Screenings   Food Insecurity: No Food Insecurity (07/12/2024)  Housing: Unknown (07/12/2024)  Transportation Needs: No Transportation Needs (07/12/2024)  Utilities: Not At Risk (07/12/2024)  Social Connections: Unknown (07/12/2024)  Tobacco Use: Low Risk  (07/11/2024)   SDOH Interventions:     Readmission Risk Interventions     No data to display

## 2024-07-12 NOTE — Plan of Care (Signed)
  Problem: Education: Goal: Ability to describe self-care measures that may prevent or decrease complications (Diabetes Survival Skills Education) will improve Outcome: Progressing   Problem: Fluid Volume: Goal: Ability to maintain a balanced intake and output will improve Outcome: Progressing   Problem: Metabolic: Goal: Ability to maintain appropriate glucose levels will improve Outcome: Progressing   Problem: Clinical Measurements: Goal: Will remain free from infection Outcome: Progressing Goal: Diagnostic test results will improve Outcome: Progressing   Problem: Nutrition: Goal: Adequate nutrition will be maintained Outcome: Progressing   Problem: Elimination: Goal: Will not experience complications related to bowel motility Outcome: Progressing   Problem: Pain Managment: Goal: General experience of comfort will improve and/or be controlled Outcome: Progressing

## 2024-07-13 DIAGNOSIS — Z8679 Personal history of other diseases of the circulatory system: Secondary | ICD-10-CM | POA: Diagnosis not present

## 2024-07-13 DIAGNOSIS — N179 Acute kidney failure, unspecified: Secondary | ICD-10-CM | POA: Diagnosis not present

## 2024-07-13 DIAGNOSIS — K529 Noninfective gastroenteritis and colitis, unspecified: Secondary | ICD-10-CM | POA: Diagnosis not present

## 2024-07-13 DIAGNOSIS — R7303 Prediabetes: Secondary | ICD-10-CM | POA: Diagnosis not present

## 2024-07-13 LAB — CBC
HCT: 30.9 % — ABNORMAL LOW (ref 36.0–46.0)
Hemoglobin: 9.4 g/dL — ABNORMAL LOW (ref 12.0–15.0)
MCH: 25.5 pg — ABNORMAL LOW (ref 26.0–34.0)
MCHC: 30.4 g/dL (ref 30.0–36.0)
MCV: 83.7 fL (ref 80.0–100.0)
Platelets: 537 K/uL — ABNORMAL HIGH (ref 150–400)
RBC: 3.69 MIL/uL — ABNORMAL LOW (ref 3.87–5.11)
RDW: 14.7 % (ref 11.5–15.5)
WBC: 13.5 K/uL — ABNORMAL HIGH (ref 4.0–10.5)
nRBC: 0 % (ref 0.0–0.2)

## 2024-07-13 LAB — GASTROINTESTINAL PANEL BY PCR, STOOL (REPLACES STOOL CULTURE)

## 2024-07-13 LAB — GLUCOSE, CAPILLARY
Glucose-Capillary: 116 mg/dL — ABNORMAL HIGH (ref 70–99)
Glucose-Capillary: 133 mg/dL — ABNORMAL HIGH (ref 70–99)
Glucose-Capillary: 207 mg/dL — ABNORMAL HIGH (ref 70–99)
Glucose-Capillary: 88 mg/dL (ref 70–99)

## 2024-07-13 LAB — VITAMIN B12: Vitamin B-12: 1075 pg/mL — ABNORMAL HIGH (ref 180–914)

## 2024-07-13 LAB — COMPREHENSIVE METABOLIC PANEL WITH GFR
ALT: 13 U/L (ref 0–44)
AST: 9 U/L — ABNORMAL LOW (ref 15–41)
Albumin: 2.3 g/dL — ABNORMAL LOW (ref 3.5–5.0)
Alkaline Phosphatase: 63 U/L (ref 38–126)
Anion gap: 9 (ref 5–15)
BUN: 11 mg/dL (ref 8–23)
CO2: 27 mmol/L (ref 22–32)
Calcium: 8.9 mg/dL (ref 8.9–10.3)
Chloride: 100 mmol/L (ref 98–111)
Creatinine, Ser: 0.72 mg/dL (ref 0.44–1.00)
GFR, Estimated: >60 mL/min (ref >60)
Glucose, Bld: 77 mg/dL (ref 70–99)
Potassium: 3.6 mmol/L (ref 3.5–5.1)
Sodium: 136 mmol/L (ref 135–145)
Total Bilirubin: 0.5 mg/dL (ref 0.0–1.2)
Total Protein: 6 g/dL — ABNORMAL LOW (ref 6.5–8.1)

## 2024-07-13 LAB — IRON AND TIBC
Iron: 17 ug/dL — ABNORMAL LOW (ref 28–170)
Saturation Ratios: 9 % — ABNORMAL LOW (ref 10.4–31.8)
TIBC: 195 ug/dL — ABNORMAL LOW (ref 250–450)
UIBC: 178 ug/dL

## 2024-07-13 LAB — FERRITIN: Ferritin: 86 ng/mL (ref 11–307)

## 2024-07-13 LAB — FOLATE: Folate: 11.5 ng/mL (ref >5.9)

## 2024-07-13 LAB — MAGNESIUM: Magnesium: 2.3 mg/dL (ref 1.7–2.4)

## 2024-07-13 LAB — PHOSPHORUS: Phosphorus: 2.6 mg/dL (ref 2.5–4.6)

## 2024-07-13 LAB — C-REACTIVE PROTEIN: CRP: 3.9 mg/dL — ABNORMAL HIGH (ref <1.0)

## 2024-07-13 MED ORDER — POTASSIUM CHLORIDE 20 MEQ PO PACK: 40.0000 meq | PACK | Freq: Once | ORAL | Status: DC

## 2024-07-13 NOTE — Plan of Care (Signed)
  Problem: Education: Goal: Ability to describe self-care measures that may prevent or decrease complications (Diabetes Survival Skills Education) will improve 07/13/2024 0733 by Leonce Olena HERO, RN Outcome: Progressing 07/13/2024 0732 by Leonce Olena HERO, RN Outcome: Progressing Goal: Individualized Educational Video(s) Outcome: Progressing   Problem: Coping: Goal: Ability to adjust to condition or change in health will improve 07/13/2024 0733 by Leonce Olena HERO, RN Outcome: Progressing 07/13/2024 0732 by Leonce Olena HERO, RN Outcome: Progressing   Problem: Fluid Volume: Goal: Ability to maintain a balanced intake and output will improve Outcome: Progressing   Problem: Health Behavior/Discharge Planning: Goal: Ability to identify and utilize available resources and services will improve Outcome: Progressing Goal: Ability to manage health-related needs will improve Outcome: Progressing   Problem: Activity: Goal: Risk for activity intolerance will decrease 07/13/2024 0733 by Leonce Olena HERO, RN Outcome: Progressing 07/13/2024 0732 by Leonce Olena HERO, RN Outcome: Progressing

## 2024-07-13 NOTE — Plan of Care (Signed)
  Problem: Skin Integrity: Goal: Risk for impaired skin integrity will decrease Outcome: Progressing   Problem: Nutritional: Goal: Maintenance of adequate nutrition will improve Outcome: Progressing   Problem: Clinical Measurements: Goal: Ability to maintain clinical measurements within normal limits will improve Outcome: Progressing

## 2024-07-13 NOTE — Plan of Care (Signed)
  Problem: Education: Goal: Ability to describe self-care measures that may prevent or decrease complications (Diabetes Survival Skills Education) will improve 07/13/2024 0733 by Leonce Olena HERO, RN Outcome: Progressing 07/13/2024 0732 by Leonce Olena HERO, RN Outcome: Progressing Goal: Individualized Educational Video(s) Outcome: Progressing   Problem: Coping: Goal: Ability to adjust to condition or change in health will improve 07/13/2024 0733 by Leonce Olena HERO, RN Outcome: Progressing 07/13/2024 0732 by Leonce Olena HERO, RN Outcome: Progressing   Problem: Fluid Volume: Goal: Ability to maintain a balanced intake and output will improve Outcome: Progressing   Problem: Health Behavior/Discharge Planning: Goal: Ability to identify and utilize available resources and services will improve Outcome: Progressing Goal: Ability to manage health-related needs will improve Outcome: Progressing   Problem: Clinical Measurements: Goal: Ability to maintain clinical measurements within normal limits will improve Outcome: Progressing Goal: Will remain free from infection Outcome: Progressing Goal: Diagnostic test results will improve Outcome: Progressing Goal: Respiratory complications will improve Outcome: Progressing Goal: Cardiovascular complication will be avoided Outcome: Progressing

## 2024-07-13 NOTE — Plan of Care (Signed)
  Problem: Education: Goal: Ability to describe self-care measures that may prevent or decrease complications (Diabetes Survival Skills Education) will improve Outcome: Progressing Goal: Individualized Educational Video(s) Outcome: Progressing   Problem: Coping: Goal: Ability to adjust to condition or change in health will improve Outcome: Progressing   Problem: Skin Integrity: Goal: Risk for impaired skin integrity will decrease Outcome: Progressing   Problem: Education: Goal: Knowledge of General Education information will improve Description: Including pain rating scale, medication(s)/side effects and non-pharmacologic comfort measures Outcome: Progressing

## 2024-07-13 NOTE — Progress Notes (Signed)
 [facility]  PROGRESS NOTE  Shanterria Franta FMW:[id number] DOB: [birth year] DOA: 07/10/2024 PCP: Christopher Arland PARAS, PA-C   Brief HPI:    68 yo F admitted for colitis. 68 y.o. female with medical history significant for ulcerative colitis, prediabetes, essential pretension, hyperlipidemia, chronic iron  deficiency anemia with baseline hemoglobin 9-12, who is admitted to [facility] on 07/10/2024 with persistent colitis after presenting from home to [facility] ED complaining of abdominal pain. WBC has been rising on ceftriaxone  and flagyl  (possible it is due to the IV steroids, however, CRP also slightly elevated at 4.1 - 4.6). Pt did not report feeling much better with these 2 antibx. Thus, IV meropenem  was started on 07/12/2024. GI panel in progress as of 11:31 AM 07/12/2024.     Assessment/Plan:   Colitis  -Likely in setting of ulcerative colitis flare -Improving with Solu-Medrol , continue Solu-Medrol  60 mg IV twice daily -Started empirically on IV meropenem  for infectious colitis -Continue clear liquid diet -Will consult gastroenterology for further recommendations and management  Diabetes mellitus type 2 - A1c is 6.7 - Novolog  SS q6 hr  -CBG well-controlled   HLD - Lipitor 40 mg PO daily    HTN - Monitor    Chronic iron  deficiency anemia  - Ferrous sulfate  325 mg PO every other day       Medications     atorvastatin   40 mg Oral Daily   ferrous sulfate   325 mg Oral QODAY   insulin  aspart  0-9 Units Subcutaneous Q6H   methylPREDNISolone  (SOLU-MEDROL ) injection  60 mg Intravenous Daily     Data Reviewed:   CBG:  Recent Labs  Lab 07/12/24 1712 07/13/24 0052 07/13/24 0628 07/13/24 1203 07/13/24 1747  GLUCAP 249* 133* 88 116* 207*    SpO2: 97 %    Vitals:   07/12/24 1117 07/12/24 2042 07/13/24 0500 07/13/24 1208  BP: (!) 140/70 110/71 (!) 141/69 128/73  Pulse: 74 70 65 69  Resp: 17 18 18  (!) 24  Temp: 98.1 F (36.7 C) 98.5 F (36.9 C) 98.3 F  (36.8 C)   TempSrc: Oral Oral Oral   SpO2: 96% 95% 96% 97%  Weight:      Height:          Data Reviewed:  Basic Metabolic Panel: Recent Labs  Lab 07/10/24 1706 07/11/24 0524 07/12/24 0540 07/13/24 0743  NA 133* 134* 135 136  K 4.2 3.8 3.4* 3.6  CL 96* 100 100 100  CO2 25 26 27 27   GLUCOSE 213* 85 82 77  BUN 11 14 12 11   CREATININE 0.97 0.71 0.70 0.72  CALCIUM  9.2 8.6* 8.7* 8.9  MG 2.7* 2.3 2.2 2.3  PHOS  --   --  2.5 2.6    CBC: Recent Labs  Lab 07/10/24 1706 07/11/24 0524 07/12/24 0540 07/13/24 0743  WBC 12.0* 13.7* 14.8* 13.5*  NEUTROABS  --  9.0*  --   --   HGB 9.8* 9.0* 8.9* 9.4*  HCT 31.6* 28.9* 29.3* 30.9*  MCV 84.0 83.0 83.2 83.7  PLT 605* 529* 535* 537*    LFT Recent Labs  Lab 07/10/24 1706 07/11/24 0524 07/12/24 0540 07/13/24 0743  AST 13* 12* 8* 9*  ALT 18 13 14 13   ALKPHOS 78 60 62 63  BILITOT 0.4 <0.2 0.2 0.5  PROT 7.1 5.9* 5.7* 6.0*  ALBUMIN 2.9* 2.3* 2.2* 2.3*     Antibiotics: Anti-infectives (From admission, onward)    Start     Dose/Rate Route Frequency Ordered Stop  07/12/24 1400  meropenem  (MERREM ) 1 g in sodium chloride  0.9 % 100 mL IVPB        1 g 200 mL/hr over 30 Minutes Intravenous Every 8 hours 07/12/24 1051     07/11/24 0145  cefTRIAXone  (ROCEPHIN ) 2 g in sodium chloride  0.9 % 100 mL IVPB  Status:  Discontinued        2 g 200 mL/hr over 30 Minutes Intravenous Every 24 hours 07/11/24 0139 07/12/24 1051   07/11/24 0145  metroNIDAZOLE  (FLAGYL ) IVPB 500 mg  Status:  Discontinued        500 mg 100 mL/hr over 60 Minutes Intravenous Every 12 hours 07/11/24 0139 07/12/24 1051        DVT prophylaxis: SCDs  Code Status: Full code  Family Communication: No family at bedside   CONSULTS    Subjective   Still complains of abdominal cramping   Objective    Physical Examination:  General-appears in no acute distress Heart-S1-S2, regular, no murmur auscultated Lungs-clear to auscultation bilaterally, no  wheezing or crackles auscultated Abdomen-soft, mild generalized tenderness to palpation Extremities-no edema in the lower extremities Neuro-alert, oriented x3, no focal deficit noted  Status is: Inpatient:             Sabas GORMAN Brod   [facility] If 7PM-7AM, please contact night-coverage at [email or web address], Office  [phone]   07/13/2024, 6:02 PM  LOS: 2 days

## 2024-07-14 DIAGNOSIS — E782 Mixed hyperlipidemia: Secondary | ICD-10-CM | POA: Diagnosis not present

## 2024-07-14 DIAGNOSIS — K529 Noninfective gastroenteritis and colitis, unspecified: Secondary | ICD-10-CM | POA: Diagnosis not present

## 2024-07-14 DIAGNOSIS — D509 Iron deficiency anemia, unspecified: Secondary | ICD-10-CM | POA: Diagnosis not present

## 2024-07-14 DIAGNOSIS — K51811 Other ulcerative colitis with rectal bleeding: Secondary | ICD-10-CM | POA: Diagnosis not present

## 2024-07-14 DIAGNOSIS — R1084 Generalized abdominal pain: Secondary | ICD-10-CM | POA: Diagnosis not present

## 2024-07-14 DIAGNOSIS — N179 Acute kidney failure, unspecified: Secondary | ICD-10-CM | POA: Diagnosis not present

## 2024-07-14 LAB — COMPREHENSIVE METABOLIC PANEL WITH GFR
ALT: 12 U/L (ref 0–44)
AST: 9 U/L — ABNORMAL LOW (ref 15–41)
Albumin: 2.5 g/dL — ABNORMAL LOW (ref 3.5–5.0)
Alkaline Phosphatase: 71 U/L (ref 38–126)
Anion gap: 10 (ref 5–15)
BUN: 9 mg/dL (ref 8–23)
CO2: 29 mmol/L (ref 22–32)
Calcium: 9 mg/dL (ref 8.9–10.3)
Chloride: 97 mmol/L — ABNORMAL LOW (ref 98–111)
Creatinine, Ser: 0.75 mg/dL (ref 0.44–1.00)
GFR, Estimated: >60 mL/min (ref >60)
Glucose, Bld: 77 mg/dL (ref 70–99)
Potassium: 4.1 mmol/L (ref 3.5–5.1)
Sodium: 136 mmol/L (ref 135–145)
Total Bilirubin: 0.3 mg/dL (ref 0.0–1.2)
Total Protein: 6.1 g/dL — ABNORMAL LOW (ref 6.5–8.1)

## 2024-07-14 LAB — CBC
HCT: 32.9 % — ABNORMAL LOW (ref 36.0–46.0)
Hemoglobin: 10 g/dL — ABNORMAL LOW (ref 12.0–15.0)
MCH: 25.4 pg — ABNORMAL LOW (ref 26.0–34.0)
MCHC: 30.4 g/dL (ref 30.0–36.0)
MCV: 83.5 fL (ref 80.0–100.0)
Platelets: 555 K/uL — ABNORMAL HIGH (ref 150–400)
RBC: 3.94 MIL/uL (ref 3.87–5.11)
RDW: 14.9 % (ref 11.5–15.5)
WBC: 15.4 K/uL — ABNORMAL HIGH (ref 4.0–10.5)
nRBC: 0.1 % (ref 0.0–0.2)

## 2024-07-14 LAB — C DIFFICILE (CDIFF) QUICK SCRN (NO PCR REFLEX)
C Diff antigen: NEGATIVE
C Diff interpretation: NOT DETECTED
C Diff toxin: NEGATIVE

## 2024-07-14 LAB — GLUCOSE, CAPILLARY
Glucose-Capillary: 130 mg/dL — ABNORMAL HIGH (ref 70–99)
Glucose-Capillary: 194 mg/dL — ABNORMAL HIGH (ref 70–99)
Glucose-Capillary: 78 mg/dL (ref 70–99)
Glucose-Capillary: 92 mg/dL (ref 70–99)

## 2024-07-14 LAB — C-REACTIVE PROTEIN: CRP: 2.4 mg/dL — ABNORMAL HIGH (ref <1.0)

## 2024-07-14 LAB — PHOSPHORUS: Phosphorus: 2.6 mg/dL (ref 2.5–4.6)

## 2024-07-14 LAB — MAGNESIUM: Magnesium: 2.4 mg/dL (ref 1.7–2.4)

## 2024-07-14 MED ORDER — ALUM & MAG HYDROXIDE-SIMETH 200-200-20 MG/5ML PO SUSP
15.0000 mL | Freq: Four times a day (QID) | ORAL | Status: DC | PRN
  Administered 2024-07-17 – 2024-07-18 (×2): 15 mL via ORAL
  Filled 2024-07-14 (×2): qty 30

## 2024-07-14 MED ORDER — METHYLPREDNISOLONE SODIUM SUCC 40 MG IJ SOLR
20.0000 mg | Freq: Two times a day (BID) | INTRAMUSCULAR | Status: DC
  Administered 2024-07-14 – 2024-07-19 (×10): 20 mg via INTRAVENOUS
  Filled 2024-07-14 (×10): qty 1

## 2024-07-14 NOTE — Consult Note (Addendum)
 Consultation  Referring Provider:     [facility] Primary Care Physician:  Drosinis, Arland PARAS, PA-C Primary Gastroenterologist:        Dr. Rollin Reason for Consultation:     Ulcerative colitis            HPI:   Nancy Eaton is a 68 y.o. female with worsening colitis symptoms.  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 - No significant relief from suppositories or enemas - Persistent blood in stool despite steroid therapy - Symptoms remain overwhelming and frustrating, with significant impact on daily life  Recent infection and antibiotic use - Tooth infection approximately one month ago required antibiotics and a root canal - Antibiotic use associated with worsening colitis symptoms and increased sickness - Facial swelling occurred in the context of the infection  Medication response and management - Currently receiving Inflectra  infusions every two months - Steroids have provided some relief, but ongoing blood in stool raises uncertainty about efficacy - Desire to avoid reliance on pain medication - Suppositories and enemas have not provided significant improvement  Unable to access Dr. Curtis office visit notes or any communications or any workup that was done as outpatient   ED Course:  Vital signs in the ED were notable for the following: Afebrile; heart rate in the 60s to 77.  Blood pressures in the 120s to 140s; respiratory rate 16-17, oxygen saturation 98 to 100% room air.   Labs were notable for the following: CMP was notable for the following: Sodium 133, which corrects to approximately 135 after adjustment for concomitant  hyperglycemia, potassium 4.2, bicarbonate 25, anion gap 12, creatinine 0.97 compared to 0.63 on 06/23/2024, glucose 213, liver enzymes were within normal limits.  Lipase 23.  CBC notable for white blood cell count 12,000 compared to most recent prior will blood cell count of 16,400 on 06/23/2024, hemoglobin 9.8 compared to 11.3 on 06/23/2024, and associated with normocytic/Norocarp properties as well as nonelevated RDW, platelet count 65 compared to 487 on 06/23/2024.  Urinalysis showed no white blood cells, leukocyte esterase/nitrate negative, as well as small hemoglobin in the absence of any RBCs.   Per my interpretation, EKG in ED demonstrated the following: No EKG performed in the ED today.   Imaging in the ED, per corresponding formal radiology read, was notable for the following: CT abdomen/pelvis with contrast, in comparison to CT abdomen/pelvis with contrast performed on 06/23/2024, shows continued colonic wall thickening from the splenic flexure through the sigmoid colon consistent with infectious versus inflammatory colitis, similar to most recent prior CT abdomen/pelvis, will demonstrating no evidence of associated abscess, bowel striction, or perforation.   While in the ED, the following were administered: Dilaudid  0.5 mg IV x 1, lactated Ringer 's x 1 L bolus.   Subsequently, the patient was admitted for further evaluation management of persistent/refractory colitis, with presenting labs also notable for acute kidney injury.  Past Medical History:  Diagnosis Date   Arthritis    Knee   Hyperlipidemia    Lovastatin  started approx 2011   IFG (impaired fasting glucose) 2018   HbA1c 5.9% (stable)   PONV (postoperative nausea and vomiting)    Pre-diabetes  Right knee pain 12/2019   End stage: TKA recommended by ortho 01/2020->pt considering   Sigmoid diverticulosis 04/2010                    UC (ulcerative colitis) [facility])     Past Surgical History:  Procedure Laterality Date   BREAST  ENHANCEMENT SURGERY     As of mammogram 12/2017--rupture of implants stable.   CARPAL TUNNEL RELEASE     CESAREAN SECTION     COLONOSCOPY  age 81   [facility]--recall 10 yrs   FLEXIBLE SIGMOIDOSCOPY N/A 02/02/2024   Procedure: KINGSTON SIDE;  Surgeon: Rollin Dover, MD;  Location: [facility];  Service: Gastroenterology;  Laterality: N/A;   TOTAL KNEE ARTHROPLASTY Right 07/17/2020   Procedure: RIGHT TOTAL KNEE ARTHROPLASTY;  Surgeon: Vernetta Lonni GRADE, MD;  Location: [facility];  Service: Orthopedics;  Laterality: Right;    Family History  Problem Relation Age of Onset   Cancer Mother        Brain   Alcohol abuse Father    COPD Brother    ADD / ADHD Son    Anxiety disorder Son    Heart disease Brother      Social History   Tobacco Use   Smoking status: Never   Smokeless tobacco: Never  Vaping Use   Vaping status: Never Used  Substance Use Topics   Alcohol use: Yes    Comment: socially   Drug use: No    Prior to Admission medications   Medication Sig Start Date End Date Taking? Authorizing Provider  acetaminophen  (TYLENOL ) 500 MG tablet Take 500 mg by mouth every 6 (six) hours as needed for moderate pain (pain score 4-6).   Yes [provider]  atorvastatin  (LIPITOR) 40 MG tablet Take 1 tablet (40 mg total) by mouth daily. 02/25/19  Yes McGowen, Aleene DEL, MD  FEROSUL 325 (65 Fe) MG tablet Take 325 mg by mouth every other day. 06/04/24  Yes [provider]  losartan  (COZAAR ) 50 MG tablet Take 50 mg by mouth daily. 01/03/24  Yes [provider]  ondansetron  (ZOFRAN -ODT) 4 MG disintegrating tablet Take 1 tablet (4 mg total) by mouth every 8 (eight) hours as needed for nausea or vomiting. 06/23/24  Yes Dreama Longs, MD  oxyCODONE  (ROXICODONE ) 5 MG immediate release tablet Take 1 tablet (5 mg total) by mouth every 4 (four) hours as needed for severe pain (pain score 7-10). 06/23/24  Yes Dreama Longs, MD  Ascorbic Acid  (C EXTRA STRENGTH) 1000 MG  TABS Take 1 tablet by mouth every other day. Patient not taking: Reported on 07/11/2024 06/04/24   [provider]  azithromycin  (ZITHROMAX ) 250 MG tablet Take 250 mg by mouth as directed. Patient not taking: Reported on 07/11/2024 06/07/24   [provider]  CALCIUM -VITAMIN D  PO Take 2 tablets by mouth daily. Patient not taking: Reported on 07/11/2024    [provider]  Cholecalciferol 1.25 MG (50000 UT) capsule Take 50,000 Units by mouth once a week. Patient not taking: Reported on 07/11/2024 06/04/24 09/02/24  [provider]  clindamycin  (CLEOCIN ) 300 MG capsule Take 300 mg by mouth 3 (three) times daily. Patient not taking: Reported on 07/11/2024 06/18/24   [provider]  Glucosamine HCl-MSM (GLUCOSAMINE-MSM PO) Take 1 tablet by mouth daily. Patient not taking: Reported on 07/11/2024    [provider]  INFLECTRA  100 MG SOLR Inject into the vein. Patient not taking: Reported on 07/11/2024 05/27/24   [provider]  mesalamine  (CANASA ) 1000 MG suppository Place 1,000 mg rectally at bedtime. Patient not taking: Reported on 07/11/2024 02/07/24   [provider]  mesalamine  (LIALDA ) 1.2 g EC tablet Take 2.4 g by mouth daily. Patient not taking: Reported on 07/11/2024 02/07/24   [provider]  methylPREDNISolone  (MEDROL  DOSEPAK) 4 MG TBPK tablet Take by mouth as directed. Patient not taking: Reported on 07/11/2024 06/05/24   [provider]  Multiple Vitamins-Minerals (MULTIVITAMIN WITH MINERALS) tablet Take 1 tablet by mouth daily. Women 50+ Patient not taking: Reported on 07/11/2024    [provider]  Multiple Vitamins-Minerals (PRESERVISION AREDS 2) CAPS Take 1 tablet by mouth daily. Patient not taking: Reported on 07/11/2024    [provider]  nystatin (MYCOSTATIN) 100000 UNIT/ML suspension Take 4 mLs by mouth 4 (four) times daily. Patient not taking: Reported on 07/11/2024 02/08/24   [provider]  Omega-3 Fatty Acids (FISH OIL PO) Take 1,400 mg by mouth daily. Omega3 980 mg Patient not taking: Reported on 07/11/2024    [provider]  Turmeric 500 MG CAPS Take 1,500 mg by mouth in the morning, at noon, and at bedtime.  Patient not taking: Reported on 07/11/2024    [provider]    Current Facility-Administered Medications  Medication Dose Route Frequency Provider Last Rate Last Admin   acetaminophen  (TYLENOL ) tablet 650 mg  650 mg Oral Q6H PRN Howerter, Justin B, DO       Or   acetaminophen  (TYLENOL ) suppository 650 mg  650 mg Rectal Q6H PRN Howerter, Justin B, DO       atorvastatin  (LIPITOR) tablet 40 mg  40 mg Oral Daily Howerter, Justin B, DO   40 mg at 07/14/24 1015   ferrous sulfate  tablet 325 mg  325 mg Oral QODAY Howerter, Justin B, DO   325 mg at 07/13/24 9081   HYDROmorphone  (DILAUDID ) injection 0.5 mg  0.5 mg Intravenous Q2H PRN Howerter, Justin B, DO   0.5 mg at 07/14/24 9371   insulin  aspart (novoLOG ) injection 0-9 Units  0-9 Units Subcutaneous Q6H Howerter, Justin B, DO   1 Units at 07/14/24 0033   melatonin tablet 3 mg  3 mg Oral QHS PRN Howerter, Justin B, DO       meropenem  (MERREM ) 1 g in sodium chloride  0.9 % 100 mL IVPB  1 g Intravenous Q8H Sira, Zackery, MD 200 mL/hr at 07/14/24 0630 1 g at 07/14/24 0630   methylPREDNISolone  sodium succinate (SOLU-MEDROL ) 125 mg/2 mL injection 60 mg  60 mg Intravenous Daily Howerter, Justin B, DO   60 mg at 07/14/24 1015   naloxone  (NARCAN ) injection 0.4 mg  0.4 mg Intravenous PRN Howerter, Justin B, DO       ondansetron  (ZOFRAN ) injection 4 mg  4 mg Intravenous Q6H PRN Howerter, Justin B, DO        Allergies as of 07/10/2024 - Review Complete 07/10/2024  Allergen Reaction Noted   Penicillins Rash 09/30/2013     Review of Systems:    This is positive for those things mentioned in the HPI . All other review of systems are negative.       Physical Exam:  Vital signs in last 24  hours: Temp:  [98.1 F (36.7 C)] 98.1 F (36.7 C) (08/24 0456) Pulse Rate:  [64-71] 64 (08/24 0456) Resp:  [18-24] 18 (08/24 0456) BP: (128-135)/(73-84) 131/75 (08/24 0456) SpO2:  [95 %-97 %] 95 % (08/24 0456) Weight:  [81.9 kg] 81.9 kg (08/24 0505) Last  BM Date : 07/14/24  GENERAL: Alert, cooperative, well developed, no acute distress HEENT: Normocephalic, normal oropharynx, moist mucous membranes CHEST: Clear to auscultation bilaterally, no wheezes, rhonchi, or crackles CARDIOVASCULAR: Normal heart rate and rhythm, S1 and S2 normal without murmurs ABDOMEN: Soft, non-tender, non-distended, without organomegaly, normal bowel sounds, no pain on palpation EXTREMITIES: No cyanosis or edema NEUROLOGICAL: Cranial nerves grossly intact, moves all extremities without gross motor or sensory deficit  Data Reviewed:   LAB RESULTS: Recent Labs    07/12/24 0540 07/13/24 0743 07/14/24 0604  WBC 14.8* 13.5* 15.4*  HGB 8.9* 9.4* 10.0*  HCT 29.3* 30.9* 32.9*  PLT 535* 537* 555*   BMET Recent Labs    07/12/24 0540 07/13/24 0743 07/14/24 0604  NA 135 136 136  K 3.4* 3.6 4.1  CL 100 100 97*  CO2 27 27 29   GLUCOSE 82 77 77  BUN 12 11 9   CREATININE 0.70 0.72 0.75  CALCIUM  8.7* 8.9 9.0   LFT Recent Labs    07/14/24 0604  PROT 6.1*  ALBUMIN 2.5*  AST 9*  ALT 12  ALKPHOS 71  BILITOT 0.3   PT/INR No results for input(s): LABPROT, INR in the last 72 hours.  STUDIES: No results found.   PREVIOUS ENDOSCOPIES:             Flexible sigmoidoscopy by Belvie Just February 01, 2024 Inflammation was found in a continuous and circumferential pattern from the rectum to the descending colon. This was graded as Mayo Score 3 ( severe, with spontaneous bleeding, ulcerations) . Biopsies were taken with a cold forceps for histology.  A severe ( Mayo Score 3) left sided colitis was identified. The current findings are consistent with a left- sided UC. Multiple cold biopsies were  obtained. Impression: - Severe ( Mayo Score 3) left- sided ulcerative colitis. Biopsied. A. DESCENDING COLON, BIOPSY:  Chronic active colitis with crypt abscesses consistent with ulcerative  colitis  Negative for granulomas and dysplasia      Impression / Plan:  68 year old female with history of left side ulcerative with acute flare and rectal bleeding  Chronic left-sided ulcerative colitis with an acute flare since March, characterized by severe symptoms including nausea, vomiting, abdominal pain, and frequent bowel movements with blood and mucus, significant tenesmus.  Symptoms have been debilitating and unresponsive to current treatment with Inflectra . Possible development of antibodies to infliximab , reducing its efficacy. Differential diagnosis includes infection such as C. difficile or CMV colitis. Current treatment includes IV steroids, which have provided some relief. Consideration of alternative medications such as Rinvoq , pending insurance approval and test results. Multiple treatment options available, including pill, infusion, and injectable forms. Decision on medication will consider insurance coverage to ensure outpatient continuity. - Continue IV steroids to manage acute symptoms.  Methylprednisolone  20 mg twice daily - Order blood work to check for antibodies to infliximab  and drug levels. - Order fecal calprotectin stool test to assess inflammation. - Check for C. difficile infection and CMV colitis. - Consider flexible sigmoidoscopy based on test results if continues to have persistent symptoms. - Discuss potential switch to Rinvoq  pending test results and insurance approval. - Initiate soft diet as tolerated. - DC antibiotics, no signs of infection, GI path panel negative, no leukocytosis or fever  Acute kidney injury Kidney function slightly impaired but expected to improve with IV fluids. - Administer IV fluids to improve kidney function.   Goals of Care Expressed  frustration with ongoing symptoms and desire for effective treatment. Emphasized importance  of selecting a medication that is effective and covered by insurance to avoid frequent hospital visits.   Dr. Rollin will resume care tomorrow   The patient was provided an opportunity to ask questions and all were answered. The patient agreed with the plan and demonstrated an understanding of the instructions.    LOIS Wilkie Mcgee , MD [phone]

## 2024-07-14 NOTE — Plan of Care (Signed)
  Problem: Education: Goal: Ability to describe self-care measures that may prevent or decrease complications (Diabetes Survival Skills Education) will improve Outcome: Progressing Goal: Individualized Educational Video(s) Outcome: Progressing   Problem: Health Behavior/Discharge Planning: Goal: Ability to identify and utilize available resources and services will improve Outcome: Progressing Goal: Ability to manage health-related needs will improve Outcome: Progressing   Problem: Education: Goal: Knowledge of General Education information will improve Description: Including pain rating scale, medication(s)/side effects and non-pharmacologic comfort measures Outcome: Progressing   Problem: Health Behavior/Discharge Planning: Goal: Ability to manage health-related needs will improve Outcome: Progressing   Problem: Clinical Measurements: Goal: Diagnostic test results will improve Outcome: Progressing Goal: Respiratory complications will improve Outcome: Progressing   Problem: Nutrition: Goal: Adequate nutrition will be maintained Outcome: Progressing   Problem: Elimination: Goal: Will not experience complications related to urinary retention Outcome: Progressing   Problem: Pain Managment: Goal: General experience of comfort will improve and/or be controlled Outcome: Progressing   Problem: Skin Integrity: Goal: Risk for impaired skin integrity will decrease Outcome: Progressing

## 2024-07-14 NOTE — Plan of Care (Signed)
  Problem: Coping: Goal: Ability to adjust to condition or change in health will improve Outcome: Progressing   Problem: Health Behavior/Discharge Planning: Goal: Ability to manage health-related needs will improve Outcome: Progressing   Problem: Metabolic: Goal: Ability to maintain appropriate glucose levels will improve Outcome: Progressing   Problem: Nutritional: Goal: Maintenance of adequate nutrition will improve Outcome: Progressing   Problem: Education: Goal: Knowledge of General Education information will improve Description: Including pain rating scale, medication(s)/side effects and non-pharmacologic comfort measures Outcome: Progressing   Problem: Clinical Measurements: Goal: Diagnostic test results will improve Outcome: Progressing

## 2024-07-14 NOTE — Progress Notes (Signed)
 PROGRESS NOTE    Seattle Dalporto  FMW:[id number] DOB: Jul 14, 1956 DOA: 07/10/2024 PCP: Christopher Arland PARAS, PA-C   Brief Narrative:  The patient is an obese 68 yo F admitted for colitis. 68 y.o. female with medical history significant for ulcerative colitis, prediabetes, essential pretension, hyperlipidemia, chronic iron  deficiency anemia with baseline hemoglobin 9-12, who is admitted to [facility] on 07/10/2024 with persistent colitis after presenting from home to [facility] ED complaining of abdominal pain. WBC has been rising on ceftriaxone  and flagyl  (possible it is due to the IV steroids, however, CRP also slightly elevated at 4.1 - 4.6). Pt did not report feeling much better with these 2 antibx.  Meropenem  was started however and now GIs recommended discontinuing it given that they feel that he is no infectious symptoms and they feel that the symptoms related to ulcerative colitis.  Patient remains on steroids and gastroenterology is following.  Assessment and Plan:  Acute on chronic left-sided ulcerative colitis: Likely in setting of ulcerative colitis flare was worsening since March despite Inflectra  infusions every 2 months.  Last Inflectra  infusion was in early July.  Patient had severe nausea vomiting and abdominal discomfort and has multiple bowel movements daily with urgency resulting in incontinence and has had bloody bowel movements with mucus as well. -Improving with Solu-Medrol , continue Solu-Medrol  20 mg mg IV twice daily -Started empirically on IV meropenem  for infectious colitis but GI recommended discontinuing given that they feel that there is no signs of infection and the GI pathogen panel was negative given that she has no leukocytosis or fever.   -Checking C. difficile infection and CMV colitis.  GIs been consulted ordering more blood work to check for antibiotic infliximab  drug level and ordering a fecal calprotectin stool test to assess inflammation and recommending soft  diet as tolerated. -Continue clear liquid diet to be advanced to a soft diet as tolerated -Appreciate further evaluation and recommendation by gastroenterology and they are discussing potential switch to Rinvoq  pending test results and insurance approval   Diabetes mellitus type 2:  A1c is 6.7. Novolog  SS q6 hr. CBG well-controlled   HLD: C/w Atorvastatin  40 mg po Daily    Essential HTN: Losartan  50 mg po Daily held. CTM BP per Protocol. Last BP Reading was 116/69  Leukocytosis: In the setting of Steroid Demargination and Colitis as above. WBC went from 16.4 -> 12.0 -> 13.7 -> 14.8 -> 13.5 -> 15.4. C/w Abx and CTM for S/Sx of Infection. Repeat CBC in the AM  Chronic Iron  Deficiency Anemia/Normocytic Anemia: Hgb/Hct Trend relatively stable at 10.0/32.9 w/ MCV of 83.5. C/w Ferrous Sulfate  325 mg po EOD. Check Anemia Panel in the AM. CTM for S/Sx of Bleeding; No overt bleeding noted. Repeat CBC in the AM  Thrombocytosis: Plt Count went from 605 -> 529 -> 535 -> 537 -> 555. Likely Reactive. CTM for S/Sx of Bleeding; No overt bleeding noted. Repeat CBC in the AM  Hypoalbuminemia: Patient's Albumin Lvl Trend between 2.2 -> 2.9 -> 2.5. CTM and Trend and repeat CMP in the AM  Class I Obesity: Complicates overall prognosis and care. Estimated body mass index is 31.98 kg/m as calculated from the following:   Height as of this encounter: 5' 3 (1.6 m).   Weight as of this encounter: 81.9 kg. Weight Loss and Dietary Counseling given   DVT prophylaxis: SCDs Start: 07/11/24 0137    Code Status: Full Code Family Communication: No family present @ bedside  Disposition Plan:  Level of  care: Telemetry Status is: Inpatient Remains inpatient appropriate because: Needs further clinical improvement and clearance by GI   Consultants:  Gastroenterology  Procedures:  As delineated as above  Antimicrobials:  Anti-infectives (From admission, onward)    Start     Dose/Rate Route Frequency Ordered Stop    07/12/24 1400  meropenem  (MERREM ) 1 g in sodium chloride  0.9 % 100 mL IVPB  Status:  Discontinued        1 g 200 mL/hr over 30 Minutes Intravenous Every 8 hours 07/12/24 1051 07/14/24 1036   07/11/24 0145  cefTRIAXone  (ROCEPHIN ) 2 g in sodium chloride  0.9 % 100 mL IVPB  Status:  Discontinued        2 g 200 mL/hr over 30 Minutes Intravenous Every 24 hours 07/11/24 0139 07/12/24 1051   07/11/24 0145  metroNIDAZOLE  (FLAGYL ) IVPB 500 mg  Status:  Discontinued        500 mg 100 mL/hr over 60 Minutes Intravenous Every 12 hours 07/11/24 0139 07/12/24 1051       Subjective: Seen and examined at bedside he was still having some abdominal discomfort.  Also states that she is having bloody stools.  No nausea or vomiting.  Feels okay otherwise.  No other concerns or complaints at this time.  Objective: Vitals:   07/13/24 2013 07/14/24 0456 07/14/24 0505 07/14/24 1158  BP: 135/84 131/75  116/69  Pulse: 71 64  67  Resp: 18 18  20   Temp: 98.1 F (36.7 C) 98.1 F (36.7 C)  98.3 F (36.8 C)  TempSrc: Oral Oral    SpO2:  95%  96%  Weight:   81.9 kg   Height:        Intake/Output Summary (Last 24 hours) at 07/14/2024 2105 Last data filed at 07/14/2024 8151 Gross per 24 hour  Intake 720 ml  Output --  Net 720 ml   Filed Weights   07/10/24 1647 07/14/24 0505  Weight: 84 kg 81.9 kg   Examination: Physical Exam:  Constitutional: WN/WD, obese Caucasian female no acute distress Respiratory: Diminished to auscultation bilaterally, no wheezing, rales, rhonchi or crackles. Normal respiratory effort and patient is not tachypenic. No accessory muscle use.  Unlabored breathing Cardiovascular: RRR, no murmurs / rubs / gallops. S1 and S2 auscultated. No extremity edema. 2+ pedal pulses. No carotid bruits.  Abdomen: Soft, tender to palpation distended second body habitus.. Bowel sounds positive.  GU: Deferred. Musculoskeletal: No clubbing / cyanosis of digits/nails. No joint deformity upper and  lower extremities.  Skin: No rashes, lesions, ulcers limited skin evaluation. No induration; Warm and dry.  Neurologic: CN 2-12 grossly intact with no focal deficits. Romberg sign cerebellar reflexes not assessed.  Psychiatric: Normal judgment and insight. Alert and oriented x 3. Normal mood and appropriate affect.   Data Reviewed: I have personally reviewed following labs and imaging studies  CBC: Recent Labs  Lab 07/10/24 1706 07/11/24 0524 07/12/24 0540 07/13/24 0743 07/14/24 0604  WBC 12.0* 13.7* 14.8* 13.5* 15.4*  NEUTROABS  --  9.0*  --   --   --   HGB 9.8* 9.0* 8.9* 9.4* 10.0*  HCT 31.6* 28.9* 29.3* 30.9* 32.9*  MCV 84.0 83.0 83.2 83.7 83.5  PLT 605* 529* 535* 537* 555*   Basic Metabolic Panel: Recent Labs  Lab 07/10/24 1706 07/11/24 0524 07/12/24 0540 07/13/24 0743 07/14/24 0604  NA 133* 134* 135 136 136  K 4.2 3.8 3.4* 3.6 4.1  CL 96* 100 100 100 97*  CO2 25 26 27  27  29  GLUCOSE 213* 85 82 77 77  BUN 11 14 12 11 9   CREATININE 0.97 0.71 0.70 0.72 0.75  CALCIUM  9.2 8.6* 8.7* 8.9 9.0  MG 2.7* 2.3 2.2 2.3 2.4  PHOS  --   --  2.5 2.6 2.6   GFR: Estimated Creatinine Clearance: 67.3 mL/min (by C-G formula based on SCr of 0.75 mg/dL). Liver Function Tests: Recent Labs  Lab 07/10/24 1706 07/11/24 0524 07/12/24 0540 07/13/24 0743 07/14/24 0604  AST 13* 12* 8* 9* 9*  ALT 18 13 14 13 12   ALKPHOS 78 60 62 63 71  BILITOT 0.4 <0.2 0.2 0.5 0.3  PROT 7.1 5.9* 5.7* 6.0* 6.1*  ALBUMIN 2.9* 2.3* 2.2* 2.3* 2.5*   Recent Labs  Lab 07/10/24 1706  LIPASE 23   No results for input(s): AMMONIA in the last 168 hours. Coagulation Profile: No results for input(s): INR, PROTIME in the last 168 hours. Cardiac Enzymes: Recent Labs  Lab 07/11/24 0524  CKTOTAL 15*   BNP (last 3 results) No results for input(s): PROBNP in the last 8760 hours. HbA1C: No results for input(s): HGBA1C in the last 72 hours. CBG: Recent Labs  Lab 07/13/24 1747 07/14/24 0005  07/14/24 0548 07/14/24 1155 07/14/24 1819  GLUCAP 207* 130* 78 92 194*   Lipid Profile: No results for input(s): CHOL, HDL, LDLCALC, TRIG, CHOLHDL, LDLDIRECT in the last 72 hours. Thyroid Function Tests: No results for input(s): TSH, T4TOTAL, FREET4, T3FREE, THYROIDAB in the last 72 hours. Anemia Panel: Recent Labs    07/13/24 0743  VITAMINB12 1,075*  FOLATE 11.5  FERRITIN 86  TIBC 195*  IRON  17*   Sepsis Labs: No results for input(s): PROCALCITON, LATICACIDVEN in the last 168 hours.  Recent Results (from the past 240 hours)  Gastrointestinal Panel by PCR , Stool     Status: None   Collection Time: 07/12/24 11:31 AM   Specimen: Stool  Result Value Ref Range Status   Campylobacter species NOT DETECTED NOT DETECTED Final   Plesimonas shigelloides NOT DETECTED NOT DETECTED Final   Salmonella species NOT DETECTED NOT DETECTED Final   Yersinia enterocolitica NOT DETECTED NOT DETECTED Final   Vibrio species NOT DETECTED NOT DETECTED Final   Vibrio cholerae NOT DETECTED NOT DETECTED Final   Enteroaggregative E coli (EAEC) NOT DETECTED NOT DETECTED Final   Enteropathogenic E coli (EPEC) NOT DETECTED NOT DETECTED Final   Enterotoxigenic E coli (ETEC) NOT DETECTED NOT DETECTED Final   Shiga like toxin producing E coli (STEC) NOT DETECTED NOT DETECTED Final   Shigella/Enteroinvasive E coli (EIEC) NOT DETECTED NOT DETECTED Final   Cryptosporidium NOT DETECTED NOT DETECTED Final   Cyclospora cayetanensis NOT DETECTED NOT DETECTED Final   Entamoeba histolytica NOT DETECTED NOT DETECTED Final   Giardia lamblia NOT DETECTED NOT DETECTED Final   Adenovirus F40/41 NOT DETECTED NOT DETECTED Final   Astrovirus NOT DETECTED NOT DETECTED Final   Norovirus GI/GII NOT DETECTED NOT DETECTED Final   Rotavirus A NOT DETECTED NOT DETECTED Final   Sapovirus (I, II, IV, and V) NOT DETECTED NOT DETECTED Final    Comment: Performed at [facility], [address] [place], [place] [address]  C Difficile Quick Screen (NO PCR Reflex)     Status: None   Collection Time: 07/14/24  5:24 PM   Specimen: STOOL  Result Value Ref Range Status   C Diff antigen NEGATIVE NEGATIVE Final   C Diff toxin NEGATIVE NEGATIVE Final   C Diff interpretation No C.  difficile detected.  Final    Comment: Performed at [facility], [address] [place], [place] [address]    Radiology Studies: No results found.  Scheduled Meds:  atorvastatin   40 mg Oral Daily   ferrous sulfate   325 mg Oral QODAY   insulin  aspart  0-9 Units Subcutaneous Q6H   methylPREDNISolone  (SOLU-MEDROL ) injection  20 mg Intravenous Q12H   Continuous Infusions:   LOS: 3 days   Alejandro Marker, DO [facility] Available via Epic secure chat 7am-7pm After these hours, please refer to coverage provider listed on [email or web address] 07/14/2024, 9:05 PM

## 2024-07-14 NOTE — Hospital Course (Addendum)
 The patient is an obese 68 yo F admitted for colitis. 68 y.o. female with medical history significant for ulcerative colitis, prediabetes, essential pretension, hyperlipidemia, chronic iron  deficiency anemia with baseline hemoglobin 9-12, who is admitted to [facility] on 07/10/2024 with persistent colitis after presenting from home to [facility] ED complaining of abdominal pain. WBC has been rising on ceftriaxone  and flagyl  (possible it is due to the IV steroids, however, CRP also slightly elevated at 4.1 - 4.6). Pt did not report feeling much better with these 2 antibx.  Meropenem  was started however and now GIs recommended discontinuing it given that they feel that he is no infectious symptoms and they feel that the symptoms related to ulcerative colitis.  Patient remains on steroids and gastroenterology is following.  Assessment and Plan:  Acute on chronic left-sided ulcerative colitis: Likely in setting of ulcerative colitis flare was worsening since March despite Inflectra  infusions every 2 months.  Last Inflectra  infusion was in early July.  Patient had severe nausea vomiting and abdominal discomfort and has multiple bowel movements daily with urgency resulting in incontinence and has had bloody bowel movements with mucus as well. -Improving with Solu-Medrol , continue Solu-Medrol  20 mg mg IV twice daily -Started empirically on IV meropenem  for infectious colitis but GI recommended discontinuing given that they feel that there is no signs of infection and the GI pathogen panel was negative given that she has no leukocytosis or fever.   -Checking C. difficile infection and CMV colitis.  GIs been consulted ordering more blood work to check for antibiotic infliximab  drug level and ordering a fecal calprotectin stool test to assess inflammation and recommending soft diet as tolerated. -Continue clear liquid diet to be advanced to a soft diet as tolerated -Appreciate further evaluation and recommendation  by gastroenterology and they are discussing potential switch to Rinvoq  pending test results and insurance approval   Diabetes mellitus type 2:  A1c is 6.7. Novolog  SS q6 hr. CBG well-controlled   HLD: C/w Atorvastatin  40 mg po Daily    Essential HTN: Losartan  50 mg po Daily held. CTM BP per Protocol. Last BP Reading was 116/69  Leukocytosis: In the setting of Steroid Demargination and Colitis as above. WBC went from 16.4 -> 12.0 -> 13.7 -> 14.8 -> 13.5 -> 15.4. C/w Abx and CTM for S/Sx of Infection. Repeat CBC in the AM  Chronic Iron  Deficiency Anemia/Normocytic Anemia: Hgb/Hct Trend relatively stable at 10.0/32.9 w/ MCV of 83.5. C/w Ferrous Sulfate  325 mg po EOD. Check Anemia Panel in the AM. CTM for S/Sx of Bleeding; No overt bleeding noted. Repeat CBC in the AM  Thrombocytosis: Plt Count went from 605 -> 529 -> 535 -> 537 -> 555. Likely Reactive. CTM for S/Sx of Bleeding; No overt bleeding noted. Repeat CBC in the AM  Hypoalbuminemia: Patient's Albumin Lvl Trend between 2.2 -> 2.9 -> 2.5. CTM and Trend and repeat CMP in the AM  Class I Obesity: Complicates overall prognosis and care. Estimated body mass index is 31.98 kg/m as calculated from the following:   Height as of this encounter: 5' 3 (1.6 m).   Weight as of this encounter: 81.9 kg. Weight Loss and Dietary Counseling given

## 2024-07-15 DIAGNOSIS — K529 Noninfective gastroenteritis and colitis, unspecified: Secondary | ICD-10-CM | POA: Diagnosis not present

## 2024-07-15 LAB — GLUCOSE, CAPILLARY
Glucose-Capillary: 168 mg/dL — ABNORMAL HIGH (ref 70–99)
Glucose-Capillary: 171 mg/dL — ABNORMAL HIGH (ref 70–99)
Glucose-Capillary: 175 mg/dL — ABNORMAL HIGH (ref 70–99)
Glucose-Capillary: 192 mg/dL — ABNORMAL HIGH (ref 70–99)
Glucose-Capillary: 197 mg/dL — ABNORMAL HIGH (ref 70–99)

## 2024-07-15 LAB — CBC WITH DIFFERENTIAL/PLATELET
Abs Immature Granulocytes: 0.25 K/uL — ABNORMAL HIGH (ref 0.00–0.07)
Basophils Absolute: 0 K/uL (ref 0.0–0.1)
Basophils Relative: 0 %
Eosinophils Absolute: 0 K/uL (ref 0.0–0.5)
Eosinophils Relative: 0 %
HCT: 30 % — ABNORMAL LOW (ref 36.0–46.0)
Hemoglobin: 9.5 g/dL — ABNORMAL LOW (ref 12.0–15.0)
Immature Granulocytes: 2 %
Lymphocytes Relative: 8 %
Lymphs Abs: 1.3 K/uL (ref 0.7–4.0)
MCH: 26 pg (ref 26.0–34.0)
MCHC: 31.7 g/dL (ref 30.0–36.0)
MCV: 82.2 fL (ref 80.0–100.0)
Monocytes Absolute: 1 K/uL (ref 0.1–1.0)
Monocytes Relative: 6 %
Neutro Abs: 12.6 K/uL — ABNORMAL HIGH (ref 1.7–7.7)
Neutrophils Relative %: 84 %
Platelets: 475 K/uL — ABNORMAL HIGH (ref 150–400)
RBC: 3.65 MIL/uL — ABNORMAL LOW (ref 3.87–5.11)
RDW: 14.6 % (ref 11.5–15.5)
WBC: 15.1 K/uL — ABNORMAL HIGH (ref 4.0–10.5)
nRBC: 0 % (ref 0.0–0.2)

## 2024-07-15 LAB — COMPREHENSIVE METABOLIC PANEL WITH GFR
ALT: 15 U/L (ref 0–44)
AST: 10 U/L — ABNORMAL LOW (ref 15–41)
Albumin: 2.3 g/dL — ABNORMAL LOW (ref 3.5–5.0)
Alkaline Phosphatase: 73 U/L (ref 38–126)
Anion gap: 7 (ref 5–15)
BUN: 17 mg/dL (ref 8–23)
CO2: 25 mmol/L (ref 22–32)
Calcium: 8.5 mg/dL — ABNORMAL LOW (ref 8.9–10.3)
Chloride: 100 mmol/L (ref 98–111)
Creatinine, Ser: 0.68 mg/dL (ref 0.44–1.00)
GFR, Estimated: >60 mL/min (ref >60)
Glucose, Bld: 186 mg/dL — ABNORMAL HIGH (ref 70–99)
Potassium: 4.3 mmol/L (ref 3.5–5.1)
Sodium: 132 mmol/L — ABNORMAL LOW (ref 135–145)
Total Bilirubin: 0.3 mg/dL (ref 0.0–1.2)
Total Protein: 5.6 g/dL — ABNORMAL LOW (ref 6.5–8.1)

## 2024-07-15 LAB — PHOSPHORUS: Phosphorus: 3.2 mg/dL (ref 2.5–4.6)

## 2024-07-15 LAB — MAGNESIUM: Magnesium: 2.3 mg/dL (ref 1.7–2.4)

## 2024-07-15 LAB — OSMOLALITY: Osmolality: 289 mosm/kg (ref 275–295)

## 2024-07-15 LAB — C-REACTIVE PROTEIN: CRP: 1.4 mg/dL — ABNORMAL HIGH (ref <1.0)

## 2024-07-15 MED ORDER — IRON SUCROSE 300 MG IVPB - SIMPLE MED
300.0000 mg | Freq: Once | Status: AC
  Administered 2024-07-15: 300 mg via INTRAVENOUS
  Filled 2024-07-15: qty 300

## 2024-07-15 MED ORDER — GLUCERNA SHAKE PO LIQD
237.0000 mL | Freq: Three times a day (TID) | ORAL | Status: DC
  Administered 2024-07-15 – 2024-07-19 (×11): 237 mL via ORAL
  Filled 2024-07-15 (×15): qty 237

## 2024-07-15 MED ORDER — FERROUS SULFATE 325 (65 FE) MG PO TABS: 325.0000 mg | ORAL_TABLET | Freq: Every day | ORAL | Status: DC

## 2024-07-15 MED ORDER — VITAMIN D (ERGOCALCIFEROL) 1.25 MG (50000 UNIT) PO CAPS
50000.0000 [IU] | ORAL_CAPSULE | ORAL | Status: DC
  Administered 2024-07-15: 50000 [IU] via ORAL
  Filled 2024-07-15: qty 1

## 2024-07-15 MED ORDER — PANTOPRAZOLE SODIUM 40 MG PO TBEC
40.0000 mg | DELAYED_RELEASE_TABLET | Freq: Every day | ORAL | Status: DC
  Administered 2024-07-15 – 2024-07-19 (×5): 40 mg via ORAL
  Filled 2024-07-15 (×5): qty 1

## 2024-07-15 NOTE — Progress Notes (Addendum)
 Subjective: Patient been in the hospital since 07/11/2024 with worsening symptoms of her colitis with abdominal cramping bloody mucoid stools with fecal urgency and occasional fecal incontinence and no response to Inflectra  of since July of this year.  Patient denies having abdomen joint pains, skin rashes, aphthous ulcers or ocular complaints.  She is very frustrated with her symptoms. She also been having some active acid regurgitation with nausea and vomiting.  Labs done in the office revealed fecal calprotectin of over 8000 and a CRP of 60.  She seems to be doing slightly better on steroids in the hospital her albumin is down to 2.3 hemoglobin of 9.5 g/dL and a platelet count of 4 75K.  Also received antibiotics for recent tooth infection about a month ago and if she felt her symptoms worsen when she took the antibiotics.  Stool negative for C. difficile toxin. The GI panel for an infection organisms by PCR was negative.  Objective: Vital signs in last 24 hours: Temp:  [98.2 F (36.8 C)-98.5 F (36.9 C)] 98.2 F (36.8 C) (08/25 0559) Pulse Rate:  [72] 72 (08/25 0559) Resp:  [18] 18 (08/25 0559) BP: (119-128)/(75-83) 119/75 (08/25 0559) SpO2:  [96 %-97 %] 97 % (08/25 0559) Weight:  [80.8 kg] 80.8 kg (08/25 0559) Last BM Date : 07/14/24  Intake/Output from previous day: 08/24 0701 - 08/25 0700 In: 720 [P.O.:720] Out: -  Intake/Output this shift: No intake/output data recorded.  General appearance: alert, cooperative, fatigued, no distress, and morbidly obese Resp: clear to auscultation bilaterally Cardio: regular rate and rhythm, S1, S2 normal, no murmur, click, rub or gallop GI: soft, non-tender; bowel sounds normal; no masses,  no organomegaly  Lab Results: Recent Labs    07/13/24 0743 07/14/24 0604 07/15/24 0614  WBC 13.5* 15.4* 15.1*  HGB 9.4* 10.0* 9.5*  HCT 30.9* 32.9* 30.0*  PLT 537* 555* 475*   BMET Recent Labs    07/13/24 0743 07/14/24 0604 07/15/24 0614  NA 136  136 132*  K 3.6 4.1 4.3  CL 100 97* 100  CO2 27 29 25   GLUCOSE 77 77 186*  BUN 11 9 17   CREATININE 0.72 0.75 0.68  CALCIUM  8.9 9.0 8.5*   LFT Recent Labs    07/15/24 0614  PROT 5.6*  ALBUMIN 2.3*  AST 10*  ALT 15  ALKPHOS 73  BILITOT 0.3   C-Diff Recent Labs    07/14/24 1724  CDIFFTOX NEGATIVE  Studies/Results: Medications: I have reviewed the patient's current medications. Prior to Admission:  Medications Prior to Admission  Medication Sig Dispense Refill Last Dose/Taking   acetaminophen  (TYLENOL ) 500 MG tablet Take 500 mg by mouth every 6 (six) hours as needed for moderate pain (pain score 4-6).   Unknown   atorvastatin  (LIPITOR) 40 MG tablet Take 1 tablet (40 mg total) by mouth daily. 90 tablet 0 07/09/2024   FEROSUL 325 (65 Fe) MG tablet Take 325 mg by mouth every other day.   Past Month   losartan  (COZAAR ) 50 MG tablet Take 50 mg by mouth daily.   07/10/2024 Morning   ondansetron  (ZOFRAN -ODT) 4 MG disintegrating tablet Take 1 tablet (4 mg total) by mouth every 8 (eight) hours as needed for nausea or vomiting. 20 tablet 0 07/10/2024 Evening   oxyCODONE  (ROXICODONE ) 5 MG immediate release tablet Take 1 tablet (5 mg total) by mouth every 4 (four) hours as needed for severe pain (pain score 7-10). 15 tablet 0 07/09/2024   Ascorbic Acid  (C EXTRA STRENGTH) 1000 MG  TABS Take 1 tablet by mouth every other day. (Patient not taking: Reported on 07/11/2024)   Not Taking   azithromycin  (ZITHROMAX ) 250 MG tablet Take 250 mg by mouth as directed. (Patient not taking: Reported on 07/11/2024)   Not Taking   CALCIUM -VITAMIN D  PO Take 2 tablets by mouth daily. (Patient not taking: Reported on 07/11/2024)   Not Taking   Cholecalciferol 1.25 MG (50000 UT) capsule Take 50,000 Units by mouth once a week. (Patient not taking: Reported on 07/11/2024)   Not Taking   clindamycin  (CLEOCIN ) 300 MG capsule Take 300 mg by mouth 3 (three) times daily. (Patient not taking: Reported on 07/11/2024)   Not Taking    Glucosamine HCl-MSM (GLUCOSAMINE-MSM PO) Take 1 tablet by mouth daily. (Patient not taking: Reported on 07/11/2024)   Not Taking   INFLECTRA  100 MG SOLR Inject into the vein. (Patient not taking: Reported on 07/11/2024)   Not Taking   mesalamine  (CANASA ) 1000 MG suppository Place 1,000 mg rectally at bedtime. (Patient not taking: Reported on 07/11/2024)   Not Taking   mesalamine  (LIALDA ) 1.2 g EC tablet Take 2.4 g by mouth daily. (Patient not taking: Reported on 07/11/2024)   Not Taking   methylPREDNISolone  (MEDROL  DOSEPAK) 4 MG TBPK tablet Take by mouth as directed. (Patient not taking: Reported on 07/11/2024)   Not Taking   Multiple Vitamins-Minerals (MULTIVITAMIN WITH MINERALS) tablet Take 1 tablet by mouth daily. Women 50+ (Patient not taking: Reported on 07/11/2024)   Not Taking   Multiple Vitamins-Minerals (PRESERVISION AREDS 2) CAPS Take 1 tablet by mouth daily. (Patient not taking: Reported on 07/11/2024)   Not Taking   nystatin (MYCOSTATIN) 100000 UNIT/ML suspension Take 4 mLs by mouth 4 (four) times daily. (Patient not taking: Reported on 07/11/2024)   Not Taking   Omega-3 Fatty Acids (FISH OIL PO) Take 1,400 mg by mouth daily. Omega3 980 mg (Patient not taking: Reported on 07/11/2024)   Not Taking   Turmeric 500 MG CAPS Take 1,500 mg by mouth in the morning, at noon, and at bedtime.  (Patient not taking: Reported on 07/11/2024)   Not Taking   Scheduled:  atorvastatin   40 mg Oral Daily   feeding supplement (GLUCERNA SHAKE)  237 mL Oral TID BM   [START ON 07/16/2024] ferrous sulfate   325 mg Oral Q1200   insulin  aspart  0-9 Units Subcutaneous Q6H   methylPREDNISolone  (SOLU-MEDROL ) injection  20 mg Intravenous Q12H   Vitamin D  (Ergocalciferol )  50,000 Units Oral Q7 days   Continuous: PRN:acetaminophen  **OR** acetaminophen , alum & mag hydroxide-simeth, HYDROmorphone  (DILAUDID ) injection, melatonin, naLOXone  (NARCAN )  injection, ondansetron  (ZOFRAN ) IV  Assessment/Plan: 1) Refractory left-sided  colitis on Solu-Medrol -with worsening symptoms in spite of treatment with Inflectra  x 3.  Plans are to stabilize the patient with steroids and start her on Rinvoq  on an outpatient basis. 2) Active acid reflux with nausea and vomiting-will start the patient on PPI today. 3) Chronic iron  deficiency anemia on ferrous sulfate . 4) AODM. 5) Hypertension. 6) Hyperlipidemia. 7) Vitamin D  deficiency. 8) Hyponatremia.  LOS: 4 days   [name] 07/15/2024, 1:57 PM

## 2024-07-15 NOTE — Progress Notes (Signed)
 [facility]  PROGRESS NOTE  Nancy Eaton FMW:[id number] DOB: 11-04-1956 DOA: 07/10/2024 PCP: Christopher Arland PARAS, PA-C   Brief HPI:    68 yo F admitted for colitis. 68 y.o. female with medical history significant for ulcerative colitis, prediabetes, essential pretension, hyperlipidemia, chronic iron  deficiency anemia with baseline hemoglobin 9-12, who is admitted to [facility] on 07/10/2024 with persistent colitis after presenting from home to [facility] ED complaining of abdominal pain. WBC has been rising on ceftriaxone  and flagyl  (possible it is due to the IV steroids, however, CRP also slightly elevated at 4.1 - 4.6). Pt did not report feeling much better with these 2 antibx. Thus, IV meropenem  was started on 07/12/2024. GI panel in progress as of 11:31 AM 07/12/2024.     Assessment/Plan:   # Colitis  -Likely in setting of ulcerative colitis flare -Improving with Solu-Medrol , continue Solu-Medrol  60 mg IV twice daily -s/p IV meropenem , DC'd antibiotics as per GI C. difficile and GI pathogen negative -Continue clear liquid diet GI following; fecal calprotectin stool test to assess inflammation and recommending soft diet as tolerated.  Thinking potential switch to Rinvoq  pending test results and insurance approval    # Diabetes mellitus type 2 - A1c is 6.7 - Novolog  SS q6 hr  -CBG well-controlled   # HLD - Lipitor 40 mg PO daily    # HTN - Monitor    # Chronic iron  deficiency anemia  B12 and folate within normal range - Ferrous sulfate  325 mg PO every other day  8/25 Venofer  300 mg IV x 1 dose given  # Vitamin D  deficiency: started vitamin D  50,000 units p.o. weekly, follow with PCP to repeat vitamin D  level after 3 to 6 months.  # Hyponatremia Na 138 Check serum osmolarity Monitor sodium level daily    Medications  atorvastatin   40 mg Oral Daily   feeding supplement (GLUCERNA SHAKE)  237 mL Oral TID BM   [START ON 07/16/2024] ferrous sulfate   325 mg Oral  Q1200   insulin  aspart  0-9 Units Subcutaneous Q6H   methylPREDNISolone  (SOLU-MEDROL ) injection  20 mg Intravenous Q12H   Vitamin D  (Ergocalciferol )  50,000 Units Oral Q7 days     Data Reviewed:   CBG:  Recent Labs  Lab 07/14/24 1155 07/14/24 1819 07/15/24 0002 07/15/24 0600 07/15/24 1210  GLUCAP 92 194* 192* 171* 175*    SpO2: 95 %    Vitals:   07/14/24 1158 07/14/24 2246 07/15/24 0559 07/15/24 1409  BP: 116/69 128/83 119/75 118/66  Pulse: 67 72 72 70  Resp: 20 18 18 15   Temp: 98.3 F (36.8 C) 98.5 F (36.9 C) 98.2 F (36.8 C) 98.9 F (37.2 C)  TempSrc:    Oral  SpO2: 96% 96% 97% 95%  Weight:   80.8 kg   Height:          Data Reviewed:  Basic Metabolic Panel: Recent Labs  Lab 07/11/24 0524 07/12/24 0540 07/13/24 0743 07/14/24 0604 07/15/24 0614  NA 134* 135 136 136 132*  K 3.8 3.4* 3.6 4.1 4.3  CL 100 100 100 97* 100  CO2 26 27 27 29 25   GLUCOSE 85 82 77 77 186*  BUN 14 12 11 9 17   CREATININE 0.71 0.70 0.72 0.75 0.68  CALCIUM  8.6* 8.7* 8.9 9.0 8.5*  MG 2.3 2.2 2.3 2.4 2.3  PHOS  --  2.5 2.6 2.6 3.2    CBC: Recent Labs  Lab 07/11/24 0524 07/12/24 0540 07/13/24 0743 07/14/24 0604  07/15/24 0614  WBC 13.7* 14.8* 13.5* 15.4* 15.1*  NEUTROABS 9.0*  --   --   --  12.6*  HGB 9.0* 8.9* 9.4* 10.0* 9.5*  HCT 28.9* 29.3* 30.9* 32.9* 30.0*  MCV 83.0 83.2 83.7 83.5 82.2  PLT 529* 535* 537* 555* 475*    LFT Recent Labs  Lab 07/11/24 0524 07/12/24 0540 07/13/24 0743 07/14/24 0604 07/15/24 0614  AST 12* 8* 9* 9* 10*  ALT 13 14 13 12 15   ALKPHOS 60 62 63 71 73  BILITOT <0.2 0.2 0.5 0.3 0.3  PROT 5.9* 5.7* 6.0* 6.1* 5.6*  ALBUMIN 2.3* 2.2* 2.3* 2.5* 2.3*     Antibiotics: Anti-infectives (From admission, onward)    Start     Dose/Rate Route Frequency Ordered Stop   07/12/24 1400  meropenem  (MERREM ) 1 g in sodium chloride  0.9 % 100 mL IVPB  Status:  Discontinued        1 g 200 mL/hr over 30 Minutes Intravenous Every 8 hours 07/12/24  1051 07/14/24 1036   07/11/24 0145  cefTRIAXone  (ROCEPHIN ) 2 g in sodium chloride  0.9 % 100 mL IVPB  Status:  Discontinued        2 g 200 mL/hr over 30 Minutes Intravenous Every 24 hours 07/11/24 0139 07/12/24 1051   07/11/24 0145  metroNIDAZOLE  (FLAGYL ) IVPB 500 mg  Status:  Discontinued        500 mg 100 mL/hr over 60 Minutes Intravenous Every 12 hours 07/11/24 0139 07/12/24 1051        DVT prophylaxis: SCDs  Code Status: Full code  Family Communication: No family at bedside   CONSULTS    Subjective   Patient was seen and examined at bedside in the morning time. Patient had cramping pain off and on, currently asymptomatic.  Patient had nausea but no vomiting today.  Patient is moving bowels, had 5 episodes today which were loose. Denied any chest pain or palpitations, no shortness of breath.   Objective    Physical Examination:  General-appears in no acute distress Heart-S1-S2, regular, no murmur auscultated Lungs-clear to auscultation bilaterally, no wheezing or crackles auscultated Abdomen-soft, mild generalized tenderness to palpation Extremities-no edema in the lower extremities Neuro-alert, oriented x3, no focal deficit noted  Status is: Inpatient:      Elvan Sor, MD   [facility] If 7PM-7AM, please contact night-coverage at [email or web address], Office  [phone]   07/15/2024, 2:24 PM  LOS: 4 days

## 2024-07-15 NOTE — Plan of Care (Signed)
 Problem: Education: Goal: Ability to describe self-care measures that may prevent or decrease complications (Diabetes Survival Skills Education) will improve 07/15/2024 0424 by Joyner-Little, Aidan Moten, RN Outcome: Progressing 07/15/2024 0424 by Joyner-Little, Bathsheba Durrett, RN Outcome: Not Progressing Goal: Individualized Educational Video(s) 07/15/2024 0424 by Joyner-Little, Tia Hieronymus, RN Outcome: Progressing 07/15/2024 0424 by Joyner-Little, Krissi Willaims, RN Outcome: Not Progressing   Problem: Coping: Goal: Ability to adjust to condition or change in health will improve 07/15/2024 0424 by Nohemi Cornfield, RN Outcome: Progressing 07/15/2024 0424 by Nohemi Cornfield, RN Outcome: Not Progressing   Problem: Fluid Volume: Goal: Ability to maintain a balanced intake and output will improve 07/15/2024 0424 by Nohemi Cornfield, RN Outcome: Progressing 07/15/2024 0424 by Nohemi Cornfield, RN Outcome: Not Progressing   Problem: Health Behavior/Discharge Planning: Goal: Ability to identify and utilize available resources and services will improve 07/15/2024 0424 by Nohemi Cornfield, RN Outcome: Progressing 07/15/2024 0424 by Nohemi Cornfield, RN Outcome: Not Progressing Goal: Ability to manage health-related needs will improve 07/15/2024 0424 by Nohemi Cornfield, RN Outcome: Progressing 07/15/2024 0424 by Nohemi Cornfield, RN Outcome: Not Progressing   Problem: Metabolic: Goal: Ability to maintain appropriate glucose levels will improve 07/15/2024 0424 by Nohemi Cornfield, RN Outcome: Progressing 07/15/2024 0424 by Nohemi Cornfield, RN Outcome: Not Progressing   Problem: Nutritional: Goal: Maintenance of adequate nutrition will improve 07/15/2024 0424 by Nohemi Cornfield, RN Outcome: Progressing 07/15/2024 0424 by Nohemi Cornfield, RN Outcome: Not Progressing Goal: Progress toward achieving an optimal weight will  improve 07/15/2024 0424 by Nohemi Cornfield, RN Outcome: Progressing 07/15/2024 0424 by Nohemi Cornfield, RN Outcome: Not Progressing   Problem: Skin Integrity: Goal: Risk for impaired skin integrity will decrease 07/15/2024 0424 by Nohemi Cornfield, RN Outcome: Progressing 07/15/2024 0424 by Nohemi Cornfield, RN Outcome: Not Progressing   Problem: Tissue Perfusion: Goal: Adequacy of tissue perfusion will improve 07/15/2024 0424 by Joyner-Little, Blake Goya, RN Outcome: Progressing 07/15/2024 0424 by Nohemi Cornfield, RN Outcome: Not Progressing   Problem: Education: Goal: Knowledge of General Education information will improve Description: Including pain rating scale, medication(s)/side effects and non-pharmacologic comfort measures 07/15/2024 0424 by Joyner-Little, Merrit Waugh, RN Outcome: Progressing 07/15/2024 0424 by Nohemi Cornfield, RN Outcome: Not Progressing   Problem: Health Behavior/Discharge Planning: Goal: Ability to manage health-related needs will improve 07/15/2024 0424 by Nohemi Cornfield, RN Outcome: Progressing 07/15/2024 0424 by Nohemi Cornfield, RN Outcome: Not Progressing   Problem: Clinical Measurements: Goal: Ability to maintain clinical measurements within normal limits will improve 07/15/2024 0424 by Nohemi Cornfield, RN Outcome: Progressing 07/15/2024 0424 by Nohemi Cornfield, RN Outcome: Not Progressing Goal: Will remain free from infection 07/15/2024 0424 by Nohemi Cornfield, RN Outcome: Progressing 07/15/2024 0424 by Joyner-Little, Deklin Bieler, RN Outcome: Not Progressing Goal: Diagnostic test results will improve 07/15/2024 0424 by Joyner-Little, Marilouise Densmore, RN Outcome: Progressing 07/15/2024 0424 by Joyner-Little, Selisa Tensley, RN Outcome: Not Progressing Goal: Respiratory complications will improve 07/15/2024 0424 by Joyner-Little, Hetal Proano, RN Outcome: Progressing 07/15/2024 0424 by Joyner-Little,  Kaidon Kinker, RN Outcome: Not Progressing Goal: Cardiovascular complication will be avoided 07/15/2024 0424 by Joyner-Little, Zollie Clemence, RN Outcome: Progressing 07/15/2024 0424 by Joyner-Little, Nishat Livingston, RN Outcome: Not Progressing   Problem: Activity: Goal: Risk for activity intolerance will decrease 07/15/2024 0424 by Joyner-Little, Twisha Vanpelt, RN Outcome: Progressing 07/15/2024 0424 by Joyner-Little, Cecile Guevara, RN Outcome: Not Progressing   Problem: Nutrition: Goal: Adequate nutrition will be maintained 07/15/2024 0424 by Nohemi Cornfield, RN Outcome: Progressing 07/15/2024 0424 by Nohemi Cornfield, RN Outcome: Not Progressing   Problem: Coping: Goal: Level of anxiety will decrease 07/15/2024 0424 by Nohemi Cornfield, RN Outcome: Progressing 07/15/2024 0424 by  Joyner-Little, Christina Gintz, RN Outcome: Not Progressing   Problem: Elimination: Goal: Will not experience complications related to bowel motility 07/15/2024 0424 by Joyner-Little, Masiya Claassen, RN Outcome: Progressing 07/15/2024 0424 by Joyner-Little, Kelechi Orgeron, RN Outcome: Not Progressing Goal: Will not experience complications related to urinary retention 07/15/2024 0424 by Nohemi Cornfield, RN Outcome: Progressing 07/15/2024 0424 by Nohemi Cornfield, RN Outcome: Not Progressing   Problem: Pain Managment: Goal: General experience of comfort will improve and/or be controlled 07/15/2024 0424 by Joyner-Little, Peyten Weare, RN Outcome: Progressing 07/15/2024 0424 by Nohemi Cornfield, RN Outcome: Not Progressing   Problem: Safety: Goal: Ability to remain free from injury will improve 07/15/2024 0424 by Nohemi Cornfield, RN Outcome: Progressing 07/15/2024 0424 by Nohemi Cornfield, RN Outcome: Not Progressing   Problem: Skin Integrity: Goal: Risk for impaired skin integrity will decrease 07/15/2024 0424 by Nohemi Cornfield, RN Outcome: Progressing 07/15/2024 0424 by Joyner-Little, Melinda Pottinger,  RN Outcome: Not Progressing

## 2024-07-15 NOTE — Plan of Care (Signed)
  Problem: Education: Goal: Individualized Educational Video(s) Outcome: Progressing   Problem: Fluid Volume: Goal: Ability to maintain a balanced intake and output will improve Outcome: Progressing   Problem: Metabolic: Goal: Ability to maintain appropriate glucose levels will improve Outcome: Progressing   Problem: Skin Integrity: Goal: Risk for impaired skin integrity will decrease Outcome: Progressing   Problem: Education: Goal: Knowledge of General Education information will improve Description: Including pain rating scale, medication(s)/side effects and non-pharmacologic comfort measures Outcome: Progressing   Problem: Clinical Measurements: Goal: Will remain free from infection Outcome: Progressing Goal: Diagnostic test results will improve Outcome: Progressing   Problem: Nutrition: Goal: Adequate nutrition will be maintained Outcome: Progressing   Problem: Elimination: Goal: Will not experience complications related to urinary retention Outcome: Progressing   Problem: Pain Managment: Goal: General experience of comfort will improve and/or be controlled Outcome: Progressing   Problem: Skin Integrity: Goal: Risk for impaired skin integrity will decrease Outcome: Progressing

## 2024-07-16 DIAGNOSIS — K529 Noninfective gastroenteritis and colitis, unspecified: Secondary | ICD-10-CM | POA: Diagnosis not present

## 2024-07-16 LAB — BASIC METABOLIC PANEL WITH GFR
Anion gap: 9 (ref 5–15)
BUN: 17 mg/dL (ref 8–23)
CO2: 27 mmol/L (ref 22–32)
Calcium: 8.8 mg/dL — ABNORMAL LOW (ref 8.9–10.3)
Chloride: 98 mmol/L (ref 98–111)
Creatinine, Ser: 0.65 mg/dL (ref 0.44–1.00)
GFR, Estimated: >60 mL/min (ref >60)
Glucose, Bld: 142 mg/dL — ABNORMAL HIGH (ref 70–99)
Potassium: 4.4 mmol/L (ref 3.5–5.1)
Sodium: 134 mmol/L — ABNORMAL LOW (ref 135–145)

## 2024-07-16 LAB — CALPROTECTIN, FECAL: Calprotectin, Fecal: >8000 ug/g — ABNORMAL HIGH (ref 0–120)

## 2024-07-16 LAB — CBC
HCT: 29.4 % — ABNORMAL LOW (ref 36.0–46.0)
Hemoglobin: 9.1 g/dL — ABNORMAL LOW (ref 12.0–15.0)
MCH: 25.5 pg — ABNORMAL LOW (ref 26.0–34.0)
MCHC: 31 g/dL (ref 30.0–36.0)
MCV: 82.4 fL (ref 80.0–100.0)
Platelets: 469 K/uL — ABNORMAL HIGH (ref 150–400)
RBC: 3.57 MIL/uL — ABNORMAL LOW (ref 3.87–5.11)
RDW: 14.8 % (ref 11.5–15.5)
WBC: 14.8 K/uL — ABNORMAL HIGH (ref 4.0–10.5)
nRBC: 0.3 % — ABNORMAL HIGH (ref 0.0–0.2)

## 2024-07-16 LAB — GLUCOSE, CAPILLARY
Glucose-Capillary: 141 mg/dL — ABNORMAL HIGH (ref 70–99)
Glucose-Capillary: 132 mg/dL — ABNORMAL HIGH (ref 70–99)
Glucose-Capillary: 162 mg/dL — ABNORMAL HIGH (ref 70–99)

## 2024-07-16 LAB — LIPID PANEL
Cholesterol: 112 mg/dL (ref 0–200)
HDL: 50 mg/dL (ref >40)
LDL Cholesterol: 45 mg/dL (ref 0–99)
Total CHOL/HDL Ratio: 2.2 ratio
Triglycerides: 84 mg/dL (ref <150)
VLDL: 17 mg/dL (ref 0–40)

## 2024-07-16 LAB — MAGNESIUM: Magnesium: 2.2 mg/dL (ref 1.7–2.4)

## 2024-07-16 LAB — PHOSPHORUS: Phosphorus: 2.7 mg/dL (ref 2.5–4.6)

## 2024-07-16 MED ORDER — FERROUS SULFATE 325 (65 FE) MG PO TABS
325.0000 mg | ORAL_TABLET | Freq: Two times a day (BID) | ORAL | Status: DC
  Administered 2024-07-16 – 2024-07-19 (×6): 325 mg via ORAL
  Filled 2024-07-16 (×6): qty 1

## 2024-07-16 MED ORDER — VITAMIN C 500 MG PO TABS
500.0000 mg | ORAL_TABLET | Freq: Every day | ORAL | Status: DC
  Administered 2024-07-16 – 2024-07-19 (×4): 500 mg via ORAL
  Filled 2024-07-16 (×4): qty 1

## 2024-07-16 MED ORDER — UPADACITINIB ER 45 MG PO TB24
45.0000 mg | ORAL_TABLET | Freq: Every day | ORAL | Status: DC
  Administered 2024-07-17 – 2024-07-19 (×3): 45 mg via ORAL
  Filled 2024-07-16 (×4): qty 1

## 2024-07-16 NOTE — Progress Notes (Signed)
 [facility]  PROGRESS NOTE  Nancy Eaton FMW:[id number] DOB: 1956-08-16 DOA: 07/10/2024 PCP: Christopher Arland PARAS, PA-C   Brief HPI:    68 yo F admitted for colitis. 67 y.o. female with medical history significant for ulcerative colitis, prediabetes, essential pretension, hyperlipidemia, chronic iron  deficiency anemia with baseline hemoglobin 9-12, who is admitted to [facility] on 07/10/2024 with persistent colitis after presenting from home to [facility] ED complaining of abdominal pain. WBC has been rising on ceftriaxone  and flagyl  (possible it is due to the IV steroids, however, CRP also slightly elevated at 4.1 - 4.6). Pt did not report feeling much better with these 2 antibx. Thus, IV meropenem  was started on 07/12/2024. GI panel in progress as of 11:31 AM 07/12/2024.     Assessment/Plan:   # Acute Colitis  -Likely in setting of ulcerative colitis flare -Improving with Solu-Medrol , continue Solu-Medrol  20 mg IV twice daily -s/p IV meropenem , DC'd antibiotics as per GI C. difficile and GI pathogen negative -Continue clear liquid diet GI following; fecal calprotectin stool test to assess inflammation and recommending soft diet as tolerated.  8/26 GI stared Upadacitinib  starting from 8/27   # Diabetes mellitus type 2 - A1c is 6.7 - Novolog  SS q6 hr  - Monitor CBG   # HLD: Lipitor 40 mg PO daily    # HTN, well-controlled without medication - Monitor    # Chronic iron  deficiency anemia  B12 and folate within normal range Increased ferrous sulfate  325 mg PO BID  8/25 Venofer  300 mg IV x 1 dose given  # Vitamin D  deficiency: started vitamin D  50,000 units p.o. weekly, follow with PCP to repeat vitamin D  level after 3 to 6 months.  # Hyponatremia, possible due to nutritional deficiency Na 132>>134 S. osmolarity 289 wnl Monitor sodium level daily    Medications  ascorbic acid   500 mg Oral Daily   atorvastatin   40 mg Oral Daily   feeding supplement (GLUCERNA  SHAKE)  237 mL Oral TID BM   ferrous sulfate   325 mg Oral BID WC   insulin  aspart  0-9 Units Subcutaneous Q6H   methylPREDNISolone  (SOLU-MEDROL ) injection  20 mg Intravenous Q12H   pantoprazole   40 mg Oral Daily   [START ON 07/17/2024] Upadacitinib  ER  45 mg Oral Daily   Vitamin D  (Ergocalciferol )  50,000 Units Oral Q7 days     Data Reviewed:   CBG:  Recent Labs  Lab 07/15/24 1210 07/15/24 1803 07/15/24 2327 07/16/24 0528 07/16/24 1142  GLUCAP 175* 168* 197* 141* 132*    SpO2: 96 %    Vitals:   07/15/24 1928 07/16/24 0500 07/16/24 0527 07/16/24 1143  BP: 129/66  127/70 122/61  Pulse: 75  62 74  Resp: 16  16 16   Temp: 98.4 F (36.9 C)  98.2 F (36.8 C) 98.2 F (36.8 C)  TempSrc: Oral  Oral   SpO2: 95%  95% 96%  Weight:  83.1 kg    Height:          Data Reviewed:  Basic Metabolic Panel: Recent Labs  Lab 07/12/24 0540 07/13/24 0743 07/14/24 0604 07/15/24 0614 07/16/24 0525  NA 135 136 136 132* 134*  K 3.4* 3.6 4.1 4.3 4.4  CL 100 100 97* 100 98  CO2 27 27 29 25 27   GLUCOSE 82 77 77 186* 142*  BUN 12 11 9 17 17   CREATININE 0.70 0.72 0.75 0.68 0.65  CALCIUM  8.7* 8.9 9.0 8.5* 8.8*  MG 2.2 2.3 2.4  2.3 2.2  PHOS 2.5 2.6 2.6 3.2 2.7    CBC: Recent Labs  Lab 07/11/24 0524 07/12/24 0540 07/13/24 0743 07/14/24 0604 07/15/24 0614 07/16/24 0525  WBC 13.7* 14.8* 13.5* 15.4* 15.1* 14.8*  NEUTROABS 9.0*  --   --   --  12.6*  --   HGB 9.0* 8.9* 9.4* 10.0* 9.5* 9.1*  HCT 28.9* 29.3* 30.9* 32.9* 30.0* 29.4*  MCV 83.0 83.2 83.7 83.5 82.2 82.4  PLT 529* 535* 537* 555* 475* 469*    LFT Recent Labs  Lab 07/11/24 0524 07/12/24 0540 07/13/24 0743 07/14/24 0604 07/15/24 0614  AST 12* 8* 9* 9* 10*  ALT 13 14 13 12 15   ALKPHOS 60 62 63 71 73  BILITOT <0.2 0.2 0.5 0.3 0.3  PROT 5.9* 5.7* 6.0* 6.1* 5.6*  ALBUMIN 2.3* 2.2* 2.3* 2.5* 2.3*     Antibiotics: Anti-infectives (From admission, onward)    Start     Dose/Rate Route Frequency Ordered Stop    07/12/24 1400  meropenem  (MERREM ) 1 g in sodium chloride  0.9 % 100 mL IVPB  Status:  Discontinued        1 g 200 mL/hr over 30 Minutes Intravenous Every 8 hours 07/12/24 1051 07/14/24 1036   07/11/24 0145  cefTRIAXone  (ROCEPHIN ) 2 g in sodium chloride  0.9 % 100 mL IVPB  Status:  Discontinued        2 g 200 mL/hr over 30 Minutes Intravenous Every 24 hours 07/11/24 0139 07/12/24 1051   07/11/24 0145  metroNIDAZOLE  (FLAGYL ) IVPB 500 mg  Status:  Discontinued        500 mg 100 mL/hr over 60 Minutes Intravenous Every 12 hours 07/11/24 0139 07/12/24 1051        DVT prophylaxis: SCDs  Code Status: Full code  Family Communication: No family at bedside   CONSULTS    Subjective   Patient was seen and examined at bedside in the morning time. Patient was complaining of bloody stool to 5 episodes today, small amount.  Off-and-on crampy abdominal pain during bowel movement, no nausea vomiting.    Objective    Physical Examination:  General-appears in no acute distress Heart-S1-S2, regular, no murmur auscultated Lungs-clear to auscultation bilaterally, no wheezing or crackles auscultated Abdomen-soft, mild generalized tenderness to palpation Extremities-no edema in the lower extremities Neuro-alert, oriented x3, no focal deficit noted  Status is: Inpatient:      Elvan Sor, MD   [facility] If 7PM-7AM, please contact night-coverage at [email or web address], Office  [phone]   07/16/2024, 1:28 PM  LOS: 5 days

## 2024-07-16 NOTE — Plan of Care (Signed)

## 2024-07-16 NOTE — Plan of Care (Signed)
  Problem: Education: Goal: Ability to describe self-care measures that may prevent or decrease complications (Diabetes Survival Skills Education) will improve Outcome: Progressing Goal: Individualized Educational Video(s) Outcome: Progressing   Problem: Fluid Volume: Goal: Ability to maintain a balanced intake and output will improve Outcome: Progressing   Problem: Health Behavior/Discharge Planning: Goal: Ability to manage health-related needs will improve Outcome: Progressing   Problem: Skin Integrity: Goal: Risk for impaired skin integrity will decrease Outcome: Progressing   Problem: Health Behavior/Discharge Planning: Goal: Ability to manage health-related needs will improve Outcome: Progressing   Problem: Clinical Measurements: Goal: Ability to maintain clinical measurements within normal limits will improve Outcome: Progressing Goal: Respiratory complications will improve Outcome: Progressing Goal: Cardiovascular complication will be avoided Outcome: Progressing   Problem: Nutrition: Goal: Adequate nutrition will be maintained Outcome: Progressing   Problem: Elimination: Goal: Will not experience complications related to urinary retention Outcome: Progressing   Problem: Pain Managment: Goal: General experience of comfort will improve and/or be controlled Outcome: Progressing   Problem: Skin Integrity: Goal: Risk for impaired skin integrity will decrease Outcome: Progressing

## 2024-07-16 NOTE — TOC Progression Note (Signed)
 Transition of Care [facility]) - Progression Note   Patient Details  Name: Nancy Eaton MRN: [id number] Date of Birth: 1956-04-02  Transition of Care [facility]) CM/SW Contact  Duwaine GORMAN Aran, LCSW Phone Number: 07/16/2024, 1:58 PM  Clinical Narrative: No discharge needs identified at this time, but care management will continue to follow.  Expected Discharge Plan: Home/Self Care Barriers to Discharge: Continued Medical Work up  Expected Discharge Plan and Services Discharge Planning Services: CM Consult Living arrangements for the past 2 months: Single Family Home  Social Drivers of Health (SDOH) Interventions SDOH Screenings   Food Insecurity: No Food Insecurity (07/12/2024)  Housing: Unknown (07/12/2024)  Transportation Needs: No Transportation Needs (07/12/2024)  Utilities: Not At Risk (07/12/2024)  Social Connections: Unknown (07/12/2024)  Tobacco Use: Low Risk  (07/11/2024)   Readmission Risk Interventions    07/16/2024    1:58 PM  Readmission Risk Prevention Plan  Transportation Screening Complete  HRI or Home Care Consult Complete  Social Work Consult for Recovery Care Planning/Counseling Complete  Palliative Care Screening Not Applicable  Medication Review Oceanographer) Complete

## 2024-07-16 NOTE — Progress Notes (Signed)
 Subjective: Some improvement.  Her pain is well-controlled.  Objective: Vital signs in last 24 hours: Temp:  [98.2 F (36.8 C)-98.9 F (37.2 C)] 98.2 F (36.8 C) (08/26 1143) Pulse Rate:  [62-75] 74 (08/26 1143) Resp:  [15-16] 16 (08/26 1143) BP: (118-129)/(61-70) 122/61 (08/26 1143) SpO2:  [95 %-96 %] 96 % (08/26 1143) Weight:  [83.1 kg] 83.1 kg (08/26 0500) Last BM Date : 07/15/24  Intake/Output from previous day: 08/25 0701 - 08/26 0700 In: 360 [P.O.:360] Out: -  Intake/Output this shift: No intake/output data recorded.  General appearance: alert and no distress GI: mild left sided tenderness s/p pain medications  Lab Results: Recent Labs    07/14/24 0604 07/15/24 0614 07/16/24 0525  WBC 15.4* 15.1* 14.8*  HGB 10.0* 9.5* 9.1*  HCT 32.9* 30.0* 29.4*  PLT 555* 475* 469*   BMET Recent Labs    07/14/24 0604 07/15/24 0614 07/16/24 0525  NA 136 132* 134*  K 4.1 4.3 4.4  CL 97* 100 98  CO2 29 25 27   GLUCOSE 77 186* 142*  BUN 9 17 17   CREATININE 0.75 0.68 0.65  CALCIUM  9.0 8.5* 8.8*   LFT Recent Labs    07/15/24 0614  PROT 5.6*  ALBUMIN 2.3*  AST 10*  ALT 15  ALKPHOS 73  BILITOT 0.3   PT/INR No results for input(s): LABPROT, INR in the last 72 hours. Hepatitis Panel No results for input(s): HEPBSAG, HCVAB, HEPAIGM, HEPBIGM in the last 72 hours. C-Diff Recent Labs    07/14/24 1724  CDIFFTOX NEGATIVE   Fecal Lactopherrin No results for input(s): FECLLACTOFRN in the last 72 hours.  Studies/Results: No results found.  Medications: Scheduled:  ascorbic acid   500 mg Oral Daily   atorvastatin   40 mg Oral Daily   feeding supplement (GLUCERNA SHAKE)  237 mL Oral TID BM   ferrous sulfate   325 mg Oral BID WC   insulin  aspart  0-9 Units Subcutaneous Q6H   methylPREDNISolone  (SOLU-MEDROL ) injection  20 mg Intravenous Q12H   pantoprazole   40 mg Oral Daily   [START ON 07/17/2024] Upadacitinib  ER  45 mg Oral Daily   Vitamin D   (Ergocalciferol )  50,000 Units Oral Q7 days   Continuous:  Assessment/Plan: 1) Left sided UC. 2) Abdominal pain.   She is starting on day 3 of Solumedrol.  There is some evidence of improvement as she reports a resolution of blood in her stool.   Her pain persists, but it is being managed with pain medications.  She still has some loose stools.  In the office last week her infliximab  trough and antibody levels were drawn.  The results are still pending, however, it is clear that she is not responding to the medication.  It is optimal to wait for the levels to come back, but clinical presentation suggests a trial of an alternative medication.  Rinvoq  is on hand and this was cleared with Pharmacy.  An order will be placed by Pharmacy and she will be dispensed the medication brought from the office.  Plan: 1) Start Rinvoq  tomorrow. 2) Continue with Solumedrol. 3) Continue with pain medications and other supportive care.  LOS: 5 days   Lewie Deman D 07/16/2024, 1:52 PM

## 2024-07-17 DIAGNOSIS — K219 Gastro-esophageal reflux disease without esophagitis: Secondary | ICD-10-CM

## 2024-07-17 DIAGNOSIS — E782 Mixed hyperlipidemia: Secondary | ICD-10-CM | POA: Diagnosis not present

## 2024-07-17 DIAGNOSIS — D509 Iron deficiency anemia, unspecified: Secondary | ICD-10-CM | POA: Diagnosis not present

## 2024-07-17 DIAGNOSIS — K529 Noninfective gastroenteritis and colitis, unspecified: Secondary | ICD-10-CM | POA: Diagnosis not present

## 2024-07-17 DIAGNOSIS — E119 Type 2 diabetes mellitus without complications: Secondary | ICD-10-CM

## 2024-07-17 DIAGNOSIS — Z8679 Personal history of other diseases of the circulatory system: Secondary | ICD-10-CM | POA: Diagnosis not present

## 2024-07-17 DIAGNOSIS — E559 Vitamin D deficiency, unspecified: Secondary | ICD-10-CM

## 2024-07-17 LAB — GLUCOSE, CAPILLARY
Glucose-Capillary: 129 mg/dL — ABNORMAL HIGH (ref 70–99)
Glucose-Capillary: 142 mg/dL — ABNORMAL HIGH (ref 70–99)
Glucose-Capillary: 178 mg/dL — ABNORMAL HIGH (ref 70–99)
Glucose-Capillary: 184 mg/dL — ABNORMAL HIGH (ref 70–99)
Glucose-Capillary: 201 mg/dL — ABNORMAL HIGH (ref 70–99)

## 2024-07-17 LAB — CBC
HCT: 30 % — ABNORMAL LOW (ref 36.0–46.0)
Hemoglobin: 9.5 g/dL — ABNORMAL LOW (ref 12.0–15.0)
MCH: 26 pg (ref 26.0–34.0)
MCHC: 31.7 g/dL (ref 30.0–36.0)
MCV: 82 fL (ref 80.0–100.0)
Platelets: 458 K/uL — ABNORMAL HIGH (ref 150–400)
RBC: 3.66 MIL/uL — ABNORMAL LOW (ref 3.87–5.11)
RDW: 14.9 % (ref 11.5–15.5)
WBC: 16.9 K/uL — ABNORMAL HIGH (ref 4.0–10.5)
nRBC: 0.1 % (ref 0.0–0.2)

## 2024-07-17 LAB — BASIC METABOLIC PANEL WITH GFR
Anion gap: 12 (ref 5–15)
BUN: 16 mg/dL (ref 8–23)
CO2: 26 mmol/L (ref 22–32)
Calcium: 9.2 mg/dL (ref 8.9–10.3)
Chloride: 96 mmol/L — ABNORMAL LOW (ref 98–111)
Creatinine, Ser: 0.63 mg/dL (ref 0.44–1.00)
GFR, Estimated: >60 mL/min (ref >60)
Glucose, Bld: 130 mg/dL — ABNORMAL HIGH (ref 70–99)
Potassium: 4.5 mmol/L (ref 3.5–5.1)
Sodium: 135 mmol/L (ref 135–145)

## 2024-07-17 LAB — CMV QUANT DNA PCR (URINE)
CMV Qn DNA PCR (Urine): NEGATIVE {copies}/mL
Log10 CMV Qn DCA Ur: UNDETERMINED {Log_copies}/mL

## 2024-07-17 LAB — MAGNESIUM: Magnesium: 2.3 mg/dL (ref 1.7–2.4)

## 2024-07-17 LAB — PHOSPHORUS: Phosphorus: 3.3 mg/dL (ref 2.5–4.6)

## 2024-07-17 MED ORDER — INSULIN ASPART 100 UNIT/ML IJ SOLN
0.0000 [IU] | Freq: Three times a day (TID) | INTRAMUSCULAR | Status: DC
  Administered 2024-07-17: 3 [IU] via SUBCUTANEOUS
  Administered 2024-07-17 – 2024-07-18 (×3): 1 [IU] via SUBCUTANEOUS
  Administered 2024-07-18 – 2024-07-19 (×2): 2 [IU] via SUBCUTANEOUS

## 2024-07-17 NOTE — Plan of Care (Signed)
  Problem: Education: Goal: Ability to describe self-care measures that may prevent or decrease complications (Diabetes Survival Skills Education) will improve Outcome: Progressing   Problem: Coping: Goal: Ability to adjust to condition or change in health will improve Outcome: Progressing   Problem: Metabolic: Goal: Ability to maintain appropriate glucose levels will improve Outcome: Progressing   Problem: Nutritional: Goal: Maintenance of adequate nutrition will improve Outcome: Progressing   Problem: Nutrition: Goal: Adequate nutrition will be maintained Outcome: Progressing   Problem: Elimination: Goal: Will not experience complications related to bowel motility Outcome: Progressing Goal: Will not experience complications related to urinary retention Outcome: Progressing   Problem: Safety: Goal: Ability to remain free from injury will improve Outcome: Progressing

## 2024-07-17 NOTE — Progress Notes (Signed)
 PROGRESS NOTE    Nancy Eaton  FMW:[id number] DOB: 03/18/1956 DOA: 07/10/2024 PCP: Drosinis, Arland PARAS, PA-C    Chief Complaint  Patient presents with   Abdominal Pain    Brief Narrative:  68 yo F admitted for colitis. 68 y.o. female with medical history significant for ulcerative colitis, prediabetes, essential pretension, hyperlipidemia, chronic iron  deficiency anemia with baseline hemoglobin 9-12, who is admitted to [facility] on 07/10/2024 with persistent colitis after presenting from home to [facility] ED complaining of abdominal pain. WBC has been rising on ceftriaxone  and flagyl  (possible it is due to the IV steroids, however, CRP also slightly elevated at 4.1 - 4.6). Pt did not report feeling much better with these 2 antibx. Thus, IV meropenem  was started on 07/12/2024. GI panel in progress as of 11:31 AM 07/12/2024.    Assessment & Plan:   Principal Problem:   Colitis Active Problems:   HLD (hyperlipidemia)   Generalized abdominal pain   Leukocytosis   AKI (acute kidney injury) (HCC)   Prediabetes   History of essential hypertension   Chronic iron  deficiency anemia   Controlled type 2 diabetes mellitus without complication, without long-term current use of insulin  (HCC)   Vitamin D  deficiency   Gastroesophageal reflux disease   #1 acute colitis -Secondary to an acute ulcerative colitis flare. -Stool studies done including C. difficile PCR negative, GI pathogen panel negative. -Patient currently on IV Solu-Medrol  20 mg every 12 hours. - Was on IV meropenem  which have been discontinued per GI recommendations. - Fecal calprotectin elevated at > 8000. - Patient with some clinical improvement. - Per GI patient noted with worsening symptoms in spite of treatment with Inflectra  x 3. - Patient started on Rinvoq  45 mg daily per GI and samples left with patient's nurse who will send the medications to the pharmacy per GI. - Diet advance to soft diet which patient is  tolerating - Per GI.  2.  GERD -Continue PPI.  3.  Chronic iron  deficiency anemia -H&H stable. -Status post IV Venofer  07/15/2024. - Continue oral iron  supplementation.  4.  Diabetes mellitus type 2 -Hemoglobin A1c 6.7 - CBG noted at 129 this morning - Diabetes likely diet controlled as patient not on any diabetic medications on med rec. - Change SSI to AC - Change CBGs to [facility] and at bedtime.  5.  Hyperlipidemia -Continue statin.  6.  Hypertension -BP currently stable. - Patient noted to be on Cozaar  prior to admission which is currently on hold.  7.  Vitamin D  deficiency -Patient started on vitamin D  50,000 units weekly. - Will need repeat vitamin D  levels in 3 to 6 months with outpatient follow-up with PCP.  8.  Hyponatremia -Serum osmolality of 289. - Improved with hydration. - Follow.    DVT prophylaxis: SCDs Code Status: Full Family Communication: Updated patient.  No family at bedside. Disposition: Home when clinically improved and cleared by GI.  Status is: Inpatient Remains inpatient appropriate because: Severity of illness   Consultants:  Gastroenterology: Dr. Nandigam 07/14/2024  Procedures:  CT abdomen and pelvis 07/10/2024   Antimicrobials:  Anti-infectives (From admission, onward)    Start     Dose/Rate Route Frequency Ordered Stop   07/12/24 1400  meropenem  (MERREM ) 1 g in sodium chloride  0.9 % 100 mL IVPB  Status:  Discontinued        1 g 200 mL/hr over 30 Minutes Intravenous Every 8 hours 07/12/24 1051 07/14/24 1036   07/11/24 0145  cefTRIAXone  (ROCEPHIN ) 2  g in sodium chloride  0.9 % 100 mL IVPB  Status:  Discontinued        2 g 200 mL/hr over 30 Minutes Intravenous Every 24 hours 07/11/24 0139 07/12/24 1051   07/11/24 0145  metroNIDAZOLE  (FLAGYL ) IVPB 500 mg  Status:  Discontinued        500 mg 100 mL/hr over 60 Minutes Intravenous Every 12 hours 07/11/24 0139 07/12/24 1051         Subjective: Patient laying in bed.  Denies any  further bloody diarrhea today.  Some improvement with abdominal pain.  Currently tolerating a soft diet.  No nausea or emesis.  Objective: Vitals:   07/16/24 1143 07/16/24 2041 07/17/24 0544 07/17/24 1356  BP: 122/61 (!) 143/82 130/77 138/73  Pulse: 74 77 68 95  Resp: 16 18 18    Temp: 98.2 F (36.8 C) 98.4 F (36.9 C) 98 F (36.7 C) 98 F (36.7 C)  TempSrc:      SpO2: 96% 99% 95% 97%  Weight:      Height:        Intake/Output Summary (Last 24 hours) at 07/17/2024 2050 Last data filed at 07/17/2024 0800 Gross per 24 hour  Intake 240 ml  Output --  Net 240 ml   Filed Weights   07/14/24 0505 07/15/24 0559 07/16/24 0500  Weight: 81.9 kg 80.8 kg 83.1 kg    Examination:  General exam: Appears calm and comfortable  Respiratory system: Clear to auscultation. Respiratory effort normal. Cardiovascular system: S1 & S2 heard, RRR. No JVD, murmurs, rubs, gallops or clicks. No pedal edema. Gastrointestinal system: Abdomen is nondistended, soft and decreased abdominal tenderness to palpation.  Positive bowel sounds.  No rebound.  No guarding.  Central nervous system: Alert and oriented. No focal neurological deficits. Extremities: Symmetric 5 x 5 power. Skin: No rashes, lesions or ulcers Psychiatry: Judgement and insight appear normal. Mood & affect appropriate.     Data Reviewed: I have personally reviewed following labs and imaging studies  CBC: Recent Labs  Lab 07/11/24 0524 07/12/24 0540 07/13/24 0743 07/14/24 0604 07/15/24 0614 07/16/24 0525 07/17/24 0546  WBC 13.7*   < > 13.5* 15.4* 15.1* 14.8* 16.9*  NEUTROABS 9.0*  --   --   --  12.6*  --   --   HGB 9.0*   < > 9.4* 10.0* 9.5* 9.1* 9.5*  HCT 28.9*   < > 30.9* 32.9* 30.0* 29.4* 30.0*  MCV 83.0   < > 83.7 83.5 82.2 82.4 82.0  PLT 529*   < > 537* 555* 475* 469* 458*   < > = values in this interval not displayed.    Basic Metabolic Panel: Recent Labs  Lab 07/13/24 0743 07/14/24 0604 07/15/24 0614 07/16/24 0525  07/17/24 0546  NA 136 136 132* 134* 135  K 3.6 4.1 4.3 4.4 4.5  CL 100 97* 100 98 96*  CO2 27 29 25 27 26   GLUCOSE 77 77 186* 142* 130*  BUN 11 9 17 17 16   CREATININE 0.72 0.75 0.68 0.65 0.63  CALCIUM  8.9 9.0 8.5* 8.8* 9.2  MG 2.3 2.4 2.3 2.2 2.3  PHOS 2.6 2.6 3.2 2.7 3.3    GFR: Estimated Creatinine Clearance: 67.8 mL/min (by C-G formula based on SCr of 0.63 mg/dL).  Liver Function Tests: Recent Labs  Lab 07/11/24 0524 07/12/24 0540 07/13/24 0743 07/14/24 0604 07/15/24 0614  AST 12* 8* 9* 9* 10*  ALT 13 14 13 12 15   ALKPHOS 60 62 63 71 73  BILITOT <0.2 0.2 0.5 0.3 0.3  PROT 5.9* 5.7* 6.0* 6.1* 5.6*  ALBUMIN 2.3* 2.2* 2.3* 2.5* 2.3*    CBG: Recent Labs  Lab 07/16/24 1725 07/17/24 0018 07/17/24 0543 07/17/24 1122 07/17/24 1730  GLUCAP 162* 184* 129* 142* 201*     Recent Results (from the past 240 hours)  Gastrointestinal Panel by PCR , Stool     Status: None   Collection Time: 07/12/24 11:31 AM   Specimen: Stool  Result Value Ref Range Status   Campylobacter species NOT DETECTED NOT DETECTED Final   Plesimonas shigelloides NOT DETECTED NOT DETECTED Final   Salmonella species NOT DETECTED NOT DETECTED Final   Yersinia enterocolitica NOT DETECTED NOT DETECTED Final   Vibrio species NOT DETECTED NOT DETECTED Final   Vibrio cholerae NOT DETECTED NOT DETECTED Final   Enteroaggregative E coli (EAEC) NOT DETECTED NOT DETECTED Final   Enteropathogenic E coli (EPEC) NOT DETECTED NOT DETECTED Final   Enterotoxigenic E coli (ETEC) NOT DETECTED NOT DETECTED Final   Shiga like toxin producing E coli (STEC) NOT DETECTED NOT DETECTED Final   Shigella/Enteroinvasive E coli (EIEC) NOT DETECTED NOT DETECTED Final   Cryptosporidium NOT DETECTED NOT DETECTED Final   Cyclospora cayetanensis NOT DETECTED NOT DETECTED Final   Entamoeba histolytica NOT DETECTED NOT DETECTED Final   Giardia lamblia NOT DETECTED NOT DETECTED Final   Adenovirus F40/41 NOT DETECTED NOT DETECTED  Final   Astrovirus NOT DETECTED NOT DETECTED Final   Norovirus GI/GII NOT DETECTED NOT DETECTED Final   Rotavirus A NOT DETECTED NOT DETECTED Final   Sapovirus (I, II, IV, and V) NOT DETECTED NOT DETECTED Final    Comment: Performed at [facility], [address] [place], [place] [address]  C Difficile Quick Screen (NO PCR Reflex)     Status: None   Collection Time: 07/14/24  5:24 PM   Specimen: STOOL  Result Value Ref Range Status   C Diff antigen NEGATIVE NEGATIVE Final   C Diff toxin NEGATIVE NEGATIVE Final   C Diff interpretation No C. difficile detected.  Final    Comment: Performed at [facility], [address], [place], [place] [address]  Calprotectin, Fecal     Status: Abnormal   Collection Time: 07/14/24  5:24 PM   Specimen: STOOL  Result Value Ref Range Status   Calprotectin, Fecal >8,000 (H) 0 - 120 ug/g Final    Comment: (NOTE) **Results verified by repeat testing** Concentration     Interpretation   Follow-Up < 5 - 50 ug/g     Normal           None >50 -120 ug/g     Borderline       Re-evaluate in 4-6 weeks    >120 ug/g     Abnormal         Repeat as clinically                                   indicated Performed At: [facility] [address] [place], [place] [id number] Jennette Shorter MD Ey:[id number]          Radiology Studies: No results found.      Scheduled Meds:  ascorbic acid   500 mg Oral Daily   atorvastatin   40 mg Oral Daily   feeding supplement (GLUCERNA SHAKE)  237 mL Oral TID BM   ferrous sulfate   325 mg Oral BID WC  insulin  aspart  0-9 Units Subcutaneous TID WC   methylPREDNISolone  (SOLU-MEDROL ) injection  20 mg Intravenous Q12H   pantoprazole   40 mg Oral Daily   Upadacitinib  ER  45 mg Oral Daily   Vitamin D  (Ergocalciferol )  50,000 Units Oral Q7 days   Continuous Infusions:   LOS: 6 days    Time spent: 40 minutes    Toribio Hummer, MD [facility]   To contact the attending  provider between 7A-7P or the covering provider during after hours 7P-7A, please log into the web site [email or web address] and access using universal [place] password for that web site. If you do not have the password, please call the hospital operator.  07/17/2024, 8:50 PM

## 2024-07-17 NOTE — Progress Notes (Signed)
 Subjective: Since I last evaluated the patient, he seems to be doing slightly better today the abdominal cramping seems to occur postprandially and her BMs have decreased to 5/day instead of 1 every 30 minutes.  here is no more blood in the stool.  She denies having nausea or vomiting. There is no history of fever chills or rigors.  Objective: Vital signs in last 24 hours: Temp:  [98 F (36.7 C)-98.4 F (36.9 C)] 98 F (36.7 C) (08/27 0544) Pulse Rate:  [68-77] 68 (08/27 0544) Resp:  [16-18] 18 (08/27 0544) BP: (122-143)/(61-82) 130/77 (08/27 0544) SpO2:  [95 %-99 %] 95 % (08/27 0544) Last BM Date : 07/15/24  Intake/Output from previous day: 08/26 0701 - 08/27 0700 In: 240 [P.O.:240] Out: -  Intake/Output this shift: No intake/output data recorded.  General appearance: alert, cooperative, fatigued, and moderately obese Resp: clear to auscultation bilaterally Cardio: regular rate and rhythm, S1, S2 normal, no murmur, click, rub or gallop GI: soft, non-tender; bowel sounds normal; no masses,  no organomegaly  Lab Results: Recent Labs    07/15/24 0614 07/16/24 0525 07/17/24 0546  WBC 15.1* 14.8* 16.9*  HGB 9.5* 9.1* 9.5*  HCT 30.0* 29.4* 30.0*  PLT 475* 469* 458*   BMET Recent Labs    07/15/24 0614 07/16/24 0525  NA 132* 134*  K 4.3 4.4  CL 100 98  CO2 25 27  GLUCOSE 186* 142*  BUN 17 17  CREATININE 0.68 0.65  CALCIUM  8.5* 8.8*   LFT Recent Labs    07/15/24 0614  PROT 5.6*  ALBUMIN 2.3*  AST 10*  ALT 15  ALKPHOS 73  BILITOT 0.3   C-Diff Recent Labs    07/14/24 1724  CDIFFTOX NEGATIVE   No results for input(s): CDIFFPCR in the last 72 hours. Fecal Lactopherrin No results for input(s): FECLLACTOFRN in the last 72 hours.  Studies/Results: No results found.  Medications: I have reviewed the patient's current medications. Prior to Admission:  Medications Prior to Admission  Medication Sig Dispense Refill Last Dose/Taking   acetaminophen   (TYLENOL ) 500 MG tablet Take 500 mg by mouth every 6 (six) hours as needed for moderate pain (pain score 4-6).   Unknown   atorvastatin  (LIPITOR) 40 MG tablet Take 1 tablet (40 mg total) by mouth daily. 90 tablet 0 07/09/2024   FEROSUL 325 (65 Fe) MG tablet Take 325 mg by mouth every other day.   Past Month   losartan  (COZAAR ) 50 MG tablet Take 50 mg by mouth daily.   07/10/2024 Morning   ondansetron  (ZOFRAN -ODT) 4 MG disintegrating tablet Take 1 tablet (4 mg total) by mouth every 8 (eight) hours as needed for nausea or vomiting. 20 tablet 0 07/10/2024 Evening   oxyCODONE  (ROXICODONE ) 5 MG immediate release tablet Take 1 tablet (5 mg total) by mouth every 4 (four) hours as needed for severe pain (pain score 7-10). 15 tablet 0 07/09/2024   Ascorbic Acid  (C EXTRA STRENGTH) 1000 MG TABS Take 1 tablet by mouth every other day. (Patient not taking: Reported on 07/11/2024)   Not Taking   azithromycin  (ZITHROMAX ) 250 MG tablet Take 250 mg by mouth as directed. (Patient not taking: Reported on 07/11/2024)   Not Taking   CALCIUM -VITAMIN D  PO Take 2 tablets by mouth daily. (Patient not taking: Reported on 07/11/2024)   Not Taking   Cholecalciferol 1.25 MG (50000 UT) capsule Take 50,000 Units by mouth once a week. (Patient not taking: Reported on 07/11/2024)   Not Taking   clindamycin  (  CLEOCIN ) 300 MG capsule Take 300 mg by mouth 3 (three) times daily. (Patient not taking: Reported on 07/11/2024)   Not Taking   Glucosamine HCl-MSM (GLUCOSAMINE-MSM PO) Take 1 tablet by mouth daily. (Patient not taking: Reported on 07/11/2024)   Not Taking   INFLECTRA  100 MG SOLR Inject into the vein. (Patient not taking: Reported on 07/11/2024)   Not Taking   mesalamine  (CANASA ) 1000 MG suppository Place 1,000 mg rectally at bedtime. (Patient not taking: Reported on 07/11/2024)   Not Taking   mesalamine  (LIALDA ) 1.2 g EC tablet Take 2.4 g by mouth daily. (Patient not taking: Reported on 07/11/2024)   Not Taking   methylPREDNISolone  (MEDROL   DOSEPAK) 4 MG TBPK tablet Take by mouth as directed. (Patient not taking: Reported on 07/11/2024)   Not Taking   Multiple Vitamins-Minerals (MULTIVITAMIN WITH MINERALS) tablet Take 1 tablet by mouth daily. Women 50+ (Patient not taking: Reported on 07/11/2024)   Not Taking   Multiple Vitamins-Minerals (PRESERVISION AREDS 2) CAPS Take 1 tablet by mouth daily. (Patient not taking: Reported on 07/11/2024)   Not Taking   nystatin (MYCOSTATIN) 100000 UNIT/ML suspension Take 4 mLs by mouth 4 (four) times daily. (Patient not taking: Reported on 07/11/2024)   Not Taking   Omega-3 Fatty Acids (FISH OIL PO) Take 1,400 mg by mouth daily. Omega3 980 mg (Patient not taking: Reported on 07/11/2024)   Not Taking   Turmeric 500 MG CAPS Take 1,500 mg by mouth in the morning, at noon, and at bedtime.  (Patient not taking: Reported on 07/11/2024)   Not Taking   Scheduled:  ascorbic acid   500 mg Oral Daily   atorvastatin   40 mg Oral Daily   feeding supplement (GLUCERNA SHAKE)  237 mL Oral TID BM   ferrous sulfate   325 mg Oral BID WC   insulin  aspart  0-9 Units Subcutaneous TID WC   methylPREDNISolone  (SOLU-MEDROL ) injection  20 mg Intravenous Q12H   pantoprazole   40 mg Oral Daily   Upadacitinib  ER  45 mg Oral Daily   Vitamin D  (Ergocalciferol )  50,000 Units Oral Q7 days   Continuous: PRN:acetaminophen  **OR** acetaminophen , alum & mag hydroxide-simeth, HYDROmorphone  (DILAUDID ) injection, melatonin, naLOXone  (NARCAN )  injection, ondansetron  (ZOFRAN ) IV  Assessment/Plan: 1) Refractory left-sided colitis on Solu-Medrol -with worsening symptoms in spite of treatment with Inflectra  x 3.  Plans are to stabilize the patient with steroids. I have left samples of Rinvoq  45 mg with the patient's nurse around [place] who will send the medication to the pharmacy for the attempt to dispense it to the patient.SABRA 2) Active acid reflux with nausea and vomiting-will start the patient on PPI today. 3) Chronic iron  deficiency anemia on  ferrous sulfate . 4) AODM. 5) Hypertension. 6) Hyperlipidemia. 7) Vitamin D  deficiency. 8) Hyponatremia-resolved.  LOS: 6 days   [name] 07/17/2024, 6:56 AM

## 2024-07-18 ENCOUNTER — Other Ambulatory Visit (HOSPITAL_COMMUNITY): Payer: Self-pay

## 2024-07-18 DIAGNOSIS — Z8679 Personal history of other diseases of the circulatory system: Secondary | ICD-10-CM | POA: Diagnosis not present

## 2024-07-18 DIAGNOSIS — K51911 Ulcerative colitis, unspecified with rectal bleeding: Secondary | ICD-10-CM | POA: Diagnosis present

## 2024-07-18 DIAGNOSIS — E782 Mixed hyperlipidemia: Secondary | ICD-10-CM | POA: Diagnosis not present

## 2024-07-18 DIAGNOSIS — D509 Iron deficiency anemia, unspecified: Secondary | ICD-10-CM | POA: Diagnosis not present

## 2024-07-18 DIAGNOSIS — K529 Noninfective gastroenteritis and colitis, unspecified: Secondary | ICD-10-CM | POA: Diagnosis not present

## 2024-07-18 LAB — CBC WITH DIFFERENTIAL/PLATELET
Abs Immature Granulocytes: 0.19 K/uL — ABNORMAL HIGH (ref 0.00–0.07)
Basophils Absolute: 0 K/uL (ref 0.0–0.1)
Basophils Relative: 0 %
Eosinophils Absolute: 0 K/uL (ref 0.0–0.5)
Eosinophils Relative: 0 %
HCT: 29.1 % — ABNORMAL LOW (ref 36.0–46.0)
Hemoglobin: 9.2 g/dL — ABNORMAL LOW (ref 12.0–15.0)
Immature Granulocytes: 2 %
Lymphocytes Relative: 17 %
Lymphs Abs: 1.7 K/uL (ref 0.7–4.0)
MCH: 26.1 pg (ref 26.0–34.0)
MCHC: 31.6 g/dL (ref 30.0–36.0)
MCV: 82.7 fL (ref 80.0–100.0)
Monocytes Absolute: 1.3 K/uL — ABNORMAL HIGH (ref 0.1–1.0)
Monocytes Relative: 13 %
Neutro Abs: 7 K/uL (ref 1.7–7.7)
Neutrophils Relative %: 68 %
Platelets: 431 K/uL — ABNORMAL HIGH (ref 150–400)
RBC: 3.52 MIL/uL — ABNORMAL LOW (ref 3.87–5.11)
RDW: 15.1 % (ref 11.5–15.5)
WBC: 10.3 K/uL (ref 4.0–10.5)
nRBC: 0 % (ref 0.0–0.2)

## 2024-07-18 LAB — RENAL FUNCTION PANEL
Albumin: 3.1 g/dL — ABNORMAL LOW (ref 3.5–5.0)
Anion gap: 12 (ref 5–15)
BUN: 20 mg/dL (ref 8–23)
CO2: 26 mmol/L (ref 22–32)
Calcium: 9 mg/dL (ref 8.9–10.3)
Chloride: 96 mmol/L — ABNORMAL LOW (ref 98–111)
Creatinine, Ser: 0.68 mg/dL (ref 0.44–1.00)
GFR, Estimated: >60 mL/min (ref >60)
Glucose, Bld: 160 mg/dL — ABNORMAL HIGH (ref 70–99)
Phosphorus: 3.3 mg/dL (ref 2.5–4.6)
Potassium: 4.7 mmol/L (ref 3.5–5.1)
Sodium: 133 mmol/L — ABNORMAL LOW (ref 135–145)

## 2024-07-18 LAB — GLUCOSE, CAPILLARY
Glucose-Capillary: 232 mg/dL — ABNORMAL HIGH (ref 70–99)
Glucose-Capillary: 145 mg/dL — ABNORMAL HIGH (ref 70–99)
Glucose-Capillary: 135 mg/dL — ABNORMAL HIGH (ref 70–99)
Glucose-Capillary: 138 mg/dL — ABNORMAL HIGH (ref 70–99)
Glucose-Capillary: 188 mg/dL — ABNORMAL HIGH (ref 70–99)
Glucose-Capillary: 157 mg/dL — ABNORMAL HIGH (ref 70–99)

## 2024-07-18 LAB — MAGNESIUM: Magnesium: 2.3 mg/dL (ref 1.7–2.4)

## 2024-07-18 NOTE — Plan of Care (Signed)
  Problem: Coping: Goal: Ability to adjust to condition or change in health will improve Outcome: Progressing   Problem: Health Behavior/Discharge Planning: Goal: Ability to identify and utilize available resources and services will improve Outcome: Progressing   Problem: Metabolic: Goal: Ability to maintain appropriate glucose levels will improve Outcome: Progressing   Problem: Nutritional: Goal: Progress toward achieving an optimal weight will improve Outcome: Progressing   Problem: Health Behavior/Discharge Planning: Goal: Ability to manage health-related needs will improve Outcome: Progressing   Problem: Clinical Measurements: Goal: Will remain free from infection Outcome: Progressing   Problem: Pain Managment: Goal: General experience of comfort will improve and/or be controlled Outcome: Progressing

## 2024-07-18 NOTE — Progress Notes (Signed)
 Subjective: Feeling better.  She reports an improvement with her symptoms with starting Rinvoq .  Objective: Vital signs in last 24 hours: Temp:  [97.8 F (36.6 C)-98 F (36.7 C)] 98 F (36.7 C) (08/28 0559) Pulse Rate:  [62-95] 62 (08/28 0559) Resp:  [18] 18 (08/28 0559) BP: (123-138)/(62-73) 130/62 (08/28 0559) SpO2:  [97 %-98 %] 97 % (08/28 0559) Weight:  [83.1 kg] 83.1 kg (08/28 0433) Last BM Date : 07/17/24  Intake/Output from previous day: 08/27 0701 - 08/28 0700 In: 240 [P.O.:240] Out: -  Intake/Output this shift: No intake/output data recorded.  General appearance: alert and no distress GI: mild left sided tenderness  Lab Results: Recent Labs    07/16/24 0525 07/17/24 0546 07/18/24 0516  WBC 14.8* 16.9* 10.3  HGB 9.1* 9.5* 9.2*  HCT 29.4* 30.0* 29.1*  PLT 469* 458* 431*   BMET Recent Labs    07/16/24 0525 07/17/24 0546 07/18/24 0516  NA 134* 135 133*  K 4.4 4.5 4.7  CL 98 96* 96*  CO2 27 26 26   GLUCOSE 142* 130* 160*  BUN 17 16 20   CREATININE 0.65 0.63 0.68  CALCIUM  8.8* 9.2 9.0   LFT Recent Labs    07/18/24 0516  ALBUMIN 3.1*   PT/INR No results for input(s): LABPROT, INR in the last 72 hours. Hepatitis Panel No results for input(s): HEPBSAG, HCVAB, HEPAIGM, HEPBIGM in the last 72 hours. C-Diff No results for input(s): CDIFFTOX in the last 72 hours. Fecal Lactopherrin No results for input(s): FECLLACTOFRN in the last 72 hours.  Studies/Results: No results found.  Medications: Scheduled:  ascorbic acid   500 mg Oral Daily   atorvastatin   40 mg Oral Daily   feeding supplement (GLUCERNA SHAKE)  237 mL Oral TID BM   ferrous sulfate   325 mg Oral BID WC   insulin  aspart  0-9 Units Subcutaneous TID WC   methylPREDNISolone  (SOLU-MEDROL ) injection  20 mg Intravenous Q12H   pantoprazole   40 mg Oral Daily   Upadacitinib  ER  45 mg Oral Daily   Vitamin D  (Ergocalciferol )  50,000 Units Oral Q7 days    Continuous:  Assessment/Plan: 1) Left sided UC. 2) Abdominal pain.   She is improving clinically.  She will maintain Rinvoq  and it is possible for a discharge tomorrow.  Plan: 1) Continue with Rinvoq . 2) Maintain steroids for now.  LOS: 7 days   Dixon Luczak D 07/18/2024, 7:21 AM

## 2024-07-18 NOTE — Plan of Care (Signed)
  Problem: Fluid Volume: Goal: Ability to maintain a balanced intake and output will improve Outcome: Progressing   Problem: Metabolic: Goal: Ability to maintain appropriate glucose levels will improve Outcome: Progressing   

## 2024-07-18 NOTE — Progress Notes (Signed)
 PROGRESS NOTE    Nancy Eaton  FMW:[id number] DOB: 08/22/1956 DOA: 07/10/2024 PCP: Drosinis, Arland PARAS, PA-C    Chief Complaint  Patient presents with   Abdominal Pain    Brief Narrative:  68 yo F admitted for colitis. 68 y.o. female with medical history significant for ulcerative colitis, prediabetes, essential pretension, hyperlipidemia, chronic iron  deficiency anemia with baseline hemoglobin 9-12, who is admitted to [facility] on 07/10/2024 with persistent colitis after presenting from home to [facility] ED complaining of abdominal pain. WBC has been rising on ceftriaxone  and flagyl  (possible it is due to the IV steroids, however, CRP also slightly elevated at 4.1 - 4.6). Pt did not report feeling much better with these 2 antibx. Thus, IV meropenem  was started on 07/12/2024. GI panel in progress as of 11:31 AM 07/12/2024.    Assessment & Plan:   Principal Problem:   Ulcerative colitis, acute, with rectal bleeding (HCC) Active Problems:   HLD (hyperlipidemia)   Colitis   Generalized abdominal pain   Leukocytosis   AKI (acute kidney injury) (HCC)   Prediabetes   History of essential hypertension   Chronic iron  deficiency anemia   Controlled type 2 diabetes mellitus without complication, without long-term current use of insulin  (HCC)   Vitamin D  deficiency   Gastroesophageal reflux disease   #1 acute colitis/acute ulcerative colitis flare -Secondary to an acute ulcerative colitis flare. -Stool studies done including C. difficile PCR negative, GI pathogen panel negative. - Continue IV Solu-Medrol  20 mg every 12 hours. - Was on IV meropenem  which has been discontinued per GI recommendations. - Fecal calprotectin elevated at > 8000. - Patient improving clinically daily.  - Per GI patient noted with worsening symptoms in spite of treatment with Inflectra  x 3 in the outpatient setting. - Patient started on Rinvoq  45 mg daily per GI and samples left with patient's nurse who  will send the medications to the pharmacy per GI to be given to patient during the hospitalization. - Currently tolerating soft diet. - Per GI.  2.  GERD - PPI.   3.  Chronic iron  deficiency anemia -H&H stable at 9.2. -Status post IV Venofer  07/15/2024. - Continue oral iron  supplementation.  4.  Diabetes mellitus type 2 -Hemoglobin A1c 6.7 - CBG noted at 135 this morning - Diabetes likely diet controlled as patient not on any diabetic medications on med rec. - Continue SSI.  5.  Hyperlipidemia -Continue statin.  6.  Hypertension -BP currently stable and controlled. - Patient noted to be on Cozaar  prior to admission which is currently on hold. - Outpatient follow-up with PCP  7.  Vitamin D  deficiency -Patient started on vitamin D  50,000 units weekly. - Will need repeat vitamin D  levels in 3 to 6 months with outpatient follow-up with PCP.  8.  Hyponatremia -Serum osmolality of 289. - Improved with hydration. - Follow.    DVT prophylaxis: SCDs Code Status: Full Family Communication: Updated patient.  No family at bedside. Disposition: Home when clinically improved and cleared by GI hopefully in the next 24 to 48 hours.  Status is: Inpatient Remains inpatient appropriate because: Severity of illness   Consultants:  Gastroenterology: Dr. Nandigam 07/14/2024  Procedures:  CT abdomen and pelvis 07/10/2024   Antimicrobials:  Anti-infectives (From admission, onward)    Start     Dose/Rate Route Frequency Ordered Stop   07/12/24 1400  meropenem  (MERREM ) 1 g in sodium chloride  0.9 % 100 mL IVPB  Status:  Discontinued  1 g 200 mL/hr over 30 Minutes Intravenous Every 8 hours 07/12/24 1051 07/14/24 1036   07/11/24 0145  cefTRIAXone  (ROCEPHIN ) 2 g in sodium chloride  0.9 % 100 mL IVPB  Status:  Discontinued        2 g 200 mL/hr over 30 Minutes Intravenous Every 24 hours 07/11/24 0139 07/12/24 1051   07/11/24 0145  metroNIDAZOLE  (FLAGYL ) IVPB 500 mg  Status:   Discontinued        500 mg 100 mL/hr over 60 Minutes Intravenous Every 12 hours 07/11/24 0139 07/12/24 1051         Subjective: Patient lying in bed.  Overall feeling much better.  Denies any further bloody bowel movements.  States abdominal pain has improved and so she is responding to new medication for her ulcerative colitis.  Patient denies any chest pain, no shortness of breath.  No nausea or emesis.  Tolerating current diet.    Objective: Vitals:   07/17/24 2159 07/18/24 0433 07/18/24 0559 07/18/24 1243  BP: 123/64  130/62 118/65  Pulse: 64  62 76  Resp: 18  18 16   Temp: 97.8 F (36.6 C)  98 F (36.7 C) 98.2 F (36.8 C)  TempSrc: Oral  Oral   SpO2: 98%  97% 95%  Weight:  83.1 kg    Height:        Intake/Output Summary (Last 24 hours) at 07/18/2024 1718 Last data filed at 07/18/2024 1400 Gross per 24 hour  Intake 480 ml  Output --  Net 480 ml   Filed Weights   07/15/24 0559 07/16/24 0500 07/18/24 0433  Weight: 80.8 kg 83.1 kg 83.1 kg    Examination:  General exam: NAD Respiratory system: CTAB.  No wheezes, no crackles, no rhonchi.  Fair air movement.  Speaking in full sentences.  Cardiovascular system: Regular rate and rhythm no murmurs rubs or gallops.  No JVD.  No lower extremity edema. Gastrointestinal system: Abdomen is soft, nondistended, decreased tenderness to palpation.  Positive bowel sounds.  No rebound.  No guarding.  Central nervous system: Alert and oriented. No focal neurological deficits. Extremities: Symmetric 5 x 5 power. Skin: No rashes, lesions or ulcers Psychiatry: Judgement and insight appear normal. Mood & affect appropriate.     Data Reviewed: I have personally reviewed following labs and imaging studies  CBC: Recent Labs  Lab 07/14/24 0604 07/15/24 0614 07/16/24 0525 07/17/24 0546 07/18/24 0516  WBC 15.4* 15.1* 14.8* 16.9* 10.3  NEUTROABS  --  12.6*  --   --  7.0  HGB 10.0* 9.5* 9.1* 9.5* 9.2*  HCT 32.9* 30.0* 29.4* 30.0*  29.1*  MCV 83.5 82.2 82.4 82.0 82.7  PLT 555* 475* 469* 458* 431*    Basic Metabolic Panel: Recent Labs  Lab 07/14/24 0604 07/15/24 0614 07/16/24 0525 07/17/24 0546 07/18/24 0516  NA 136 132* 134* 135 133*  K 4.1 4.3 4.4 4.5 4.7  CL 97* 100 98 96* 96*  CO2 29 25 27 26 26   GLUCOSE 77 186* 142* 130* 160*  BUN 9 17 17 16 20   CREATININE 0.75 0.68 0.65 0.63 0.68  CALCIUM  9.0 8.5* 8.8* 9.2 9.0  MG 2.4 2.3 2.2 2.3 2.3  PHOS 2.6 3.2 2.7 3.3 3.3    GFR: Estimated Creatinine Clearance: 67.8 mL/min (by C-G formula based on SCr of 0.68 mg/dL).  Liver Function Tests: Recent Labs  Lab 07/12/24 0540 07/13/24 0743 07/14/24 0604 07/15/24 0614 07/18/24 0516  AST 8* 9* 9* 10*  --   ALT 14  13 12 15   --   ALKPHOS 62 63 71 73  --   BILITOT 0.2 0.5 0.3 0.3  --   PROT 5.7* 6.0* 6.1* 5.6*  --   ALBUMIN 2.2* 2.3* 2.5* 2.3* 3.1*    CBG: Recent Labs  Lab 07/17/24 2334 07/18/24 0556 07/18/24 0757 07/18/24 1152 07/18/24 1704  GLUCAP 178* 145* 135* 138* 188*     Recent Results (from the past 240 hours)  Gastrointestinal Panel by PCR , Stool     Status: None   Collection Time: 07/12/24 11:31 AM   Specimen: Stool  Result Value Ref Range Status   Campylobacter species NOT DETECTED NOT DETECTED Final   Plesimonas shigelloides NOT DETECTED NOT DETECTED Final   Salmonella species NOT DETECTED NOT DETECTED Final   Yersinia enterocolitica NOT DETECTED NOT DETECTED Final   Vibrio species NOT DETECTED NOT DETECTED Final   Vibrio cholerae NOT DETECTED NOT DETECTED Final   Enteroaggregative E coli (EAEC) NOT DETECTED NOT DETECTED Final   Enteropathogenic E coli (EPEC) NOT DETECTED NOT DETECTED Final   Enterotoxigenic E coli (ETEC) NOT DETECTED NOT DETECTED Final   Shiga like toxin producing E coli (STEC) NOT DETECTED NOT DETECTED Final   Shigella/Enteroinvasive E coli (EIEC) NOT DETECTED NOT DETECTED Final   Cryptosporidium NOT DETECTED NOT DETECTED Final   Cyclospora cayetanensis NOT  DETECTED NOT DETECTED Final   Entamoeba histolytica NOT DETECTED NOT DETECTED Final   Giardia lamblia NOT DETECTED NOT DETECTED Final   Adenovirus F40/41 NOT DETECTED NOT DETECTED Final   Astrovirus NOT DETECTED NOT DETECTED Final   Norovirus GI/GII NOT DETECTED NOT DETECTED Final   Rotavirus A NOT DETECTED NOT DETECTED Final   Sapovirus (I, II, IV, and V) NOT DETECTED NOT DETECTED Final    Comment: Performed at [facility], [address] [place], [place] [address]  C Difficile Quick Screen (NO PCR Reflex)     Status: None   Collection Time: 07/14/24  5:24 PM   Specimen: STOOL  Result Value Ref Range Status   C Diff antigen NEGATIVE NEGATIVE Final   C Diff toxin NEGATIVE NEGATIVE Final   C Diff interpretation No C. difficile detected.  Final    Comment: Performed at [facility], [address], [place], [place] [address]  Calprotectin, Fecal     Status: Abnormal   Collection Time: 07/14/24  5:24 PM   Specimen: STOOL  Result Value Ref Range Status   Calprotectin, Fecal >8,000 (H) 0 - 120 ug/g Final    Comment: (NOTE) **Results verified by repeat testing** Concentration     Interpretation   Follow-Up < 5 - 50 ug/g     Normal           None >50 -120 ug/g     Borderline       Re-evaluate in 4-6 weeks    >120 ug/g     Abnormal         Repeat as clinically                                   indicated Performed At: [facility] [address] [place], [place] [id number] Jennette Shorter MD Ey:[id number]          Radiology Studies: No results found.      Scheduled Meds:  ascorbic acid   500 mg Oral Daily   atorvastatin   40 mg Oral Daily  feeding supplement (GLUCERNA SHAKE)  237 mL Oral TID BM   ferrous sulfate   325 mg Oral BID WC   insulin  aspart  0-9 Units Subcutaneous TID WC   methylPREDNISolone  (SOLU-MEDROL ) injection  20 mg Intravenous Q12H   pantoprazole   40 mg Oral Daily   Upadacitinib  ER  45 mg Oral Daily   Vitamin D   (Ergocalciferol )  50,000 Units Oral Q7 days   Continuous Infusions:   LOS: 7 days    Time spent: 40 minutes    Toribio Hummer, MD [facility]   To contact the attending provider between 7A-7P or the covering provider during after hours 7P-7A, please log into the web site [email or web address] and access using universal [place] password for that web site. If you do not have the password, please call the hospital operator.  07/18/2024, 5:18 PM

## 2024-07-19 ENCOUNTER — Other Ambulatory Visit (HOSPITAL_COMMUNITY): Payer: Self-pay

## 2024-07-19 DIAGNOSIS — E782 Mixed hyperlipidemia: Secondary | ICD-10-CM | POA: Diagnosis not present

## 2024-07-19 DIAGNOSIS — E119 Type 2 diabetes mellitus without complications: Secondary | ICD-10-CM | POA: Diagnosis not present

## 2024-07-19 DIAGNOSIS — N179 Acute kidney failure, unspecified: Secondary | ICD-10-CM | POA: Diagnosis not present

## 2024-07-19 DIAGNOSIS — K51911 Ulcerative colitis, unspecified with rectal bleeding: Secondary | ICD-10-CM | POA: Diagnosis not present

## 2024-07-19 LAB — CBC WITH DIFFERENTIAL/PLATELET
Abs Immature Granulocytes: 0.12 K/uL — ABNORMAL HIGH (ref 0.00–0.07)
Basophils Absolute: 0 K/uL (ref 0.0–0.1)
Basophils Relative: 0 %
Eosinophils Absolute: 0 K/uL (ref 0.0–0.5)
Eosinophils Relative: 0 %
HCT: 30.6 % — ABNORMAL LOW (ref 36.0–46.0)
Hemoglobin: 9.2 g/dL — ABNORMAL LOW (ref 12.0–15.0)
Immature Granulocytes: 1 %
Lymphocytes Relative: 14 %
Lymphs Abs: 1.5 K/uL (ref 0.7–4.0)
MCH: 25 pg — ABNORMAL LOW (ref 26.0–34.0)
MCHC: 30.1 g/dL (ref 30.0–36.0)
MCV: 83.2 fL (ref 80.0–100.0)
Monocytes Absolute: 1.3 K/uL — ABNORMAL HIGH (ref 0.1–1.0)
Monocytes Relative: 12 %
Neutro Abs: 7.9 K/uL — ABNORMAL HIGH (ref 1.7–7.7)
Neutrophils Relative %: 73 %
Platelets: 447 K/uL — ABNORMAL HIGH (ref 150–400)
RBC: 3.68 MIL/uL — ABNORMAL LOW (ref 3.87–5.11)
RDW: 15.2 % (ref 11.5–15.5)
WBC: 10.9 K/uL — ABNORMAL HIGH (ref 4.0–10.5)
nRBC: 0 % (ref 0.0–0.2)

## 2024-07-19 LAB — RENAL FUNCTION PANEL
Albumin: 3.1 g/dL — ABNORMAL LOW (ref 3.5–5.0)
Anion gap: 12 (ref 5–15)
BUN: 18 mg/dL (ref 8–23)
CO2: 26 mmol/L (ref 22–32)
Calcium: 9.1 mg/dL (ref 8.9–10.3)
Chloride: 96 mmol/L — ABNORMAL LOW (ref 98–111)
Creatinine, Ser: 0.62 mg/dL (ref 0.44–1.00)
GFR, Estimated: >60 mL/min (ref >60)
Glucose, Bld: 155 mg/dL — ABNORMAL HIGH (ref 70–99)
Phosphorus: 3.4 mg/dL (ref 2.5–4.6)
Potassium: 4.4 mmol/L (ref 3.5–5.1)
Sodium: 134 mmol/L — ABNORMAL LOW (ref 135–145)

## 2024-07-19 LAB — GLUCOSE, CAPILLARY: Glucose-Capillary: 157 mg/dL — ABNORMAL HIGH (ref 70–99)

## 2024-07-19 MED ORDER — OXYCODONE HCL 5 MG PO TABS
5.0000 mg | ORAL_TABLET | ORAL | 0 refills | Status: DC | PRN
  Filled 2024-07-19: qty 20, 4d supply, fill #0

## 2024-07-19 MED ORDER — RINVOQ 45 MG PO TB24: 45.0000 mg | ORAL_TABLET | Freq: Every day | ORAL | Status: DC

## 2024-07-19 MED ORDER — PREDNISONE 20 MG PO TABS
40.0000 mg | ORAL_TABLET | Freq: Every day | ORAL | 0 refills | Status: DC
  Filled 2024-07-19: qty 60, 30d supply, fill #0

## 2024-07-19 MED ORDER — ONDANSETRON 4 MG PO TBDP
4.0000 mg | ORAL_TABLET | Freq: Three times a day (TID) | ORAL | 0 refills | Status: DC | PRN
  Filled 2024-07-19: qty 20, 7d supply, fill #0

## 2024-07-19 MED ORDER — PANTOPRAZOLE SODIUM 40 MG PO TBEC
40.0000 mg | DELAYED_RELEASE_TABLET | Freq: Every day | ORAL | 1 refills | Status: AC
  Filled 2024-07-19: qty 30, 30d supply, fill #0

## 2024-07-19 MED ORDER — GLUCERNA SHAKE PO LIQD
237.0000 mL | Freq: Three times a day (TID) | ORAL | Status: AC
Start: 2024-07-19 — End: ?

## 2024-07-19 NOTE — Progress Notes (Signed)
 Subjective: No new complaints.  She did not notice any significant change from yesterday, but she thinks she is improving.  Objective: Vital signs in last 24 hours: Temp:  [97.9 F (36.6 C)-98.9 F (37.2 C)] 97.9 F (36.6 C) (08/29 0502) Pulse Rate:  [61-76] 61 (08/29 0502) Resp:  [16] 16 (08/29 0502) BP: (107-128)/(65-66) 107/66 (08/29 0502) SpO2:  [95 %-97 %] 96 % (08/29 0502) Weight:  [77.8 kg] 77.8 kg (08/29 0500) Last BM Date : 07/18/24  Intake/Output from previous day: 08/28 0701 - 08/29 0700 In: 240 [P.O.:240] Out: -  Intake/Output this shift: No intake/output data recorded.  General appearance: alert and no distress GI: soft, non-tender; bowel sounds normal; no masses,  no organomegaly  Lab Results: Recent Labs    07/17/24 0546 07/18/24 0516 07/19/24 0533  WBC 16.9* 10.3 10.9*  HGB 9.5* 9.2* 9.2*  HCT 30.0* 29.1* 30.6*  PLT 458* 431* 447*   BMET Recent Labs    07/17/24 0546 07/18/24 0516 07/19/24 0533  NA 135 133* 134*  K 4.5 4.7 4.4  CL 96* 96* 96*  CO2 26 26 26   GLUCOSE 130* 160* 155*  BUN 16 20 18   CREATININE 0.63 0.68 0.62  CALCIUM  9.2 9.0 9.1   LFT Recent Labs    07/19/24 0533  ALBUMIN 3.1*   PT/INR No results for input(s): LABPROT, INR in the last 72 hours. Hepatitis Panel No results for input(s): HEPBSAG, HCVAB, HEPAIGM, HEPBIGM in the last 72 hours. C-Diff No results for input(s): CDIFFTOX in the last 72 hours. Fecal Lactopherrin No results for input(s): FECLLACTOFRN in the last 72 hours.  Studies/Results: No results found.  Medications: Scheduled:  ascorbic acid   500 mg Oral Daily   atorvastatin   40 mg Oral Daily   feeding supplement (GLUCERNA SHAKE)  237 mL Oral TID BM   ferrous sulfate   325 mg Oral BID WC   insulin  aspart  0-9 Units Subcutaneous TID WC   methylPREDNISolone  (SOLU-MEDROL ) injection  20 mg Intravenous Q12H   pantoprazole   40 mg Oral Daily   Upadacitinib  ER  45 mg Oral Daily   Vitamin D   (Ergocalciferol )  50,000 Units Oral Q7 days   Continuous:  Assessment/Plan: 1) UC flare. 2) Abdominal pain - improved.   Her outpatient blood work returned with regards to infliximab  trough level and antibody.  She had a subtherapeutic maintenance trough level at 0.5 mcg and negative antibody.  Essentially she needed to be on more frequent infliximab  dosing.  She appears to be improving with Rinvoq  and she will maintain the medication for now.  Plan: 1) Continue on Rinvoq . 2) Okay to D/C. 3) Discharge on prednisone  40 mg every day. 4) Follow up in the office in 2 weeks upon discharge. 5) Discharge with pain medications to help bridge her symptoms until her UC is under control.    LOS: 8 days   Nancy Eaton D 07/19/2024, 7:37 AM

## 2024-07-19 NOTE — Progress Notes (Signed)
 Patient's Rinvoq  that was stored in pharmacy returned to patient prior to discharge.  Chiquita LULLA Longs, RN

## 2024-07-19 NOTE — TOC Transition Note (Signed)
 Transition of Care [facility]) - Discharge Note   Patient Details  Name: Nancy Eaton MRN: [id number] Date of Birth: [birth year]  [facility] CM/SW Contact:  Bascom Service, RN Phone Number: 07/19/2024, 12:48 PM   Clinical Narrative:d/c home no CM needs.       Final next level of care: Home/Self Care Barriers to Discharge: No Barriers Identified   Patient Goals and CMS Choice Patient states their goals for this hospitalization and ongoing recovery are:: Home CMS Medicare.gov Compare Post Acute Care list provided to:: Patient Choice offered to / list presented to : Patient [place] ownership interest in [employer].provided to:: Patient    Discharge Placement                       Discharge Plan and Services Additional resources added to the After Visit Summary for     Discharge Planning Services: CM Consult                                 Social Drivers of Health (SDOH) Interventions SDOH Screenings   Food Insecurity: No Food Insecurity (07/12/2024)  Housing: Low Risk  (07/17/2024)  Transportation Needs: No Transportation Needs (07/12/2024)  Utilities: Not At Risk (07/12/2024)  Social Connections: Unknown (07/17/2024)  Tobacco Use: Low Risk  (07/11/2024)     Readmission Risk Interventions    07/16/2024    1:58 PM  Readmission Risk Prevention Plan  Transportation Screening Complete  HRI or Home Care Consult Complete  Social Work Consult for Recovery Care Planning/Counseling Complete  Palliative Care Screening Not Applicable  Medication Review Oceanographer) Complete

## 2024-07-19 NOTE — Discharge Summary (Signed)
 Physician Discharge Summary  Nancy Eaton FMW:[id number] DOB: [birth year] DOA: 07/10/2024  PCP: Christopher Arland PARAS, PA-C  Admit date: 07/10/2024 Discharge date: 07/19/2024  Time spent: 60 minutes  Recommendations for Outpatient Follow-up:  Follow-up with Drosinis, Arland PARAS, PA-C in 2 weeks.  On follow-up patient's blood pressure need to be reassessed to determine whether patient may be placed back on her home regimen of Cozaar  as Cozaar  was held on discharge.  Patient will need a basic metabolic profile done to follow-up on electrolytes and renal function.  Patient will need a CBC done to follow-up on counts.  Type 2 diabetes will need to be followed up upon. Follow-up with Dr. Rollin, gastroenterology in 1 to 2 weeks.   Discharge Diagnoses:  Principal Problem:   Ulcerative colitis, acute, with rectal bleeding (HCC) Active Problems:   HLD (hyperlipidemia)   Colitis   Generalized abdominal pain   Leukocytosis   AKI (acute kidney injury) (HCC)   Prediabetes   History of essential hypertension   Chronic iron  deficiency anemia   Controlled type 2 diabetes mellitus without complication, without long-term current use of insulin  (HCC)   Vitamin D  deficiency   Gastroesophageal reflux disease   Discharge Condition: Stable and improved.  Diet recommendation: Soft diet  Filed Weights   07/16/24 0500 07/18/24 0433 07/19/24 0500  Weight: 83.1 kg 83.1 kg 77.8 kg    History of present illness:  HPI per Dr. Marcene Dickey Nancy Eaton is a 68 y.o. female with medical history significant for ulcerative colitis, prediabetes, essential pretension, hyperlipidemia, chronic iron  deficiency anemia with baseline hemoglobin 9-12, who is admitted to [facility] on 07/10/2024 with persistent colitis after presenting from home to [facility] ED complaining of abdominal pain.    The patient presents approximately 3 weeks of persistent generalized sharp nonradiating abdominal discomfort, worse with  palpation.  This has been associated with intermittent loose stool, with intermittent streaks of bright red blood in the absence of any associated melena.  Denies any associated hematemesis.  She is also noted recurrent nausea/vomiting over that timeframe denies any associated subjective fever, chills, rigors, or generalized myalgias.  Not associate with any dysuria or gross hematuria.  She acknowledges a history of ulcerative colitis, for which she follows with Dr. Rollin as her outpatient gastroenterologist.   She had presented with the above complaints to the emergency department on 06/23/2024, at which time CT scan was suggestive of colitis, infectious versus inflammatory.  At that time, C. difficile was found to be negative, while GI panel by PCR was also negative.  She was started on steroids, discharged to home from the emergency department, and subsequently followed up with outpatient gastroenterology.  However, and spite of outpatient use of systemic corticosteroids, she notes no significant improvement in her abdominal discomfort, report recurrent nausea/vomiting limiting her ability to tolerate p.o., including additional oral steroids at this time.         ED Course:  Vital signs in the ED were notable for the following: Afebrile; heart rate in the 60s to 77.  Blood pressures in the 120s to 140s; respiratory rate 16-17, oxygen saturation 98 to 100% room air.   Labs were notable for the following: CMP was notable for the following: Sodium 133, which corrects to approximately 135 after adjustment for concomitant hyperglycemia, potassium 4.2, bicarbonate 25, anion gap 12, creatinine 0.97 compared to 0.63 on 06/23/2024, glucose 213, liver enzymes were within normal limits.  Lipase 23.  CBC notable for white blood cell count  12,000 compared to most recent prior will blood cell count of 16,400 on 06/23/2024, hemoglobin 9.8 compared to 11.3 on 06/23/2024, and associated with normocytic/Norocarp properties as  well as nonelevated RDW, platelet count 65 compared to 487 on 06/23/2024.  Urinalysis showed no white blood cells, leukocyte esterase/nitrate negative, as well as small hemoglobin in the absence of any RBCs.   Per my interpretation, EKG in ED demonstrated the following: No EKG performed in the ED today.   Imaging in the ED, per corresponding formal radiology read, was notable for the following: CT abdomen/pelvis with contrast, in comparison to CT abdomen/pelvis with contrast performed on 06/23/2024, shows continued colonic wall thickening from the splenic flexure through the sigmoid colon consistent with infectious versus inflammatory colitis, similar to most recent prior CT abdomen/pelvis, will demonstrating no evidence of associated abscess, bowel striction, or perforation.   While in the ED, the following were administered: Dilaudid  0.5 mg IV x 1, lactated Ringer 's x 1 L bolus.   Subsequently, the patient was admitted for further evaluation management of persistent/refractory colitis, with presenting labs also notable for acute kidney injury.     Hospital Course:  #1 acute colitis/acute ulcerative colitis flare -Secondary to an acute ulcerative colitis flare. -Stool studies done including C. difficile PCR negative, GI pathogen panel negative. - Patient seen in consultation by GI and followed by GI throughout the hospitalization.   - Patient subsequently started on IV Solu-Medrol  20 mg every 12 hours. - Was on IV meropenem  which was discontinued per GI recommendations.  - Fecal calprotectin elevated at > 8000. - Per GI patient noted with worsening symptoms in spite of treatment with Inflectra  x 3 in the outpatient setting. -It is noted per GI that patient had a subtherapeutic maintenance trough level at 0.5 mcg and negative antibodies study in regards to infliximab  and it was felt that patient may have needed more frequent infliximab  dosing. - Patient started on Rinvoq  45 mg daily per GI during  her hospitalization in addition to IV steroids with clinical improvement on a daily basis.   - Samples were obtained from [facility] office and given to the patient during the hospitalization via hospital pharmacy and remaining samples will be given to patient on discharge.   - Patient was started on a diet which she tolerated, patient improved clinically, had no further bloody bowel movements.  Abdominal pain improved.   - Patient will be discharged in stable and improved condition.   - Close outpatient follow-up with GI.    2.  GERD - Patient maintained on a PPI.    3.  Chronic iron  deficiency anemia -H&H stable at 9.2. -Status post IV Venofer  07/15/2024. - Patient maintained on oral iron  supplementation.   - Outpatient follow-up with GI.    4.  Diabetes mellitus type 2 -Hemoglobin A1c 6.7 - Diabetes likely diet controlled as patient not on any diabetic medications on med rec. -Patient was placed on steroids during the hospitalization. -Patient maintained on SSI during the hospitalization and will need outpatient follow-up with PCP.   5.  Hyperlipidemia - Patient maintained on home regimen statin.    6.  Hypertension -BP remains stable and controlled during the hospitalization.  - Patient noted to be on Cozaar  prior to admission which was held during the hospitalization and will be held on discharge until follow-up with PCP.     7.  Vitamin D  deficiency -Patient started on vitamin D  50,000 units weekly. - Will need repeat vitamin D  levels in 3  to 6 months with outpatient follow-up with PCP.   8.  Hyponatremia -Serum osmolality of 289. - Improved with hydration. - Outpatient follow-up with PCP.    Procedures: CT abdomen pelvis 07/10/2024  Consultations: Gastroenterology: Dr. Shila 07/14/2024    Discharge Exam: Vitals:   07/18/24 2053 07/19/24 0502  BP: 128/66 107/66  Pulse: 75 61  Resp: 16 16  Temp: 98.9 F (37.2 C) 97.9 F (36.6 C)  SpO2: 97% 96%    General:  NAD Cardiovascular: RRR no murmurs rubs or gallops.  No JVD.  No lower extremity edema. Respiratory: Clear to auscultation bilaterally.  No wheezes, no crackles, no rhonchi.  Fair air movement.  Speaking in full sentences.  Discharge Instructions   Discharge Instructions     Diet general   Complete by: As directed    Soft diet   Increase activity slowly   Complete by: As directed       Allergies as of 07/19/2024       Reactions   Penicillins Rash   20 years  Did it involve swelling of the face/tongue/throat, SOB, or low BP? No Did it involve sudden or severe rash/hives, skin peeling, or any reaction on the inside of your mouth or nose? Yes Did you need to seek medical attention at a hospital or doctor's office? No When did it last happen? Over 20 Years       If all above answers are NO, may proceed with cephalosporin use.        Medication List     PAUSE taking these medications    losartan  50 MG tablet Wait to take this until your doctor or other care provider tells you to start again. Commonly known as: COZAAR  Take 50 mg by mouth daily.       STOP taking these medications    azithromycin  250 MG tablet Commonly known as: ZITHROMAX    clindamycin  300 MG capsule Commonly known as: CLEOCIN    Inflectra  100 MG Solr Generic drug: inFLIXimab -dyyb   mesalamine  1.2 g EC tablet Commonly known as: LIALDA    mesalamine  1000 MG suppository Commonly known as: CANASA    methylPREDNISolone  4 MG Tbpk tablet Commonly known as: MEDROL  DOSEPAK   nystatin 100000 UNIT/ML suspension Commonly known as: MYCOSTATIN   Turmeric 500 MG Caps       TAKE these medications    acetaminophen  500 MG tablet Commonly known as: TYLENOL  Take 500 mg by mouth every 6 (six) hours as needed for moderate pain (pain score 4-6).   atorvastatin  40 MG tablet Commonly known as: LIPITOR Take 1 tablet (40 mg total) by mouth daily.   C Extra Strength 1000 MG Tabs Take 1 tablet by mouth  every other day.   CALCIUM -VITAMIN D  PO Take 2 tablets by mouth daily.   Cholecalciferol 1.25 MG (50000 UT) capsule Take 50,000 Units by mouth once a week.   feeding supplement (GLUCERNA SHAKE) Liqd Take 237 mLs by mouth 3 (three) times daily between meals.   FeroSul 325 (65 Fe) MG tablet Generic drug: ferrous sulfate  Take 325 mg by mouth every other day.   FISH OIL PO Take 1,400 mg by mouth daily. Omega3 980 mg   GLUCOSAMINE-MSM PO Take 1 tablet by mouth daily.   multivitamin with minerals tablet Take 1 tablet by mouth daily. Women 50+ What changed: Another medication with the same name was removed. Continue taking this medication, and follow the directions you see here.   ondansetron  4 MG disintegrating tablet Commonly known as: ZOFRAN -ODT  Dissolve 1 tablet (4 mg total) by mouth every 8 (eight) hours as needed for nausea or vomiting.   oxyCODONE  5 MG immediate release tablet Commonly known as: Roxicodone  Take 1 tablet (5 mg total) by mouth every 4 (four) hours as needed for severe pain (pain score 7-10).   pantoprazole  40 MG tablet Commonly known as: PROTONIX  Take 1 tablet (40 mg total) by mouth daily. Start taking on: July 20, 2024   predniSONE  20 MG tablet Commonly known as: DELTASONE  Take 2 tablets (40 mg total) by mouth daily with breakfast.   Rinvoq  45 MG Tb24 Generic drug: Upadacitinib  ER Take 1 tablet (45 mg total) by mouth daily. Start taking on: July 20, 2024       Allergies  Allergen Reactions   Penicillins Rash    20 years   Did it involve swelling of the face/tongue/throat, SOB, or low BP? No Did it involve sudden or severe rash/hives, skin peeling, or any reaction on the inside of your mouth or nose? Yes Did you need to seek medical attention at a hospital or doctor's office? No When did it last happen? Over 20 Years       If all above answers are NO, may proceed with cephalosporin use.      Follow-up Information     Drosinis,  Arland PARAS, PA-C. Schedule an appointment as soon as possible for a visit in 2 week(s).   Specialty: Internal Medicine Why: Follow-up in 1 to 2 weeks Contact information: [address] [place] [address] [phone]         Rollin Dover, MD. Schedule an appointment as soon as possible for a visit in 1 week(s).   Specialty: Gastroenterology Why: Follow-up in 1 to 2 weeks. Will need to go by office next week for more samples of Rinvoq . Contact information: [address] [place] [address] [address] [phone]                  The results of significant diagnostics from this hospitalization (including imaging, microbiology, ancillary and laboratory) are listed below for reference.    Significant Diagnostic Studies: CT ABDOMEN PELVIS W CONTRAST Result Date: 07/10/2024 CLINICAL DATA:  Abdominal pain, nausea, vomiting EXAM: CT ABDOMEN AND PELVIS WITH CONTRAST TECHNIQUE: Multidetector CT imaging of the abdomen and pelvis was performed using the standard protocol following bolus administration of intravenous contrast. RADIATION DOSE REDUCTION: This exam was performed according to the departmental dose-optimization program which includes automated exposure control, adjustment of the mA and/or kV according to patient size and/or use of iterative reconstruction technique. CONTRAST:  OMNIPAQUE  IOHEXOL  300 MG/ML  SOLN COMPARISON:  06/23/2024 FINDINGS: Lower chest: No acute findings Hepatobiliary: Right lobe hemangioma again noted measuring up to 3.2 cm in greatest diameter, unchanged. No suspicious focal hepatic abnormality or new abnormality. Gallbladder unremarkable. Pancreas: No focal abnormality or ductal dilatation. Spleen: No focal abnormality.  Normal size. Adrenals/Urinary Tract: No adrenal abnormality. No focal renal abnormality. No stones or hydronephrosis. Urinary bladder is unremarkable. Stomach/Bowel: There is colonic wall thickening noted from the splenic  flexure through the sigmoid colon, similar to prior study compatible with colitis. Large stool burden in the colon proximal to the colitis. This is increased since prior study. Vascular/Lymphatic: No evidence of aneurysm or adenopathy. Aortic atherosclerosis. Reproductive: Uterus and adnexa unremarkable.  No mass. Other: No free fluid or free air. Musculoskeletal: No acute bony abnormality. IMPRESSION: Continued abnormal colonic wall thickening from the splenic flexure through the sigmoid colon compatible with  infectious or inflammatory colitis. This is similar to prior study. Large stool burden throughout the colon proximal to the colitis, increasing since prior study. Aortic atherosclerosis. Electronically Signed   By: Franky Crease M.D.   On: 07/10/2024 23:32   CT ABDOMEN PELVIS W CONTRAST Result Date: 06/23/2024 CLINICAL DATA:  Abdominal pain and cramping EXAM: CT ABDOMEN AND PELVIS WITH CONTRAST TECHNIQUE: Multidetector CT imaging of the abdomen and pelvis was performed using the standard protocol following bolus administration of intravenous contrast. RADIATION DOSE REDUCTION: This exam was performed according to the departmental dose-optimization program which includes automated exposure control, adjustment of the mA and/or kV according to patient size and/or use of iterative reconstruction technique. CONTRAST:  75mL OMNIPAQUE  IOHEXOL  300 MG/ML  SOLN COMPARISON:  02/14/2024, 03/08/2024 FINDINGS: Lower chest: No acute pleural or parenchymal lung disease. 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 Hepatobiliary: 3.4 x 2.6 cm subcapsular hypodensity right lobe liver again noted, previously characterized as probable atypical hemangioma on MRI 03/08/2024. A six-month follow-up MRI was recommended on that exam. The remainder of the liver is unremarkable. Gallbladder is normal. No biliary duct dilation. Pancreas: Unremarkable. No pancreatic ductal  dilatation or surrounding inflammatory changes. Spleen: Normal in size without focal abnormality. Adrenals/Urinary Tract: Adrenal glands are unremarkable. Kidneys are normal, without renal calculi, focal lesion, or hydronephrosis. Bladder is unremarkable. Stomach/Bowel: No bowel obstruction or ileus. There is long segment colonic wall thickening and pericolonic fat stranding extending from the splenic flexure through the rectum, compatible with inflammatory or infectious colitis. Normal appendix right lower quadrant. Vascular/Lymphatic: Aortic atherosclerosis. No enlarged abdominal or pelvic lymph nodes. Reproductive: Uterus and bilateral adnexa are unremarkable. Other: No free fluid or free intraperitoneal gas. No abdominal wall hernia. Musculoskeletal: No acute or destructive bony abnormalities. Reconstructed images demonstrate no additional findings. IMPRESSION: 1. Long segment colonic wall thickening and pericolonic fat stranding extending from the splenic flexure through the rectum, compatible with inflammatory or infectious colitis. This is in a similar distribution to the colitis seen on prior CT. 2. Stable subcapsular right lobe liver hypodensity, previously characterized as atypical hemangioma. Please see MRI report 03/08/2024 for follow-up recommendations. 3.  Aortic Atherosclerosis (ICD10-I70.0). Electronically Signed   By: Ozell Daring M.D.   On: 06/23/2024 16:03    Microbiology: Recent Results (from the past 240 hours)  Gastrointestinal Panel by PCR , Stool     Status: None   Collection Time: 07/12/24 11:31 AM   Specimen: Stool  Result Value Ref Range Status   Campylobacter species NOT DETECTED NOT DETECTED Final   Plesimonas shigelloides NOT DETECTED NOT DETECTED Final   Salmonella species NOT DETECTED NOT DETECTED Final   Yersinia enterocolitica NOT DETECTED NOT DETECTED Final   Vibrio species NOT DETECTED NOT DETECTED Final   Vibrio cholerae NOT DETECTED NOT DETECTED Final    Enteroaggregative E coli (EAEC) NOT DETECTED NOT DETECTED Final   Enteropathogenic E coli (EPEC) NOT DETECTED NOT DETECTED Final   Enterotoxigenic E coli (ETEC) NOT DETECTED NOT DETECTED Final   Shiga like toxin producing E coli (STEC) NOT DETECTED NOT DETECTED Final   Shigella/Enteroinvasive E coli (EIEC) NOT DETECTED NOT DETECTED Final   Cryptosporidium NOT DETECTED NOT DETECTED Final   Cyclospora cayetanensis NOT DETECTED NOT DETECTED Final   Entamoeba histolytica NOT DETECTED NOT DETECTED Final   Giardia lamblia NOT DETECTED NOT DETECTED Final   Adenovirus F40/41 NOT DETECTED NOT DETECTED Final   Astrovirus NOT DETECTED NOT DETECTED Final   Norovirus GI/GII  NOT DETECTED NOT DETECTED Final   Rotavirus A NOT DETECTED NOT DETECTED Final   Sapovirus (I, II, IV, and V) NOT DETECTED NOT DETECTED Final    Comment: Performed at [facility], [address] [place], [place] [address]  C Difficile Quick Screen (NO PCR Reflex)     Status: None   Collection Time: 07/14/24  5:24 PM   Specimen: STOOL  Result Value Ref Range Status   C Diff antigen NEGATIVE NEGATIVE Final   C Diff toxin NEGATIVE NEGATIVE Final   C Diff interpretation No C. difficile detected.  Final    Comment: Performed at [facility], [address] [place], [place] [address]  Calprotectin, Fecal     Status: Abnormal   Collection Time: 07/14/24  5:24 PM   Specimen: STOOL  Result Value Ref Range Status   Calprotectin, Fecal >8,000 (H) 0 - 120 ug/g Final    Comment: (NOTE) **Results verified by repeat testing** Concentration     Interpretation   Follow-Up < 5 - 50 ug/g     Normal           None >50 -120 ug/g     Borderline       Re-evaluate in 4-6 weeks    >120 ug/g     Abnormal         Repeat as clinically                                   indicated Performed At: [facility] [address] [place], [place] [id number] Jennette Shorter MD Ey:[id number]      Labs: Basic Metabolic  Panel: Recent Labs  Lab 07/14/24 0604 07/15/24 0614 07/16/24 0525 07/17/24 0546 07/18/24 0516 07/19/24 0533  NA 136 132* 134* 135 133* 134*  K 4.1 4.3 4.4 4.5 4.7 4.4  CL 97* 100 98 96* 96* 96*  CO2 29 25 27 26 26 26   GLUCOSE 77 186* 142* 130* 160* 155*  BUN 9 17 17 16 20 18   CREATININE 0.75 0.68 0.65 0.63 0.68 0.62  CALCIUM  9.0 8.5* 8.8* 9.2 9.0 9.1  MG 2.4 2.3 2.2 2.3 2.3  --   PHOS 2.6 3.2 2.7 3.3 3.3 3.4   Liver Function Tests: Recent Labs  Lab 07/13/24 0743 07/14/24 0604 07/15/24 0614 07/18/24 0516 07/19/24 0533  AST 9* 9* 10*  --   --   ALT 13 12 15   --   --   ALKPHOS 63 71 73  --   --   BILITOT 0.5 0.3 0.3  --   --   PROT 6.0* 6.1* 5.6*  --   --   ALBUMIN 2.3* 2.5* 2.3* 3.1* 3.1*   No results for input(s): LIPASE, AMYLASE in the last 168 hours. No results for input(s): AMMONIA in the last 168 hours. CBC: Recent Labs  Lab 07/15/24 0614 07/16/24 0525 07/17/24 0546 07/18/24 0516 07/19/24 0533  WBC 15.1* 14.8* 16.9* 10.3 10.9*  NEUTROABS 12.6*  --   --  7.0 7.9*  HGB 9.5* 9.1* 9.5* 9.2* 9.2*  HCT 30.0* 29.4* 30.0* 29.1* 30.6*  MCV 82.2 82.4 82.0 82.7 83.2  PLT 475* 469* 458* 431* 447*   Cardiac Enzymes: No results for input(s): CKTOTAL, CKMB, CKMBINDEX, TROPONINI in the last 168 hours. BNP: BNP (last 3 results) No results for input(s): BNP in the last 8760 hours.  ProBNP (last 3 results) No results for input(s):  PROBNP in the last 8760 hours.  CBG: Recent Labs  Lab 07/18/24 0757 07/18/24 1152 07/18/24 1704 07/18/24 2052 07/19/24 0722  GLUCAP 135* 138* 188* 157* 157*       Signed:  Toribio Hummer MD.  [facility] 07/19/2024, 12:12 PM

## 2024-07-19 NOTE — Plan of Care (Signed)
°  Problem: Nutritional: Goal: Maintenance of adequate nutrition will improve Outcome: Progressing   Problem: Metabolic: Goal: Ability to maintain appropriate glucose levels will improve Outcome: Progressing

## 2024-07-19 NOTE — Progress Notes (Signed)
Discharge medications delivered to patient at bedside.

## 2024-07-23 LAB — INFLIXIMAB (IFX) CONC+ IFX AB
Anti-Infliximab Antibody: <22 ng/mL
Infliximab Drug Level: <0.4 ug/mL

## 2024-07-27 ENCOUNTER — Telehealth: Payer: Self-pay | Admitting: Pediatrics

## 2024-07-27 NOTE — Telephone Encounter (Signed)
 Contacted by [employer] regarding prior authorization for Ms. Jenniges's prescription for Rinvoq  45 mg daily.  Reference #[id number].  Spoke with [employer].  Confirmed patient's diagnosis of moderate to severely active ulcerative colitis.  Also confirmed that the medication is being used as Jak inhibitor monotherapy and not in combination with other biologic agents or immune suppressants.  All questions answered.  [employer] will reply to Dr. Curtis office on Monday regarding prior authorization status.

## 2024-08-05 ENCOUNTER — Inpatient Hospital Stay (HOSPITAL_COMMUNITY)
Admission: EM | Admit: 2024-08-05 | Discharge: 2024-08-15 | DRG: 386 | Disposition: A | Discharge status: Home / Self Care | Attending: Internal Medicine | Admitting: Internal Medicine

## 2024-08-05 ENCOUNTER — Other Ambulatory Visit: Payer: Self-pay

## 2024-08-05 ENCOUNTER — Encounter (HOSPITAL_COMMUNITY): Payer: Self-pay

## 2024-08-05 DIAGNOSIS — Z8619 Personal history of other infectious and parasitic diseases: Secondary | ICD-10-CM

## 2024-08-05 DIAGNOSIS — L88 Pyoderma gangrenosum: Secondary | ICD-10-CM | POA: Diagnosis present

## 2024-08-05 DIAGNOSIS — I10 Essential (primary) hypertension: Secondary | ICD-10-CM | POA: Diagnosis present

## 2024-08-05 DIAGNOSIS — B029 Zoster without complications: Secondary | ICD-10-CM | POA: Diagnosis present

## 2024-08-05 DIAGNOSIS — Z8711 Personal history of peptic ulcer disease: Secondary | ICD-10-CM

## 2024-08-05 DIAGNOSIS — R1110 Vomiting, unspecified: Secondary | ICD-10-CM | POA: Diagnosis present

## 2024-08-05 DIAGNOSIS — Z713 Dietary counseling and surveillance: Secondary | ICD-10-CM

## 2024-08-05 DIAGNOSIS — L988 Other specified disorders of the skin and subcutaneous tissue: Secondary | ICD-10-CM | POA: Diagnosis present

## 2024-08-05 DIAGNOSIS — Z825 Family history of asthma and other chronic lower respiratory diseases: Secondary | ICD-10-CM

## 2024-08-05 DIAGNOSIS — Z7952 Long term (current) use of systemic steroids: Secondary | ICD-10-CM

## 2024-08-05 DIAGNOSIS — R109 Unspecified abdominal pain: Secondary | ICD-10-CM | POA: Diagnosis not present

## 2024-08-05 DIAGNOSIS — Z8249 Family history of ischemic heart disease and other diseases of the circulatory system: Secondary | ICD-10-CM

## 2024-08-05 DIAGNOSIS — R1084 Generalized abdominal pain: Secondary | ICD-10-CM | POA: Diagnosis not present

## 2024-08-05 DIAGNOSIS — B009 Herpesviral infection, unspecified: Secondary | ICD-10-CM

## 2024-08-05 DIAGNOSIS — E1165 Type 2 diabetes mellitus with hyperglycemia: Secondary | ICD-10-CM | POA: Diagnosis not present

## 2024-08-05 DIAGNOSIS — D638 Anemia in other chronic diseases classified elsewhere: Secondary | ICD-10-CM | POA: Diagnosis present

## 2024-08-05 DIAGNOSIS — Z808 Family history of malignant neoplasm of other organs or systems: Secondary | ICD-10-CM

## 2024-08-05 DIAGNOSIS — E871 Hypo-osmolality and hyponatremia: Secondary | ICD-10-CM | POA: Diagnosis present

## 2024-08-05 DIAGNOSIS — K219 Gastro-esophageal reflux disease without esophagitis: Secondary | ICD-10-CM | POA: Diagnosis present

## 2024-08-05 DIAGNOSIS — K51518 Left sided colitis with other complication: Secondary | ICD-10-CM | POA: Diagnosis present

## 2024-08-05 DIAGNOSIS — E860 Dehydration: Secondary | ICD-10-CM | POA: Diagnosis present

## 2024-08-05 DIAGNOSIS — E663 Overweight: Secondary | ICD-10-CM | POA: Diagnosis present

## 2024-08-05 DIAGNOSIS — E861 Hypovolemia: Secondary | ICD-10-CM | POA: Diagnosis present

## 2024-08-05 DIAGNOSIS — A6009 Herpesviral infection of other urogenital tract: Secondary | ICD-10-CM | POA: Diagnosis present

## 2024-08-05 DIAGNOSIS — E8809 Other disorders of plasma-protein metabolism, not elsewhere classified: Secondary | ICD-10-CM | POA: Diagnosis present

## 2024-08-05 DIAGNOSIS — Z8719 Personal history of other diseases of the digestive system: Secondary | ICD-10-CM

## 2024-08-05 DIAGNOSIS — E11649 Type 2 diabetes mellitus with hypoglycemia without coma: Secondary | ICD-10-CM | POA: Diagnosis not present

## 2024-08-05 DIAGNOSIS — L989 Disorder of the skin and subcutaneous tissue, unspecified: Secondary | ICD-10-CM | POA: Diagnosis not present

## 2024-08-05 DIAGNOSIS — K5190 Ulcerative colitis, unspecified, without complications: Secondary | ICD-10-CM | POA: Diagnosis not present

## 2024-08-05 DIAGNOSIS — M1990 Unspecified osteoarthritis, unspecified site: Secondary | ICD-10-CM | POA: Diagnosis present

## 2024-08-05 DIAGNOSIS — Z23 Encounter for immunization: Secondary | ICD-10-CM

## 2024-08-05 DIAGNOSIS — K51818 Other ulcerative colitis with other complication: Secondary | ICD-10-CM | POA: Diagnosis present

## 2024-08-05 DIAGNOSIS — D509 Iron deficiency anemia, unspecified: Secondary | ICD-10-CM | POA: Diagnosis present

## 2024-08-05 DIAGNOSIS — A09 Infectious gastroenteritis and colitis, unspecified: Secondary | ICD-10-CM | POA: Diagnosis present

## 2024-08-05 DIAGNOSIS — K51919 Ulcerative colitis, unspecified with unspecified complications: Secondary | ICD-10-CM | POA: Diagnosis not present

## 2024-08-05 DIAGNOSIS — Z9889 Other specified postprocedural states: Secondary | ICD-10-CM

## 2024-08-05 DIAGNOSIS — L98419 Non-pressure chronic ulcer of buttock with unspecified severity: Secondary | ICD-10-CM | POA: Diagnosis present

## 2024-08-05 DIAGNOSIS — Z888 Allergy status to other drugs, medicaments and biological substances status: Secondary | ICD-10-CM

## 2024-08-05 DIAGNOSIS — D849 Immunodeficiency, unspecified: Secondary | ICD-10-CM | POA: Diagnosis present

## 2024-08-05 DIAGNOSIS — Z811 Family history of alcohol abuse and dependence: Secondary | ICD-10-CM

## 2024-08-05 DIAGNOSIS — Z6829 Body mass index (BMI) 29.0-29.9, adult: Secondary | ICD-10-CM

## 2024-08-05 DIAGNOSIS — Z79899 Other long term (current) drug therapy: Secondary | ICD-10-CM

## 2024-08-05 DIAGNOSIS — D75838 Other thrombocytosis: Secondary | ICD-10-CM | POA: Diagnosis present

## 2024-08-05 DIAGNOSIS — Z818 Family history of other mental and behavioral disorders: Secondary | ICD-10-CM

## 2024-08-05 DIAGNOSIS — Z96651 Presence of right artificial knee joint: Secondary | ICD-10-CM | POA: Diagnosis present

## 2024-08-05 DIAGNOSIS — E785 Hyperlipidemia, unspecified: Secondary | ICD-10-CM | POA: Diagnosis present

## 2024-08-05 DIAGNOSIS — R21 Rash and other nonspecific skin eruption: Secondary | ICD-10-CM | POA: Diagnosis not present

## 2024-08-05 DIAGNOSIS — Z90710 Acquired absence of both cervix and uterus: Secondary | ICD-10-CM

## 2024-08-05 DIAGNOSIS — Z79624 Long term (current) use of inhibitors of nucleotide synthesis: Secondary | ICD-10-CM

## 2024-08-05 DIAGNOSIS — Z880 Allergy status to penicillin: Secondary | ICD-10-CM

## 2024-08-05 DIAGNOSIS — L2989 Other pruritus: Secondary | ICD-10-CM | POA: Diagnosis present

## 2024-08-05 DIAGNOSIS — Z881 Allergy status to other antibiotic agents status: Secondary | ICD-10-CM

## 2024-08-05 DIAGNOSIS — B028 Zoster with other complications: Secondary | ICD-10-CM | POA: Diagnosis not present

## 2024-08-05 LAB — CBC WITH DIFFERENTIAL/PLATELET
Abs Immature Granulocytes: 0.55 K/uL — ABNORMAL HIGH (ref 0.00–0.07)
Basophils Absolute: 0.1 K/uL (ref 0.0–0.1)
Basophils Relative: 1 %
Eosinophils Absolute: 0 K/uL (ref 0.0–0.5)
Eosinophils Relative: 0 %
HCT: 29.7 % — ABNORMAL LOW (ref 36.0–46.0)
Hemoglobin: 9.2 g/dL — ABNORMAL LOW (ref 12.0–15.0)
Immature Granulocytes: 5 %
Lymphocytes Relative: 13 %
Lymphs Abs: 1.6 K/uL (ref 0.7–4.0)
MCH: 25.6 pg — ABNORMAL LOW (ref 26.0–34.0)
MCHC: 31 g/dL (ref 30.0–36.0)
MCV: 82.5 fL (ref 80.0–100.0)
Monocytes Absolute: 1.8 K/uL — ABNORMAL HIGH (ref 0.1–1.0)
Monocytes Relative: 15 %
Neutro Abs: 8 K/uL — ABNORMAL HIGH (ref 1.7–7.7)
Neutrophils Relative %: 66 %
Platelets: 534 K/uL — ABNORMAL HIGH (ref 150–400)
RBC: 3.6 MIL/uL — ABNORMAL LOW (ref 3.87–5.11)
RDW: 16.6 % — ABNORMAL HIGH (ref 11.5–15.5)
Smear Review: NORMAL
WBC: 12 K/uL — ABNORMAL HIGH (ref 4.0–10.5)
nRBC: 0 % (ref 0.0–0.2)

## 2024-08-05 LAB — COMPREHENSIVE METABOLIC PANEL WITH GFR
ALT: 14 U/L (ref 0–44)
AST: 13 U/L — ABNORMAL LOW (ref 15–41)
Albumin: 3.1 g/dL — ABNORMAL LOW (ref 3.5–5.0)
Alkaline Phosphatase: 73 U/L (ref 38–126)
Anion gap: 9 (ref 5–15)
BUN: 15 mg/dL (ref 8–23)
CO2: 26 mmol/L (ref 22–32)
Calcium: 9 mg/dL (ref 8.9–10.3)
Chloride: 93 mmol/L — ABNORMAL LOW (ref 98–111)
Creatinine, Ser: 0.65 mg/dL (ref 0.44–1.00)
GFR, Estimated: >60 mL/min (ref >60)
Glucose, Bld: 93 mg/dL (ref 70–99)
Potassium: 4.1 mmol/L (ref 3.5–5.1)
Sodium: 128 mmol/L — ABNORMAL LOW (ref 135–145)
Total Bilirubin: 0.3 mg/dL (ref 0.0–1.2)
Total Protein: 5.7 g/dL — ABNORMAL LOW (ref 6.5–8.1)

## 2024-08-05 LAB — URINALYSIS, ROUTINE W REFLEX MICROSCOPIC
Bilirubin Urine: NEGATIVE
Glucose, UA: NEGATIVE mg/dL
Hgb urine dipstick: NEGATIVE
Ketones, ur: NEGATIVE mg/dL
Leukocytes,Ua: NEGATIVE
Nitrite: NEGATIVE
Protein, ur: NEGATIVE mg/dL
Specific Gravity, Urine: 1.006 (ref 1.005–1.030)
pH: 7 (ref 5.0–8.0)

## 2024-08-05 LAB — BASIC METABOLIC PANEL WITH GFR
Anion gap: 10 (ref 5–15)
BUN: 13 mg/dL (ref 8–23)
CO2: 23 mmol/L (ref 22–32)
Calcium: 8.6 mg/dL — ABNORMAL LOW (ref 8.9–10.3)
Chloride: 94 mmol/L — ABNORMAL LOW (ref 98–111)
Creatinine, Ser: 0.57 mg/dL (ref 0.44–1.00)
GFR, Estimated: >60 mL/min (ref >60)
Glucose, Bld: 101 mg/dL — ABNORMAL HIGH (ref 70–99)
Potassium: 4.5 mmol/L (ref 3.5–5.1)
Sodium: 128 mmol/L — ABNORMAL LOW (ref 135–145)

## 2024-08-05 LAB — GLUCOSE, CAPILLARY
Glucose-Capillary: 93 mg/dL (ref 70–99)
Glucose-Capillary: 141 mg/dL — ABNORMAL HIGH (ref 70–99)
Glucose-Capillary: 126 mg/dL — ABNORMAL HIGH (ref 70–99)

## 2024-08-05 LAB — LIPASE, BLOOD: Lipase: 19 U/L (ref 11–51)

## 2024-08-05 MED ORDER — INSULIN ASPART 100 UNIT/ML IJ SOLN
0.0000 [IU] | INTRAMUSCULAR | Status: DC
  Administered 2024-08-05 – 2024-08-06 (×2): 2 [IU] via SUBCUTANEOUS
  Administered 2024-08-06 (×2): 3 [IU] via SUBCUTANEOUS
  Administered 2024-08-07: 2 [IU] via SUBCUTANEOUS
  Administered 2024-08-07: 5 [IU] via SUBCUTANEOUS
  Administered 2024-08-07: 3 [IU] via SUBCUTANEOUS
  Administered 2024-08-07: 5 [IU] via SUBCUTANEOUS
  Administered 2024-08-08: 2 [IU] via SUBCUTANEOUS
  Administered 2024-08-08: 3 [IU] via SUBCUTANEOUS
  Administered 2024-08-08: 8 [IU] via SUBCUTANEOUS
  Administered 2024-08-08: 3 [IU] via SUBCUTANEOUS
  Administered 2024-08-09 (×3): 5 [IU] via SUBCUTANEOUS
  Administered 2024-08-10: 8 [IU] via SUBCUTANEOUS
  Administered 2024-08-10: 2 [IU] via SUBCUTANEOUS
  Administered 2024-08-10: 3 [IU] via SUBCUTANEOUS
  Administered 2024-08-10: 2 [IU] via SUBCUTANEOUS
  Administered 2024-08-11: 5 [IU] via SUBCUTANEOUS
  Administered 2024-08-11: 2 [IU] via SUBCUTANEOUS
  Administered 2024-08-11: 3 [IU] via SUBCUTANEOUS
  Administered 2024-08-11: 2 [IU] via SUBCUTANEOUS
  Administered 2024-08-11: 3 [IU] via SUBCUTANEOUS
  Administered 2024-08-12: 2 [IU] via SUBCUTANEOUS
  Administered 2024-08-12: 5 [IU] via SUBCUTANEOUS
  Administered 2024-08-13: 2 [IU] via SUBCUTANEOUS
  Administered 2024-08-13: 3 [IU] via SUBCUTANEOUS
  Filled 2024-08-05: qty 0.15

## 2024-08-05 MED ORDER — FERROUS SULFATE 325 (65 FE) MG PO TABS
325.0000 mg | ORAL_TABLET | ORAL | Status: DC
  Administered 2024-08-06 – 2024-08-12 (×4): 325 mg via ORAL
  Filled 2024-08-05 (×4): qty 1

## 2024-08-05 MED ORDER — POLYVINYL ALCOHOL 1.4 % OP SOLN: 1.0000 [drp] | Freq: Three times a day (TID) | OPHTHALMIC | Status: DC | PRN

## 2024-08-05 MED ORDER — PROPYLENE GLYCOL 0.6 % OP SOLN: 1.0000 [drp] | Freq: Three times a day (TID) | OPHTHALMIC | Status: DC | PRN

## 2024-08-05 MED ORDER — PANTOPRAZOLE SODIUM 40 MG PO TBEC
40.0000 mg | DELAYED_RELEASE_TABLET | Freq: Every day | ORAL | Status: DC
  Administered 2024-08-05 – 2024-08-15 (×11): 40 mg via ORAL
  Filled 2024-08-05 (×11): qty 1

## 2024-08-05 MED ORDER — SODIUM CHLORIDE 0.9 % IV SOLN: INTRAVENOUS | Status: DC

## 2024-08-05 MED ORDER — ONDANSETRON HCL 4 MG/2ML IJ SOLN
4.0000 mg | Freq: Once | INTRAMUSCULAR | Status: AC
  Administered 2024-08-05: 4 mg via INTRAVENOUS
  Filled 2024-08-05: qty 2

## 2024-08-05 MED ORDER — ENOXAPARIN SODIUM 40 MG/0.4ML IJ SOSY
40.0000 mg | PREFILLED_SYRINGE | INTRAMUSCULAR | Status: DC
  Administered 2024-08-05 – 2024-08-14 (×10): 40 mg via SUBCUTANEOUS
  Filled 2024-08-05 (×10): qty 0.4

## 2024-08-05 MED ORDER — ATORVASTATIN CALCIUM 40 MG PO TABS
40.0000 mg | ORAL_TABLET | Freq: Every day | ORAL | Status: DC
  Administered 2024-08-05 – 2024-08-14 (×10): 40 mg via ORAL
  Filled 2024-08-05 (×10): qty 1

## 2024-08-05 MED ORDER — ONDANSETRON HCL 4 MG PO TABS: 4.0000 mg | ORAL_TABLET | Freq: Four times a day (QID) | ORAL | Status: DC | PRN

## 2024-08-05 MED ORDER — ALBUTEROL SULFATE (2.5 MG/3ML) 0.083% IN NEBU: 2.5000 mg | INHALATION_SOLUTION | RESPIRATORY_TRACT | Status: DC | PRN

## 2024-08-05 MED ORDER — PREDNISONE 20 MG PO TABS
40.0000 mg | ORAL_TABLET | Freq: Every day | ORAL | Status: DC
  Administered 2024-08-06 – 2024-08-15 (×10): 40 mg via ORAL
  Filled 2024-08-05 (×10): qty 2

## 2024-08-05 MED ORDER — FENTANYL CITRATE PF 50 MCG/ML IJ SOSY
50.0000 ug | PREFILLED_SYRINGE | Freq: Once | INTRAMUSCULAR | Status: AC
  Administered 2024-08-05: 50 ug via INTRAVENOUS
  Filled 2024-08-05: qty 1

## 2024-08-05 MED ORDER — SODIUM CHLORIDE 0.9 % IV BOLUS (SEPSIS)
250.0000 mL | Freq: Once | INTRAVENOUS | Status: AC
  Administered 2024-08-05: 250 mL via INTRAVENOUS

## 2024-08-05 MED ORDER — MELATONIN 5 MG PO TABS
5.0000 mg | ORAL_TABLET | Freq: Every evening | ORAL | Status: DC | PRN
  Administered 2024-08-10 – 2024-08-14 (×4): 5 mg via ORAL
  Filled 2024-08-05 (×4): qty 1

## 2024-08-05 MED ORDER — HYDROMORPHONE HCL 1 MG/ML IJ SOLN
0.5000 mg | INTRAMUSCULAR | Status: DC | PRN
  Administered 2024-08-05 – 2024-08-13 (×22): 1 mg via INTRAVENOUS
  Filled 2024-08-05 (×23): qty 1

## 2024-08-05 MED ORDER — FENTANYL CITRATE PF 50 MCG/ML IJ SOSY
100.0000 ug | PREFILLED_SYRINGE | Freq: Once | INTRAMUSCULAR | Status: AC
  Administered 2024-08-05: 100 ug via INTRAVENOUS
  Filled 2024-08-05: qty 2

## 2024-08-05 MED ORDER — SODIUM CHLORIDE 0.9 % IV BOLUS (SEPSIS)
1000.0000 mL | Freq: Once | INTRAVENOUS | Status: AC
Start: 2024-08-05 — End: 2024-08-05
  Administered 2024-08-05: 1000 mL via INTRAVENOUS

## 2024-08-05 MED ORDER — ACETAMINOPHEN 650 MG RE SUPP: 650.0000 mg | Freq: Four times a day (QID) | RECTAL | Status: DC | PRN

## 2024-08-05 MED ORDER — ONDANSETRON HCL 4 MG/2ML IJ SOLN
4.0000 mg | Freq: Four times a day (QID) | INTRAMUSCULAR | Status: DC | PRN
  Administered 2024-08-05 – 2024-08-10 (×3): 4 mg via INTRAVENOUS
  Filled 2024-08-05 (×4): qty 2

## 2024-08-05 MED ORDER — TRAZODONE HCL 50 MG PO TABS: 25.0000 mg | ORAL_TABLET | Freq: Every evening | ORAL | Status: DC | PRN

## 2024-08-05 MED ORDER — ACETAMINOPHEN 325 MG PO TABS: 650.0000 mg | ORAL_TABLET | Freq: Four times a day (QID) | ORAL | Status: DC | PRN

## 2024-08-05 MED ORDER — UPADACITINIB ER 45 MG PO TB24: 45.0000 mg | ORAL_TABLET | Freq: Every day | ORAL | Status: DC

## 2024-08-05 MED ORDER — GLUCERNA SHAKE PO LIQD
237.0000 mL | Freq: Three times a day (TID) | ORAL | Status: DC
  Administered 2024-08-05 – 2024-08-14 (×23): 237 mL via ORAL
  Filled 2024-08-05 (×34): qty 237

## 2024-08-05 NOTE — ED Provider Notes (Signed)
 [facility] AT [facility] Provider Note   CSN: [id number] Arrival date & time: 08/05/24  9191     Patient presents with: Abdominal Pain   Nancy Eaton is a 68 y.o. female.   The history is provided by the patient.  Patient history of ulcerative colitis, currently on prednisone  40 mg presents with nausea vomiting and sharp abdominal pains been ongoing intermittently for the past 3 days.  She reports this coincided with starting valacyclovir at the direction of her gastroenterologist. She denies any bloody emesis.  She had a small bowel movement earlier today that was nonbloody.  No diarrhea. No dysuria, she is maintaining urine output. Denies previous abdominal surgeries   Past Medical History:  Diagnosis Date   Arthritis    Knee   Hyperlipidemia    Lovastatin  started approx 2011   IFG (impaired fasting glucose) 2018   HbA1c 5.9% (stable)   PONV (postoperative nausea and vomiting)    Pre-diabetes    Right knee pain 12/2019   End stage: TKA recommended by ortho 01/2020->pt considering   Sigmoid diverticulosis 04/2010                    UC (ulcerative colitis) (HCC)     Prior to Admission medications   Medication Sig Start Date End Date Taking? Authorizing Provider  acetaminophen  (TYLENOL ) 500 MG tablet Take 500 mg by mouth every 6 (six) hours as needed for moderate pain (pain score 4-6).    [provider]  atorvastatin  (LIPITOR) 40 MG tablet Take 1 tablet (40 mg total) by mouth daily. 02/25/19   McGowen, Aleene DEL, MD  CALCIUM -VITAMIN D  PO Take 2 tablets by mouth daily. Patient not taking: Reported on 07/11/2024    [provider]  Cholecalciferol 1.25 MG (50000 UT) capsule Take 50,000 Units by mouth once a week. Patient not taking: Reported on 07/11/2024 06/04/24 09/02/24  [provider]  feeding supplement, GLUCERNA SHAKE, (GLUCERNA SHAKE) LIQD Take 237 mLs by mouth 3 (three) times daily between meals.  07/19/24   Sebastian Toribio GAILS, MD  FEROSUL 325 (65 Fe) MG tablet Take 325 mg by mouth every other day. 06/04/24   [provider]  Glucosamine HCl-MSM (GLUCOSAMINE-MSM PO) Take 1 tablet by mouth daily. Patient not taking: Reported on 07/11/2024    [provider]  losartan  (COZAAR ) 50 MG tablet Take 50 mg by mouth daily. 01/03/24   [provider]  Multiple Vitamins-Minerals (MULTIVITAMIN WITH MINERALS) tablet Take 1 tablet by mouth daily. Women 50+ Patient not taking: Reported on 07/11/2024    [provider]  Omega-3 Fatty Acids (FISH OIL PO) Take 1,400 mg by mouth daily. Omega3 980 mg Patient not taking: Reported on 07/11/2024    [provider]  ondansetron  (ZOFRAN -ODT) 4 MG disintegrating tablet Dissolve 1 tablet (4 mg total) by mouth every 8 (eight) hours as needed for nausea or vomiting. 07/19/24   Sebastian Toribio GAILS, MD  pantoprazole  (PROTONIX ) 40 MG tablet Take 1 tablet (40 mg total) by mouth daily. 07/20/24   Sebastian Toribio GAILS, MD  predniSONE  (DELTASONE ) 20 MG tablet Take 2 tablets (40 mg total) by mouth daily with breakfast. 07/19/24   Sebastian Toribio GAILS, MD  Upadacitinib  ER (RINVOQ ) 45 MG TB24 Take 1 tablet (45 mg total) by mouth daily. 07/20/24   Sebastian Toribio GAILS, MD    Allergies: Penicillins    Review of Systems  Constitutional:  Negative for fever.  Respiratory:  Negative for  shortness of breath.   Cardiovascular:  Negative for chest pain.  Gastrointestinal:  Positive for abdominal pain, nausea and vomiting. Negative for blood in stool.  Genitourinary:  Negative for dysuria.    Updated Vital Signs BP 134/70   Pulse 83   Temp 98 F (36.7 C) (Oral)   Resp 15   SpO2 98%   Physical Exam CONSTITUTIONAL: Well developed/well nourished HEAD: Normocephalic/atraumatic EYES: EOMI/PERRL, no icterus, conjunctival pink ENMT: Mucous membranes dry NECK: supple no meningeal signs CV: S1/S2 noted, no murmurs/rubs/gallops noted LUNGS: Lungs  are clear to auscultation bilaterally, no apparent distress ABDOMEN: soft, nontender, no rebound or guarding, bowel sounds noted throughout abdomen GU:no cva tenderness NEURO: Pt is awake/alert/appropriate, moves all extremitiesx4.  No facial droop.   EXTREMITIES: pulses normal/equal, full ROM SKIN: warm, color normal PSYCH: no abnormalities of mood noted, alert and oriented to situation  (all labs ordered are listed, but only abnormal results are displayed) Labs Reviewed  COMPREHENSIVE METABOLIC PANEL WITH GFR - Abnormal; Notable for the following components:      Result Value   Sodium 128 (*)    Chloride 93 (*)    Total Protein 5.7 (*)    Albumin 3.1 (*)    AST 13 (*)    All other components within normal limits  CBC WITH DIFFERENTIAL/PLATELET - Abnormal; Notable for the following components:   WBC 12.0 (*)    RBC 3.60 (*)    Hemoglobin 9.2 (*)    HCT 29.7 (*)    MCH 25.6 (*)    RDW 16.6 (*)    Platelets 534 (*)    Neutro Abs 8.0 (*)    Monocytes Absolute 1.8 (*)    Abs Immature Granulocytes 0.55 (*)    All other components within normal limits  LIPASE, BLOOD  URINALYSIS, ROUTINE W REFLEX MICROSCOPIC    EKG: EKG Interpretation Date/Time:  Monday August 05 2024 08:33:23 EDT Ventricular Rate:  78 PR Interval:  149 QRS Duration:  92 QT Interval:  343 QTC Calculation: 391 R Axis:   56  Text Interpretation: Sinus rhythm No previous ECGs available Confirmed by Midge Golas ([id number]) on 08/05/2024 8:35:18 AM  Radiology: No results found.   Procedures   Medications Ordered in the ED  sodium chloride  0.9 % bolus 250 mL (has no administration in time range)  fentaNYL  (SUBLIMAZE ) injection 50 mcg (50 mcg Intravenous Given 08/05/24 0921)  ondansetron  (ZOFRAN ) injection 4 mg (4 mg Intravenous Given 08/05/24 0921)  sodium chloride  0.9 % bolus 1,000 mL (0 mLs Intravenous Stopped 08/05/24 1050)  fentaNYL  (SUBLIMAZE ) injection 100 mcg (100 mcg Intravenous Given 08/05/24  1049)    Clinical Course as of 08/05/24 1321  Mon Aug 05, 2024  1003 Patient stable, reports mild improvement [DW]  1026 Hemoglobin(!): 9.2 Stable anemia [DW]  1026 WBC(!): 12.0 Leukocytosis [DW]  1103 Patient with known history of UC, recently started valacyclovir per her GI due to an abnormal rash Her abdominal exam is overall unremarkable.  Labs reveal dehydration [DW]  1103 Sodium(!): 128 Mild hyponatremia [DW]  1103 Discussed with Dr. Rollin, gastroenterology.  He reports he started the valacyclovir because of an unknown rash.  He knows the patient well and they are trying to start infliximab  for her ulcerative colitis.  She has had multiple imaging modalities previously, and given her overall clinical appearance we will defer CT imaging for now.  However if she is unable to tolerate p.o. she may benefit from admission and rehydration [DW]  1236 Patient  with persistent nausea and intermittent pain after drinking water.  Patient prefers to be admitted.  She is in no acute distress.  Will call for admission [DW]  1320 D/w dr roxane with [facility] for admission [DW]    Clinical Course User Index [DW] Midge Golas, MD                                 Medical Decision Making Amount and/or Complexity of Data Reviewed Labs: ordered. Decision-making details documented in ED Course.  Risk Prescription drug management. Decision regarding hospitalization.   This patient presents to the ED for concern of abdominal pain, this involves an extensive number of treatment options, and is a complaint that carries with it a high risk of complications and morbidity.  The differential diagnosis includes but is not limited to cholecystitis, cholelithiasis, pancreatitis, gastritis, peptic ulcer disease, appendicitis, bowel obstruction, bowel perforation, diverticulitis, AAA, ischemic bowel, acute coronary syndrome UC flare  Comorbidities that complicate the patient evaluation: Patient's presentation  is complicated by their history of ulcerative colitis  Social Determinants of Health: Patient's multiple ER visits  increases the complexity of managing their presentation  Additional history obtained: Records reviewed previous admission documents  Lab Tests: I Ordered, and personally interpreted labs.  The pertinent results include: hypoNatremia  Medicines ordered and prescription drug management: I ordered medication including fentanyl   for pain  Reevaluation of the patient after these medicines showed that the patient    improved  Test Considered: Considered CT abdomen pelvis, but patient has had multiple imaging modalities recent, will defer for now   Critical Interventions:   IV fluids  Consultations Obtained: I requested consultation with the admitting physician [facility], and discussed  findings as well as pertinent plan - they recommend: admit  Reevaluation: After the interventions noted above, I reevaluated the patient and found that they have :stayed the same  Complexity of problems addressed: Patient's presentation is most consistent with  acute presentation with potential threat to life or bodily function  Disposition: After consideration of the diagnostic results and the patient's response to treatment,  I feel that the patent would benefit from admission  .        Final diagnoses:  Generalized abdominal pain  Dehydration  Hyponatremia    ED Discharge Orders     None          Midge Golas, MD 08/05/24 1321

## 2024-08-05 NOTE — ED Triage Notes (Signed)
 Pt presents to ED from home C/O abdominal pain, n/v since Friday. Pt reports GI started her on Valcyclovir on Friday and she has been having these symptoms since then. Hx ulcerative colitis.

## 2024-08-05 NOTE — ED Notes (Signed)
 Unable to obtain labs at time of IV access. She may have to use a straight stick or a butterfly to get it. I will attempt shortly.

## 2024-08-05 NOTE — H&P (Signed)
 History and Physical  Vicie Cech FMW:[id number] DOB: 1956-09-09 DOA: 08/05/2024  PCP: Christopher Arland PARAS, PA-C   Chief Complaint: Cramping, vomiting  HPI: Nancy Eaton is a 68 y.o. female with medical history significant for prediabetes, ulcerative colitis, hypertension, hyperlipidemia recently discharged from [facility] for ulcerative colitis flare on 8/29 now returns with dehydration, abdominal cramping and vomiting after starting acyclovir .  During her most recent hospitalization, she was treated with IV steroids, found to have subtherapeutic infliximab  levels and was started on Rinvoq .  Patient told me that this past week, her gastroenterologist Dr. Rollin had started her on Humira, and she developed a rash on her bottom.  She states that this was nonpainful.  Per the patient, Dr. Rollin was concerned that this may be herpes zoster, he started her on acyclovir  and as soon as she started taking that medication on Friday 9/12, she started having very severe abdominal cramping, associated nausea and vomiting.  She has essentially been unable to keep down food or most fluids since that day.  She denies any fevers, chest pain, she is not having worsening diarrhea, or any blood in her stools.  Review of Systems: Please see HPI for pertinent positives and negatives. A complete 10 system review of systems are otherwise negative.  Past Medical History:  Diagnosis Date   Arthritis    Knee   Hyperlipidemia    Lovastatin  started approx 2011   IFG (impaired fasting glucose) 2018   HbA1c 5.9% (stable)   PONV (postoperative nausea and vomiting)    Pre-diabetes    Right knee pain 12/2019   End stage: TKA recommended by ortho 01/2020->pt considering   Sigmoid diverticulosis 04/2010                    UC (ulcerative colitis) [facility])    Past Surgical History:  Procedure Laterality Date   BREAST ENHANCEMENT SURGERY     As of mammogram 12/2017--rupture of implants stable.   CARPAL TUNNEL RELEASE      CESAREAN SECTION     COLONOSCOPY  age 20   [facility]--recall 10 yrs   FLEXIBLE SIGMOIDOSCOPY N/A 02/02/2024   Procedure: KINGSTON SIDE;  Surgeon: Rollin Dover, MD;  Location: [facility];  Service: Gastroenterology;  Laterality: N/A;   TOTAL KNEE ARTHROPLASTY Right 07/17/2020   Procedure: RIGHT TOTAL KNEE ARTHROPLASTY;  Surgeon: Vernetta Lonni GRADE, MD;  Location: [facility];  Service: Orthopedics;  Laterality: Right;   Social History:  reports that she has never smoked. She has never used smokeless tobacco. She reports current alcohol  use. She reports that she does not use drugs.  Allergies  Allergen Reactions   Penicillins Rash    20 years   Did it involve swelling of the face/tongue/throat, SOB, or low BP? No Did it involve sudden or severe rash/hives, skin peeling, or any reaction on the inside of your mouth or nose? Yes Did you need to seek medical attention at a hospital or doctor's office? No When did it last happen? Over 20 Years       If all above answers are NO, may proceed with cephalosporin use.      Family History  Problem Relation Age of Onset   Cancer Mother        Brain   Alcohol  abuse Father    COPD Brother    ADD / ADHD Son    Anxiety disorder Son    Heart disease Brother      Prior to Admission medications  Medication Sig Start Date End Date Taking? Authorizing Provider  acetaminophen  (TYLENOL ) 500 MG tablet Take 500 mg by mouth every 6 (six) hours as needed for moderate pain (pain score 4-6).    [provider]  atorvastatin  (LIPITOR) 40 MG tablet Take 1 tablet (40 mg total) by mouth daily. 02/25/19   McGowen, Aleene DEL, MD  CALCIUM -VITAMIN D  PO Take 2 tablets by mouth daily. Patient not taking: Reported on 07/11/2024    [provider]  Cholecalciferol 1.25 MG (50000 UT) capsule Take 50,000 Units by mouth once a week. Patient not taking: Reported on 07/11/2024 06/04/24 09/02/24  [provider]  feeding supplement,  GLUCERNA SHAKE, (GLUCERNA SHAKE) LIQD Take 237 mLs by mouth 3 (three) times daily between meals. 07/19/24   Sebastian Toribio GAILS, MD  FEROSUL 325 (65 Fe) MG tablet Take 325 mg by mouth every other day. 06/04/24   [provider]  Glucosamine HCl-MSM (GLUCOSAMINE-MSM PO) Take 1 tablet by mouth daily. Patient not taking: Reported on 07/11/2024    [provider]  losartan  (COZAAR ) 50 MG tablet Take 50 mg by mouth daily. 01/03/24   [provider]  Multiple Vitamins-Minerals (MULTIVITAMIN WITH MINERALS) tablet Take 1 tablet by mouth daily. Women 50+ Patient not taking: Reported on 07/11/2024    [provider]  Omega-3 Fatty Acids (FISH OIL PO) Take 1,400 mg by mouth daily. Omega3 980 mg Patient not taking: Reported on 07/11/2024    [provider]  ondansetron  (ZOFRAN -ODT) 4 MG disintegrating tablet Dissolve 1 tablet (4 mg total) by mouth every 8 (eight) hours as needed for nausea or vomiting. 07/19/24   Sebastian Toribio GAILS, MD  pantoprazole  (PROTONIX ) 40 MG tablet Take 1 tablet (40 mg total) by mouth daily. 07/20/24   Sebastian Toribio GAILS, MD  predniSONE  (DELTASONE ) 20 MG tablet Take 2 tablets (40 mg total) by mouth daily with breakfast. 07/19/24   Sebastian Toribio GAILS, MD  Upadacitinib  ER (RINVOQ ) 45 MG TB24 Take 1 tablet (45 mg total) by mouth daily. 07/20/24   Sebastian Toribio GAILS, MD    Physical Exam: BP 134/70   Pulse 83   Temp 98 F (36.7 C) (Oral)   Resp 15   SpO2 98%  General:  Alert, oriented, calm, in no acute distress, her family members at the bedside, overall she looks nontoxic but dehydrated Cardiovascular: RRR, no murmurs or rubs, no peripheral edema  Respiratory: clear to auscultation bilaterally, no wheezes, no crackles  Abdomen: soft, nontender, nondistended, normal bowel tones heard  Skin: dry, no rashes  Musculoskeletal: no joint effusions, normal range of motion  Psychiatric: appropriate affect, normal speech  Neurologic: extraocular muscles  intact, clear speech, moving all extremities with intact sensorium         Labs on Admission:  Basic Metabolic Panel: Recent Labs  Lab 08/05/24 0956  NA 128*  K 4.1  CL 93*  CO2 26  GLUCOSE 93  BUN 15  CREATININE 0.65  CALCIUM  9.0   Liver Function Tests: Recent Labs  Lab 08/05/24 0956  AST 13*  ALT 14  ALKPHOS 73  BILITOT 0.3  PROT 5.7*  ALBUMIN 3.1*   Recent Labs  Lab 08/05/24 0956  LIPASE 19   No results for input(s): AMMONIA in the last 168 hours. CBC: Recent Labs  Lab 08/05/24 0956  WBC 12.0*  NEUTROABS 8.0*  HGB 9.2*  HCT 29.7*  MCV 82.5  PLT 534*   Cardiac Enzymes: No results for input(s): CKTOTAL, CKMB, CKMBINDEX, TROPONINI in  the last 168 hours. BNP (last 3 results) No results for input(s): BNP in the last 8760 hours.  ProBNP (last 3 results) No results for input(s): PROBNP in the last 8760 hours.  CBG: No results for input(s): GLUCAP in the last 168 hours.  Radiological Exams on Admission: No results found.  Assessment/Plan Jacquel Redditt is a 68 y.o. female with medical history significant for prediabetes, active ulcerative colitis, hypertension, hyperlipidemia recently discharged from [facility] for ulcerative colitis flare on 8/29 now returns with dehydration, abdominal cramping and vomiting after starting acyclovir .   Nausea and vomiting-without significant abdominal pain, diarrhea, or blood in the stool.  Coincides with starting acyclovir , so may be a reaction to this.  Mild leukocytosis but patient is on steroids, no focal abdominal tenderness, no evidence of acute infection. -Inpatient admission -Clear liquid diet -Supportive care, discontinue acyclovir   Hyponatremia-given the patient's recent vomiting, and evidence of dehydration on exam, this is likely a hypovolemic hyponatremia. -Hydrate with normal saline -Recheck sodium this afternoon  Ulcerative colitis-admitted to the hospital with recent flare, improved with IV  steroids and initiation of Rinvoq .  Currently I do not suspect ulcerative colitis flare.  ER provider discussed with GI Dr. Rollin at the time of admission, who did not feel that imaging was necessary at this point in time. -Continue prednisone  40 mg p.o. daily -Continue Rinvoq  -Secure chat message sent to Dr. Kristie, requesting inpatient consult  Essential hypertension-Cozaar  was paused during last admission, blood pressure is currently normal so we will continue to hold this for the time being  Type 2 diabetes-most recent hemoglobin A1c 6.7, diet controlled -Moderate dose sliding scale  Chronic iron  deficiency anemia-hemoglobin stable at 9.2  Chronic thrombocytosis-stable  GERD-continue PPI  DVT prophylaxis: Lovenox      Code Status: Full Code  Consults called: Dr. Kristie GI  Admission status: The appropriate patient status for this patient is INPATIENT. Inpatient status is judged to be reasonable and necessary in order to provide the required intensity of service to ensure the patient's safety. The patient's presenting symptoms, physical exam findings, and initial radiographic and laboratory data in the context of their chronic comorbidities is felt to place them at high risk for further clinical deterioration. Furthermore, it is not anticipated that the patient will be medically stable for discharge from the hospital within 2 midnights of admission.    I certify that at the point of admission it is my clinical judgment that the patient will require inpatient hospital care spanning beyond 2 midnights from the point of admission due to high intensity of service, high risk for further deterioration and high frequency of surveillance required  Time spent: 56 minutes  Micayla Brathwaite CHRISTELLA Gail MD [facility] Pager [phone]  If 7PM-7AM, please contact night-coverage [email or web address] Password [email or web address]  08/05/2024, 1:48 PM

## 2024-08-05 NOTE — Consult Note (Signed)
 Reason for Consult: Abdominal pain with nausea and vomiting Referring Physician: [facility] hospitalist  Carlye Panameno Camberos is an 68 y.o. female.  HPI: Elzie Sheets is a 68 year old white female with multiple medical problems listed below who presented to the emergency room with recurrent abdominal pain nausea and vomiting.  She claims she is no longer having rectal bleeding but as she recently had a rash on her buttocks she was thought to have shingles and was prescribed valacyclovir.  She had severe reaction to the medication and developed severe abdominal pain nausea and vomiting. She denies any hematochezia or melena at this time.  She is getting good appetite and weights been stable. The severe abdominal pain prompted her to come to the emergency room.  Seems to be doing better after getting some narcotics. She was recently started on Humira but she feels that Humira did not help her symptoms at all.  Past Medical History:  Diagnosis Date   Arthritis    Knee   Hyperlipidemia    Lovastatin  started approx 2011   IFG (impaired fasting glucose) 2018   HbA1c 5.9% (stable)   PONV (postoperative nausea and vomiting)    Pre-diabetes    Right knee pain 12/2019   End stage: TKA recommended by ortho 01/2020->pt considering   Sigmoid diverticulosis 04/2010                    UC (ulcerative colitis) [facility])    Past Surgical History:  Procedure Laterality Date   BREAST ENHANCEMENT SURGERY     As of mammogram 12/2017--rupture of implants stable.   CARPAL TUNNEL RELEASE     CESAREAN SECTION     COLONOSCOPY  age 69   [facility]--recall 10 yrs   FLEXIBLE SIGMOIDOSCOPY N/A 02/02/2024   Procedure: KINGSTON SIDE;  Surgeon: Rollin Dover, MD;  Location: [facility];  Service: Gastroenterology;  Laterality: N/A;   TOTAL KNEE ARTHROPLASTY Right 07/17/2020   Procedure: RIGHT TOTAL KNEE ARTHROPLASTY;  Surgeon: Vernetta Lonni GRADE, MD;  Location: [facility];  Service: Orthopedics;  Laterality: Right;    Family History  Problem Relation Age of Onset   Cancer Mother        Brain   Alcohol  abuse Father    COPD Brother    ADD / ADHD Son    Anxiety disorder Son    Heart disease Brother    Social History:  reports that she has never smoked. She has never used smokeless tobacco. She reports current alcohol  use. She reports that she does not use drugs.  Allergies:  Allergies  Allergen Reactions   Penicillins Rash    20 years   Did it involve swelling of the face/tongue/throat, SOB, or low BP? No Did it involve sudden or severe rash/hives, skin peeling, or any reaction on the inside of your mouth or nose? Yes Did you need to seek medical attention at a hospital or doctor's office? No When did it last happen? Over 20 Years       If all above answers are NO, may proceed with cephalosporin use.     Medications: I have reviewed the patient's current medications. Prior to Admission:  Medications Prior to Admission  Medication Sig Dispense Refill Last Dose/Taking   acetaminophen  (TYLENOL ) 500 MG tablet Take 1,000 mg by mouth every 6 (six) hours as needed (for pain).   Past Week   Ascorbic Acid  (VITAMIN C ) 1000 MG tablet Take 1,000 mg by mouth every other day.   Past Week  atorvastatin  (LIPITOR) 40 MG tablet Take 1 tablet (40 mg total) by mouth daily. (Patient taking differently: Take 40 mg by mouth at bedtime.) 90 tablet 0 08/04/2024 Bedtime   feeding supplement, GLUCERNA SHAKE, (GLUCERNA SHAKE) LIQD Take 237 mLs by mouth 3 (three) times daily between meals.   08/04/2024 Evening   ferrous sulfate  325 (65 FE) MG tablet Take 325 mg by mouth See admin instructions. Take 325 mg by mouth every other morning with breakfast   Past Week   INFLECTRA  100 MG SOLR Inject 100 mg into the vein every 2 (two) months.   05/27/2024   [Paused] losartan  (COZAAR ) 50 MG tablet Take 25 mg by mouth in the morning.   08/04/2024 Morning   melatonin 5 MG TABS Take 5 mg by mouth at bedtime as needed (for sleep).    Unknown   Multiple Vitamins-Minerals (ONE-A-DAY WOMENS 50+ PO) Take 1 tablet by mouth daily with breakfast.   08/04/2024 Morning   Omega-3 Fatty Acids (FISH OIL PO) Take 1,400 mg by mouth daily. Omega3 980 mg   Past Week   ondansetron  (ZOFRAN -ODT) 4 MG disintegrating tablet Dissolve 1 tablet (4 mg total) by mouth every 8 (eight) hours as needed for nausea or vomiting. (Patient taking differently: Take 4 mg by mouth every 8 (eight) hours as needed for nausea or vomiting (dissolve orally).) 20 tablet 0 Unknown   pantoprazole  (PROTONIX ) 40 MG tablet Take 1 tablet (40 mg total) by mouth daily. (Patient taking differently: Take 40 mg by mouth daily before breakfast.) 30 tablet 1 08/04/2024 Morning   predniSONE  (DELTASONE ) 20 MG tablet Take 2 tablets (40 mg total) by mouth daily with breakfast. 60 tablet 0 08/04/2024 Morning   sodium chloride  0.9 % SOLN See admin instructions. As directed- in conjunction with Inflectra  every 2 months   05/27/2024   SYSTANE COMPLETE 0.6 % SOLN Place 1 drop into both eyes 3 (three) times daily as needed (for dryness).   Past Week   [DISCONTINUED] Ascorbic Acid  (C EXTRA STRENGTH) 1000 MG TABS Take 1 tablet by mouth every other day. (Patient not taking: Reported on 07/11/2024)      [DISCONTINUED] oxyCODONE  (ROXICODONE ) 5 MG immediate release tablet Take 1 tablet (5 mg total) by mouth every 4 (four) hours as needed for severe pain (pain score 7-10). 20 tablet 0    Scheduled:  atorvastatin   40 mg Oral QHS   enoxaparin  (LOVENOX ) injection  40 mg Subcutaneous Q24H   insulin  aspart  0-15 Units Subcutaneous Q4H   pantoprazole   40 mg Oral Daily   [START ON 08/06/2024] predniSONE   40 mg Oral Q breakfast   Continuous:  sodium chloride  100 mL/hr at 08/05/24 1506   PRN:acetaminophen  **OR** acetaminophen , albuterol , HYDROmorphone  (DILAUDID ) injection, ondansetron  **OR** ondansetron  (ZOFRAN ) IV, traZODone   Results for orders placed or performed during the hospital encounter of 08/05/24  (from the past 48 hours)  Comprehensive metabolic panel     Status: Abnormal   Collection Time: 08/05/24  9:56 AM  Result Value Ref Range   Sodium 128 (L) 135 - 145 mmol/L   Potassium 4.1 3.5 - 5.1 mmol/L   Chloride 93 (L) 98 - 111 mmol/L   CO2 26 22 - 32 mmol/L   Glucose, Bld 93 70 - 99 mg/dL    Comment: Glucose reference range applies only to samples taken after fasting for at least 8 hours.   BUN 15 8 - 23 mg/dL   Creatinine, Ser 9.34 0.44 - 1.00 mg/dL   Calcium  9.0 8.9 -  10.3 mg/dL   Total Protein 5.7 (L) 6.5 - 8.1 g/dL   Albumin 3.1 (L) 3.5 - 5.0 g/dL   AST 13 (L) 15 - 41 U/L   ALT 14 0 - 44 U/L   Alkaline Phosphatase 73 38 - 126 U/L   Total Bilirubin 0.3 0.0 - 1.2 mg/dL   GFR, Estimated >39 >39 mL/min    Comment: (NOTE) Calculated using the CKD-EPI Creatinine Equation (2021)    Anion gap 9 5 - 15    Comment: Performed at [facility], [address], [place], [place] [address]  Lipase, blood     Status: None   Collection Time: 08/05/24  9:56 AM  Result Value Ref Range   Lipase 19 11 - 51 U/L    Comment: Performed at [facility], [address], [place], [place] [address]  CBC with Diff     Status: Abnormal   Collection Time: 08/05/24  9:56 AM  Result Value Ref Range   WBC 12.0 (H) 4.0 - 10.5 K/uL   RBC 3.60 (L) 3.87 - 5.11 MIL/uL   Hemoglobin 9.2 (L) 12.0 - 15.0 g/dL   HCT 70.2 (L) 63.9 - 53.9 %   MCV 82.5 80.0 - 100.0 fL   MCH 25.6 (L) 26.0 - 34.0 pg   MCHC 31.0 30.0 - 36.0 g/dL   RDW 83.3 (H) 88.4 - 84.4 %   Platelets 534 (H) 150 - 400 K/uL   nRBC 0.0 0.0 - 0.2 %   Neutrophils Relative % 66 %   Neutro Abs 8.0 (H) 1.7 - 7.7 K/uL   Lymphocytes Relative 13 %   Lymphs Abs 1.6 0.7 - 4.0 K/uL   Monocytes Relative 15 %   Monocytes Absolute 1.8 (H) 0.1 - 1.0 K/uL   Eosinophils Relative 0 %   Eosinophils Absolute 0.0 0.0 - 0.5 K/uL   Basophils Relative 1 %   Basophils Absolute 0.1 0.0 - 0.1 K/uL   RBC Morphology MORPHOLOGY  UNREMARKABLE    Smear Review Normal platelet morphology    Immature Granulocytes 5 %   Abs Immature Granulocytes 0.55 (H) 0.00 - 0.07 K/uL   Reactive, Benign Lymphocytes PRESENT     Comment: Performed at [facility], [address] [place], [place] [address]  Urinalysis, Routine w reflex microscopic -Urine, Clean Catch     Status: None   Collection Time: 08/05/24 11:24 AM  Result Value Ref Range   Color, Urine YELLOW YELLOW   APPearance CLEAR CLEAR   Specific Gravity, Urine 1.006 1.005 - 1.030   pH 7.0 5.0 - 8.0   Glucose, UA NEGATIVE NEGATIVE mg/dL   Hgb urine dipstick NEGATIVE NEGATIVE   Bilirubin Urine NEGATIVE NEGATIVE   Ketones, ur NEGATIVE NEGATIVE mg/dL   Protein, ur NEGATIVE NEGATIVE mg/dL   Nitrite NEGATIVE NEGATIVE   Leukocytes,Ua NEGATIVE NEGATIVE    Comment: Performed at [facility], [address] [place], [place] [address]   Review of Systems  Constitutional:  Positive for activity change, appetite change and fatigue. Negative for chills, diaphoresis, fever and unexpected weight change.  HENT: Negative.    Eyes:  Positive for pain, discharge and itching. Negative for photophobia, redness and visual disturbance.  Cardiovascular: Negative.   Gastrointestinal:  Positive for abdominal pain. Negative for abdominal distention, anal bleeding, blood in stool and constipation.  Endocrine: Negative.   Genitourinary: Negative.   Musculoskeletal:  Positive for arthralgias.  Allergic/Immunologic: Negative.   Neurological: Negative.   Hematological:  Negative.   Psychiatric/Behavioral: Negative.     Blood pressure 134/70, pulse 83, temperature 98 F (36.7 C), temperature source Oral, resp. rate 15, SpO2 98%. Physical Exam Constitutional:      General: She is not in acute distress.    Appearance: She is well-developed. She is not ill-appearing, toxic-appearing or diaphoretic.  HENT:     Head: Normocephalic and atraumatic.      Mouth/Throat:     Mouth: Mucous membranes are moist.     Pharynx: Oropharynx is clear.  Eyes:     Extraocular Movements: Extraocular movements intact.     Pupils: Pupils are equal, round, and reactive to light.  Cardiovascular:     Rate and Rhythm: Normal rate and regular rhythm.     Heart sounds: Normal heart sounds.  Abdominal:     General: Bowel sounds are normal. There is no distension or abdominal bruit.     Palpations: Abdomen is soft.     Tenderness: There is no abdominal tenderness.  Genitourinary:    Rectum: Normal.  Neurological:     Mental Status: She is alert.   Assessment/Plan: 1) Ulcerative colitis with worsening abdominal pain nausea and vomiting.  Agree with IV hydration and steroids for now.  Patient might benefit from a change in her biologic during this hospitalization so maybe if she can get Infliximab  before she goes home. 2) Chronic iron  deficiency secondary to ulcerative colitis. 3) Chronic thrombocytosis secondary to UC. 4) GERD on PPI. 5) Morbid obesity.SABRA [name] 08/05/2024, 2:03 PM

## 2024-08-05 NOTE — Plan of Care (Signed)
  Problem: Clinical Measurements: Goal: Diagnostic test results will improve Outcome: Progressing Goal: Respiratory complications will improve Outcome: Progressing   Problem: Nutrition: Goal: Adequate nutrition will be maintained Outcome: Progressing   Problem: Coping: Goal: Level of anxiety will decrease Outcome: Progressing   

## 2024-08-05 NOTE — ED Notes (Signed)
 Pt aware a urine sample is needed, however she has been vomiting and is most likely slightly dehydrated, so I told her I would check back when half of her IVF are finished to see if she is ready to go at that time. Call bell is w/in reach, and she was told to use it to call out when she is able to provide a sample.

## 2024-08-05 NOTE — ED Notes (Signed)
 Pt stated that she is still unable to provide urine sample at this time.

## 2024-08-06 DIAGNOSIS — R1110 Vomiting, unspecified: Secondary | ICD-10-CM | POA: Diagnosis not present

## 2024-08-06 LAB — BASIC METABOLIC PANEL WITH GFR
Anion gap: 12 (ref 5–15)
BUN: 14 mg/dL (ref 8–23)
CO2: 21 mmol/L — ABNORMAL LOW (ref 22–32)
Calcium: 8.4 mg/dL — ABNORMAL LOW (ref 8.9–10.3)
Chloride: 95 mmol/L — ABNORMAL LOW (ref 98–111)
Creatinine, Ser: 0.53 mg/dL (ref 0.44–1.00)
GFR, Estimated: >60 mL/min (ref >60)
Glucose, Bld: 89 mg/dL (ref 70–99)
Potassium: 4 mmol/L (ref 3.5–5.1)
Sodium: 128 mmol/L — ABNORMAL LOW (ref 135–145)

## 2024-08-06 LAB — CBC
HCT: 30 % — ABNORMAL LOW (ref 36.0–46.0)
Hemoglobin: 8.8 g/dL — ABNORMAL LOW (ref 12.0–15.0)
MCH: 24.9 pg — ABNORMAL LOW (ref 26.0–34.0)
MCHC: 29.3 g/dL — ABNORMAL LOW (ref 30.0–36.0)
MCV: 85 fL (ref 80.0–100.0)
Platelets: 535 K/uL — ABNORMAL HIGH (ref 150–400)
RBC: 3.53 MIL/uL — ABNORMAL LOW (ref 3.87–5.11)
RDW: 17.1 % — ABNORMAL HIGH (ref 11.5–15.5)
WBC: 12.7 K/uL — ABNORMAL HIGH (ref 4.0–10.5)
nRBC: 0.2 % (ref 0.0–0.2)

## 2024-08-06 LAB — GLUCOSE, CAPILLARY
Glucose-Capillary: 124 mg/dL — ABNORMAL HIGH (ref 70–99)
Glucose-Capillary: 165 mg/dL — ABNORMAL HIGH (ref 70–99)
Glucose-Capillary: 187 mg/dL — ABNORMAL HIGH (ref 70–99)
Glucose-Capillary: 85 mg/dL (ref 70–99)
Glucose-Capillary: 91 mg/dL (ref 70–99)
Glucose-Capillary: 92 mg/dL (ref 70–99)

## 2024-08-06 MED ORDER — SODIUM CHLORIDE 0.9 % IV SOLN
10.0000 mg/kg | Freq: Once | INTRAVENOUS | Status: AC
Start: 2024-08-06 — End: 2024-08-06
  Administered 2024-08-06: 800 mg via INTRAVENOUS
  Filled 2024-08-06: qty 80

## 2024-08-06 MED ORDER — INFLUENZA VAC SPLIT HIGH-DOSE 0.5 ML IM SUSY
0.5000 mL | PREFILLED_SYRINGE | INTRAMUSCULAR | Status: AC
  Administered 2024-08-07: 0.5 mL via INTRAMUSCULAR
  Filled 2024-08-06: qty 0.5

## 2024-08-06 NOTE — Plan of Care (Signed)

## 2024-08-06 NOTE — Hospital Course (Addendum)
 Nancy Eaton is a 68 y.o. female with medical history significant for prediabetes, ulcerative colitis, hypertension, hyperlipidemia recently discharged from [facility] for ulcerative colitis flare on 8/29 now returns with dehydration, abdominal cramping and vomiting.  Found to have another ulcerative colitis flare and initially there was concern that her symptoms were related to acyclovir  but she has active herpes zoster so she will be placed back on IV acyclovir  as it is more of an intolerance than allergy.  This hospitalization she received a dose of Inflectra  and Rinvoq  has been discontinued and her Steroids have been changed from IV to po.  She is tolerating soft diet but ID is recommending continue IV acyclovir  for now monitoring her creatinine while we await VZV and HSV PCR.   Assessment and Plan:  Nausea and vomiting in the setting of ulcerative colitis: Improving. Now having abdominal discomfort, loose stools.  Initially thought to be in setting of valacyclovir  but she continues to have a mild leukocytosis but patient is on steroids, no focal abdominal tenderness, no evidence of acute infection. WBC Trend:  Recent Labs  Lab 07/19/24 0533 08/05/24 0956 08/06/24 0413 08/07/24 1052 08/08/24 0415 08/09/24 0345 08/10/24 0329  WBC 10.9* 12.0* 12.7* 14.3* 11.6* 10.5 15.1*  -Was on Soft Diet but having Sx so will cut down to FULL and her continued abdominal pain and loose stools -C/w Supportive care, Resumed IV Acyclovir  as below and ID recommends continuing pending the varicella zoster and HSV PCR pending; Will D/C patient home when clear from ID perspective.    Hyponatremia: Given the patient's recent vomiting, and evidence of dehydration on exam, this is likely a hypovolemic hyponatremia. -Hydrating w/ with normal saline at 100 mL/hr continuously; -Na+ Trend:  Recent Labs  Lab 08/05/24 0956 08/05/24 1649 08/06/24 0413 08/07/24 1052 08/08/24 0415 08/09/24 0345 08/10/24 0329  NA 128*  128* 128* 130* 136 136 135  -CTM and Trend and repeat CMP in the AM   Hypophosphatemia: Mild. Phos Level is now 2.1. Replete w/ po K Phos  Neutral 500 mg x1. CTM and Replete as Necessary. Repeat Phos Level in the AM  Buttock Rash in the setting of Active Herpes Zoster: Consult WOC nurse; Rash is Vesicular  ID consulted and Transferred her to Negative Pressure Room w/ Airborne and Contact Precautions. Getting IV Acyclovir  now and will be continued. ID checking Varicella Zoster via PCR and including HSV PCR and this is still pending; ID is tentatively planning to switch to p.o. valacyclovir  for discharge   Ulcerative colitis-admitted to the hospital with recent flare, improved with IV steroids and initiation of Rinvoq .  GI Dr. Rollin at the time of admission, who did not feel that imaging was necessary at this point in time. -Steroids change from IV to po and will Continue Prednisone  40 mg p.o. daily -Rinvoq  changed to Infliximab  by GI as she receives this 10 mg/kg infusion at 10 AM on 9/16 with improvement in her symptoms.  She was approved for 10 mg/kg infliximab  as an outpatient plan is to place her on this medication every 4 weeks per GI and they are recommending a repeat trough and antibody level to be obtained.  She is also amenable to start AZA at this time and will be initiated on this in the outpatient setting -Abdominal discomfort and loose stools improving and now tolerating SOFT Diet without issues    Essential Hypertension: Cozaar  was paused during last admission, blood pressure is currently normal so we will continue to hold this for  the time being. CTM BP per Protocol. Last BP reading was 147/73   Type 2 Diabetes Mellitus: Most recent hemoglobin A1c 6.7, diet controlled. Moderate dose sliding scale q4h. CTM CBGs per Protocol. CBGs ranging from 91-250 the last 7 checks:    Chronic Iron  Deficiency Anemia: Hgb/Hct Trend was dropping slightly but now fairly stable:  Recent Labs  Lab  07/19/24 0533 08/05/24 0956 08/06/24 0413 08/07/24 1052 08/08/24 0415 08/09/24 0345 08/10/24 0329  HGB 9.2* 9.2* 8.8* 8.7* 8.7* 7.7* 7.6*  HCT 30.6* 29.7* 30.0* 28.9* 27.7* 25.2* 24.4*  MCV 83.2 82.5 85.0 83.0 83.4 83.2 83.8  -Check Anemia Panel in the AM; CTM Monitor for S/Sx of Bleeding; No overt bleeding noted. Repeat CBC in the AM   Chronic Thrombocytosis: Stable. Plt Count remains elevated but trending down and went from 534 -> 432. CTM and Trend and repeat CMP in the AM   GERD/GI Prophylaxis: C/w Pantoprazole  40 mg po Daily   Hypoalbuminemia: Patient's Albumin Lvl ranging from 2.3 -> 3.1 (2.7 today). CTM and Trend and repeat CMP in the AM  Overweight: Complicates overall prognosis and care. Estimated body mass index is 29.8 kg/m as calculated from the following:   Height as of this encounter: 5' 2.5 (1.588 m).   Weight as of this encounter: 75.1 kg. Weight Loss and Dietary Counseling given

## 2024-08-06 NOTE — Plan of Care (Signed)
   Problem: Clinical Measurements: Goal: Ability to maintain clinical measurements within normal limits will improve Outcome: Progressing Goal: Will remain free from infection Outcome: Progressing Goal: Diagnostic test results will improve Outcome: Progressing Goal: Respiratory complications will improve Outcome: Progressing

## 2024-08-06 NOTE — TOC Initial Note (Signed)
 Transition of Care [facility]) - Initial/Assessment Note    Patient Details  Name: Nancy Eaton MRN: [id number] Date of Birth: 1956/01/21  [facility] CM/SW Contact:    Doneta Glenys DASEN, RN Phone Number: 08/06/2024, 3:39 PM  Clinical Narrative:                 Presented for abdominal pain. CM introduced and explained role. Patient states PTA lives in a house with adult son Penne [phone];PCP/insurance verified;DME-[facility]; denies HH,oxygen or SDOH needs;Family will transport at discharge. No IP CM needs identified during visit. Place consult if needs present.  Expected Discharge Plan: Home/Self Care Barriers to Discharge: No Barriers Identified   Patient Goals and CMS Choice Patient states their goals for this hospitalization and ongoing recovery are:: Home CMS Medicare.gov Compare Post Acute Care list provided to::  (NA) Choice offered to / list presented to : NA [place] ownership interest in [facility]provided to:: Parent NA    Expected Discharge Plan and Services In-house Referral: NA Discharge Planning Services: CM Consult   Living arrangements for the past 2 months: Single Family Home                 DME Arranged: N/A DME Agency: NA       HH Arranged: NA HH Agency: NA        Prior Living Arrangements/Services Living arrangements for the past 2 months: Single Family Home Lives with:: Adult Children Patient language and need for interpreter reviewed:: Yes Do you feel safe going back to the place where you live?: Yes      Need for Family Participation in Patient Care: No (Comment) Care giver support system in place?: Yes (comment) Current home services: DME ([facility]) Criminal Activity/Legal Involvement Pertinent to Current Situation/Hospitalization: No - Comment as needed  Activities of Daily Living   ADL Screening (condition at time of admission) Independently performs ADLs?: Yes (appropriate for developmental age) Is the patient  deaf or have difficulty hearing?: No Does the patient have difficulty seeing, even when wearing glasses/contacts?: No Does the patient have difficulty concentrating, remembering, or making decisions?: No  Permission Sought/Granted Permission sought to share information with : Case Manager Permission granted to share information with : Yes, Verbal Permission Granted  Share Information with NAME: Sawyer,Carolyn Suanne)  [phone]           Emotional Assessment Appearance:: Appears stated age Attitude/Demeanor/Rapport: Engaged Affect (typically observed): Appropriate Orientation: : Oriented to Self, Oriented to Place, Oriented to  Time, Oriented to Situation Alcohol  / Substance Use: Not Applicable Psych Involvement: No (comment)  Admission diagnosis:  Dehydration [E86.0] Hyponatremia [E87.1] Generalized abdominal pain [R10.84] Intractable vomiting [R11.10] Patient Active Problem List   Diagnosis Date Noted   Intractable vomiting 08/05/2024   Ulcerative colitis, acute, with rectal bleeding (HCC) 07/18/2024   Controlled type 2 diabetes mellitus without complication, without long-term current use of insulin  (HCC) 07/17/2024   Vitamin D  deficiency 07/17/2024   Gastroesophageal reflux disease 07/17/2024   Colitis 07/11/2024   Generalized abdominal pain 07/11/2024   Leukocytosis 07/11/2024   AKI (acute kidney injury) (HCC) 07/11/2024   Prediabetes 07/11/2024   History of essential hypertension 07/11/2024   Chronic iron  deficiency anemia 07/11/2024   Ulcerative colitis (HCC) 02/15/2024   Ulcerative colitis, left sided, with rectal bleeding (HCC) 02/14/2024   Hepatic lesion 02/14/2024   Hypokalemia 02/14/2024   Constipation 02/01/2024   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 [place] [address] Phone: [phone] Fax: [phone]     Social Drivers of Health (SDOH) Social History: SDOH Screenings   Food Insecurity: No Food Insecurity (08/05/2024)  Housing: Low Risk  (08/05/2024)  Transportation Needs: No Transportation Needs (08/05/2024)  Utilities: Not At Risk (08/05/2024)  Social Connections: Unknown (08/05/2024)  Tobacco Use: Low Risk  (08/05/2024)   SDOH Interventions:     Readmission Risk Interventions    08/06/2024    3:36 PM 07/16/2024    1:58 PM  Readmission Risk Prevention Plan  Transportation Screening Complete Complete  HRI or Home Care Consult  Complete  Social Work Consult for Recovery Care Planning/Counseling  Complete  Palliative Care Screening  Not Applicable  Medication Review Oceanographer) Complete Complete  PCP or Specialist appointment within 3-5 days of discharge Complete   HRI or Home Care Consult Complete   SW Recovery Care/Counseling Consult Complete   Palliative Care Screening Not Applicable   Skilled Nursing Facility Not Applicable

## 2024-08-06 NOTE — Progress Notes (Signed)
 PROGRESS NOTE    Nancy Eaton  FMW:[id number] DOB: 1956/05/16 DOA: 08/05/2024 PCP: Christopher Arland PARAS, PA-C   Brief Narrative:  Nancy Eaton is a 68 y.o. female with medical history significant for prediabetes, ulcerative colitis, hypertension, hyperlipidemia recently discharged from [facility] for ulcerative colitis flare on 8/29 now returns with dehydration, abdominal cramping and vomiting after starting acyclovir .  During her most recent hospitalization, she was treated with IV steroids, found to have subtherapeutic infliximab  levels and was started on Rinvoq .  Patient told me that this past week, her gastroenterologist Dr. Rollin had started her on Humira, and she developed a rash on her bottom.  She states that this was nonpainful.  Per the patient, Dr. Rollin was concerned that this may be herpes zoster, he started her on acyclovir  and as soon as she started taking that medication on Friday 9/12, she started having very severe abdominal cramping, associated nausea and vomiting.  She has essentially been unable to keep down food or most fluids since that day.  She denies any fevers, chest pain, she is not having worsening diarrhea, or any blood in her stools.   Assessment and Plan:  Nausea and vomiting: Without significant abdominal pain, diarrhea, or blood in the stool.  Coincides with starting acyclovir , so may be a reaction to this but could be other medication.  Mild leukocytosis but patient is on steroids, no focal abdominal tenderness, no evidence of acute infection. -Inpatient admission -Clear liquid diet for now and will go to SOFT  -Supportive care, discontinue acyclovir    Hyponatremia: Given the patient's recent vomiting, and evidence of dehydration on exam, this is likely a hypovolemic hyponatremia. -Hydrated with normal saline at 100 mL/hr -Na+ Trend:  Recent Labs  Lab 07/16/24 0525 07/17/24 0546 07/18/24 0516 07/19/24 0533 08/05/24 0956 08/05/24 1649 08/06/24 0413  NA  134* 135 133* 134* 128* 128* 128*  -CTM and Trend and repeat CMP in the AM   Buttock Rash: Consult WOC nurse   Ulcerative colitis-admitted to the hospital with recent flare, improved with IV steroids and initiation of Rinvoq .  GI Dr. Rollin at the time of admission, who did not feel that imaging was necessary at this point in time. -Continue prednisone  40 mg p.o. daily -Rinvoq  changed to Infliximab  by GI as she receives this 10 mg/kg infusion at 10 AM this morning with improvement in her symptoms.  She was approved for 10 mg/kg infliximab  as an outpatient plan is to place her on this medication every 4 weeks per GI and they are recommending a repeat trough and antibody level to be obtained.  She is also amenable to start AZA at this time and will be initiated on this in the outpatient setting   Essential Hypertension: Cozaar  was paused during last admission, blood pressure is currently normal so we will continue to hold this for the time being. CTM BP per Protocol. Last BP reading was 108/63   Type 2 Diabetes: most recent hemoglobin A1c 6.7, diet controlled -Moderate dose sliding scale. CTM CBGs per Protocol. CBG Trend:  Recent Labs  Lab 08/05/24 2057 08/06/24 0014 08/06/24 0442 08/06/24 0742 08/06/24 1200 08/06/24 1611 08/06/24 1952  GLUCAP 126* 92 91 85 124* 165* 187*    Chronic Iron  Deficiency Anemia: Hgb/Hct Trend:  Recent Labs  Lab 07/15/24 0614 07/16/24 0525 07/17/24 0546 07/18/24 0516 07/19/24 0533 08/05/24 0956 08/06/24 0413  HGB 9.5* 9.1* 9.5* 9.2* 9.2* 9.2* 8.8*  HCT 30.0* 29.4* 30.0* 29.1* 30.6* 29.7* 30.0*  MCV  82.2 82.4 82.0 82.7 83.2 82.5 85.0  -Check Anemia Panel in the AM; CTM Monitor for S/Sx of Bleeding; No overt bleeding noted. Repeat CBC in the AM   Chronic Thrombocytosis: Stable. Plt Count trend:  Recent Labs  Lab 07/15/24 0614 07/16/24 0525 07/17/24 0546 07/18/24 0516 07/19/24 0533 08/05/24 0956 08/06/24 0413  PLT 475* 469* 458* 431* 447* 534* 535*   CTM and Trend and repeat CMP in the AM   GERD/GI Prophylaxis: Continue PPI  Overweight: Complicates overall prognosis and care. Estimated body mass index is 29.8 kg/m as calculated from the following:   Height as of this encounter: 5' 2.5 (1.588 m).   Weight as of this encounter: 75.1 kg. Weight Loss and Dietary Counseling given   DVT prophylaxis: enoxaparin  (LOVENOX ) injection 40 mg Start: 08/05/24 2200    Code Status: Full Code Family Communication: No family present @ bedside  Disposition Plan:  Level of care: Med-Surg Status is: Inpatient Remains inpatient appropriate because: Further clinical improvement and clearance by GI   Consultants:  Gastroenterology  Procedures:  As delineated as above  Antimicrobials:  Anti-infectives (From admission, onward)    None       Subjective: Seen and examined at bedside I think she is doing little bit better today.  Complaining of discomfort but not as bad.  No nausea or vomiting.  No other concerns or complaints at this time.  Objective: Vitals:   08/06/24 0442 08/06/24 0857 08/06/24 1259 08/06/24 1955  BP: 115/67  126/84 108/63  Pulse: 84  88 80  Resp:   16 18  Temp: 98.1 F (36.7 C)  (!) 97.5 F (36.4 C) 98.2 F (36.8 C)  TempSrc:    Oral  SpO2: 94%  95% 98%  Weight:  75.1 kg    Height:  5' 2.5 (1.588 m)      Intake/Output Summary (Last 24 hours) at 08/06/2024 2128 Last data filed at 08/06/2024 1828 Gross per 24 hour  Intake 2242.44 ml  Output --  Net 2242.44 ml   Filed Weights   08/06/24 0857  Weight: 75.1 kg   Examination: Physical Exam:  Constitutional: WN/WD overweight Caucasian female in no acute distress Respiratory: Diminished to auscultation bilaterally, no wheezing, rales, rhonchi or crackles. Normal respiratory effort and patient is not tachypenic. No accessory muscle use.  Unlabored breathing Cardiovascular: RRR, no murmurs / rubs / gallops. S1 and S2 auscultated. No extremity edema. .   Abdomen: Soft, slightly-tender, tender secondary to body habitus bowel sounds positive.  GU: Deferred. Musculoskeletal: No clubbing / cyanosis of digits/nails. No joint deformity upper and lower extremities. Good ROM, no contractures. Normal strength and muscle tone.  Neurologic: CN 2-12 grossly intact with no focal deficits. Romberg sign and cerebellar reflexes not assessed.  Psychiatric: Normal judgment and insight. Alert and oriented x 3. Normal mood and appropriate affect.   Data Reviewed: I have personally reviewed following labs and imaging studies  CBC: Recent Labs  Lab 08/05/24 0956 08/06/24 0413  WBC 12.0* 12.7*  NEUTROABS 8.0*  --   HGB 9.2* 8.8*  HCT 29.7* 30.0*  MCV 82.5 85.0  PLT 534* 535*   Basic Metabolic Panel: Recent Labs  Lab 08/05/24 0956 08/05/24 1649 08/06/24 0413  NA 128* 128* 128*  K 4.1 4.5 4.0  CL 93* 94* 95*  CO2 26 23 21*  GLUCOSE 93 101* 89  BUN 15 13 14   CREATININE 0.65 0.57 0.53  CALCIUM  9.0 8.6* 8.4*   GFR: Estimated Creatinine Clearance: 63.7  mL/min (by C-G formula based on SCr of 0.53 mg/dL). Liver Function Tests: Recent Labs  Lab 08/05/24 0956  AST 13*  ALT 14  ALKPHOS 73  BILITOT 0.3  PROT 5.7*  ALBUMIN 3.1*   Recent Labs  Lab 08/05/24 0956  LIPASE 19   No results for input(s): AMMONIA in the last 168 hours. Coagulation Profile: No results for input(s): INR, PROTIME in the last 168 hours. Cardiac Enzymes: No results for input(s): CKTOTAL, CKMB, CKMBINDEX, TROPONINI in the last 168 hours. BNP (last 3 results) No results for input(s): PROBNP in the last 8760 hours. HbA1C: No results for input(s): HGBA1C in the last 72 hours. CBG: Recent Labs  Lab 08/06/24 0442 08/06/24 0742 08/06/24 1200 08/06/24 1611 08/06/24 1952  GLUCAP 91 85 124* 165* 187*   Lipid Profile: No results for input(s): CHOL, HDL, LDLCALC, TRIG, CHOLHDL, LDLDIRECT in the last 72 hours. Thyroid Function Tests: No  results for input(s): TSH, T4TOTAL, FREET4, T3FREE, THYROIDAB in the last 72 hours. Anemia Panel: No results for input(s): VITAMINB12, FOLATE, FERRITIN, TIBC, IRON , RETICCTPCT in the last 72 hours. Sepsis Labs: No results for input(s): PROCALCITON, LATICACIDVEN in the last 168 hours.  No results found for this or any previous visit (from the past 240 hours).   Radiology Studies: No results found.  Scheduled Meds:  atorvastatin   40 mg Oral QHS   enoxaparin  (LOVENOX ) injection  40 mg Subcutaneous Q24H   feeding supplement (GLUCERNA SHAKE)  237 mL Oral TID BM   ferrous sulfate   325 mg Oral QODAY   [START ON 08/07/2024] Influenza vac split trivalent PF  0.5 mL Intramuscular Tomorrow-1000   insulin  aspart  0-15 Units Subcutaneous Q4H   pantoprazole   40 mg Oral Daily   predniSONE   40 mg Oral Q breakfast   Continuous Infusions:  sodium chloride  Stopped (08/06/24 1708)    LOS: 1 day   Alejandro Marker, DO [facility] Available via Epic secure chat 7am-7pm After these hours, please refer to coverage provider listed on [email or web address] 08/06/2024, 9:28 PM

## 2024-08-06 NOTE — Progress Notes (Signed)
 Subjective: Feeling better.  Objective: Vital signs in last 24 hours: Temp:  [97.5 F (36.4 C)-99.4 F (37.4 C)] 97.5 F (36.4 C) (09/16 1259) Pulse Rate:  [84-93] 88 (09/16 1259) Resp:  [16-18] 16 (09/16 1259) BP: (103-126)/(58-84) 126/84 (09/16 1259) SpO2:  [94 %-97 %] 95 % (09/16 1259) Weight:  [75.1 kg] 75.1 kg (09/16 0857) Last BM Date : 08/05/24  Intake/Output from previous day: 09/15 0701 - 09/16 0700 In: 1387.4 [I.V.:1387.4] Out: -  Intake/Output this shift: Total I/O In: 461.7 [I.V.:461.7] Out: -   General appearance: alert and no distress GI: some lower abdominal tenderness. Skin: Ulcerated rash  Lab Results: Recent Labs    08/05/24 0956 08/06/24 0413  WBC 12.0* 12.7*  HGB 9.2* 8.8*  HCT 29.7* 30.0*  PLT 534* 535*   BMET Recent Labs    08/05/24 0956 08/05/24 1649 08/06/24 0413  NA 128* 128* 128*  K 4.1 4.5 4.0  CL 93* 94* 95*  CO2 26 23 21*  GLUCOSE 93 101* 89  BUN 15 13 14   CREATININE 0.65 0.57 0.53  CALCIUM  9.0 8.6* 8.4*   LFT Recent Labs    08/05/24 0956  PROT 5.7*  ALBUMIN 3.1*  AST 13*  ALT 14  ALKPHOS 73  BILITOT 0.3   PT/INR No results for input(s): LABPROT, INR in the last 72 hours. Hepatitis Panel No results for input(s): HEPBSAG, HCVAB, HEPAIGM, HEPBIGM in the last 72 hours. C-Diff No results for input(s): CDIFFTOX in the last 72 hours. Fecal Lactopherrin No results for input(s): FECLLACTOFRN in the last 72 hours.  Studies/Results: No results found.  Medications: Scheduled:  atorvastatin   40 mg Oral QHS   enoxaparin  (LOVENOX ) injection  40 mg Subcutaneous Q24H   feeding supplement (GLUCERNA SHAKE)  237 mL Oral TID BM   ferrous sulfate   325 mg Oral QODAY   [START ON 08/07/2024] Influenza vac split trivalent PF  0.5 mL Intramuscular Tomorrow-1000   insulin  aspart  0-15 Units Subcutaneous Q4H   pantoprazole   40 mg Oral Daily   predniSONE   40 mg Oral Q breakfast   Continuous:  sodium chloride  100  mL/hr at 08/06/24 1057    Assessment/Plan: 1) Severe left sided UC. 2) Buttocks rash - ? Zoster or pruritic pyoderma gangrenosum.   She received her infliximab  10 mg/kg infusion at 10 AM today and she was able to notice an improvement in her symptoms.  The lower abdominal cramping is improving.  She reports feeling like a normal person again.  As for her gluteal cleft rash, the etiology is unknown.  There is ulceration and it may be improving.  Plan: 1) Maintain prednisone  40 mg every day. 2) If she is well in the AM, she can be discharged home and she will remain on prednisone  40 mg.  She already has the medication at home and she will contact my by phone on Monday for an update and tapering instructions. 3) She was approved for the 10 mg/kg infliximab  as an outpatient.  The plan is to place her on the medication every 4 weeks.  A repeat trough and antibody level will be obtained.  She is amenable to starting AZA this time and this will be initiated as an outpatient.  LOS: 1 day   Cono Gebhard D 08/06/2024, 3:55 PM

## 2024-08-07 DIAGNOSIS — B028 Zoster with other complications: Secondary | ICD-10-CM | POA: Diagnosis not present

## 2024-08-07 DIAGNOSIS — K51919 Ulcerative colitis, unspecified with unspecified complications: Secondary | ICD-10-CM

## 2024-08-07 DIAGNOSIS — R21 Rash and other nonspecific skin eruption: Secondary | ICD-10-CM

## 2024-08-07 DIAGNOSIS — R1110 Vomiting, unspecified: Secondary | ICD-10-CM | POA: Diagnosis not present

## 2024-08-07 LAB — COMPREHENSIVE METABOLIC PANEL WITH GFR
ALT: 15 U/L (ref 0–44)
AST: 14 U/L — ABNORMAL LOW (ref 15–41)
Albumin: 2.9 g/dL — ABNORMAL LOW (ref 3.5–5.0)
Alkaline Phosphatase: 79 U/L (ref 38–126)
Anion gap: 12 (ref 5–15)
BUN: 9 mg/dL (ref 8–23)
CO2: 22 mmol/L (ref 22–32)
Calcium: 8.4 mg/dL — ABNORMAL LOW (ref 8.9–10.3)
Chloride: 96 mmol/L — ABNORMAL LOW (ref 98–111)
Creatinine, Ser: 0.61 mg/dL (ref 0.44–1.00)
GFR, Estimated: >60 mL/min
Glucose, Bld: 154 mg/dL — ABNORMAL HIGH (ref 70–99)
Potassium: 4.3 mmol/L (ref 3.5–5.1)
Sodium: 130 mmol/L — ABNORMAL LOW (ref 135–145)
Total Bilirubin: 0.2 mg/dL (ref 0.0–1.2)
Total Protein: 5.4 g/dL — ABNORMAL LOW (ref 6.5–8.1)

## 2024-08-07 LAB — CBC WITH DIFFERENTIAL/PLATELET
Abs Immature Granulocytes: 0.72 K/uL — ABNORMAL HIGH (ref 0.00–0.07)
Basophils Absolute: 0.1 K/uL (ref 0.0–0.1)
Basophils Relative: 1 %
Eosinophils Absolute: 0 K/uL (ref 0.0–0.5)
Eosinophils Relative: 0 %
HCT: 28.9 % — ABNORMAL LOW (ref 36.0–46.0)
Hemoglobin: 8.7 g/dL — ABNORMAL LOW (ref 12.0–15.0)
Immature Granulocytes: 5 %
Lymphocytes Relative: 11 %
Lymphs Abs: 1.5 K/uL (ref 0.7–4.0)
MCH: 25 pg — ABNORMAL LOW (ref 26.0–34.0)
MCHC: 30.1 g/dL (ref 30.0–36.0)
MCV: 83 fL (ref 80.0–100.0)
Monocytes Absolute: 1.2 K/uL — ABNORMAL HIGH (ref 0.1–1.0)
Monocytes Relative: 9 %
Neutro Abs: 10.7 K/uL — ABNORMAL HIGH (ref 1.7–7.7)
Neutrophils Relative %: 74 %
Platelets: 479 K/uL — ABNORMAL HIGH (ref 150–400)
RBC: 3.48 MIL/uL — ABNORMAL LOW (ref 3.87–5.11)
RDW: 16.9 % — ABNORMAL HIGH (ref 11.5–15.5)
Smear Review: NORMAL
WBC: 14.3 K/uL — ABNORMAL HIGH (ref 4.0–10.5)
nRBC: 0.1 % (ref 0.0–0.2)

## 2024-08-07 LAB — GLUCOSE, CAPILLARY
Glucose-Capillary: 129 mg/dL — ABNORMAL HIGH (ref 70–99)
Glucose-Capillary: 136 mg/dL — ABNORMAL HIGH (ref 70–99)
Glucose-Capillary: 167 mg/dL — ABNORMAL HIGH (ref 70–99)
Glucose-Capillary: 218 mg/dL — ABNORMAL HIGH (ref 70–99)
Glucose-Capillary: 226 mg/dL — ABNORMAL HIGH (ref 70–99)
Glucose-Capillary: 86 mg/dL (ref 70–99)
Glucose-Capillary: 94 mg/dL (ref 70–99)

## 2024-08-07 LAB — PHOSPHORUS: Phosphorus: 1.3 mg/dL — ABNORMAL LOW (ref 2.5–4.6)

## 2024-08-07 LAB — MAGNESIUM: Magnesium: 2.4 mg/dL (ref 1.7–2.4)

## 2024-08-07 MED ORDER — SODIUM CHLORIDE 0.9 % IV SOLN: INTRAVENOUS | Status: AC

## 2024-08-07 MED ORDER — ACYCLOVIR SODIUM 50 MG/ML IV SOLN
10.0000 mg/kg | Freq: Three times a day (TID) | INTRAVENOUS | Status: DC
  Administered 2024-08-07 – 2024-08-13 (×19): 750 mg via INTRAVENOUS
  Filled 2024-08-07 (×24): qty 15

## 2024-08-07 MED ORDER — ACYCLOVIR 400 MG PO TABS
800.0000 mg | ORAL_TABLET | Freq: Every day | ORAL | Status: DC
Start: 2024-08-07 — End: 2024-08-07
  Filled 2024-08-07: qty 2

## 2024-08-07 MED ORDER — SODIUM PHOSPHATES 45 MMOLE/15ML IV SOLN
30.0000 mmol | Freq: Once | INTRAVENOUS | Status: AC
  Administered 2024-08-07: 30 mmol via INTRAVENOUS
  Filled 2024-08-07: qty 10

## 2024-08-07 NOTE — Plan of Care (Signed)

## 2024-08-07 NOTE — Plan of Care (Signed)
  Problem: Activity: Goal: Risk for activity intolerance will decrease Outcome: Progressing   Problem: Nutrition: Goal: Adequate nutrition will be maintained Outcome: Progressing   Problem: Coping: Goal: Level of anxiety will decrease Outcome: Progressing   Problem: Elimination: Goal: Will not experience complications related to urinary retention Outcome: Progressing   Problem: Pain Managment: Goal: General experience of comfort will improve and/or be controlled Outcome: Progressing   Problem: Safety: Goal: Ability to remain free from injury will improve Outcome: Progressing

## 2024-08-07 NOTE — Consult Note (Addendum)
 WOC Nurse Consult Note: Reason for Consult: rash buttocks  Wound type: vesicles to low back, coccyx and buttocks r/t herpes zoster (patient was found to have herpes zoster and started on Acyclovir  9/12, apparently this medication made her nauseous and has been discontinued)  Pressure Injury POA: NA  Measurement:  Wound bed:  Drainage (amount, consistency, odor)  Periwound: Dressing procedure/placement/frequency: Cleanse buttocks/coccyx rash with soap and water, dry and cover with ABD pad and clothe tape. Would avoid silicone foam as this may hold moisture onto area and prevent drying.    POC discussed with bedside nurse. WOC team will not follow. Encouraged bedside nurse to contact infectious disease regarding any contact precautions as lesions appear wet.  Re-consult if further needs arise.   Thank you,    Powell Bar MSN, RN-BC, [employer]

## 2024-08-07 NOTE — Progress Notes (Signed)
 PROGRESS NOTE    Nancy Eaton  FMW:[id number] DOB: December 05, 1955 DOA: 08/05/2024 PCP: Christopher Arland PARAS, PA-C   Brief Narrative:  Nancy Eaton is a 68 y.o. female with medical history significant for prediabetes, ulcerative colitis, hypertension, hyperlipidemia recently discharged from [facility] for ulcerative colitis flare on 8/29 now returns with dehydration, abdominal cramping and vomiting.  Found to have another ulcerative colitis flare and initially there was concern that her symptoms were related to acyclovir  but she has active herpes zoster so she will be placed back on IV acyclovir  as it is more of an intolerance than allergy.  This hospitalization she received a dose of Inflectra  and Rinvoq  has been discontinued.   Assessment and Plan:  Nausea and vomiting in the setting of ulcerative colitis: Now having abdominal discomfort, loose stools.  Initially thought to be in setting of valacyclovir but she continues to have a mild leukocytosis but patient is on steroids, no focal abdominal tenderness, no evidence of acute infection. WBC Trend:  Recent Labs  Lab 07/16/24 0525 07/17/24 0546 07/18/24 0516 07/19/24 0533 08/05/24 0956 08/06/24 0413 08/07/24 1052  WBC 14.8* 16.9* 10.3 10.9* 12.0* 12.7* 14.3*  -Was on Soft Diet but having Sx so will cut down to FULL and her continued abdominal pain and loose stools -Supportive care, Resumed IV Acyclovir  as below   Hyponatremia: Given the patient's recent vomiting, and evidence of dehydration on exam, this is likely a hypovolemic hyponatremia. -Hydrating w/ with normal saline at 100 mL/hr and will replete w/ IV NaPhos 30 mmol -Na+ Trend:  Recent Labs  Lab 07/17/24 0546 07/18/24 0516 07/19/24 0533 08/05/24 0956 08/05/24 1649 08/06/24 0413 08/07/24 1052  NA 135 133* 134* 128* 128* 128* 130*  -CTM and Trend and repeat CMP in the AM   Hypophosphatemia: Phos Level is now 1.3. Replete w/ IV Sodium Phos 30 mmol. CTM and Replete as  Necessary. Repeat Phos Level in the AM  Buttock Rash in the setting of Active Herpes Zoster: Consult WOC nurse; Rash is Vesicular  ID consulted and Transferred her to Negative Pressure Room w/ Airborne and Contact Precautions. Getting IV Acyclovir  now. ID checking Varicella Zoster via PCR and including HSV PCR   Ulcerative colitis-admitted to the hospital with recent flare, improved with IV steroids and initiation of Rinvoq .  GI Dr. Rollin at the time of admission, who did not feel that imaging was necessary at this point in time. -Continue prednisone  40 mg p.o. daily -Rinvoq  changed to Infliximab  by GI as she receives this 10 mg/kg infusion at 10 AM on 9/16 with improvement in her symptoms.  She was approved for 10 mg/kg infliximab  as an outpatient plan is to place her on this medication every 4 weeks per GI and they are recommending a repeat trough and antibody level to be obtained.  She is also amenable to start AZA at this time and will be initiated on this in the outpatient setting -Still having some abdominal discomfort and loose stools so we will cut back her diet   Essential Hypertension: Cozaar  was paused during last admission, blood pressure is currently normal so we will continue to hold this for the time being. CTM BP per Protocol. Last BP reading was 108/63   Type 2 Diabetes: most recent hemoglobin A1c 6.7, diet controlled -Moderate dose sliding scale. CTM CBGs per Protocol. CBG Trend:  Recent Labs  Lab 08/06/24 1611 08/06/24 1952 08/07/24 0003 08/07/24 0341 08/07/24 0738 08/07/24 1237 08/07/24 1531  GLUCAP 165* 187* 129*  86 94 167* 218*    Chronic Iron  Deficiency Anemia: Hgb/Hct Trend:  Recent Labs  Lab 07/16/24 0525 07/17/24 0546 07/18/24 0516 07/19/24 0533 08/05/24 0956 08/06/24 0413 08/07/24 1052  HGB 9.1* 9.5* 9.2* 9.2* 9.2* 8.8* 8.7*  HCT 29.4* 30.0* 29.1* 30.6* 29.7* 30.0* 28.9*  MCV 82.4 82.0 82.7 83.2 82.5 85.0 83.0  -Check Anemia Panel in the AM; CTM Monitor  for S/Sx of Bleeding; No overt bleeding noted. Repeat CBC in the AM   Chronic Thrombocytosis: Stable. Plt Count trend:  Recent Labs  Lab 07/16/24 0525 07/17/24 0546 07/18/24 0516 07/19/24 0533 08/05/24 0956 08/06/24 0413 08/07/24 1052  PLT 469* 458* 431* 447* 534* 535* 479*  CTM and Trend and repeat CMP in the AM   GERD/GI Prophylaxis: Continue PPI\  Hypoalbuminemia: Patient's Albumin Trend: Recent Labs  Lab 07/13/24 0743 07/14/24 0604 07/15/24 0614 07/18/24 0516 07/19/24 0533 08/05/24 0956 08/07/24 1052  ALBUMIN 2.3* 2.5* 2.3* 3.1* 3.1* 3.1* 2.9*  -Continue to Monitor and Trend and repeat CMP in the AM  Overweight: Complicates overall prognosis and care. Estimated body mass index is 29.8 kg/m as calculated from the following:   Height as of this encounter: 5' 2.5 (1.588 m).   Weight as of this encounter: 75.1 kg. Weight Loss and Dietary Counseling given   DVT prophylaxis: enoxaparin  (LOVENOX ) injection 40 mg Start: 08/05/24 2200    Code Status: Full Code Family Communication: No family present @ bedside   Disposition Plan:  Level of care: Med-Surg Status is: Inpatient Remains inpatient appropriate because: Needs further clinical improvement and clearance by the Specialists    Consultants:  Gastroenterology  Infectious Diseases   Procedures:  As delineated as above   Antimicrobials:  Anti-infectives (From admission, onward)    Start     Dose/Rate Route Frequency Ordered Stop   08/07/24 1400  acyclovir  (ZOVIRAX ) 750 mg in dextrose  5 % 250 mL IVPB        10 mg/kg  75.1 kg 265 mL/hr over 60 Minutes Intravenous Every 8 hours 08/07/24 1141     08/07/24 1000  acyclovir  (ZOVIRAX ) tablet 800 mg  Status:  Discontinued        800 mg Oral 5 times daily 08/07/24 0831 08/07/24 0839       Subjective: Seen and examined at bedside and she was still having symptoms and complaining of abdominal discomfort and nausea.  Discussed with her about her soft diet and will  cut her back to full liquid diet.  Denies any lightheadedness.  Having abdominal cramping and increased loose stools.  Describes a burning sensation in her buttocks but no real pain.  Objective: Vitals:   08/06/24 1955 08/07/24 0344 08/07/24 1247 08/07/24 1533  BP: 108/63 131/64 134/63 134/74  Pulse: 80 74 81 84  Resp: 18 20 16 16   Temp: 98.2 F (36.8 C) 98.1 F (36.7 C) 98.5 F (36.9 C) 98.1 F (36.7 C)  TempSrc: Oral   Oral  SpO2: 98%  94% 98%  Weight:      Height:        Intake/Output Summary (Last 24 hours) at 08/07/2024 1854 Last data filed at 08/07/2024 1610 Gross per 24 hour  Intake 3818.72 ml  Output --  Net 3818.72 ml   Filed Weights   08/06/24 0857  Weight: 75.1 kg   Examination: Physical Exam:  Constitutional: WN/WD overweight Caucasian female in no acute distress appears little uncomfortable Respiratory: Diminished to auscultation bilaterally, no wheezing, rales, rhonchi or crackles. Normal respiratory effort  and patient is not tachypenic. No accessory muscle use.  Unlabored breathing Cardiovascular: RRR, no murmurs / rubs / gallops. S1 and S2 auscultated. No extremity edema.  Abdomen: Soft, a little-tender, distended secondary to body habitus.  Bowel sounds positive.  GU: Deferred. Musculoskeletal: No clubbing / cyanosis of digits/nails. No joint deformity upper and lower extremities.  Neurologic: CN 2-12 grossly intact with no focal deficits. Romberg sign and cerebellar reflexes not assessed.  Psychiatric: Normal judgment and insight. Alert and oriented x 3. Normal mood and appropriate affect.   Data Reviewed: I have personally reviewed following labs and imaging studies  CBC: Recent Labs  Lab 08/05/24 0956 08/06/24 0413 08/07/24 1052  WBC 12.0* 12.7* 14.3*  NEUTROABS 8.0*  --  10.7*  HGB 9.2* 8.8* 8.7*  HCT 29.7* 30.0* 28.9*  MCV 82.5 85.0 83.0  PLT 534* 535* 479*   Basic Metabolic Panel: Recent Labs  Lab 08/05/24 0956 08/05/24 1649  08/06/24 0413 08/07/24 1052  NA 128* 128* 128* 130*  K 4.1 4.5 4.0 4.3  CL 93* 94* 95* 96*  CO2 26 23 21* 22  GLUCOSE 93 101* 89 154*  BUN 15 13 14 9   CREATININE 0.65 0.57 0.53 0.61  CALCIUM  9.0 8.6* 8.4* 8.4*  MG  --   --   --  2.4  PHOS  --   --   --  1.3*   GFR: Estimated Creatinine Clearance: 63.7 mL/min (by C-G formula based on SCr of 0.61 mg/dL). Liver Function Tests: Recent Labs  Lab 08/05/24 0956 08/07/24 1052  AST 13* 14*  ALT 14 15  ALKPHOS 73 79  BILITOT 0.3 0.2  PROT 5.7* 5.4*  ALBUMIN 3.1* 2.9*   Recent Labs  Lab 08/05/24 0956  LIPASE 19   No results for input(s): AMMONIA in the last 168 hours. Coagulation Profile: No results for input(s): INR, PROTIME in the last 168 hours. Cardiac Enzymes: No results for input(s): CKTOTAL, CKMB, CKMBINDEX, TROPONINI in the last 168 hours. BNP (last 3 results) No results for input(s): PROBNP in the last 8760 hours. HbA1C: No results for input(s): HGBA1C in the last 72 hours. CBG: Recent Labs  Lab 08/07/24 0003 08/07/24 0341 08/07/24 0738 08/07/24 1237 08/07/24 1531  GLUCAP 129* 86 94 167* 218*   Lipid Profile: No results for input(s): CHOL, HDL, LDLCALC, TRIG, CHOLHDL, LDLDIRECT in the last 72 hours. Thyroid Function Tests: No results for input(s): TSH, T4TOTAL, FREET4, T3FREE, THYROIDAB in the last 72 hours. Anemia Panel: No results for input(s): VITAMINB12, FOLATE, FERRITIN, TIBC, IRON , RETICCTPCT in the last 72 hours. Sepsis Labs: No results for input(s): PROCALCITON, LATICACIDVEN in the last 168 hours.  No results found for this or any previous visit (from the past 240 hours).   Radiology Studies: No results found.  Scheduled Meds:  atorvastatin   40 mg Oral QHS   enoxaparin  (LOVENOX ) injection  40 mg Subcutaneous Q24H   feeding supplement (GLUCERNA SHAKE)  237 mL Oral TID BM   ferrous sulfate   325 mg Oral QODAY   insulin  aspart  0-15 Units  Subcutaneous Q4H   pantoprazole   40 mg Oral Daily   predniSONE   40 mg Oral Q breakfast   Continuous Infusions:  sodium chloride      acyclovir  750 mg (08/07/24 1504)   sodium PHOSPHATE  IVPB (in mmol)      LOS: 2 days   Alejandro Marker, DO [facility] Available via Epic secure chat 7am-7pm After these hours, please refer to coverage provider listed on [email or web address] 08/07/2024,  6:54 PM

## 2024-08-07 NOTE — Progress Notes (Signed)
 Subjective: Patient continues to complain of abdominal pain and loose stools.  She received infliximab  yesterday.  She got some narcotics for the abdominal pain which has made her little more comfortable now.  She denies having any nausea or vomiting. There is no history of melena hematochezia.  Objective: Vital signs in last 24 hours: Temp:  [98.1 F (36.7 C)-98.5 F (36.9 C)] 98.5 F (36.9 C) (09/17 1247) Pulse Rate:  [74-81] 81 (09/17 1247) Resp:  [16-20] 16 (09/17 1247) BP: (108-134)/(63-64) 134/63 (09/17 1247) SpO2:  [94 %-98 %] 94 % (09/17 1247) Last BM Date : 08/05/24  Intake/Output from previous day: 09/16 0701 - 09/17 0700 In: 2789.9 [P.O.:480; I.V.:2309.9] Out: -  Intake/Output this shift: Total I/O In: 960 [P.O.:960] Out: -   General appearance: alert, obese, cooperative, and no distress Resp: CTA, no rales , rhonchi or wheezing Cardio: S1 & S2 regular. GI: soft, obese, NTNABS  Lab Results: Recent Labs    08/05/24 0956 08/06/24 0413 08/07/24 1052  WBC 12.0* 12.7* 14.3*  HGB 9.2* 8.8* 8.7*  HCT 29.7* 30.0* 28.9*  PLT 534* 535* 479*   BMET Recent Labs    08/05/24 1649 08/06/24 0413 08/07/24 1052  NA 128* 128* 130*  K 4.5 4.0 4.3  CL 94* 95* 96*  CO2 23 21* 22  GLUCOSE 101* 89 154*  BUN 13 14 9   CREATININE 0.57 0.53 0.61  CALCIUM  8.6* 8.4* 8.4*   LFT Recent Labs    08/07/24 1052  PROT 5.4*  ALBUMIN 2.9*  AST 14*  ALT 15  ALKPHOS 79  BILITOT 0.2    No results found.  Medications: I have reviewed the patient's current medications. Prior to Admission:  Medications Prior to Admission  Medication Sig Dispense Refill Last Dose/Taking   acetaminophen  (TYLENOL ) 500 MG tablet Take 1,000 mg by mouth every 6 (six) hours as needed (for pain).   Past Week   Ascorbic Acid  (VITAMIN C ) 1000 MG tablet Take 1,000 mg by mouth every other day.   Past Week   atorvastatin  (LIPITOR) 40 MG tablet Take 1 tablet (40 mg total) by mouth daily. (Patient taking  differently: Take 40 mg by mouth at bedtime.) 90 tablet 0 08/04/2024 Bedtime   feeding supplement, GLUCERNA SHAKE, (GLUCERNA SHAKE) LIQD Take 237 mLs by mouth 3 (three) times daily between meals.   08/04/2024 Evening   ferrous sulfate  325 (65 FE) MG tablet Take 325 mg by mouth See admin instructions. Take 325 mg by mouth every other morning with breakfast   Past Week   INFLECTRA  100 MG SOLR Inject 100 mg into the vein every 2 (two) months.   05/27/2024   [Paused] losartan  (COZAAR ) 50 MG tablet Take 25 mg by mouth in the morning.   08/04/2024 Morning   melatonin 5 MG TABS Take 5 mg by mouth at bedtime as needed (for sleep).   Unknown   Multiple Vitamins-Minerals (ONE-A-DAY WOMENS 50+ PO) Take 1 tablet by mouth daily with breakfast.   08/04/2024 Morning   Omega-3 Fatty Acids (FISH OIL PO) Take 1,400 mg by mouth daily.   Past Week   ondansetron  (ZOFRAN -ODT) 4 MG disintegrating tablet Dissolve 1 tablet (4 mg total) by mouth every 8 (eight) hours as needed for nausea or vomiting. (Patient taking differently: Take 4 mg by mouth every 8 (eight) hours as needed for nausea or vomiting (dissolve orally).) 20 tablet 0 Unknown   pantoprazole  (PROTONIX ) 40 MG tablet Take 1 tablet (40 mg total) by mouth daily. (Patient taking  differently: Take 40 mg by mouth daily before breakfast.) 30 tablet 1 08/04/2024 Morning   predniSONE  (DELTASONE ) 20 MG tablet Take 2 tablets (40 mg total) by mouth daily with breakfast. 60 tablet 0 08/04/2024 Morning   sodium chloride  0.9 % SOLN See admin instructions. As directed- in conjunction with Inflectra  every 2 months   05/27/2024   SYSTANE COMPLETE 0.6 % SOLN Place 1 drop into both eyes 3 (three) times daily as needed (for dryness).   Past Week   [DISCONTINUED] Ascorbic Acid  (C EXTRA STRENGTH) 1000 MG TABS Take 1 tablet by mouth every other day. (Patient not taking: Reported on 07/11/2024)      [DISCONTINUED] oxyCODONE  (ROXICODONE ) 5 MG immediate release tablet Take 1 tablet (5 mg total) by  mouth every 4 (four) hours as needed for severe pain (pain score 7-10). 20 tablet 0    Scheduled:  atorvastatin   40 mg Oral QHS   enoxaparin  (LOVENOX ) injection  40 mg Subcutaneous Q24H   feeding supplement (GLUCERNA SHAKE)  237 mL Oral TID BM   ferrous sulfate   325 mg Oral QODAY   insulin  aspart  0-15 Units Subcutaneous Q4H   pantoprazole   40 mg Oral Daily   predniSONE   40 mg Oral Q breakfast   Continuous:  sodium chloride  100 mL/hr at 08/07/24 0851   acyclovir  750 mg (08/07/24 1504)   PRN:acetaminophen  **OR** acetaminophen , albuterol , artificial tears, HYDROmorphone  (DILAUDID ) injection, melatonin, ondansetron  **OR** ondansetron  (ZOFRAN ) IV, traZODone   Assessment/Plan: 1) Ulcerative colitis with abdominal pain and loose stools.Patient received a dose of Inflectra  exact infliximab  yesterday hopefully this will help her symptoms. 2) Chronic iron  deficiency anemia secondary to ulcerative colitis. 3) Chronic thrombocytosis secondary to UC. 4) GERD on PPI. 5) Morbid obesity  LOS: 2 days   [name] 08/07/2024, 3:09 PM

## 2024-08-07 NOTE — Progress Notes (Signed)
 Pharmacy Antibiotic Note  Nancy Eaton is a 68 y.o. female admitted on 08/05/2024 with possible herpes zoster.  Pharmacy has been consulted for Acyclovir  dosing. Of note, patient reported nausea/vomiting to valacylovir on 9/12 - was evaluated by ID physician and plan is to proceed with treatment for possible herpes zoster with IV acyclovir .  Plan: Start IV Acyclovir  750mg  q8 hours Continue IV NS 100mL/hr (as ordered by MD) Monitor BMP, CBC  Height: 5' 2.5 (158.8 cm) Weight: 75.1 kg (165 lb 9.1 oz) IBW/kg (Calculated) : 51.25  Temp (24hrs), Avg:97.9 F (36.6 C), Min:97.5 F (36.4 C), Max:98.2 F (36.8 C)  Recent Labs  Lab 08/05/24 0956 08/05/24 1649 08/06/24 0413 08/07/24 1052  WBC 12.0*  --  12.7* 14.3*  CREATININE 0.65 0.57 0.53 0.61    Estimated Creatinine Clearance: 63.7 mL/min (by C-G formula based on SCr of 0.61 mg/dL).    Allergies  Allergen Reactions   Azithromycin  Nausea And Vomiting and Other (See Comments)    Stomach pain, also   Clindamycin /Lincomycin Nausea And Vomiting and Other (See Comments)    Stomach pain, also (Clindamycin )   Humira (1 Pen) [Adalimumab] Rash   Penicillins Rash and Other (See Comments)    Happened 20 years ago Did it involve sudden or severe rash/hives, skin peeling, or any reaction on the inside of your mouth or nose? Yes     Rinvoq  [Upadacitinib ] Rash   Valacyclovir Nausea And Vomiting, Rash and Other (See Comments)    Stomach cramps, also    Thank you for allowing pharmacy to be a part of this patient's care.  Lexee Brashears L Tyffany Waldrop 08/07/2024 11:46 AM

## 2024-08-07 NOTE — Consult Note (Addendum)
 [facility]                                                                                        Patient Identification: Patient Name: Nancy Eaton MRN: [id number] Admit Date: 08/05/2024  8:11 AM Today's Date: 08/07/2024 Reason for consult: Buttock rash Requesting provider: Dr. Sherrill  Principal Problem:   Intractable vomiting   Antibiotics None   Lines/Hardware: RT TKA  Assessment # Vesicular rash in the b/l buttock , concerns for at least 2 dermatomal, b/l involvement in an immunocompromised host  - No concerns for CNS or eye/ear involvement  # UC  - on prednisone  40 mg daily and infliximab  per GI - Follow-up with GI  Recommendations  - Will start on IV acyclovir .  Reaction to valacyclovir is more of an intolerance rather than true allergy.  - Will collect VZV PCR including HSV PCR, instructed RN for collection - Monitor CBC and BMP - Needs adequate hydration while on IV acyclovir  - Will place on airborne/contact precautions D/w primary team   Rest of the management as per the primary team. Please call with questions or concerns.  Thank you for the consult  __________________________________________________________________________________________________________ HPI and Hospital Course: 68 year old female with prior history of prediabetes, HTN, HLD, UC, recent discharged on 8/29 after hospitalization significant for acute ulcerative colitis flare who presented to the ED on 9/15 with abdominal pain, nausea and vomiting after starting Valacyclovir on 9/12  Recent hospital admission 8/20-8/29 remarkable for acute UC flare and was started on steroid, found to have subtherapeutic level of infliximab  and was started on Rinvoq .  Followed up by GI outpatient and  was started on Humira, also started on Valacyclovir on 9/12 for concern of shingles in the buttock after which she  started having severe abdominal cramps associated with nausea and vomiting and unable to keep food down.  No reported fever, chills, shortness of breath, cough or chest pain.  She is unable to tell me when rashes started as she is not able to look but describes burning sensation, no clear pain.  Reports history of chickenpox as a child and also reports receiving 2 doses of singles vaccine.  Denies, headache, blurry vision, neck pain or any neurological concerns. Denies any hearing concerns.   At ED afebrile Labs remarkable for NA 128, albumin 3.1, AST 13, WBC 12 UA not suggestive of UTI  Seen by GI and continued on prednisone  as well as started on infliximab  9/16 Patient reports improvement in abdominal cramps as well as frequency of diarrhea today   ROS: General- Denies fever, chills, loss of appetite and loss of weight HEENT - Denies headache, blurry vision, neck pain, sinus pain Chest - Denies any chest pain, SOB or cough CVS- Denies any dizziness/lightheadedness, syncopal attacks, palpitations Abdomen- Denies any nausea, vomiting, abdominal pain, hematochezia Neuro - Denies any weakness, numbness, tingling sensation Psych - Denies any changes in mood irritability or depressive symptoms GU- Denies any burning, dysuria, hematuria or increased frequency of urination Skin -buttock rash MSK - denies any joint pain/swelling or restricted ROM   Past Medical History:  Diagnosis Date  Arthritis    Knee   Hyperlipidemia    Lovastatin  started approx 2011   IFG (impaired fasting glucose) 2018   HbA1c 5.9% (stable)   PONV (postoperative nausea and vomiting)    Pre-diabetes    Right knee pain 12/2019   End stage: TKA recommended by ortho 01/2020->pt considering   Sigmoid diverticulosis 04/2010                    UC (ulcerative colitis) [facility])    Past Surgical History:  Procedure Laterality Date   BREAST ENHANCEMENT SURGERY     As of mammogram 12/2017--rupture of implants stable.    CARPAL TUNNEL RELEASE     CESAREAN SECTION     COLONOSCOPY  age 19   [facility]--recall 10 yrs   FLEXIBLE SIGMOIDOSCOPY N/A 02/02/2024   Procedure: KINGSTON SIDE;  Surgeon: Rollin Dover, MD;  Location: [facility];  Service: Gastroenterology;  Laterality: N/A;   TOTAL KNEE ARTHROPLASTY Right 07/17/2020   Procedure: RIGHT TOTAL KNEE ARTHROPLASTY;  Surgeon: Vernetta Lonni GRADE, MD;  Location: [facility];  Service: Orthopedics;  Laterality: Right;   Scheduled Meds:  atorvastatin   40 mg Oral QHS   enoxaparin  (LOVENOX ) injection  40 mg Subcutaneous Q24H   feeding supplement (GLUCERNA SHAKE)  237 mL Oral TID BM   ferrous sulfate   325 mg Oral QODAY   insulin  aspart  0-15 Units Subcutaneous Q4H   pantoprazole   40 mg Oral Daily   predniSONE   40 mg Oral Q breakfast   Continuous Infusions:  sodium chloride  100 mL/hr at 08/07/24 0851   PRN Meds:.acetaminophen  **OR** acetaminophen , albuterol , artificial tears, HYDROmorphone  (DILAUDID ) injection, melatonin, ondansetron  **OR** ondansetron  (ZOFRAN ) IV, traZODone   Allergies  Allergen Reactions   Azithromycin  Nausea And Vomiting and Other (See Comments)    Stomach pain, also   Clindamycin /Lincomycin Nausea And Vomiting and Other (See Comments)    Stomach pain, also (Clindamycin )   Humira (1 Pen) [Adalimumab] Rash   Penicillins Rash and Other (See Comments)    Happened 20 years ago Did it involve sudden or severe rash/hives, skin peeling, or any reaction on the inside of your mouth or nose? Yes     Rinvoq  [Upadacitinib ] Rash   Valacyclovir Nausea And Vomiting, Rash and Other (See Comments)    Stomach cramps, also   Family History  Problem Relation Age of Onset   Cancer Mother        Brain   Alcohol  abuse Father    COPD Brother    ADD / ADHD Son    Anxiety disorder Son    Heart disease Brother    Vitals BP 131/64 (BP Location: Right Arm)   Pulse 74   Temp 98.1 F (36.7 C)   Resp 20   Ht 5' 2.5 (1.588 m)   Wt 75.1 kg    SpO2 98%   BMI 29.80 kg/m    Physical Exam Constitutional: Adult female lying in the bed, not in acute distress    Comments: HEENT WNL  Cardiovascular:     Rate and Rhythm: Normal rate and regular rhythm.     Heart sounds: S1 and S2  Pulmonary:     Effort: Pulmonary effort is normal.     Comments: Normal breath sounds  Abdominal:     Palpations: Abdomen is soft.     Tenderness: Nondistended and nontender  Musculoskeletal:        General: No swelling or tenderness in peripheral joints.  No signs of septic joint  Skin:  Comments: Ruptured vesicles in bilateral buttocks      Neurological:     General: Awake, alert and oriented, grossly nonfocal  Psychiatric:        Mood and Affect: Mood normal.    Pertinent Microbiology Results for orders placed or performed during the hospital encounter of 07/10/24  Gastrointestinal Panel by PCR , Stool     Status: None   Collection Time: 07/12/24 11:31 AM   Specimen: Stool  Result Value Ref Range Status   Campylobacter species NOT DETECTED NOT DETECTED Final   Plesimonas shigelloides NOT DETECTED NOT DETECTED Final   Salmonella species NOT DETECTED NOT DETECTED Final   Yersinia enterocolitica NOT DETECTED NOT DETECTED Final   Vibrio species NOT DETECTED NOT DETECTED Final   Vibrio cholerae NOT DETECTED NOT DETECTED Final   Enteroaggregative E coli (EAEC) NOT DETECTED NOT DETECTED Final   Enteropathogenic E coli (EPEC) NOT DETECTED NOT DETECTED Final   Enterotoxigenic E coli (ETEC) NOT DETECTED NOT DETECTED Final   Shiga like toxin producing E coli (STEC) NOT DETECTED NOT DETECTED Final   Shigella/Enteroinvasive E coli (EIEC) NOT DETECTED NOT DETECTED Final   Cryptosporidium NOT DETECTED NOT DETECTED Final   Cyclospora cayetanensis NOT DETECTED NOT DETECTED Final   Entamoeba histolytica NOT DETECTED NOT DETECTED Final   Giardia lamblia NOT DETECTED NOT DETECTED Final   Adenovirus F40/41 NOT DETECTED NOT DETECTED Final    Astrovirus NOT DETECTED NOT DETECTED Final   Norovirus GI/GII NOT DETECTED NOT DETECTED Final   Rotavirus A NOT DETECTED NOT DETECTED Final   Sapovirus (I, II, IV, and V) NOT DETECTED NOT DETECTED Final    Comment: Performed at [facility], [address] [place], [place] [address]  C Difficile Quick Screen (NO PCR Reflex)     Status: None   Collection Time: 07/14/24  5:24 PM   Specimen: STOOL  Result Value Ref Range Status   C Diff antigen NEGATIVE NEGATIVE Final   C Diff toxin NEGATIVE NEGATIVE Final   C Diff interpretation No C. difficile detected.  Final    Comment: Performed at [facility], [address], [place], [place] [address]  Calprotectin, Fecal     Status: Abnormal   Collection Time: 07/14/24  5:24 PM   Specimen: STOOL  Result Value Ref Range Status   Calprotectin, Fecal >8,000 (H) 0 - 120 ug/g Final    Comment: (NOTE) **Results verified by repeat testing** Concentration     Interpretation   Follow-Up < 5 - 50 ug/g     Normal           None >50 -120 ug/g     Borderline       Re-evaluate in 4-6 weeks    >120 ug/g     Abnormal         Repeat as clinically                                   indicated Performed At: [facility] [address] [place], [place] [id number] Jennette Shorter MD Ey:[id number]    Pertinent Lab seen by me:    Latest Ref Rng & Units 08/06/2024    4:13 AM 08/05/2024    9:56 AM 07/19/2024    5:33 AM  CBC  WBC 4.0 - 10.5 K/uL 12.7  12.0  10.9   Hemoglobin 12.0 - 15.0 g/dL 8.8  9.2  9.2  Hematocrit 36.0 - 46.0 % 30.0  29.7  30.6   Platelets 150 - 400 K/uL 535  534  447       Latest Ref Rng & Units 08/06/2024    4:13 AM 08/05/2024    4:49 PM 08/05/2024    9:56 AM  CMP  Glucose 70 - 99 mg/dL 89  898  93   BUN 8 - 23 mg/dL 14  13  15    Creatinine 0.44 - 1.00 mg/dL 9.46  9.42  9.34   Sodium 135 - 145 mmol/L 128  128  128   Potassium 3.5 - 5.1 mmol/L 4.0  4.5  4.1   Chloride 98 - 111 mmol/L 95  94  93    CO2 22 - 32 mmol/L 21  23  26    Calcium  8.9 - 10.3 mg/dL 8.4  8.6  9.0   Total Protein 6.5 - 8.1 g/dL   5.7   Total Bilirubin 0.0 - 1.2 mg/dL   0.3   Alkaline Phos 38 - 126 U/L   73   AST 15 - 41 U/L   13   ALT 0 - 44 U/L   14      Pertinent Imagings/Other Imagings Plain films and CT images have been personally visualized and interpreted; radiology reports have been reviewed. Decision making incorporated into the Impression / Recommendations.  CT ABDOMEN PELVIS W CONTRAST Result Date: 07/10/2024 CLINICAL DATA:  Abdominal pain, nausea, vomiting EXAM: CT ABDOMEN AND PELVIS WITH CONTRAST TECHNIQUE: Multidetector CT imaging of the abdomen and pelvis was performed using the standard protocol following bolus administration of intravenous contrast. RADIATION DOSE REDUCTION: This exam was performed according to the departmental dose-optimization program which includes automated exposure control, adjustment of the mA and/or kV according to patient size and/or use of iterative reconstruction technique. CONTRAST:  OMNIPAQUE  IOHEXOL  300 MG/ML  SOLN COMPARISON:  06/23/2024 FINDINGS: Lower chest: No acute findings Hepatobiliary: Right lobe hemangioma again noted measuring up to 3.2 cm in greatest diameter, unchanged. No suspicious focal hepatic abnormality or new abnormality. Gallbladder unremarkable. Pancreas: No focal abnormality or ductal dilatation. Spleen: No focal abnormality.  Normal size. Adrenals/Urinary Tract: No adrenal abnormality. No focal renal abnormality. No stones or hydronephrosis. Urinary bladder is unremarkable. Stomach/Bowel: There is colonic wall thickening noted from the splenic flexure through the sigmoid colon, similar to prior study compatible with colitis. Large stool burden in the colon proximal to the colitis. This is increased since prior study. Vascular/Lymphatic: No evidence of aneurysm or adenopathy. Aortic atherosclerosis. Reproductive: Uterus and adnexa unremarkable.  No  mass. Other: No free fluid or free air. Musculoskeletal: No acute bony abnormality. IMPRESSION: Continued abnormal colonic wall thickening from the splenic flexure through the sigmoid colon compatible with infectious or inflammatory colitis. This is similar to prior study. Large stool burden throughout the colon proximal to the colitis, increasing since prior study. Aortic atherosclerosis. Electronically Signed   By: Franky Crease M.D.   On: 07/10/2024 23:32    I spent 85 minutes involved in face-to-face and non-face-to-face activities for this patient on the day of the visit. Professional time spent includes the following activities: Preparing to see the patient (review of tests), Obtaining and reviewing separately obtained history (discharge record 8/29, ED note, H&P, GI notes and hospitalist progress note), Performing a medically appropriate examination and evaluation, Ordering medications/labs, referring and communicating with other health care professionals including primary team and pharmacy, Documenting clinical information in the EMR, Independently interpreting results (not separately reported), Communicating  results to the patient, Counseling and educating the patient and Care coordination (not separately reported).  Electronically signed by:   Plan d/w requesting provider as well as ID pharm D  Of note, portions of this note may have been created with voice recognition software. While this note has been edited for accuracy, occasional wrong-word or 'sound-a-like' substitutions may have occurred due to the inherent limitations of voice recognition software.   Annalee Orem, MD Infectious Disease Physician [facility] Pager: [phone]

## 2024-08-08 DIAGNOSIS — B028 Zoster with other complications: Secondary | ICD-10-CM | POA: Diagnosis not present

## 2024-08-08 DIAGNOSIS — K51919 Ulcerative colitis, unspecified with unspecified complications: Secondary | ICD-10-CM | POA: Diagnosis not present

## 2024-08-08 DIAGNOSIS — R1110 Vomiting, unspecified: Secondary | ICD-10-CM | POA: Diagnosis not present

## 2024-08-08 LAB — COMPREHENSIVE METABOLIC PANEL WITH GFR
ALT: 15 U/L (ref 0–44)
AST: <10 U/L — ABNORMAL LOW (ref 15–41)
Albumin: 2.7 g/dL — ABNORMAL LOW (ref 3.5–5.0)
Alkaline Phosphatase: 71 U/L (ref 38–126)
Anion gap: 11 (ref 5–15)
BUN: 6 mg/dL — ABNORMAL LOW (ref 8–23)
CO2: 24 mmol/L (ref 22–32)
Calcium: 8.4 mg/dL — ABNORMAL LOW (ref 8.9–10.3)
Chloride: 100 mmol/L (ref 98–111)
Creatinine, Ser: 0.46 mg/dL (ref 0.44–1.00)
GFR, Estimated: >60 mL/min (ref >60)
Glucose, Bld: 69 mg/dL — ABNORMAL LOW (ref 70–99)
Potassium: 4 mmol/L (ref 3.5–5.1)
Sodium: 136 mmol/L (ref 135–145)
Total Bilirubin: 0.3 mg/dL (ref 0.0–1.2)
Total Protein: 5.1 g/dL — ABNORMAL LOW (ref 6.5–8.1)

## 2024-08-08 LAB — CBC WITH DIFFERENTIAL/PLATELET
Abs Immature Granulocytes: 0.43 K/uL — ABNORMAL HIGH (ref 0.00–0.07)
Basophils Absolute: 0.1 K/uL (ref 0.0–0.1)
Basophils Relative: 1 %
Eosinophils Absolute: 0 K/uL (ref 0.0–0.5)
Eosinophils Relative: 0 %
HCT: 27.7 % — ABNORMAL LOW (ref 36.0–46.0)
Hemoglobin: 8.7 g/dL — ABNORMAL LOW (ref 12.0–15.0)
Immature Granulocytes: 4 %
Lymphocytes Relative: 25 %
Lymphs Abs: 2.9 K/uL (ref 0.7–4.0)
MCH: 26.2 pg (ref 26.0–34.0)
MCHC: 31.4 g/dL (ref 30.0–36.0)
MCV: 83.4 fL (ref 80.0–100.0)
Monocytes Absolute: 1.5 K/uL — ABNORMAL HIGH (ref 0.1–1.0)
Monocytes Relative: 13 %
Neutro Abs: 6.7 K/uL (ref 1.7–7.7)
Neutrophils Relative %: 57 %
Platelets: 462 K/uL — ABNORMAL HIGH (ref 150–400)
RBC: 3.32 MIL/uL — ABNORMAL LOW (ref 3.87–5.11)
RDW: 16.9 % — ABNORMAL HIGH (ref 11.5–15.5)
WBC: 11.6 K/uL — ABNORMAL HIGH (ref 4.0–10.5)
nRBC: 0 % (ref 0.0–0.2)

## 2024-08-08 LAB — GLUCOSE, CAPILLARY
Glucose-Capillary: 69 mg/dL — ABNORMAL LOW (ref 70–99)
Glucose-Capillary: 70 mg/dL (ref 70–99)
Glucose-Capillary: 116 mg/dL — ABNORMAL HIGH (ref 70–99)
Glucose-Capillary: 172 mg/dL — ABNORMAL HIGH (ref 70–99)
Glucose-Capillary: 296 mg/dL — ABNORMAL HIGH (ref 70–99)
Glucose-Capillary: 186 mg/dL — ABNORMAL HIGH (ref 70–99)

## 2024-08-08 LAB — PHOSPHORUS: Phosphorus: 3.8 mg/dL (ref 2.5–4.6)

## 2024-08-08 LAB — MAGNESIUM: Magnesium: 2.5 mg/dL — ABNORMAL HIGH (ref 1.7–2.4)

## 2024-08-08 MED ORDER — SODIUM CHLORIDE 0.9 % IV SOLN
INTRAVENOUS | Status: DC
Start: 2024-08-08 — End: 2024-08-13

## 2024-08-08 NOTE — Progress Notes (Signed)
 PROGRESS NOTE    Nancy Eaton  FMW:[id number] DOB: [birth year] DOA: 08/05/2024 PCP: Christopher Arland PARAS, PA-C   Brief Narrative:  Nancy Eaton is a 68 y.o. female with medical history significant for prediabetes, ulcerative colitis, hypertension, hyperlipidemia recently discharged from [facility] for ulcerative colitis flare on 8/29 now returns with dehydration, abdominal cramping and vomiting.  Found to have another ulcerative colitis flare and initially there was concern that her symptoms were related to acyclovir  but she has active herpes zoster so she will be placed back on IV acyclovir  as it is more of an intolerance than allergy.  This hospitalization she received a dose of Inflectra  and Rinvoq  has been discontinued and her Steroids have been changed from IV to po. Anticipating D/Cing in the next 24-48 hours if tolerating Soft Diet without issues.   Assessment and Plan:  Nausea and vomiting in the setting of ulcerative colitis: Improving. Now having abdominal discomfort, loose stools.  Initially thought to be in setting of valacyclovir but she continues to have a mild leukocytosis but patient is on steroids, no focal abdominal tenderness, no evidence of acute infection. WBC Trend:  Recent Labs  Lab 07/17/24 0546 07/18/24 0516 07/19/24 0533 08/05/24 0956 08/06/24 0413 08/07/24 1052 08/08/24 0415  WBC 16.9* 10.3 10.9* 12.0* 12.7* 14.3* 11.6*  -Was on Soft Diet but having Sx so will cut down to FULL and her continued abdominal pain and loose stools -Supportive care, Resumed IV Acyclovir  as below   Hyponatremia: Given the patient's recent vomiting, and evidence of dehydration on exam, this is likely a hypovolemic hyponatremia. -Hydrating w/ with normal saline at 100 mL/hr ; Replete w/ IV NaPhos 30 mmol yesterday -Na+ Trend:  Recent Labs  Lab 07/18/24 0516 07/19/24 0533 08/05/24 0956 08/05/24 1649 08/06/24 0413 08/07/24 1052 08/08/24 0415  NA 133* 134* 128* 128* 128* 130* 136   -CTM and Trend and repeat CMP in the AM   Hypophosphatemia: Phos Level is now 1.3. Replete w/ IV Sodium Phos 30 mmol. CTM and Replete as Necessary. Repeat Phos Level in the AM  Buttock Rash in the setting of Active Herpes Zoster: Consult WOC nurse; Rash is Vesicular  ID consulted and Transferred her to Negative Pressure Room w/ Airborne and Contact Precautions. Getting IV Acyclovir  now. ID checking Varicella Zoster via PCR and including HSV PCR   Ulcerative colitis-admitted to the hospital with recent flare, improved with IV steroids and initiation of Rinvoq .  GI Dr. Rollin at the time of admission, who did not feel that imaging was necessary at this point in time. -Steroids change from IV to po and will Continue Prednisone  40 mg p.o. daily -Rinvoq  changed to Infliximab  by GI as she receives this 10 mg/kg infusion at 10 AM on 9/16 with improvement in her symptoms.  She was approved for 10 mg/kg infliximab  as an outpatient plan is to place her on this medication every 4 weeks per GI and they are recommending a repeat trough and antibody level to be obtained.  She is also amenable to start AZA at this time and will be initiated on this in the outpatient setting -Still having some abdominal discomfort and loose stools so we cut back her diet but she's improving and now will go back to SOFT Diet.    Essential Hypertension: Cozaar  was paused during last admission, blood pressure is currently normal so we will continue to hold this for the time being. CTM BP per Protocol. Last BP reading was 135/73   Type  2 Diabetes: most recent hemoglobin A1c 6.7, diet controlled -Moderate dose sliding scale. CTM CBGs per Protocol. CBG Trend:  Recent Labs  Lab 08/07/24 2050 08/07/24 2358 08/08/24 0409 08/08/24 0642 08/08/24 0741 08/08/24 1138 08/08/24 1534  GLUCAP 226* 136* 69* 70 116* 172* 296*    Chronic Iron  Deficiency Anemia: Hgb/Hct Trend was dropping slightly but now fairly stable:  Recent Labs  Lab  07/17/24 0546 07/18/24 0516 07/19/24 0533 08/05/24 0956 08/06/24 0413 08/07/24 1052 08/08/24 0415  HGB 9.5* 9.2* 9.2* 9.2* 8.8* 8.7* 8.7*  HCT 30.0* 29.1* 30.6* 29.7* 30.0* 28.9* 27.7*  MCV 82.0 82.7 83.2 82.5 85.0 83.0 83.4  -Check Anemia Panel in the AM; CTM Monitor for S/Sx of Bleeding; No overt bleeding noted. Repeat CBC in the AM   Chronic Thrombocytosis: Stable. Plt Count trend:  Recent Labs  Lab 07/17/24 0546 07/18/24 0516 07/19/24 0533 08/05/24 0956 08/06/24 0413 08/07/24 1052 08/08/24 0415  PLT 458* 431* 447* 534* 535* 479* 462*  CTM and Trend and repeat CMP in the AM   GERD/GI Prophylaxis: C/w Pantoprazole  40 mg po Daily   Hypoalbuminemia: Patient's Albumin Lvl ranging from 2.3 -> 3.1 (2.7 today). CTM and Trend and repeat CMP in the AM  Overweight: Complicates overall prognosis and care. Estimated body mass index is 29.8 kg/m as calculated from the following:   Height as of this encounter: 5' 2.5 (1.588 m).   Weight as of this encounter: 75.1 kg. Weight Loss and Dietary Counseling given   DVT prophylaxis: enoxaparin  (LOVENOX ) injection 40 mg Start: 08/05/24 2200    Code Status: Full Code Family Communication: No family present @ bedside  Disposition Plan:  Level of care: Med-Surg Status is: Inpatient Remains inpatient appropriate because: Needs further GI clearance and infectious disease clearance   Consultants:  Gastroenterology Infectious Diseases  Procedures:  As delineated as above  Antimicrobials:  Anti-infectives (From admission, onward)    Start     Dose/Rate Route Frequency Ordered Stop   08/07/24 1400  acyclovir  (ZOVIRAX ) 750 mg in dextrose  5 % 250 mL IVPB        10 mg/kg  75.1 kg 265 mL/hr over 60 Minutes Intravenous Every 8 hours 08/07/24 1141     08/07/24 1000  acyclovir  (ZOVIRAX ) tablet 800 mg  Status:  Discontinued        800 mg Oral 5 times daily 08/07/24 0831 08/07/24 0839       Subjective: Seen and examined at bedside.   Having some cramping but feels little bit better and wanting to try soft diet.  States that she has had some nausea but is manageable.  No other concerns or complaints at this time.  Objective: Vitals:   08/07/24 1533 08/07/24 2044 08/08/24 0421 08/08/24 1326  BP: 134/74 130/70 130/77 135/73  Pulse: 84 79 72 91  Resp: 16 16 16 18   Temp: 98.1 F (36.7 C) 97.9 F (36.6 C) 98.4 F (36.9 C) 98.7 F (37.1 C)  TempSrc: Oral Oral Oral Oral  SpO2: 98% 98% 97% 95%  Weight:      Height:        Intake/Output Summary (Last 24 hours) at 08/08/2024 1722 Last data filed at 08/08/2024 1616 Gross per 24 hour  Intake 4014.08 ml  Output 0 ml  Net 4014.08 ml   Filed Weights   08/06/24 0857  Weight: 75.1 kg   Examination: Physical Exam:  Constitutional: WN/WD overweight Caucasian female no acute distress appears a bit more comfortable today compared to yesterday Respiratory:  Diminished to auscultation bilaterally, no wheezing, rales, rhonchi or crackles. Normal respiratory effort and patient is not tachypenic. No accessory muscle use.  Unlabored breathing Cardiovascular: RRR, no murmurs / rubs / gallops. S1 and S2 auscultated. No extremity edema. Abdomen: Soft, a little-tender, distended secondary to body habitus. Bowel sounds positive.  GU: Deferred. Musculoskeletal: No clubbing / cyanosis of digits/nails. No joint deformity upper and lower extremities.  Neurologic: CN 2-12 grossly intact with no focal deficits. Romberg sign and cerebellar reflexes not assessed.  Psychiatric: Normal judgment and insight. Alert and oriented x 3. Normal mood and appropriate affect.   Data Reviewed: I have personally reviewed following labs and imaging studies  CBC: Recent Labs  Lab 08/05/24 0956 08/06/24 0413 08/07/24 1052 08/08/24 0415  WBC 12.0* 12.7* 14.3* 11.6*  NEUTROABS 8.0*  --  10.7* 6.7  HGB 9.2* 8.8* 8.7* 8.7*  HCT 29.7* 30.0* 28.9* 27.7*  MCV 82.5 85.0 83.0 83.4  PLT 534* 535* 479* 462*    Basic Metabolic Panel: Recent Labs  Lab 08/05/24 0956 08/05/24 1649 08/06/24 0413 08/07/24 1052 08/08/24 0415  NA 128* 128* 128* 130* 136  K 4.1 4.5 4.0 4.3 4.0  CL 93* 94* 95* 96* 100  CO2 26 23 21* 22 24  GLUCOSE 93 101* 89 154* 69*  BUN 15 13 14 9  6*  CREATININE 0.65 0.57 0.53 0.61 0.46  CALCIUM  9.0 8.6* 8.4* 8.4* 8.4*  MG  --   --   --  2.4 2.5*  PHOS  --   --   --  1.3* 3.8   GFR: Estimated Creatinine Clearance: 63.7 mL/min (by C-G formula based on SCr of 0.46 mg/dL). Liver Function Tests: Recent Labs  Lab 08/05/24 0956 08/07/24 1052 08/08/24 0415  AST 13* 14* <10*  ALT 14 15 15   ALKPHOS 73 79 71  BILITOT 0.3 0.2 0.3  PROT 5.7* 5.4* 5.1*  ALBUMIN 3.1* 2.9* 2.7*   Recent Labs  Lab 08/05/24 0956  LIPASE 19   No results for input(s): AMMONIA in the last 168 hours. Coagulation Profile: No results for input(s): INR, PROTIME in the last 168 hours. Cardiac Enzymes: No results for input(s): CKTOTAL, CKMB, CKMBINDEX, TROPONINI in the last 168 hours. BNP (last 3 results) No results for input(s): PROBNP in the last 8760 hours. HbA1C: No results for input(s): HGBA1C in the last 72 hours. CBG: Recent Labs  Lab 08/08/24 0409 08/08/24 0642 08/08/24 0741 08/08/24 1138 08/08/24 1534  GLUCAP 69* 70 116* 172* 296*   Lipid Profile: No results for input(s): CHOL, HDL, LDLCALC, TRIG, CHOLHDL, LDLDIRECT in the last 72 hours. Thyroid Function Tests: No results for input(s): TSH, T4TOTAL, FREET4, T3FREE, THYROIDAB in the last 72 hours. Anemia Panel: No results for input(s): VITAMINB12, FOLATE, FERRITIN, TIBC, IRON , RETICCTPCT in the last 72 hours. Sepsis Labs: No results for input(s): PROCALCITON, LATICACIDVEN in the last 168 hours.  No results found for this or any previous visit (from the past 240 hours).   Radiology Studies: No results found.  Scheduled Meds:  atorvastatin   40 mg Oral QHS   enoxaparin   (LOVENOX ) injection  40 mg Subcutaneous Q24H   feeding supplement (GLUCERNA SHAKE)  237 mL Oral TID BM   ferrous sulfate   325 mg Oral QODAY   insulin  aspart  0-15 Units Subcutaneous Q4H   pantoprazole   40 mg Oral Daily   predniSONE   40 mg Oral Q breakfast   Continuous Infusions:  sodium chloride  100 mL/hr at 08/08/24 1254   acyclovir  750 mg (  08/08/24 1359)    LOS: 3 days   Alejandro Marker, DO [facility] Available via Epic secure chat 7am-7pm After these hours, please refer to coverage provider listed on [email or web address] 08/08/2024, 5:22 PM

## 2024-08-08 NOTE — Progress Notes (Signed)
 Subjective: Intermittent lower abdominal cramping.  Objective: Vital signs in last 24 hours: Temp:  [97.9 F (36.6 C)-98.5 F (36.9 C)] 98.4 F (36.9 C) (09/18 0421) Pulse Rate:  [72-84] 72 (09/18 0421) Resp:  [16] 16 (09/18 0421) BP: (130-134)/(63-77) 130/77 (09/18 0421) SpO2:  [94 %-98 %] 97 % (09/18 0421) Last BM Date : 08/05/24  Intake/Output from previous day: 09/17 0701 - 09/18 0700 In: 4002.8 [P.O.:1750; I.V.:1462.8; IV Piggyback:790] Out: 0  Intake/Output this shift: No intake/output data recorded.  General appearance: alert and no distress GI: some lower abdominal tenderness Skin: some crusting with the right gluteal lesion, ulcerated in the gluteal cleft  Lab Results: Recent Labs    08/06/24 0413 08/07/24 1052 08/08/24 0415  WBC 12.7* 14.3* 11.6*  HGB 8.8* 8.7* 8.7*  HCT 30.0* 28.9* 27.7*  PLT 535* 479* 462*   BMET Recent Labs    08/06/24 0413 08/07/24 1052 08/08/24 0415  NA 128* 130* 136  K 4.0 4.3 4.0  CL 95* 96* 100  CO2 21* 22 24  GLUCOSE 89 154* 69*  BUN 14 9 6*  CREATININE 0.53 0.61 0.46  CALCIUM  8.4* 8.4* 8.4*   LFT Recent Labs    08/08/24 0415  PROT 5.1*  ALBUMIN 2.7*  AST <10*  ALT 15  ALKPHOS 71  BILITOT 0.3   PT/INR No results for input(s): LABPROT, INR in the last 72 hours. Hepatitis Panel No results for input(s): HEPBSAG, HCVAB, HEPAIGM, HEPBIGM in the last 72 hours. C-Diff No results for input(s): CDIFFTOX in the last 72 hours. Fecal Lactopherrin No results for input(s): FECLLACTOFRN in the last 72 hours.  Studies/Results: No results found.  Medications: Scheduled:  atorvastatin   40 mg Oral QHS   enoxaparin  (LOVENOX ) injection  40 mg Subcutaneous Q24H   feeding supplement (GLUCERNA SHAKE)  237 mL Oral TID BM   ferrous sulfate   325 mg Oral QODAY   insulin  aspart  0-15 Units Subcutaneous Q4H   pantoprazole   40 mg Oral Daily   predniSONE   40 mg Oral Q breakfast   Continuous:  sodium chloride  100  mL/hr at 08/07/24 2038   acyclovir  750 mg (08/08/24 0640)    Assessment/Plan: 1) Severe left-sided UC. 2) Gluteal cleft ulceration - ? Zoster.  She is on acyclovir .   Clinically she is stable.  She thinks that she is improving.  The gluteal cleft ulceration appears stable.  The varicella PCR  and HSV are pending.  Plan: 1) Maintain prednisone  40 mg every day. 2) Continue with acyclovir . 3) Continue with Lovenox . 4) Pending her clinical progress, a surgical consultation may be required for consideration of colectomy.  LOS: 3 days   Nancy Eaton D 08/08/2024, 7:24 AM

## 2024-08-08 NOTE — Plan of Care (Signed)

## 2024-08-09 DIAGNOSIS — B028 Zoster with other complications: Secondary | ICD-10-CM | POA: Diagnosis not present

## 2024-08-09 DIAGNOSIS — K5190 Ulcerative colitis, unspecified, without complications: Secondary | ICD-10-CM | POA: Diagnosis not present

## 2024-08-09 DIAGNOSIS — R1110 Vomiting, unspecified: Secondary | ICD-10-CM | POA: Diagnosis not present

## 2024-08-09 DIAGNOSIS — R21 Rash and other nonspecific skin eruption: Secondary | ICD-10-CM | POA: Diagnosis not present

## 2024-08-09 DIAGNOSIS — K51919 Ulcerative colitis, unspecified with unspecified complications: Secondary | ICD-10-CM | POA: Diagnosis not present

## 2024-08-09 LAB — CBC WITH DIFFERENTIAL/PLATELET
Abs Immature Granulocytes: 0.51 K/uL — ABNORMAL HIGH (ref 0.00–0.07)
Basophils Absolute: 0 K/uL (ref 0.0–0.1)
Basophils Relative: 0 %
Eosinophils Absolute: 0 K/uL (ref 0.0–0.5)
Eosinophils Relative: 0 %
HCT: 25.2 % — ABNORMAL LOW (ref 36.0–46.0)
Hemoglobin: 7.7 g/dL — ABNORMAL LOW (ref 12.0–15.0)
Immature Granulocytes: 5 %
Lymphocytes Relative: 20 %
Lymphs Abs: 2.1 K/uL (ref 0.7–4.0)
MCH: 25.4 pg — ABNORMAL LOW (ref 26.0–34.0)
MCHC: 30.6 g/dL (ref 30.0–36.0)
MCV: 83.2 fL (ref 80.0–100.0)
Monocytes Absolute: 1.5 K/uL — ABNORMAL HIGH (ref 0.1–1.0)
Monocytes Relative: 14 %
Neutro Abs: 6.4 K/uL (ref 1.7–7.7)
Neutrophils Relative %: 61 %
Platelets: 434 K/uL — ABNORMAL HIGH (ref 150–400)
RBC: 3.03 MIL/uL — ABNORMAL LOW (ref 3.87–5.11)
RDW: 16.9 % — ABNORMAL HIGH (ref 11.5–15.5)
WBC: 10.5 K/uL (ref 4.0–10.5)
nRBC: 0 % (ref 0.0–0.2)

## 2024-08-09 LAB — GLUCOSE, CAPILLARY
Glucose-Capillary: 115 mg/dL — ABNORMAL HIGH (ref 70–99)
Glucose-Capillary: 206 mg/dL — ABNORMAL HIGH (ref 70–99)
Glucose-Capillary: 218 mg/dL — ABNORMAL HIGH (ref 70–99)
Glucose-Capillary: 250 mg/dL — ABNORMAL HIGH (ref 70–99)
Glucose-Capillary: 94 mg/dL (ref 70–99)
Glucose-Capillary: 98 mg/dL (ref 70–99)

## 2024-08-09 LAB — COMPREHENSIVE METABOLIC PANEL WITH GFR
ALT: 15 U/L (ref 0–44)
AST: <10 U/L — ABNORMAL LOW (ref 15–41)
Albumin: 2.6 g/dL — ABNORMAL LOW (ref 3.5–5.0)
Alkaline Phosphatase: 74 U/L (ref 38–126)
Anion gap: 11 (ref 5–15)
BUN: 9 mg/dL (ref 8–23)
CO2: 25 mmol/L (ref 22–32)
Calcium: 8.4 mg/dL — ABNORMAL LOW (ref 8.9–10.3)
Chloride: 100 mmol/L (ref 98–111)
Creatinine, Ser: 0.49 mg/dL (ref 0.44–1.00)
GFR, Estimated: >60 mL/min (ref >60)
Glucose, Bld: 109 mg/dL — ABNORMAL HIGH (ref 70–99)
Potassium: 4.1 mmol/L (ref 3.5–5.1)
Sodium: 136 mmol/L (ref 135–145)
Total Bilirubin: <0.2 mg/dL (ref 0.0–1.2)
Total Protein: 4.9 g/dL — ABNORMAL LOW (ref 6.5–8.1)

## 2024-08-09 LAB — MAGNESIUM: Magnesium: 2.5 mg/dL — ABNORMAL HIGH (ref 1.7–2.4)

## 2024-08-09 LAB — PHOSPHORUS: Phosphorus: 2.4 mg/dL — ABNORMAL LOW (ref 2.5–4.6)

## 2024-08-09 MED ORDER — K PHOS MONO-SOD PHOS DI & MONO 155-852-130 MG PO TABS
500.0000 mg | ORAL_TABLET | Freq: Once | ORAL | Status: AC
  Administered 2024-08-09: 500 mg via ORAL
  Filled 2024-08-09: qty 2

## 2024-08-09 NOTE — Progress Notes (Signed)
 PROGRESS NOTE    Nancy Eaton  FMW:[id number] DOB: Mar 26, 1956 DOA: 08/05/2024 PCP: Christopher Arland PARAS, PA-C   Brief Narrative:  Nancy Eaton is a 67 y.o. female with medical history significant for prediabetes, ulcerative colitis, hypertension, hyperlipidemia recently discharged from [facility] for ulcerative colitis flare on 8/29 now returns with dehydration, abdominal cramping and vomiting.  Found to have another ulcerative colitis flare and initially there was concern that her symptoms were related to acyclovir  but she has active herpes zoster so she will be placed back on IV acyclovir  as it is more of an intolerance than allergy.  This hospitalization she received a dose of Inflectra  and Rinvoq  has been discontinued and her Steroids have been changed from IV to po.  She is tolerating soft diet but ID is recommending continue IV acyclovir  for now monitoring her creatinine  Assessment and Plan:  Nausea and vomiting in the setting of ulcerative colitis: Improving. Now having abdominal discomfort, loose stools.  Initially thought to be in setting of valacyclovir but she continues to have a mild leukocytosis but patient is on steroids, no focal abdominal tenderness, no evidence of acute infection. WBC Trend:  Recent Labs  Lab 07/18/24 0516 07/19/24 0533 08/05/24 0956 08/06/24 0413 08/07/24 1052 08/08/24 0415 08/09/24 0345  WBC 10.3 10.9* 12.0* 12.7* 14.3* 11.6* 10.5  -Was on Soft Diet but having Sx so will cut down to FULL and her continued abdominal pain and loose stools -Supportive care, Resumed IV Acyclovir  as below and ID recommends continuing pending the varicella zoster and HSV PCR pending   Hyponatremia: Given the patient's recent vomiting, and evidence of dehydration on exam, this is likely a hypovolemic hyponatremia. -Hydrating w/ with normal saline at 100 mL/hr continuously; -Na+ Trend:  Recent Labs  Lab 07/19/24 0533 08/05/24 0956 08/05/24 1649 08/06/24 0413  08/07/24 1052 08/08/24 0415 08/09/24 0345  NA 134* 128* 128* 128* 130* 136 136  -CTM and Trend and repeat CMP in the AM   Hypophosphatemia: Mild. Phos Level is now 2.4. Replete w/ po K Phos  Neutral 500 mg x1. CTM and Replete as Necessary. Repeat Phos Level in the AM  Buttock Rash in the setting of Active Herpes Zoster: Consult WOC nurse; Rash is Vesicular  ID consulted and Transferred her to Negative Pressure Room w/ Airborne and Contact Precautions. Getting IV Acyclovir  now and will be continued. ID checking Varicella Zoster via PCR and including HSV PCR and this is still pending; ID is tentatively planning to switch to p.o. valacyclovir for discharge   Ulcerative colitis-admitted to the hospital with recent flare, improved with IV steroids and initiation of Rinvoq .  GI Dr. Rollin at the time of admission, who did not feel that imaging was necessary at this point in time. -Steroids change from IV to po and will Continue Prednisone  40 mg p.o. daily -Rinvoq  changed to Infliximab  by GI as she receives this 10 mg/kg infusion at 10 AM on 9/16 with improvement in her symptoms.  She was approved for 10 mg/kg infliximab  as an outpatient plan is to place her on this medication every 4 weeks per GI and they are recommending a repeat trough and antibody level to be obtained.  She is also amenable to start AZA at this time and will be initiated on this in the outpatient setting -Abdominal discomfort and loose stools improving and now tolerating SOFT Diet.    Essential Hypertension: Cozaar  was paused during last admission, blood pressure is currently normal so we will continue to  hold this for the time being. CTM BP per Protocol. Last BP reading was 130/78   Type 2 Diabetes: most recent hemoglobin A1c 6.7, diet controlled -Moderate dose sliding scale. CTM CBGs per Protocol. CBG Trend:  Recent Labs  Lab 08/08/24 1138 08/08/24 1534 08/08/24 2027 08/09/24 0018 08/09/24 0410 08/09/24 0748 08/09/24 1149   GLUCAP 172* 296* 186* 218* 94 98 115*    Chronic Iron  Deficiency Anemia: Hgb/Hct Trend was dropping slightly but now fairly stable:  Recent Labs  Lab 07/18/24 0516 07/19/24 0533 08/05/24 0956 08/06/24 0413 08/07/24 1052 08/08/24 0415 08/09/24 0345  HGB 9.2* 9.2* 9.2* 8.8* 8.7* 8.7* 7.7*  HCT 29.1* 30.6* 29.7* 30.0* 28.9* 27.7* 25.2*  MCV 82.7 83.2 82.5 85.0 83.0 83.4 83.2  -Check Anemia Panel in the AM; CTM Monitor for S/Sx of Bleeding; No overt bleeding noted. Repeat CBC in the AM   Chronic Thrombocytosis: Stable. Plt Count trend:  Recent Labs  Lab 07/18/24 0516 07/19/24 0533 08/05/24 0956 08/06/24 0413 08/07/24 1052 08/08/24 0415 08/09/24 0345  PLT 431* 447* 534* 535* 479* 462* 434*  CTM and Trend and repeat CMP in the AM   GERD/GI Prophylaxis: C/w Pantoprazole  40 mg po Daily   Hypoalbuminemia: Patient's Albumin Lvl ranging from 2.3 -> 3.1 (2.6 today). CTM and Trend and repeat CMP in the AM  Overweight: Complicates overall prognosis and care. Estimated body mass index is 29.8 kg/m as calculated from the following:   Height as of this encounter: 5' 2.5 (1.588 m).   Weight as of this encounter: 75.1 kg. Weight Loss and Dietary Counseling given   DVT prophylaxis: enoxaparin  (LOVENOX ) injection 40 mg Start: 08/05/24 2200    Code Status: Full Code Family Communication: No family currently at bedside  Disposition Plan:  Level of care: Med-Surg Status is: Inpatient Remains inpatient appropriate because: Needs further clinical improvement and clearance by the specialist and ID recommends continue IV acyclovir  pending the VZV and HSV PCR   Consultants:  Infectious Diseases Gastroenterology  Procedures:  As delineated as above  Antimicrobials:  Anti-infectives (From admission, onward)    Start     Dose/Rate Route Frequency Ordered Stop   08/07/24 1400  acyclovir  (ZOVIRAX ) 750 mg in dextrose  5 % 250 mL IVPB        10 mg/kg  75.1 kg 265 mL/hr over 60 Minutes  Intravenous Every 8 hours 08/07/24 1141     08/07/24 1000  acyclovir  (ZOVIRAX ) tablet 800 mg  Status:  Discontinued        800 mg Oral 5 times daily 08/07/24 0831 08/07/24 0839       Subjective: Seen and examined at bedside and states that she did not sleep very well last night and was resting and want to sleep now.  Thinks her abdominal discomfort is improving and loose stools are becoming more formed.  Denies any lightheadedness or dizziness but she continues to have very mild burning in the buttock rash.  Objective: Vitals:   08/08/24 2030 08/08/24 2110 08/09/24 0544 08/09/24 1417  BP: 127/84  134/73 130/78  Pulse: 84  67 86  Resp: (!) 22 17 18 16   Temp: 98.5 F (36.9 C)  98.2 F (36.8 C) 98 F (36.7 C)  TempSrc: Oral  Oral Oral  SpO2: 96%  95% 97%  Weight:      Height:        Intake/Output Summary (Last 24 hours) at 08/09/2024 1620 Last data filed at 08/09/2024 0610 Gross per 24 hour  Intake 1332  ml  Output --  Net 1332 ml   Filed Weights   08/06/24 0857  Weight: 75.1 kg   Examination: Physical Exam:  Constitutional: WN/WD, overweight Caucasian female in no acute distress appears comfortable and wanting to rest Respiratory: Diminished to auscultation bilaterally, no wheezing, rales, rhonchi or crackles. Normal respiratory effort and patient is not tachypenic. No accessory muscle use.  Unlabored breathing Cardiovascular: RRR, no murmurs / rubs / gallops. S1 and S2 auscultated. No extremity edema.  Abdomen: Soft, not as tender, distended secondary to body habitus. Bowel sounds positive.  GU: Deferred. Musculoskeletal: No clubbing / cyanosis of digits/nails. No joint deformity upper and lower extremities.  Neurologic: CN 2-12 grossly intact with no focal deficits. Romberg sign and cerebellar reflexes not assessed.  Psychiatric: Normal judgment and insight. Alert and oriented x 3. Normal mood and appropriate affect.   Data Reviewed: I have personally reviewed following  labs and imaging studies  CBC: Recent Labs  Lab 08/05/24 0956 08/06/24 0413 08/07/24 1052 08/08/24 0415 08/09/24 0345  WBC 12.0* 12.7* 14.3* 11.6* 10.5  NEUTROABS 8.0*  --  10.7* 6.7 6.4  HGB 9.2* 8.8* 8.7* 8.7* 7.7*  HCT 29.7* 30.0* 28.9* 27.7* 25.2*  MCV 82.5 85.0 83.0 83.4 83.2  PLT 534* 535* 479* 462* 434*   Basic Metabolic Panel: Recent Labs  Lab 08/05/24 1649 08/06/24 0413 08/07/24 1052 08/08/24 0415 08/09/24 0345  NA 128* 128* 130* 136 136  K 4.5 4.0 4.3 4.0 4.1  CL 94* 95* 96* 100 100  CO2 23 21* 22 24 25   GLUCOSE 101* 89 154* 69* 109*  BUN 13 14 9  6* 9  CREATININE 0.57 0.53 0.61 0.46 0.49  CALCIUM  8.6* 8.4* 8.4* 8.4* 8.4*  MG  --   --  2.4 2.5* 2.5*  PHOS  --   --  1.3* 3.8 2.4*   GFR: Estimated Creatinine Clearance: 63.7 mL/min (by C-G formula based on SCr of 0.49 mg/dL). Liver Function Tests: Recent Labs  Lab 08/05/24 0956 08/07/24 1052 08/08/24 0415 08/09/24 0345  AST 13* 14* <10* <10*  ALT 14 15 15 15   ALKPHOS 73 79 71 74  BILITOT 0.3 0.2 0.3 <0.2  PROT 5.7* 5.4* 5.1* 4.9*  ALBUMIN 3.1* 2.9* 2.7* 2.6*   Recent Labs  Lab 08/05/24 0956  LIPASE 19   No results for input(s): AMMONIA in the last 168 hours. Coagulation Profile: No results for input(s): INR, PROTIME in the last 168 hours. Cardiac Enzymes: No results for input(s): CKTOTAL, CKMB, CKMBINDEX, TROPONINI in the last 168 hours. BNP (last 3 results) No results for input(s): PROBNP in the last 8760 hours. HbA1C: No results for input(s): HGBA1C in the last 72 hours. CBG: Recent Labs  Lab 08/08/24 2027 08/09/24 0018 08/09/24 0410 08/09/24 0748 08/09/24 1149  GLUCAP 186* 218* 94 98 115*   Lipid Profile: No results for input(s): CHOL, HDL, LDLCALC, TRIG, CHOLHDL, LDLDIRECT in the last 72 hours. Thyroid Function Tests: No results for input(s): TSH, T4TOTAL, FREET4, T3FREE, THYROIDAB in the last 72 hours. Anemia Panel: No results for  input(s): VITAMINB12, FOLATE, FERRITIN, TIBC, IRON , RETICCTPCT in the last 72 hours. Sepsis Labs: No results for input(s): PROCALCITON, LATICACIDVEN in the last 168 hours.  No results found for this or any previous visit (from the past 240 hours).   Radiology Studies: No results found.  Scheduled Meds:  atorvastatin   40 mg Oral QHS   enoxaparin  (LOVENOX ) injection  40 mg Subcutaneous Q24H   feeding supplement (GLUCERNA SHAKE)  237  mL Oral TID BM   ferrous sulfate   325 mg Oral QODAY   insulin  aspart  0-15 Units Subcutaneous Q4H   pantoprazole   40 mg Oral Daily   phosphorus  500 mg Oral Once   predniSONE   40 mg Oral Q breakfast   Continuous Infusions:  sodium chloride  100 mL/hr at 08/09/24 0329   acyclovir  750 mg (08/09/24 1431)    LOS: 4 days   Alejandro Marker, DO [facility] Available via Epic secure chat 7am-7pm After these hours, please refer to coverage provider listed on [email or web address] 08/09/2024, 4:20 PM

## 2024-08-09 NOTE — TOC Progression Note (Signed)
 Transition of Care [facility]) - Progression Note    Patient Details  Name: Nancy Eaton MRN: [id number] Date of Birth: 1956-04-02  Transition of Care [facility]) CM/SW Contact  Sonda Manuella Quill, RN Phone Number: 08/09/2024, 2:07 PM  Clinical Narrative:    Not ready for d/c; pt on IV acyclovir ; pt followed by ID; TOC is following   Expected Discharge Plan: Home/Self Care Barriers to Discharge: No Barriers Identified               Expected Discharge Plan and Services In-house Referral: NA Discharge Planning Services: CM Consult   Living arrangements for the past 2 months: Single Family Home                 DME Arranged: N/A DME Agency: NA       HH Arranged: NA HH Agency: NA         Social Drivers of Health (SDOH) Interventions SDOH Screenings   Food Insecurity: No Food Insecurity (08/05/2024)  Housing: Low Risk  (08/05/2024)  Transportation Needs: No Transportation Needs (08/05/2024)  Utilities: Not At Risk (08/05/2024)  Social Connections: Unknown (08/05/2024)  Tobacco Use: Low Risk  (08/05/2024)    Readmission Risk Interventions    08/06/2024    3:36 PM 07/16/2024    1:58 PM  Readmission Risk Prevention Plan  Transportation Screening Complete Complete  HRI or Home Care Consult  Complete  Social Work Consult for Recovery Care Planning/Counseling  Complete  Palliative Care Screening  Not Applicable  Medication Review Oceanographer) Complete Complete  PCP or Specialist appointment within 3-5 days of discharge Complete   HRI or Home Care Consult Complete   SW Recovery Care/Counseling Consult Complete   Palliative Care Screening Not Applicable   Skilled Nursing Facility Not Applicable

## 2024-08-09 NOTE — Progress Notes (Signed)
 Subjective: Feeling well.  Her abdominal pain is much less and she is having good bowel movements.  Objective: Vital signs in last 24 hours: Temp:  [98.2 F (36.8 C)-98.7 F (37.1 C)] 98.2 F (36.8 C) (09/19 0544) Pulse Rate:  [67-91] 67 (09/19 0544) Resp:  [17-22] 18 (09/19 0544) BP: (127-135)/(73-84) 134/73 (09/19 0544) SpO2:  [95 %-96 %] 95 % (09/19 0544) Last BM Date : 08/08/24  Intake/Output from previous day: 09/18 0701 - 09/19 0700 In: 3582 [P.O.:1040; I.V.:2012; IV Piggyback:530] Out: -  Intake/Output this shift: No intake/output data recorded.  General appearance: alert and no distress GI: soft, non-tender; bowel sounds normal; no masses,  no organomegaly  Lab Results: Recent Labs    08/07/24 1052 08/08/24 0415 08/09/24 0345  WBC 14.3* 11.6* 10.5  HGB 8.7* 8.7* 7.7*  HCT 28.9* 27.7* 25.2*  PLT 479* 462* 434*   BMET Recent Labs    08/07/24 1052 08/08/24 0415 08/09/24 0345  NA 130* 136 136  K 4.3 4.0 4.1  CL 96* 100 100  CO2 22 24 25   GLUCOSE 154* 69* 109*  BUN 9 6* 9  CREATININE 0.61 0.46 0.49  CALCIUM  8.4* 8.4* 8.4*   LFT Recent Labs    08/09/24 0345  PROT 4.9*  ALBUMIN 2.6*  AST <10*  ALT 15  ALKPHOS 74  BILITOT <0.2   PT/INR No results for input(s): LABPROT, INR in the last 72 hours. Hepatitis Panel No results for input(s): HEPBSAG, HCVAB, HEPAIGM, HEPBIGM in the last 72 hours. C-Diff No results for input(s): CDIFFTOX in the last 72 hours. Fecal Lactopherrin No results for input(s): FECLLACTOFRN in the last 72 hours.  Studies/Results: No results found.  Medications: Scheduled:  atorvastatin   40 mg Oral QHS   enoxaparin  (LOVENOX ) injection  40 mg Subcutaneous Q24H   feeding supplement (GLUCERNA SHAKE)  237 mL Oral TID BM   ferrous sulfate   325 mg Oral QODAY   insulin  aspart  0-15 Units Subcutaneous Q4H   pantoprazole   40 mg Oral Daily   predniSONE   40 mg Oral Q breakfast   Continuous:  sodium chloride  100  mL/hr at 08/09/24 0329   acyclovir  750 mg (08/09/24 0610)    Assessment/Plan: 1) Severe left sided UC s/p 10 mg/kg. 2) Bilateral Zoster in the gluteal cleft.   The patient is much improved.  Her abdominal pain is not severe at this time.  From the GI standpoint she can be discharged home.  Plan: 1) Maintain 40 mg of prednisone  every day. 2) Follow up in the office this coming Wednesday.  I will make the follow up arrangements. 3) Acyclovir  per ID. 4) Signing off.   LOS: 4 days   Kymora Sciara D 08/09/2024, 7:10 AM

## 2024-08-09 NOTE — Progress Notes (Signed)
 [facility] Infectious Diseases Follow Up Note  Patient Identification: Patient Name: Nancy Eaton MRN: [id number] Admit Date: 08/05/2024  8:11 AM Age: 68 y.o.Today's Date: 08/09/2024  Reason for Visit: Buttock rash  Principal Problem:   Intractable vomiting  Antibiotics None    Lines/Hardware: RT TKA  Interval Events: Remains afebrile Labs remarkable for albumin 2.6, AST less than 10.  WBC 10.5, hemoglobin 7.7  Assessment 68 year old female with prior history of prediabetes, HTN, HLD, UC, recent discharged on 8/29 after hospitalization significant for acute ulcerative colitis flare who presented to the ED on 9/15 with abdominal pain, nausea and vomiting after starting Valacyclovir on 9/12 for buttock rash.   # Rash in the b/l buttock , concerns for at least 2 dermatomal, b/l involvement in an immunocompromised host  - No concerns for CNS or eye/ear involvement - stable/not worsening    # UC  - on prednisone  40 mg daily - s/p infliximab  per GI on 9/16 - Follow-up with GI   Recommendations - continue IV acyclovir  pending VZV and HSV PCR.  Monitor creatinine - Tentative plan to switch to PO valacyclovir for discharge.  Discussed with prior nausea/vomiting attributed to valacyclovir is not a true allergy and could be related to her ongoing GI pathology and can be attempted again as a preferred option. She is agreeable.  - Monitor CBC and BMP - Maintain airborne and contact  Following peripherally over the weekend.  Rest of the management as per the primary team. Thank you for the consult. Please page with pertinent questions or concerns.  ______________________________________________________________________ Subjective patient seen and examined at the bedside.  Very mild burning in the buttock rash.  Abdominal cramping, good bowel movements  Vitals BP 134/73 (BP Location: Left Arm)   Pulse 67   Temp 98.2 F (36.8  C) (Oral)   Resp 18   Ht 5' 2.5 (1.588 m)   Wt 75.1 kg   SpO2 95%   BMI 29.80 kg/m      Physical Exam Constitutional: Adult female lying in the bed    Comments: HEENT WNL  Cardiovascular:     Rate and Rhythm: Normal rate and regular rhythm.     Heart sounds:   Pulmonary:     Effort: Pulmonary effort is normal.     Comments:   Abdominal:     Palpations: Abdomen is nondistended    Tenderness:   Musculoskeletal:        General: No swelling or tenderness in peripheral joints  Skin:    Comments: No rashes  Neurological:     General: Awake, alert and oriented, grossly nonfocal  Psychiatric:        Mood and Affect: Mood normal.   Pertinent Microbiology Results for orders placed or performed during the hospital encounter of 07/10/24  Gastrointestinal Panel by PCR , Stool     Status: None   Collection Time: 07/12/24 11:31 AM   Specimen: Stool  Result Value Ref Range Status   Campylobacter species NOT DETECTED NOT DETECTED Final   Plesimonas shigelloides NOT DETECTED NOT DETECTED Final   Salmonella species NOT DETECTED NOT DETECTED Final   Yersinia enterocolitica NOT DETECTED NOT DETECTED Final   Vibrio species NOT DETECTED NOT DETECTED Final   Vibrio cholerae NOT DETECTED NOT DETECTED Final   Enteroaggregative E coli (EAEC) NOT DETECTED NOT DETECTED Final   Enteropathogenic E coli (EPEC) NOT DETECTED NOT DETECTED Final   Enterotoxigenic E coli (ETEC) NOT DETECTED NOT DETECTED Final   Shiga like  toxin producing E coli (STEC) NOT DETECTED NOT DETECTED Final   Shigella/Enteroinvasive E coli (EIEC) NOT DETECTED NOT DETECTED Final   Cryptosporidium NOT DETECTED NOT DETECTED Final   Cyclospora cayetanensis NOT DETECTED NOT DETECTED Final   Entamoeba histolytica NOT DETECTED NOT DETECTED Final   Giardia lamblia NOT DETECTED NOT DETECTED Final   Adenovirus F40/41 NOT DETECTED NOT DETECTED Final   Astrovirus NOT DETECTED NOT DETECTED Final   Norovirus GI/GII NOT DETECTED  NOT DETECTED Final   Rotavirus A NOT DETECTED NOT DETECTED Final   Sapovirus (I, II, IV, and V) NOT DETECTED NOT DETECTED Final    Comment: Performed at [facility], [address] [place], [place] [address]  C Difficile Quick Screen (NO PCR Reflex)     Status: None   Collection Time: 07/14/24  5:24 PM   Specimen: STOOL  Result Value Ref Range Status   C Diff antigen NEGATIVE NEGATIVE Final   C Diff toxin NEGATIVE NEGATIVE Final   C Diff interpretation No C. difficile detected.  Final    Comment: Performed at [facility], [address], [place], [place] [address]  Calprotectin, Fecal     Status: Abnormal   Collection Time: 07/14/24  5:24 PM   Specimen: STOOL  Result Value Ref Range Status   Calprotectin, Fecal >8,000 (H) 0 - 120 ug/g Final    Comment: (NOTE) **Results verified by repeat testing** Concentration     Interpretation   Follow-Up < 5 - 50 ug/g     Normal           None >50 -120 ug/g     Borderline       Re-evaluate in 4-6 weeks    >120 ug/g     Abnormal         Repeat as clinically                                   indicated Performed At: [facility] [address] [place], [place] [id number] Jennette Shorter MD Ey:[id number]    Pertinent Lab.    Latest Ref Rng & Units 08/09/2024    3:45 AM 08/08/2024    4:15 AM 08/07/2024   10:52 AM  CBC  WBC 4.0 - 10.5 K/uL 10.5  11.6  14.3   Hemoglobin 12.0 - 15.0 g/dL 7.7  8.7  8.7   Hematocrit 36.0 - 46.0 % 25.2  27.7  28.9   Platelets 150 - 400 K/uL 434  462  479       Latest Ref Rng & Units 08/09/2024    3:45 AM 08/08/2024    4:15 AM 08/07/2024   10:52 AM  CMP  Glucose 70 - 99 mg/dL 890  69  845   BUN 8 - 23 mg/dL 9  6  9    Creatinine 0.44 - 1.00 mg/dL 9.50  9.53  9.38   Sodium 135 - 145 mmol/L 136  136  130   Potassium 3.5 - 5.1 mmol/L 4.1  4.0  4.3   Chloride 98 - 111 mmol/L 100  100  96   CO2 22 - 32 mmol/L 25  24  22    Calcium  8.9 - 10.3 mg/dL 8.4  8.4  8.4   Total Protein  6.5 - 8.1 g/dL 4.9  5.1  5.4   Total Bilirubin 0.0 - 1.2 mg/dL <9.7  0.3  0.2   Alkaline Phos 38 -  126 U/L 74  71  79   AST 15 - 41 U/L <10  <10  14   ALT 0 - 44 U/L 15  15  15       Pertinent Imaging today Plain films and CT images have been personally visualized and interpreted; radiology reports have been reviewed. Decision making incorporated into the Impression  No results found.  I spent 50 minutes involved in face-to-face and non-face-to-face activities for this patient on the day of the visit. Professional time spent includes the following activities: Preparing to see the patient (review of tests), Obtaining and reviewing separately obtained history (hospitalist progress note, GI progress note), Performing a medically appropriate examination and evaluation, Ordering medications/labs, referring and communicating with other health care professionals, Documenting clinical information in the EMR, Independently interpreting results (not separately reported), Communicating results to the patient, Counseling and educating the patient and Care coordination (not separately reported).   Plan d/w requesting provider as well as ID pharm D  Of note, portions of this note may have been created with voice recognition software. While this note has been edited for accuracy, occasional wrong-word or 'sound-a-like' substitutions may have occurred due to the inherent limitations of voice recognition software.   Electronically signed by:   Annalee Orem, MD Infectious Disease Physician [facility] Pager: [phone]

## 2024-08-10 DIAGNOSIS — K51919 Ulcerative colitis, unspecified with unspecified complications: Secondary | ICD-10-CM | POA: Diagnosis not present

## 2024-08-10 DIAGNOSIS — R1110 Vomiting, unspecified: Secondary | ICD-10-CM | POA: Diagnosis not present

## 2024-08-10 DIAGNOSIS — B028 Zoster with other complications: Secondary | ICD-10-CM | POA: Diagnosis not present

## 2024-08-10 LAB — CBC WITH DIFFERENTIAL/PLATELET
Abs Immature Granulocytes: 1.22 K/uL — ABNORMAL HIGH (ref 0.00–0.07)
Basophils Absolute: 0.1 K/uL (ref 0.0–0.1)
Basophils Relative: 1 %
Eosinophils Absolute: 0 K/uL (ref 0.0–0.5)
Eosinophils Relative: 0 %
HCT: 24.4 % — ABNORMAL LOW (ref 36.0–46.0)
Hemoglobin: 7.6 g/dL — ABNORMAL LOW (ref 12.0–15.0)
Immature Granulocytes: 8 %
Lymphocytes Relative: 18 %
Lymphs Abs: 2.7 K/uL (ref 0.7–4.0)
MCH: 26.1 pg (ref 26.0–34.0)
MCHC: 31.1 g/dL (ref 30.0–36.0)
MCV: 83.8 fL (ref 80.0–100.0)
Monocytes Absolute: 1.6 K/uL — ABNORMAL HIGH (ref 0.1–1.0)
Monocytes Relative: 11 %
Neutro Abs: 9.4 K/uL — ABNORMAL HIGH (ref 1.7–7.7)
Neutrophils Relative %: 62 %
Platelets: 432 K/uL — ABNORMAL HIGH (ref 150–400)
RBC: 2.91 MIL/uL — ABNORMAL LOW (ref 3.87–5.11)
RDW: 17.2 % — ABNORMAL HIGH (ref 11.5–15.5)
WBC: 15.1 K/uL — ABNORMAL HIGH (ref 4.0–10.5)
nRBC: 0.3 % — ABNORMAL HIGH (ref 0.0–0.2)

## 2024-08-10 LAB — MAGNESIUM: Magnesium: 2.3 mg/dL (ref 1.7–2.4)

## 2024-08-10 LAB — GLUCOSE, CAPILLARY
Glucose-Capillary: 105 mg/dL — ABNORMAL HIGH (ref 70–99)
Glucose-Capillary: 136 mg/dL — ABNORMAL HIGH (ref 70–99)
Glucose-Capillary: 147 mg/dL — ABNORMAL HIGH (ref 70–99)
Glucose-Capillary: 171 mg/dL — ABNORMAL HIGH (ref 70–99)
Glucose-Capillary: 290 mg/dL — ABNORMAL HIGH (ref 70–99)
Glucose-Capillary: 91 mg/dL (ref 70–99)
Glucose-Capillary: 94 mg/dL (ref 70–99)

## 2024-08-10 LAB — COMPREHENSIVE METABOLIC PANEL WITH GFR
ALT: 16 U/L (ref 0–44)
AST: 13 U/L — ABNORMAL LOW (ref 15–41)
Albumin: 2.7 g/dL — ABNORMAL LOW (ref 3.5–5.0)
Alkaline Phosphatase: 76 U/L (ref 38–126)
Anion gap: 9 (ref 5–15)
BUN: 13 mg/dL (ref 8–23)
CO2: 27 mmol/L (ref 22–32)
Calcium: 8.5 mg/dL — ABNORMAL LOW (ref 8.9–10.3)
Chloride: 100 mmol/L (ref 98–111)
Creatinine, Ser: 0.73 mg/dL (ref 0.44–1.00)
GFR, Estimated: >60 mL/min (ref >60)
Glucose, Bld: 106 mg/dL — ABNORMAL HIGH (ref 70–99)
Potassium: 4.3 mmol/L (ref 3.5–5.1)
Sodium: 135 mmol/L (ref 135–145)
Total Bilirubin: <0.2 mg/dL (ref 0.0–1.2)
Total Protein: 4.9 g/dL — ABNORMAL LOW (ref 6.5–8.1)

## 2024-08-10 LAB — PHOSPHORUS: Phosphorus: 2.1 mg/dL — ABNORMAL LOW (ref 2.5–4.6)

## 2024-08-10 MED ORDER — K PHOS MONO-SOD PHOS DI & MONO 155-852-130 MG PO TABS
500.0000 mg | ORAL_TABLET | Freq: Two times a day (BID) | ORAL | Status: AC
  Administered 2024-08-10 – 2024-08-11 (×2): 500 mg via ORAL
  Filled 2024-08-10 (×2): qty 2

## 2024-08-10 NOTE — Progress Notes (Signed)
 PROGRESS NOTE    Nancy Eaton  FMW:[id number] DOB: October 20, 1956 DOA: 08/05/2024 PCP: Christopher Arland PARAS, PA-C   Brief Narrative:  Nancy Eaton is a 68 y.o. female with medical history significant for prediabetes, ulcerative colitis, hypertension, hyperlipidemia recently discharged from [facility] for ulcerative colitis flare on 8/29 now returns with dehydration, abdominal cramping and vomiting.  Found to have another ulcerative colitis flare and initially there was concern that her symptoms were related to acyclovir  but she has active herpes zoster so she will be placed back on IV acyclovir  as it is more of an intolerance than allergy.  This hospitalization she received a dose of Inflectra  and Rinvoq  has been discontinued and her Steroids have been changed from IV to po.  She is tolerating soft diet but ID is recommending continue IV acyclovir  for now monitoring her creatinine while we await VZV and HSV PCR.   Assessment and Plan:  Nausea and vomiting in the setting of ulcerative colitis: Improving. Now having abdominal discomfort, loose stools.  Initially thought to be in setting of valacyclovir  but she continues to have a mild leukocytosis but patient is on steroids, no focal abdominal tenderness, no evidence of acute infection. WBC Trend:  Recent Labs  Lab 07/19/24 0533 08/05/24 0956 08/06/24 0413 08/07/24 1052 08/08/24 0415 08/09/24 0345 08/10/24 0329  WBC 10.9* 12.0* 12.7* 14.3* 11.6* 10.5 15.1*  -Was on Soft Diet but having Sx so will cut down to FULL and her continued abdominal pain and loose stools -C/w Supportive care, Resumed IV Acyclovir  as below and ID recommends continuing pending the varicella zoster and HSV PCR pending; Will D/C patient home when clear from ID perspective.    Hyponatremia: Given the patient's recent vomiting, and evidence of dehydration on exam, this is likely a hypovolemic hyponatremia. -Hydrating w/ with normal saline at 100 mL/hr continuously; -Na+  Trend:  Recent Labs  Lab 08/05/24 0956 08/05/24 1649 08/06/24 0413 08/07/24 1052 08/08/24 0415 08/09/24 0345 08/10/24 0329  NA 128* 128* 128* 130* 136 136 135  -CTM and Trend and repeat CMP in the AM   Hypophosphatemia: Mild. Phos Level is now 2.1. Replete w/ po K Phos  Neutral 500 mg x1. CTM and Replete as Necessary. Repeat Phos Level in the AM  Buttock Rash in the setting of Active Herpes Zoster: Consult WOC nurse; Rash is Vesicular  ID consulted and Transferred her to Negative Pressure Room w/ Airborne and Contact Precautions. Getting IV Acyclovir  now and will be continued. ID checking Varicella Zoster via PCR and including HSV PCR and this is still pending; ID is tentatively planning to switch to p.o. valacyclovir  for discharge   Ulcerative colitis-admitted to the hospital with recent flare, improved with IV steroids and initiation of Rinvoq .  GI Dr. Rollin at the time of admission, who did not feel that imaging was necessary at this point in time. -Steroids change from IV to po and will Continue Prednisone  40 mg p.o. daily -Rinvoq  changed to Infliximab  by GI as she receives this 10 mg/kg infusion at 10 AM on 9/16 with improvement in her symptoms.  She was approved for 10 mg/kg infliximab  as an outpatient plan is to place her on this medication every 4 weeks per GI and they are recommending a repeat trough and antibody level to be obtained.  She is also amenable to start AZA at this time and will be initiated on this in the outpatient setting -Abdominal discomfort and loose stools improving and now tolerating SOFT Diet without issues  Essential Hypertension: Cozaar  was paused during last admission, blood pressure is currently normal so we will continue to hold this for the time being. CTM BP per Protocol. Last BP reading was 147/73   Type 2 Diabetes Mellitus: Most recent hemoglobin A1c 6.7, diet controlled. Moderate dose sliding scale q4h. CTM CBGs per Protocol. CBGs ranging from 91-250  the last 7 checks:    Chronic Iron  Deficiency Anemia: Hgb/Hct Trend was dropping slightly but now fairly stable:  Recent Labs  Lab 07/19/24 0533 08/05/24 0956 08/06/24 0413 08/07/24 1052 08/08/24 0415 08/09/24 0345 08/10/24 0329  HGB 9.2* 9.2* 8.8* 8.7* 8.7* 7.7* 7.6*  HCT 30.6* 29.7* 30.0* 28.9* 27.7* 25.2* 24.4*  MCV 83.2 82.5 85.0 83.0 83.4 83.2 83.8  -Check Anemia Panel in the AM; CTM Monitor for S/Sx of Bleeding; No overt bleeding noted. Repeat CBC in the AM   Chronic Thrombocytosis: Stable. Plt Count remains elevated but trending down and went from 534 -> 432. CTM and Trend and repeat CMP in the AM   GERD/GI Prophylaxis: C/w Pantoprazole  40 mg po Daily   Hypoalbuminemia: Patient's Albumin Lvl ranging from 2.3 -> 3.1 (2.7 today). CTM and Trend and repeat CMP in the AM  Overweight: Complicates overall prognosis and care. Estimated body mass index is 29.8 kg/m as calculated from the following:   Height as of this encounter: 5' 2.5 (1.588 m).   Weight as of this encounter: 75.1 kg. Weight Loss and Dietary Counseling given   DVT prophylaxis: enoxaparin  (LOVENOX ) injection 40 mg Start: 08/05/24 2200    Code Status: Full Code Family Communication: No family present @ bedside   Disposition Plan:  Level of care: Med-Surg Status is: Inpatient Remains inpatient appropriate because: Needs further clinical improvement and clearance by ID   Consultants:  Gastroenterology  Infectious Diseases   Procedures:  As delineated as above   Antimicrobials:  Anti-infectives (From admission, onward)    Start     Dose/Rate Route Frequency Ordered Stop   08/07/24 1400  acyclovir  (ZOVIRAX ) 750 mg in dextrose  5 % 250 mL IVPB        10 mg/kg  75.1 kg 265 mL/hr over 60 Minutes Intravenous Every 8 hours 08/07/24 1141     08/07/24 1000  acyclovir  (ZOVIRAX ) tablet 800 mg  Status:  Discontinued        800 mg Oral 5 times daily 08/07/24 0831 08/07/24 0839       Subjective: Seen and  examined at bedside and was doing okay.  Denies any complaints and has some mild abdominal discomfort.  Having bowel movements but thinks they are more formed.  No other concerns or complaint at this time.  Did not sleep very well last night.  No other concerns or complaints this time  Objective: Vitals:   08/09/24 1417 08/09/24 2211 08/10/24 0552 08/10/24 1442  BP: 130/78 122/61 124/72 (!) 147/73  Pulse: 86 90 68 96  Resp: 16 16 16 18   Temp: 98 F (36.7 C) 98.3 F (36.8 C) 97.6 F (36.4 C) 98 F (36.7 C)  TempSrc: Oral Oral  Oral  SpO2: 97% 94% 95% 97%  Weight:      Height:        Intake/Output Summary (Last 24 hours) at 08/10/2024 1607 Last data filed at 08/10/2024 0142 Gross per 24 hour  Intake 1351.88 ml  Output --  Net 1351.88 ml   Filed Weights   08/06/24 0857  Weight: 75.1 kg   Examination: Physical Exam:  Constitutional: WN/WD overweight  Caucasian female no acute distress Respiratory: Diminished to auscultation bilaterally, no wheezing, rales, rhonchi or crackles. Normal respiratory effort and patient is not tachypenic. No accessory muscle use.  Unlabored breathing Cardiovascular: RRR, no murmurs / rubs / gallops. S1 and S2 auscultated. No extremity edema.  Abdomen: Soft, not appreciably-tender, distended secondary body habitus. Bowel sounds positive.  GU: Deferred. Musculoskeletal: No clubbing / cyanosis of digits/nails. No joint deformity upper and lower extremities. Neurologic: CN 2-12 grossly intact with no focal deficits. Romberg sign and cerebellar reflexes not assessed.  Psychiatric: Normal judgment and insight. Alert and oriented x 3. Normal mood and appropriate affect.   Data Reviewed: I have personally reviewed following labs and imaging studies  CBC: Recent Labs  Lab 08/05/24 0956 08/06/24 0413 08/07/24 1052 08/08/24 0415 08/09/24 0345 08/10/24 0329  WBC 12.0* 12.7* 14.3* 11.6* 10.5 15.1*  NEUTROABS 8.0*  --  10.7* 6.7 6.4 9.4*  HGB 9.2* 8.8*  8.7* 8.7* 7.7* 7.6*  HCT 29.7* 30.0* 28.9* 27.7* 25.2* 24.4*  MCV 82.5 85.0 83.0 83.4 83.2 83.8  PLT 534* 535* 479* 462* 434* 432*   Basic Metabolic Panel: Recent Labs  Lab 08/06/24 0413 08/07/24 1052 08/08/24 0415 08/09/24 0345 08/10/24 0329  NA 128* 130* 136 136 135  K 4.0 4.3 4.0 4.1 4.3  CL 95* 96* 100 100 100  CO2 21* 22 24 25 27   GLUCOSE 89 154* 69* 109* 106*  BUN 14 9 6* 9 13  CREATININE 0.53 0.61 0.46 0.49 0.73  CALCIUM  8.4* 8.4* 8.4* 8.4* 8.5*  MG  --  2.4 2.5* 2.5* 2.3  PHOS  --  1.3* 3.8 2.4* 2.1*   GFR: Estimated Creatinine Clearance: 63.7 mL/min (by C-G formula based on SCr of 0.73 mg/dL). Liver Function Tests: Recent Labs  Lab 08/05/24 0956 08/07/24 1052 08/08/24 0415 08/09/24 0345 08/10/24 0329  AST 13* 14* <10* <10* 13*  ALT 14 15 15 15 16   ALKPHOS 73 79 71 74 76  BILITOT 0.3 0.2 0.3 <0.2 <0.2  PROT 5.7* 5.4* 5.1* 4.9* 4.9*  ALBUMIN 3.1* 2.9* 2.7* 2.6* 2.7*   Recent Labs  Lab 08/05/24 0956  LIPASE 19   No results for input(s): AMMONIA in the last 168 hours. Coagulation Profile: No results for input(s): INR, PROTIME in the last 168 hours. Cardiac Enzymes: No results for input(s): CKTOTAL, CKMB, CKMBINDEX, TROPONINI in the last 168 hours. BNP (last 3 results) No results for input(s): PROBNP in the last 8760 hours. HbA1C: No results for input(s): HGBA1C in the last 72 hours. CBG: Recent Labs  Lab 08/10/24 0050 08/10/24 0551 08/10/24 0616 08/10/24 0822 08/10/24 1219  GLUCAP 147* 94 91 105* 171*   Lipid Profile: No results for input(s): CHOL, HDL, LDLCALC, TRIG, CHOLHDL, LDLDIRECT in the last 72 hours. Thyroid Function Tests: No results for input(s): TSH, T4TOTAL, FREET4, T3FREE, THYROIDAB in the last 72 hours. Anemia Panel: No results for input(s): VITAMINB12, FOLATE, FERRITIN, TIBC, IRON , RETICCTPCT in the last 72 hours. Sepsis Labs: No results for input(s): PROCALCITON,  LATICACIDVEN in the last 168 hours.  No results found for this or any previous visit (from the past 240 hours).   Radiology Studies: No results found.  Scheduled Meds:  atorvastatin   40 mg Oral QHS   enoxaparin  (LOVENOX ) injection  40 mg Subcutaneous Q24H   feeding supplement (GLUCERNA SHAKE)  237 mL Oral TID BM   ferrous sulfate   325 mg Oral QODAY   insulin  aspart  0-15 Units Subcutaneous Q4H   pantoprazole   40  mg Oral Daily   phosphorus  500 mg Oral BID   predniSONE   40 mg Oral Q breakfast   Continuous Infusions:  sodium chloride  100 mL/hr at 08/10/24 0142   acyclovir  750 mg (08/10/24 1411)    LOS: 5 days   Alejandro Marker, DO [facility] Available via Epic secure chat 7am-7pm After these hours, please refer to coverage provider listed on [email or web address] 08/10/2024, 4:07 PM

## 2024-08-10 NOTE — Plan of Care (Signed)

## 2024-08-11 ENCOUNTER — Inpatient Hospital Stay (HOSPITAL_COMMUNITY)

## 2024-08-11 DIAGNOSIS — B028 Zoster with other complications: Secondary | ICD-10-CM | POA: Diagnosis not present

## 2024-08-11 DIAGNOSIS — K51919 Ulcerative colitis, unspecified with unspecified complications: Secondary | ICD-10-CM | POA: Diagnosis not present

## 2024-08-11 DIAGNOSIS — R1110 Vomiting, unspecified: Secondary | ICD-10-CM | POA: Diagnosis not present

## 2024-08-11 DIAGNOSIS — R109 Unspecified abdominal pain: Secondary | ICD-10-CM

## 2024-08-11 LAB — CBC WITH DIFFERENTIAL/PLATELET
Abs Immature Granulocytes: 0.81 K/uL — ABNORMAL HIGH (ref 0.00–0.07)
Basophils Absolute: 0.1 K/uL (ref 0.0–0.1)
Basophils Relative: 0 %
Eosinophils Absolute: 0 K/uL (ref 0.0–0.5)
Eosinophils Relative: 0 %
HCT: 25.1 % — ABNORMAL LOW (ref 36.0–46.0)
Hemoglobin: 7.9 g/dL — ABNORMAL LOW (ref 12.0–15.0)
Immature Granulocytes: 4 %
Lymphocytes Relative: 13 %
Lymphs Abs: 2.9 K/uL (ref 0.7–4.0)
MCH: 26.4 pg (ref 26.0–34.0)
MCHC: 31.5 g/dL (ref 30.0–36.0)
MCV: 83.9 fL (ref 80.0–100.0)
Monocytes Absolute: 1.9 K/uL — ABNORMAL HIGH (ref 0.1–1.0)
Monocytes Relative: 8 %
Neutro Abs: 17.2 K/uL — ABNORMAL HIGH (ref 1.7–7.7)
Neutrophils Relative %: 75 %
Platelets: 430 K/uL — ABNORMAL HIGH (ref 150–400)
RBC: 2.99 MIL/uL — ABNORMAL LOW (ref 3.87–5.11)
RDW: 17.4 % — ABNORMAL HIGH (ref 11.5–15.5)
WBC: 23 K/uL — ABNORMAL HIGH (ref 4.0–10.5)
nRBC: 0.3 % — ABNORMAL HIGH (ref 0.0–0.2)

## 2024-08-11 LAB — COMPREHENSIVE METABOLIC PANEL WITH GFR
ALT: 16 U/L (ref 0–44)
AST: 12 U/L — ABNORMAL LOW (ref 15–41)
Albumin: 2.6 g/dL — ABNORMAL LOW (ref 3.5–5.0)
Alkaline Phosphatase: 68 U/L (ref 38–126)
Anion gap: 11 (ref 5–15)
BUN: 10 mg/dL (ref 8–23)
CO2: 25 mmol/L (ref 22–32)
Calcium: 8.2 mg/dL — ABNORMAL LOW (ref 8.9–10.3)
Chloride: 100 mmol/L (ref 98–111)
Creatinine, Ser: 0.5 mg/dL (ref 0.44–1.00)
GFR, Estimated: >60 mL/min (ref >60)
Glucose, Bld: 85 mg/dL (ref 70–99)
Potassium: 3.6 mmol/L (ref 3.5–5.1)
Sodium: 136 mmol/L (ref 135–145)
Total Bilirubin: 0.3 mg/dL (ref 0.0–1.2)
Total Protein: 4.7 g/dL — ABNORMAL LOW (ref 6.5–8.1)

## 2024-08-11 LAB — HSV CULTURE AND TYPING

## 2024-08-11 LAB — GLUCOSE, CAPILLARY
Glucose-Capillary: 123 mg/dL — ABNORMAL HIGH (ref 70–99)
Glucose-Capillary: 123 mg/dL — ABNORMAL HIGH (ref 70–99)
Glucose-Capillary: 154 mg/dL — ABNORMAL HIGH (ref 70–99)
Glucose-Capillary: 168 mg/dL — ABNORMAL HIGH (ref 70–99)
Glucose-Capillary: 236 mg/dL — ABNORMAL HIGH (ref 70–99)
Glucose-Capillary: 78 mg/dL (ref 70–99)
Glucose-Capillary: 85 mg/dL (ref 70–99)

## 2024-08-11 LAB — IRON AND TIBC
Iron: 19 ug/dL — ABNORMAL LOW (ref 28–170)
Saturation Ratios: 11 % (ref 10.4–31.8)
TIBC: 169 ug/dL — ABNORMAL LOW (ref 250–450)
UIBC: 150 ug/dL

## 2024-08-11 LAB — VITAMIN B12: Vitamin B-12: 1068 pg/mL — ABNORMAL HIGH (ref 180–914)

## 2024-08-11 LAB — RETICULOCYTES
Immature Retic Fract: 42.5 % — ABNORMAL HIGH (ref 2.3–15.9)
RBC.: 2.9 MIL/uL — ABNORMAL LOW (ref 3.87–5.11)
Retic Count, Absolute: 48.7 K/uL (ref 19.0–186.0)
Retic Ct Pct: 1.7 % (ref 0.4–3.1)

## 2024-08-11 LAB — FOLATE: Folate: 11.3 ng/mL (ref >5.9)

## 2024-08-11 LAB — FERRITIN: Ferritin: 142 ng/mL (ref 11–307)

## 2024-08-11 LAB — VARICELLA-ZOSTER BY PCR: Varicella-Zoster, PCR: NEGATIVE

## 2024-08-11 LAB — MAGNESIUM: Magnesium: 2.2 mg/dL (ref 1.7–2.4)

## 2024-08-11 LAB — PHOSPHORUS: Phosphorus: 3.2 mg/dL (ref 2.5–4.6)

## 2024-08-11 MED ORDER — IOHEXOL 300 MG/ML  SOLN
100.0000 mL | Freq: Once | INTRAMUSCULAR | Status: AC | PRN
  Administered 2024-08-11: 100 mL via INTRAVENOUS

## 2024-08-11 NOTE — Progress Notes (Signed)
 PROGRESS NOTE    Nancy Eaton  FMW:[id number] DOB: Mar 30, 1956 DOA: 08/05/2024 PCP: Christopher Arland PARAS, PA-C   Brief Narrative:  Nancy Eaton is a 68 y.o. female with medical history significant for prediabetes, ulcerative colitis, hypertension, hyperlipidemia recently discharged from [facility] for ulcerative colitis flare on 8/29 now returns with dehydration, abdominal cramping and vomiting.  Found to have another ulcerative colitis flare and initially there was concern that her symptoms were related to acyclovir  but she has active herpes zoster so she will be placed back on IV acyclovir  as it is more of an intolerance than allergy.  This hospitalization she received a dose of Inflectra  and Rinvoq  has been discontinued and her Steroids have been changed from IV to po.  She is tolerating soft diet but ID is recommending continue IV acyclovir  for now monitoring her creatinine while we await VZV and HSV PCR.   Assessment and Plan:  Nausea and vomiting in the setting of ulcerative colitis:  Having abdominal discomfort, loose stools.  Initially thought to be in setting of valacyclovir  but likely Flare. WBC Trend:  Recent Labs  Lab 08/05/24 0956 08/06/24 0413 08/07/24 1052 08/08/24 0415 08/09/24 0345 08/10/24 0329 08/11/24 0409  WBC 12.0* 12.7* 14.3* 11.6* 10.5 15.1* 23.0*  -Was on Soft Diet but started having worsening Abdominal Symptoms. Repeat CT Abd/Pelvis: Re-demonstration of mild-to-moderate circumferential thickening of  the descending colon and proximal sigmoid colon with associated mild-to-moderate pericolonic fat stranding, prominence of vasa recta and small subcentimeter sized pericolonic lymph nodes. Findings are essentially similar in extent and severity when compared to the prior exam from 07/10/2024 and compatible with colitis, most likely infective/inflammatory in etiology. No associated pneumatosis, pneumoperitoneum or portal venous gas. There is trace ascites, which is  reactive. No walled-off abscess or loculated collection. -C/w Supportive care, Resumed IV Acyclovir  as below and ID recommends continuing pending the varicella zoster and HSV PCR pending; Will D/C patient home when clear from ID and GI perspective.  -GI had signed off but will need to be re-consulted.    Hyponatremia: Given the patient's recent vomiting, and evidence of dehydration on exam, this is likely a hypovolemic hyponatremia. -Hydrating w/ with normal saline at 100 mL/hr continuously as she is on IV Acyclovir  -Na+ is now 136. CTM and Trend and repeat CMP in the AM   Hypophosphatemia: Mild. Phos Level is now 3.2. CTM and Replete as Necessary. Repeat Phos Level in the AM  Buttock Rash in the setting of Active Herpes Zoster: Consult WOC nurse; Rash is Vesicular  ID consulted and Transferred her to Negative Pressure Room w/ Airborne and Contact Precautions. Getting IV Acyclovir  now and will be continued. ID checking Varicella Zoster via PCR and including HSV PCR and this is still pending; ID is tentatively planning to switch to p.o. Valacyclovir  for discharge   Ulcerative colitis-admitted to the hospital with recent flare, improved with IV steroids and initiation of Rinvoq .  GI Dr. Rollin at the time of admission, who did not feel that imaging was necessary at this point in time. -Steroids change from IV to po and will Continue Prednisone  40 mg p.o. daily -Rinvoq  changed to Infliximab  by GI as she receives this 10 mg/kg infusion at 10 AM on 9/16 with improvement in her symptoms.  She was approved for 10 mg/kg infliximab  as an outpatient plan is to place her on this medication every 4 weeks per GI and they are recommending a repeat trough and antibody level to be obtained.  She  is also amenable to start AZA at this time and will be initiated on this in the outpatient setting -Abdominal discomfort and loose stools were improving and she was tolerating SOFT Diet without issues until last night and today  where it worsening abdominal pain that was stabbing.  See above for CT scan.   Essential Hypertension: Cozaar  was paused during last admission, blood pressure is currently normal so we will continue to hold this for the time being. CTM BP per Protocol. Last BP reading was 147/73   Type 2 Diabetes Mellitus: Most recent hemoglobin A1c 6.7, diet controlled. Moderate dose sliding scale q4h. CTM CBGs per Protocol. CBGs ranging from 91-250 the last 7 checks:    Chronic Iron  Deficiency Anemia: Hgb/Hct Trend was dropping slightly but now fairly stable on the lower end with a hemoglobin/hematocrit being 7.9/25.1. Checked Anemia Panel and showed an iron  level of 19, UIBC 150, TIBC 169, saturation ratios of 11%, ferritin level 142, folate level 11.3 and a vitamin B12 level of 1068; CTM Monitor for S/Sx of Bleeding; No overt bleeding noted. Repeat CBC in the AM   Chronic Thrombocytosis: Stable. Plt Count remains elevated but trending down and went from 534 -> 430. CTM and Trend and repeat CMP in the AM   GERD/GI Prophylaxis: C/w Pantoprazole  40 mg po Daily   Hypoalbuminemia: Patient's Albumin Lvl ranging from 2.3 -> 3.1 (2.6 today). CTM and Trend and repeat CMP in the AM  Overweight: Complicates overall prognosis and care. Estimated body mass index is 29.8 kg/m as calculated from the following:   Height as of this encounter: 5' 2.5 (1.588 m).   Weight as of this encounter: 75.1 kg. Weight Loss and Dietary Counseling given   DVT prophylaxis: enoxaparin  (LOVENOX ) injection 40 mg Start: 08/05/24 2200    Code Status: Full Code Family Communication: No family present @ bedside  Disposition Plan:  Level of care: Med-Surg Status is: Inpatient Remains inpatient appropriate because: Needs further clinical improvement and clearance by the specialists   Consultants:  Infectious diseases Gastroenterology  Procedures:  As delineated as above  Antimicrobials:  Anti-infectives (From admission, onward)     Start     Dose/Rate Route Frequency Ordered Stop   08/07/24 1400  acyclovir  (ZOVIRAX ) 750 mg in dextrose  5 % 250 mL IVPB        10 mg/kg  75.1 kg 265 mL/hr over 60 Minutes Intravenous Every 8 hours 08/07/24 1141     08/07/24 1000  acyclovir  (ZOVIRAX ) tablet 800 mg  Status:  Discontinued        800 mg Oral 5 times daily 08/07/24 0831 08/07/24 0839       Subjective: Seen and examined at bedside and was having worsening abdominal discomfort and cramping and stabbing pain.  Did not eat very well and is now only tolerating clears.  Self reduced her diet from soft diet to clears due to her issues and symptoms.  Stools are more solid though not having very much diarrhea.  No other concerns or complaints at this time.  Objective: Vitals:   08/10/24 1442 08/10/24 2045 08/11/24 0459 08/11/24 1404  BP: (!) 147/73 122/68 130/74 129/75  Pulse: 96 88 71 76  Resp: 18 18 18 17   Temp: 98 F (36.7 C) 97.9 F (36.6 C)  98.2 F (36.8 C)  TempSrc: Oral Oral Oral Oral  SpO2: 97% 98% 97% 94%  Weight:      Height:        Intake/Output Summary (Last 24  hours) at 08/11/2024 1704 Last data filed at 08/11/2024 1329 Gross per 24 hour  Intake 2120 ml  Output --  Net 2120 ml   Filed Weights   08/06/24 0857  Weight: 75.1 kg   Examination: Physical Exam:  Constitutional: WN/WD, overweight chronically ill-appearing Caucasian female in who appears a little uncomfortable Respiratory: Diminished to auscultation bilaterally, no wheezing, rales, rhonchi or crackles. Normal respiratory effort and patient is not tachypenic. No accessory muscle use.  Unlabored breathing Cardiovascular: RRR, no murmurs / rubs / gallops. S1 and S2 auscultated.  Normal extremity edema Abdomen: Soft, tender to palpate and distended secondary body habitus. Bowel sounds positive.  GU: Deferred. Musculoskeletal: No clubbing / cyanosis of digits/nails. No joint deformity upper and lower extremities.  Skin: No rashes, lesions,  ulcers on limited skin evaluation. No induration; Warm and dry.  Neurologic: CN 2-12 grossly intact with no focal deficits. Romberg sign and cerebellar reflexes not assessed.  Psychiatric: Normal judgment and insight. Alert and oriented x 3. Normal mood and appropriate affect.   Data Reviewed: I have personally reviewed following labs and imaging studies  CBC: Recent Labs  Lab 08/07/24 1052 08/08/24 0415 08/09/24 0345 08/10/24 0329 08/11/24 0409  WBC 14.3* 11.6* 10.5 15.1* 23.0*  NEUTROABS 10.7* 6.7 6.4 9.4* 17.2*  HGB 8.7* 8.7* 7.7* 7.6* 7.9*  HCT 28.9* 27.7* 25.2* 24.4* 25.1*  MCV 83.0 83.4 83.2 83.8 83.9  PLT 479* 462* 434* 432* 430*   Basic Metabolic Panel: Recent Labs  Lab 08/07/24 1052 08/08/24 0415 08/09/24 0345 08/10/24 0329 08/11/24 0409  NA 130* 136 136 135 136  K 4.3 4.0 4.1 4.3 3.6  CL 96* 100 100 100 100  CO2 22 24 25 27 25   GLUCOSE 154* 69* 109* 106* 85  BUN 9 6* 9 13 10   CREATININE 0.61 0.46 0.49 0.73 0.50  CALCIUM  8.4* 8.4* 8.4* 8.5* 8.2*  MG 2.4 2.5* 2.5* 2.3 2.2  PHOS 1.3* 3.8 2.4* 2.1* 3.2   GFR: Estimated Creatinine Clearance: 63.7 mL/min (by C-G formula based on SCr of 0.5 mg/dL). Liver Function Tests: Recent Labs  Lab 08/07/24 1052 08/08/24 0415 08/09/24 0345 08/10/24 0329 08/11/24 0409  AST 14* <10* <10* 13* 12*  ALT 15 15 15 16 16   ALKPHOS 79 71 74 76 68  BILITOT 0.2 0.3 <0.2 <0.2 0.3  PROT 5.4* 5.1* 4.9* 4.9* 4.7*  ALBUMIN 2.9* 2.7* 2.6* 2.7* 2.6*   Recent Labs  Lab 08/05/24 0956  LIPASE 19   No results for input(s): AMMONIA in the last 168 hours. Coagulation Profile: No results for input(s): INR, PROTIME in the last 168 hours. Cardiac Enzymes: No results for input(s): CKTOTAL, CKMB, CKMBINDEX, TROPONINI in the last 168 hours. BNP (last 3 results) No results for input(s): PROBNP in the last 8760 hours. HbA1C: No results for input(s): HGBA1C in the last 72 hours. CBG: Recent Labs  Lab 08/11/24 0003  08/11/24 0457 08/11/24 0729 08/11/24 1201 08/11/24 1626  GLUCAP 123* 78 85 123* 236*   Lipid Profile: No results for input(s): CHOL, HDL, LDLCALC, TRIG, CHOLHDL, LDLDIRECT in the last 72 hours. Thyroid Function Tests: No results for input(s): TSH, T4TOTAL, FREET4, T3FREE, THYROIDAB in the last 72 hours. Anemia Panel: Recent Labs    08/11/24 0409  VITAMINB12 1,068*  FOLATE 11.3  FERRITIN 142  TIBC 169*  IRON  19*  RETICCTPCT 1.7   Sepsis Labs: No results for input(s): PROCALCITON, LATICACIDVEN in the last 168 hours.  Recent Results (from the past 240 hours)  Hsv  Culture And Typing     Status: None   Collection Time: 08/07/24  3:03 PM   Specimen: Skin, Other  Result Value Ref Range Status   HSV Culture/Type Comment  Final    Comment: (NOTE) Negative No Herpes simplex virus isolated. Performed At: [facility] [address] [place], [place] [id number] Jennette Shorter MD Ey:[id number]    Source of Sample NASOPHARYNGEAL SWAB  Final    Comment: Performed at [facility], [address] [place], [place] [address]    Radiology Studies: CT ABDOMEN PELVIS W CONTRAST Result Date: 08/11/2024 CLINICAL DATA:  Abdominal pain, acute, nonlocalized EXAM: CT ABDOMEN AND PELVIS WITH CONTRAST TECHNIQUE: Multidetector CT imaging of the abdomen and pelvis was performed using the standard protocol following bolus administration of intravenous contrast. RADIATION DOSE REDUCTION: This exam was performed according to the departmental dose-optimization program which includes automated exposure control, adjustment of the mA and/or kV according to patient size and/or use of iterative reconstruction technique. CONTRAST:  OMNIPAQUE  IOHEXOL  300 MG/ML  SOLN COMPARISON:  CT scan abdomen and pelvis from 07/10/2024. FINDINGS: Lower chest: There are subsegmental atelectatic changes in the left lung base. There also additional patchy areas of  scarring/atelectasis in the imaged bilateral lungs. No overt consolidation. No pleural effusion. The heart is normal in size. No pericardial effusion. Bilateral breast implants noted. There is there is right-sided intracapsular implant rupture. Hepatobiliary: The liver is normal in size. Non-cirrhotic configuration. No suspicious liver mass. Redemonstration of irregular marginated hypoattenuating area in the right hepatic lobe, segment 5/6 measuring 2.5 x 2.5 cm. No intrahepatic or extrahepatic bile duct dilation. No calcified gallstones. Normal gallbladder wall thickness. No pericholecystic inflammatory changes. Pancreas: Unremarkable. No pancreatic ductal dilatation or surrounding inflammatory changes. Spleen: Within normal limits. No focal lesion. Adrenals/Urinary Tract: There is stable subcentimeter sized left adrenal adenoma. Unremarkable right adrenal gland. No suspicious renal mass. There several simple cysts in the left kidney with largest measuring up to 7 x 12 mm. No nephroureterolithiasis or obstructive uropathy. Unremarkable urinary bladder. Stomach/Bowel: No disproportionate dilation of the small or large bowel loops. The appendix is unremarkable. Redemonstration of mild-to-moderate circumferential thickening of the descending colon and proximal sigmoid colon with associated mild-to-moderate pericolonic fat stranding, prominence of vasa recta and small subcentimeter sized pericolonic lymph nodes. Findings are essentially similar in extent and severity when compared to the prior exam from 07/10/2024 and compatible with colitis, most likely infective/inflammatory in etiology. There is no associated pneumatosis, pneumoperitoneum or portal venous gas. There is moderate stool burden including a 6.1 x 6.6 cm fecaloma in the right paramedian transverse colon (series 2, image 42). Overall less stool burden when compared to the recent prior exam. Vascular/Lymphatic: There is trace ascites mainly in the paracolic  gutters. No walled-off abscess. No pneumoperitoneum. No abdominal or pelvic lymphadenopathy, by size criteria. No aneurysmal dilation of the major abdominal arteries. There are mild peripheral atherosclerotic vascular calcifications of the aorta and its major branches. Reproductive: The uterus is unremarkable. No large adnexal mass. Other: Supraumbilical midline surgical scar noted. There is a tiny fat containing periumbilical hernia. The soft tissues and abdominal wall are otherwise unremarkable. Musculoskeletal: No suspicious osseous lesions. There are mild multilevel degenerative changes in the visualized spine. IMPRESSION: 1. Redemonstration of mild-to-moderate circumferential thickening of the descending colon and proximal sigmoid colon with associated mild-to-moderate pericolonic fat stranding, prominence of vasa recta and small subcentimeter sized pericolonic lymph nodes. Findings are essentially similar in extent and severity when compared to the  prior exam from 07/10/2024 and compatible with colitis, most likely infective/inflammatory in etiology. No associated pneumatosis, pneumoperitoneum or portal venous gas. There is trace ascites, which is reactive. No walled-off abscess or loculated collection. 2. Multiple other nonacute observations, as described above. Aortic Atherosclerosis (ICD10-I70.0). Electronically Signed   By: Ree Molt M.D.   On: 08/11/2024 11:16   Scheduled Meds:  atorvastatin   40 mg Oral QHS   enoxaparin  (LOVENOX ) injection  40 mg Subcutaneous Q24H   feeding supplement (GLUCERNA SHAKE)  237 mL Oral TID BM   ferrous sulfate   325 mg Oral QODAY   insulin  aspart  0-15 Units Subcutaneous Q4H   pantoprazole   40 mg Oral Daily   predniSONE   40 mg Oral Q breakfast   Continuous Infusions:  sodium chloride  100 mL/hr at 08/10/24 0142   acyclovir  750 mg (08/11/24 1329)    LOS: 6 days   Alejandro Marker, DO [facility] Available via Epic secure chat 7am-7pm After these hours,  please refer to coverage provider listed on [email or web address] 08/11/2024, 5:04 PM

## 2024-08-11 NOTE — Plan of Care (Signed)
  Problem: Education: Goal: Knowledge of General Education information will improve Description: Including pain rating scale, medication(s)/side effects and non-pharmacologic comfort measures Outcome: Progressing   Problem: Activity: Goal: Risk for activity intolerance will decrease Outcome: Progressing   Problem: Clinical Measurements: Goal: Will remain free from infection Outcome: Progressing   Problem: Nutrition: Goal: Adequate nutrition will be maintained Outcome: Progressing   Problem: Elimination: Goal: Will not experience complications related to bowel motility Outcome: Progressing

## 2024-08-11 NOTE — Plan of Care (Signed)
   Problem: Coping: Goal: Level of anxiety will decrease Outcome: Progressing   Problem: Pain Managment: Goal: General experience of comfort will improve and/or be controlled Outcome: Progressing

## 2024-08-11 NOTE — Plan of Care (Signed)
  Problem: Education: Goal: Knowledge of General Education information will improve Description: Including pain rating scale, medication(s)/side effects and non-pharmacologic comfort measures Outcome: Progressing   Problem: Activity: Goal: Risk for activity intolerance will decrease Outcome: Progressing   Problem: Elimination: Goal: Will not experience complications related to bowel motility Outcome: Progressing Goal: Will not experience complications related to urinary retention Outcome: Progressing   Problem: Safety: Goal: Ability to remain free from injury will improve Outcome: Progressing

## 2024-08-12 DIAGNOSIS — B009 Herpesviral infection, unspecified: Secondary | ICD-10-CM

## 2024-08-12 DIAGNOSIS — B029 Zoster without complications: Secondary | ICD-10-CM

## 2024-08-12 DIAGNOSIS — L989 Disorder of the skin and subcutaneous tissue, unspecified: Secondary | ICD-10-CM

## 2024-08-12 DIAGNOSIS — R1110 Vomiting, unspecified: Secondary | ICD-10-CM | POA: Diagnosis not present

## 2024-08-12 LAB — COMPREHENSIVE METABOLIC PANEL WITH GFR
ALT: 16 U/L (ref 0–44)
AST: <10 U/L — ABNORMAL LOW (ref 15–41)
Albumin: 2.7 g/dL — ABNORMAL LOW (ref 3.5–5.0)
Alkaline Phosphatase: 70 U/L (ref 38–126)
Anion gap: 11 (ref 5–15)
BUN: 7 mg/dL — ABNORMAL LOW (ref 8–23)
CO2: 26 mmol/L (ref 22–32)
Calcium: 8.8 mg/dL — ABNORMAL LOW (ref 8.9–10.3)
Chloride: 98 mmol/L (ref 98–111)
Creatinine, Ser: 0.48 mg/dL (ref 0.44–1.00)
GFR, Estimated: >60 mL/min (ref >60)
Glucose, Bld: 71 mg/dL (ref 70–99)
Potassium: 3.7 mmol/L (ref 3.5–5.1)
Sodium: 135 mmol/L (ref 135–145)
Total Bilirubin: 0.3 mg/dL (ref 0.0–1.2)
Total Protein: 5.2 g/dL — ABNORMAL LOW (ref 6.5–8.1)

## 2024-08-12 LAB — CBC WITH DIFFERENTIAL/PLATELET
Abs Immature Granulocytes: 0.44 K/uL — ABNORMAL HIGH (ref 0.00–0.07)
Basophils Absolute: 0 K/uL (ref 0.0–0.1)
Basophils Relative: 0 %
Eosinophils Absolute: 0 K/uL (ref 0.0–0.5)
Eosinophils Relative: 0 %
HCT: 26.1 % — ABNORMAL LOW (ref 36.0–46.0)
Hemoglobin: 8.2 g/dL — ABNORMAL LOW (ref 12.0–15.0)
Immature Granulocytes: 3 %
Lymphocytes Relative: 15 %
Lymphs Abs: 2.7 K/uL (ref 0.7–4.0)
MCH: 26.4 pg (ref 26.0–34.0)
MCHC: 31.4 g/dL (ref 30.0–36.0)
MCV: 83.9 fL (ref 80.0–100.0)
Monocytes Absolute: 1.6 K/uL — ABNORMAL HIGH (ref 0.1–1.0)
Monocytes Relative: 9 %
Neutro Abs: 12.9 K/uL — ABNORMAL HIGH (ref 1.7–7.7)
Neutrophils Relative %: 73 %
Platelets: 380 K/uL (ref 150–400)
RBC: 3.11 MIL/uL — ABNORMAL LOW (ref 3.87–5.11)
RDW: 17.2 % — ABNORMAL HIGH (ref 11.5–15.5)
WBC: 17.6 K/uL — ABNORMAL HIGH (ref 4.0–10.5)
nRBC: 0.3 % — ABNORMAL HIGH (ref 0.0–0.2)

## 2024-08-12 LAB — MAGNESIUM: Magnesium: 2.4 mg/dL (ref 1.7–2.4)

## 2024-08-12 LAB — GLUCOSE, CAPILLARY
Glucose-Capillary: 71 mg/dL (ref 70–99)
Glucose-Capillary: 83 mg/dL (ref 70–99)
Glucose-Capillary: 117 mg/dL — ABNORMAL HIGH (ref 70–99)
Glucose-Capillary: 217 mg/dL — ABNORMAL HIGH (ref 70–99)
Glucose-Capillary: 132 mg/dL — ABNORMAL HIGH (ref 70–99)

## 2024-08-12 LAB — PHOSPHORUS: Phosphorus: 3.8 mg/dL (ref 2.5–4.6)

## 2024-08-12 MED ORDER — ORAL CARE MOUTH RINSE: 15.0000 mL | OROMUCOSAL | Status: DC | PRN

## 2024-08-12 NOTE — Plan of Care (Signed)

## 2024-08-12 NOTE — Progress Notes (Signed)
 PROGRESS NOTE    Nancy Eaton  FMW:[id number] DOB: [birth year] DOA: 08/05/2024 PCP: Christopher Arland PARAS, PA-C    Brief Narrative:  This 68 y.o. female with medical history significant for prediabetes, ulcerative colitis, hypertension, hyperlipidemia recently discharged from [facility] after ulcerative colitis flare on 07/19/24 now returns with dehydration, abdominal cramping and vomiting.  Found to have another ulcerative colitis flare and initially there was concern that her symptoms were related to acyclovir  As she has active herpes zoster so she will be placed back on IV acyclovir  as it is more of an intolerance than allergy.  This hospitalization She received a dose of Inflectra  and Rinvoq  has been discontinued and her Steroids have been changed from IV to po.  She is tolerating soft diet but ID is recommending continue IV acyclovir  for now monitoring her creatinine while we await VZV and HSV PCR.   Assessment & Plan:   Principal Problem:   Intractable vomiting   Nausea and vomiting in the setting of ulcerative colitis:   She presented with abdominal discomfort, loose stools, Nausea and vomiting. Initially thought to be in setting of valacyclovir  but likely Flare.  She was started on soft diet but then started having abdominal symptoms. Repeat CT A/P;  Findings  most likely infective/inflammatory Colitis in etiology.  C/w Supportive care, Resumed IV Acyclovir . ID recommends continuing pending the varicella zoster and HSV PCR pending;  Will D/C patient home when clear from ID and GI perspective.  GI had signed off but will need to be re-consulted if patient continue to have abdominal pain.SABRA    Hyponatremia: Likely hypovolemic hyponatremia. Improved with IV hydration.   Hypophosphatemia:  Replaced.  Resolved  Buttock Rash in the setting of Active Herpes Zoster :  Consulted WOC nurse; Rash is Vesicular   ID consulted and Transferred her to Negative Pressure Room w/ Airborne and Contact  Precautions.  Getting IV Acyclovir  now and will be continued.  Awaiting Varicella Zoster PCR , HSV PCR and this is still pending;  ID is tentatively planning to switch to p.o. Valacyclovir  for discharge.   Ulcerative colitis: She was admitted to the hospital with recent flare, improved with IV steroids and initiation of Rinvoq .   GI Dr. Rollin at the time of admission, who did not feel that imaging was necessary at this point in time. Continue Prednisone  40 mg p.o. daily -Rinvoq  changed to Infliximab  by GI as she receives this 10 mg/kg infusion at 10 AM on 9/16 with improvement in her symptoms.  She was approved for 10 mg/kg infliximab  as an outpatient . Plan is to place her on this medication every 4 weeks per GI and they are recommending a repeat trough and antibody level to be obtained.  She is also amenable to start AZA at this time and will be initiated on this in the outpatient setting Abdominal discomfort and loose stools are improving and she is tolerating soft diet without issues.   Essential Hypertension:  Blood pressure is normal.  Continue to monitor.  Type 2 Diabetes Mellitus:  HbA1c 6.7, diet controlled.   Chronic Iron  Deficiency Anemia:  H&H relatively stable. No signs of any active bleeding.   Continue to monitor hemoglobin.   Chronic Thrombocytosis: Stable. Resolved.  GERD/GI Prophylaxis:  Continue pantoprazole  40 mg daily.   Hypoalbuminemia:  CTM and Trend and repeat CMP in the AM.   Overweight: Complicates overall prognosis and care.  Estimated body mass index is 29.8 kg/m as calculated from the following:  Height as of this encounter: 5' 2.5 (1.588 m).   Weight as of this encounter: 75.1 kg. Weight Loss and Dietary Counseling given   DVT prophylaxis: Lovenox  Code Status: Full code Family Communication: No family at bed side Disposition Plan:  Status is: Inpatient Remains inpatient appropriate because: Severity of illness.    Consultants:   GI ID  Procedures:   Antimicrobials:  Anti-infectives (From admission, onward)    Start     Dose/Rate Route Frequency Ordered Stop   08/07/24 1400  acyclovir  (ZOVIRAX ) 750 mg in dextrose  5 % 250 mL IVPB        10 mg/kg  75.1 kg 265 mL/hr over 60 Minutes Intravenous Every 8 hours 08/07/24 1141     08/07/24 1000  acyclovir  (ZOVIRAX ) tablet 800 mg  Status:  Discontinued        800 mg Oral 5 times daily 08/07/24 0831 08/07/24 0839      Subjective: Patient seen and examined at bedside.  Overnight events noted. Patient reports feeling better, still reports having mild abdominal discomfort, She denies any nausea and vomiting.  Objective: Vitals:   08/11/24 1404 08/11/24 2038 08/12/24 0509 08/12/24 0824  BP: 129/75 (!) 142/82 120/67 (!) 141/73  Pulse: 76 71 78 79  Resp: 17 20 18 17   Temp: 98.2 F (36.8 C) 98.3 F (36.8 C) 98.1 F (36.7 C)   TempSrc: Oral Oral Oral   SpO2: 94% 96% 97% 97%  Weight:      Height:        Intake/Output Summary (Last 24 hours) at 08/12/2024 1035 Last data filed at 08/12/2024 0753 Gross per 24 hour  Intake 4299.54 ml  Output --  Net 4299.54 ml   Filed Weights   08/06/24 0857  Weight: 75.1 kg    Examination:  General exam: Appears calm and comfortable, not in any acute distress. Respiratory system: Clear to auscultation. Respiratory effort normal.  RR 14 Cardiovascular system: S1 & S2 heard, RRR. No JVD, murmurs, rubs, gallops or clicks. Gastrointestinal system: Abdomen is non distended, soft and non tender. Normal bowel sounds heard. Central nervous system: Alert and oriented x 3. No focal neurological deficits. Extremities: No edema, no cyanosis, no clubbing. Skin: No rashes, lesions or ulcers Psychiatry: Judgement and insight appear normal. Mood & affect appropriate.     Data Reviewed: I have personally reviewed following labs and imaging studies  CBC: Recent Labs  Lab 08/08/24 0415 08/09/24 0345 08/10/24 0329 08/11/24 0409  08/12/24 0314  WBC 11.6* 10.5 15.1* 23.0* 17.6*  NEUTROABS 6.7 6.4 9.4* 17.2* 12.9*  HGB 8.7* 7.7* 7.6* 7.9* 8.2*  HCT 27.7* 25.2* 24.4* 25.1* 26.1*  MCV 83.4 83.2 83.8 83.9 83.9  PLT 462* 434* 432* 430* 380   Basic Metabolic Panel: Recent Labs  Lab 08/08/24 0415 08/09/24 0345 08/10/24 0329 08/11/24 0409 08/12/24 0314  NA 136 136 135 136 135  K 4.0 4.1 4.3 3.6 3.7  CL 100 100 100 100 98  CO2 24 25 27 25 26   GLUCOSE 69* 109* 106* 85 71  BUN 6* 9 13 10  7*  CREATININE 0.46 0.49 0.73 0.50 0.48  CALCIUM  8.4* 8.4* 8.5* 8.2* 8.8*  MG 2.5* 2.5* 2.3 2.2 2.4  PHOS 3.8 2.4* 2.1* 3.2 3.8   GFR: Estimated Creatinine Clearance: 63.7 mL/min (by C-G formula based on SCr of 0.48 mg/dL). Liver Function Tests: Recent Labs  Lab 08/08/24 0415 08/09/24 0345 08/10/24 0329 08/11/24 0409 08/12/24 0314  AST <10* <10* 13* 12* <10*  ALT  15 15 16 16 16   ALKPHOS 71 74 76 68 70  BILITOT 0.3 <0.2 <0.2 0.3 0.3  PROT 5.1* 4.9* 4.9* 4.7* 5.2*  ALBUMIN 2.7* 2.6* 2.7* 2.6* 2.7*   No results for input(s): LIPASE, AMYLASE in the last 168 hours. No results for input(s): AMMONIA in the last 168 hours. Coagulation Profile: No results for input(s): INR, PROTIME in the last 168 hours. Cardiac Enzymes: No results for input(s): CKTOTAL, CKMB, CKMBINDEX, TROPONINI in the last 168 hours. BNP (last 3 results) No results for input(s): PROBNP in the last 8760 hours. HbA1C: No results for input(s): HGBA1C in the last 72 hours. CBG: Recent Labs  Lab 08/11/24 1626 08/11/24 2035 08/11/24 2331 08/12/24 0313 08/12/24 0746  GLUCAP 236* 168* 154* 71 83   Lipid Profile: No results for input(s): CHOL, HDL, LDLCALC, TRIG, CHOLHDL, LDLDIRECT in the last 72 hours. Thyroid Function Tests: No results for input(s): TSH, T4TOTAL, FREET4, T3FREE, THYROIDAB in the last 72 hours. Anemia Panel: Recent Labs    08/11/24 0409  VITAMINB12 1,068*  FOLATE 11.3  FERRITIN 142   TIBC 169*  IRON  19*  RETICCTPCT 1.7   Sepsis Labs: No results for input(s): PROCALCITON, LATICACIDVEN in the last 168 hours.  Recent Results (from the past 240 hours)  Varicella-zoster by PCR     Status: None   Collection Time: 08/07/24  3:03 PM   Specimen: Skin, Other; Sterile Swab  Result Value Ref Range Status   Varicella-Zoster, PCR Negative Negative Final    Comment: (NOTE) No Varicella Zoster Virus DNA detected. This test was developed and its performance characteristics determined by [facility]. It has not been cleared or approved by the Food and Drug Administration. Performed At: [facility] [address] [place], [place] [id number] Jennette Shorter MD Ey:[id number]   Hsv Culture And Typing     Status: None   Collection Time: 08/07/24  3:03 PM   Specimen: Skin, Other  Result Value Ref Range Status   HSV Culture/Type Comment  Final    Comment: (NOTE) Negative No Herpes simplex virus isolated. Performed At: [facility] [address] [place], [place] [id number] Jennette Shorter MD Ey:[id number]    Source of Sample NASOPHARYNGEAL SWAB  Final    Comment: Performed at [facility], [address], [place], [place] [address]    Radiology Studies: CT ABDOMEN PELVIS W CONTRAST Result Date: 08/11/2024 CLINICAL DATA:  Abdominal pain, acute, nonlocalized EXAM: CT ABDOMEN AND PELVIS WITH CONTRAST TECHNIQUE: Multidetector CT imaging of the abdomen and pelvis was performed using the standard protocol following bolus administration of intravenous contrast. RADIATION DOSE REDUCTION: This exam was performed according to the departmental dose-optimization program which includes automated exposure control, adjustment of the mA and/or kV according to patient size and/or use of iterative reconstruction technique. CONTRAST:  OMNIPAQUE  IOHEXOL  300 MG/ML  SOLN COMPARISON:  CT scan abdomen and pelvis from 07/10/2024. FINDINGS: Lower chest: There are  subsegmental atelectatic changes in the left lung base. There also additional patchy areas of scarring/atelectasis in the imaged bilateral lungs. No overt consolidation. No pleural effusion. The heart is normal in size. No pericardial effusion. Bilateral breast implants noted. There is there is right-sided intracapsular implant rupture. Hepatobiliary: The liver is normal in size. Non-cirrhotic configuration. No suspicious liver mass. Redemonstration of irregular marginated hypoattenuating area in the right hepatic lobe, segment 5/6 measuring 2.5 x 2.5 cm. No intrahepatic or extrahepatic bile duct dilation. No calcified gallstones. Normal gallbladder wall thickness. No pericholecystic inflammatory changes. Pancreas:  Unremarkable. No pancreatic ductal dilatation or surrounding inflammatory changes. Spleen: Within normal limits. No focal lesion. Adrenals/Urinary Tract: There is stable subcentimeter sized left adrenal adenoma. Unremarkable right adrenal gland. No suspicious renal mass. There several simple cysts in the left kidney with largest measuring up to 7 x 12 mm. No nephroureterolithiasis or obstructive uropathy. Unremarkable urinary bladder. Stomach/Bowel: No disproportionate dilation of the small or large bowel loops. The appendix is unremarkable. Redemonstration of mild-to-moderate circumferential thickening of the descending colon and proximal sigmoid colon with associated mild-to-moderate pericolonic fat stranding, prominence of vasa recta and small subcentimeter sized pericolonic lymph nodes. Findings are essentially similar in extent and severity when compared to the prior exam from 07/10/2024 and compatible with colitis, most likely infective/inflammatory in etiology. There is no associated pneumatosis, pneumoperitoneum or portal venous gas. There is moderate stool burden including a 6.1 x 6.6 cm fecaloma in the right paramedian transverse colon (series 2, image 42). Overall less stool burden when  compared to the recent prior exam. Vascular/Lymphatic: There is trace ascites mainly in the paracolic gutters. No walled-off abscess. No pneumoperitoneum. No abdominal or pelvic lymphadenopathy, by size criteria. No aneurysmal dilation of the major abdominal arteries. There are mild peripheral atherosclerotic vascular calcifications of the aorta and its major branches. Reproductive: The uterus is unremarkable. No large adnexal mass. Other: Supraumbilical midline surgical scar noted. There is a tiny fat containing periumbilical hernia. The soft tissues and abdominal wall are otherwise unremarkable. Musculoskeletal: No suspicious osseous lesions. There are mild multilevel degenerative changes in the visualized spine. IMPRESSION: 1. Redemonstration of mild-to-moderate circumferential thickening of the descending colon and proximal sigmoid colon with associated mild-to-moderate pericolonic fat stranding, prominence of vasa recta and small subcentimeter sized pericolonic lymph nodes. Findings are essentially similar in extent and severity when compared to the prior exam from 07/10/2024 and compatible with colitis, most likely infective/inflammatory in etiology. No associated pneumatosis, pneumoperitoneum or portal venous gas. There is trace ascites, which is reactive. No walled-off abscess or loculated collection. 2. Multiple other nonacute observations, as described above. Aortic Atherosclerosis (ICD10-I70.0). Electronically Signed   By: Ree Molt M.D.   On: 08/11/2024 11:16   Scheduled Meds:  atorvastatin   40 mg Oral QHS   enoxaparin  (LOVENOX ) injection  40 mg Subcutaneous Q24H   feeding supplement (GLUCERNA SHAKE)  237 mL Oral TID BM   ferrous sulfate   325 mg Oral QODAY   insulin  aspart  0-15 Units Subcutaneous Q4H   pantoprazole   40 mg Oral Daily   predniSONE   40 mg Oral Q breakfast   Continuous Infusions:  sodium chloride  100 mL/hr at 08/12/24 0341   acyclovir  750 mg (08/12/24 0346)     LOS: 7  days    Time spent: 50 mins    Darcel Dawley, MD [facility]   If 7PM-7AM, please contact night-coverage

## 2024-08-12 NOTE — TOC Progression Note (Signed)
 Transition of Care [facility]) - Progression Note    Patient Details  Name: Nancy Eaton MRN: [id number] Date of Birth: 02-23-1956  Transition of Care [facility]) CM/SW Contact  Alfonse JONELLE Rex, RN Phone Number: 08/12/2024, 3:26 PM  Clinical Narrative:  Per MD 9/22 progress note: abdominal pain and loose stools are improving,  tolerating soft diet . TOC will continue to follow.      Expected Discharge Plan: Home/Self Care Barriers to Discharge: No Barriers Identified               Expected Discharge Plan and Services In-house Referral: NA Discharge Planning Services: CM Consult   Living arrangements for the past 2 months: Single Family Home                 DME Arranged: N/A DME Agency: NA       HH Arranged: NA HH Agency: NA         Social Drivers of Health (SDOH) Interventions SDOH Screenings   Food Insecurity: No Food Insecurity (08/05/2024)  Housing: Low Risk  (08/05/2024)  Transportation Needs: No Transportation Needs (08/05/2024)  Utilities: Not At Risk (08/05/2024)  Social Connections: Unknown (08/05/2024)  Tobacco Use: Low Risk  (08/05/2024)    Readmission Risk Interventions    08/06/2024    3:36 PM 07/16/2024    1:58 PM  Readmission Risk Prevention Plan  Transportation Screening Complete Complete  HRI or Home Care Consult  Complete  Social Work Consult for Recovery Care Planning/Counseling  Complete  Palliative Care Screening  Not Applicable  Medication Review [employer]) Complete Complete  PCP or Specialist appointment within 3-5 days of discharge Complete   HRI or Home Care Consult Complete   SW Recovery Care/Counseling Consult Complete   Palliative Care Screening Not Applicable   Skilled Nursing Facility Not Applicable

## 2024-08-12 NOTE — Progress Notes (Signed)
 Date of Admission:  08/05/2024      ID: Nancy Eaton is a 68 y.o. female Principal Problem:   Intractable vomiting    Subjective: C/o sharp lower abdominal pain with eating and drinking   Medications:   atorvastatin   40 mg Oral QHS   enoxaparin  (LOVENOX ) injection  40 mg Subcutaneous Q24H   feeding supplement (GLUCERNA SHAKE)  237 mL Oral TID BM   ferrous sulfate   325 mg Oral QODAY   insulin  aspart  0-15 Units Subcutaneous Q4H   pantoprazole   40 mg Oral Daily   predniSONE   40 mg Oral Q breakfast    Objective: Vital signs in last 24 hours: Patient Vitals for the past 24 hrs:  BP Temp Temp src Pulse Resp SpO2  08/12/24 0824 (!) 141/73 -- -- 79 17 97 %  08/12/24 0509 120/67 98.1 F (36.7 C) Oral 78 18 97 %  08/11/24 2038 (!) 142/82 98.3 F (36.8 C) Oral 71 20 96 %  08/11/24 1404 129/75 98.2 F (36.8 C) Oral 76 17 94 %       PHYSICAL EXAM:  General: Alert, cooperative, no distress now , appears stated age.  Lungs: Clear to auscultation bilaterally. No Wheezing or Rhonchi. No rales. Heart: Regular rate and rhythm, no murmur, rub or gallop. Abdomen: Soft, non-tender,not distended. Bowel sounds normal. No masses Back- intergulteal and b/l buttock punched out lesions Extremities: atraumatic, no cyanosis. No edema. No clubbing Skin: No rashes or lesions. Or bruising Lymph: Cervical, supraclavicular normal. Neurologic: Grossly non-focal Rt TKA  Lab Results    Latest Ref Rng & Units 08/12/2024    3:14 AM 08/11/2024    4:09 AM 08/10/2024    3:29 AM  CBC  WBC 4.0 - 10.5 K/uL 17.6  23.0  15.1   Hemoglobin 12.0 - 15.0 g/dL 8.2  7.9  7.6   Hematocrit 36.0 - 46.0 % 26.1  25.1  24.4   Platelets 150 - 400 K/uL 380  430  432        Latest Ref Rng & Units 08/12/2024    3:14 AM 08/11/2024    4:09 AM 08/10/2024    3:29 AM  CMP  Glucose 70 - 99 mg/dL 71  85  893   BUN 8 - 23 mg/dL 7  10  13    Creatinine 0.44 - 1.00 mg/dL 9.51  9.49  9.26   Sodium 135 - 145 mmol/L 135   136  135   Potassium 3.5 - 5.1 mmol/L 3.7  3.6  4.3   Chloride 98 - 111 mmol/L 98  100  100   CO2 22 - 32 mmol/L 26  25  27    Calcium  8.9 - 10.3 mg/dL 8.8  8.2  8.5   Total Protein 6.5 - 8.1 g/dL 5.2  4.7  4.9   Total Bilirubin 0.0 - 1.2 mg/dL 0.3  0.3  <9.7   Alkaline Phos 38 - 126 U/L 70  68  76   AST 15 - 41 U/L 10  12  13    ALT 0 - 44 U/L 16  16  16        Microbiology: HSV culture neg VZV pcr neg Studies/Results: CT ABDOMEN PELVIS W CONTRAST Result Date: 08/11/2024 CLINICAL DATA:  Abdominal pain, acute, nonlocalized EXAM: CT ABDOMEN AND PELVIS WITH CONTRAST TECHNIQUE: Multidetector CT imaging of the abdomen and pelvis was performed using the standard protocol following bolus administration of intravenous contrast. RADIATION DOSE REDUCTION: This exam was performed according to  the departmental dose-optimization program which includes automated exposure control, adjustment of the mA and/or kV according to patient size and/or use of iterative reconstruction technique. CONTRAST:  OMNIPAQUE  IOHEXOL  300 MG/ML  SOLN COMPARISON:  CT scan abdomen and pelvis from 07/10/2024. FINDINGS: Lower chest: There are subsegmental atelectatic changes in the left lung base. There also additional patchy areas of scarring/atelectasis in the imaged bilateral lungs. No overt consolidation. No pleural effusion. The heart is normal in size. No pericardial effusion. Bilateral breast implants noted. There is there is right-sided intracapsular implant rupture. Hepatobiliary: The liver is normal in size. Non-cirrhotic configuration. No suspicious liver mass. Redemonstration of irregular marginated hypoattenuating area in the right hepatic lobe, segment 5/6 measuring 2.5 x 2.5 cm. No intrahepatic or extrahepatic bile duct dilation. No calcified gallstones. Normal gallbladder wall thickness. No pericholecystic inflammatory changes. Pancreas: Unremarkable. No pancreatic ductal dilatation or surrounding inflammatory  changes. Spleen: Within normal limits. No focal lesion. Adrenals/Urinary Tract: There is stable subcentimeter sized left adrenal adenoma. Unremarkable right adrenal gland. No suspicious renal mass. There several simple cysts in the left kidney with largest measuring up to 7 x 12 mm. No nephroureterolithiasis or obstructive uropathy. Unremarkable urinary bladder. Stomach/Bowel: No disproportionate dilation of the small or large bowel loops. The appendix is unremarkable. Redemonstration of mild-to-moderate circumferential thickening of the descending colon and proximal sigmoid colon with associated mild-to-moderate pericolonic fat stranding, prominence of vasa recta and small subcentimeter sized pericolonic lymph nodes. Findings are essentially similar in extent and severity when compared to the prior exam from 07/10/2024 and compatible with colitis, most likely infective/inflammatory in etiology. There is no associated pneumatosis, pneumoperitoneum or portal venous gas. There is moderate stool burden including a 6.1 x 6.6 cm fecaloma in the right paramedian transverse colon (series 2, image 42). Overall less stool burden when compared to the recent prior exam. Vascular/Lymphatic: There is trace ascites mainly in the paracolic gutters. No walled-off abscess. No pneumoperitoneum. No abdominal or pelvic lymphadenopathy, by size criteria. No aneurysmal dilation of the major abdominal arteries. There are mild peripheral atherosclerotic vascular calcifications of the aorta and its major branches. Reproductive: The uterus is unremarkable. No large adnexal mass. Other: Supraumbilical midline surgical scar noted. There is a tiny fat containing periumbilical hernia. The soft tissues and abdominal wall are otherwise unremarkable. Musculoskeletal: No suspicious osseous lesions. There are mild multilevel degenerative changes in the visualized spine. IMPRESSION: 1. Redemonstration of mild-to-moderate circumferential thickening of  the descending colon and proximal sigmoid colon with associated mild-to-moderate pericolonic fat stranding, prominence of vasa recta and small subcentimeter sized pericolonic lymph nodes. Findings are essentially similar in extent and severity when compared to the prior exam from 07/10/2024 and compatible with colitis, most likely infective/inflammatory in etiology. No associated pneumatosis, pneumoperitoneum or portal venous gas. There is trace ascites, which is reactive. No walled-off abscess or loculated collection. 2. Multiple other nonacute observations, as described above. Aortic Atherosclerosis (ICD10-I70.0). Electronically Signed   By: Ree Molt M.D.   On: 08/11/2024 11:16     Assessment/Plan: Lesions intergluteal and buttock area- VZV VS herpes Pt on acyclovir  She is immune compromised with TNFI and recived a dose in the hospital on 9/16 which can make the viral lesions worse Will send HSV PCR On discharge will switch to valtrex  1 gram PO bID for 10 days and then reduce to 500mg  every day for a long period  Vomiting and abdominal pain due to the UC Recommend upper GI endoscopy or other test to r/o gastroparesis  Ulcerative colitis on steroids Was on JAK inhibitor ( not anymore) Also on TNFI  Discussed the management with patient and ID pharmacist

## 2024-08-13 DIAGNOSIS — R1110 Vomiting, unspecified: Secondary | ICD-10-CM | POA: Diagnosis not present

## 2024-08-13 LAB — BASIC METABOLIC PANEL WITH GFR
Anion gap: 10 (ref 5–15)
BUN: 7 mg/dL — ABNORMAL LOW (ref 8–23)
CO2: 25 mmol/L (ref 22–32)
Calcium: 8.3 mg/dL — ABNORMAL LOW (ref 8.9–10.3)
Chloride: 99 mmol/L (ref 98–111)
Creatinine, Ser: 0.48 mg/dL (ref 0.44–1.00)
GFR, Estimated: >60 mL/min (ref >60)
Glucose, Bld: 54 mg/dL — ABNORMAL LOW (ref 70–99)
Potassium: 3.7 mmol/L (ref 3.5–5.1)
Sodium: 134 mmol/L — ABNORMAL LOW (ref 135–145)

## 2024-08-13 LAB — GLUCOSE, CAPILLARY
Glucose-Capillary: 122 mg/dL — ABNORMAL HIGH (ref 70–99)
Glucose-Capillary: 122 mg/dL — ABNORMAL HIGH (ref 70–99)
Glucose-Capillary: 179 mg/dL — ABNORMAL HIGH (ref 70–99)
Glucose-Capillary: 191 mg/dL — ABNORMAL HIGH (ref 70–99)
Glucose-Capillary: 246 mg/dL — ABNORMAL HIGH (ref 70–99)
Glucose-Capillary: 276 mg/dL — ABNORMAL HIGH (ref 70–99)
Glucose-Capillary: 58 mg/dL — ABNORMAL LOW (ref 70–99)
Glucose-Capillary: 83 mg/dL (ref 70–99)

## 2024-08-13 LAB — CBC
HCT: 25.3 % — ABNORMAL LOW (ref 36.0–46.0)
Hemoglobin: 7.6 g/dL — ABNORMAL LOW (ref 12.0–15.0)
MCH: 25.6 pg — ABNORMAL LOW (ref 26.0–34.0)
MCHC: 30 g/dL (ref 30.0–36.0)
MCV: 85.2 fL (ref 80.0–100.0)
Platelets: 384 K/uL (ref 150–400)
RBC: 2.97 MIL/uL — ABNORMAL LOW (ref 3.87–5.11)
RDW: 17.4 % — ABNORMAL HIGH (ref 11.5–15.5)
WBC: 12.6 K/uL — ABNORMAL HIGH (ref 4.0–10.5)
nRBC: 0.2 % (ref 0.0–0.2)

## 2024-08-13 LAB — HSV 1/2 PCR (SURFACE)
HSV-1 DNA: NOT DETECTED
HSV-2 DNA: NOT DETECTED

## 2024-08-13 LAB — PHOSPHORUS: Phosphorus: 2.6 mg/dL (ref 2.5–4.6)

## 2024-08-13 LAB — MAGNESIUM: Magnesium: 2.4 mg/dL (ref 1.7–2.4)

## 2024-08-13 MED ORDER — VALACYCLOVIR HCL 500 MG PO TABS: 500.0000 mg | ORAL_TABLET | Freq: Two times a day (BID) | ORAL | Status: DC

## 2024-08-13 MED ORDER — VALACYCLOVIR HCL 500 MG PO TABS: 500.0000 mg | ORAL_TABLET | Freq: Every day | ORAL | Status: DC

## 2024-08-13 MED ORDER — HYOSCYAMINE SULFATE 0.125 MG SL SUBL
0.1250 mg | SUBLINGUAL_TABLET | SUBLINGUAL | Status: DC | PRN
  Administered 2024-08-14 – 2024-08-15 (×3): 0.125 mg via SUBLINGUAL
  Filled 2024-08-13 (×5): qty 1

## 2024-08-13 MED ORDER — VALACYCLOVIR HCL 500 MG PO TABS
1000.0000 mg | ORAL_TABLET | Freq: Three times a day (TID) | ORAL | Status: DC
  Administered 2024-08-13 – 2024-08-15 (×5): 1000 mg via ORAL
  Filled 2024-08-13 (×6): qty 2

## 2024-08-13 MED ORDER — INSULIN ASPART 100 UNIT/ML IJ SOLN
0.0000 [IU] | Freq: Three times a day (TID) | INTRAMUSCULAR | Status: DC
  Administered 2024-08-13: 3 [IU] via SUBCUTANEOUS
  Administered 2024-08-14: 1 [IU] via SUBCUTANEOUS

## 2024-08-13 NOTE — H&P (View-Only) (Signed)
 Subjective: She complains about regurgitation and some abdominal cramping.  Objective: Vital signs in last 24 hours: Temp:  [97.8 F (36.6 C)-98.5 F (36.9 C)] 98.5 F (36.9 C) (09/23 1351) Pulse Rate:  [68-80] 79 (09/23 1351) Resp:  [16-18] 16 (09/23 1351) BP: (130-141)/(73-88) 130/73 (09/23 1351) SpO2:  [95 %-97 %] 96 % (09/23 1351) Last BM Date : 08/12/24  Intake/Output from previous day: 09/22 0701 - 09/23 0700 In: 4056.9 [P.O.:1297; I.V.:2228.1; IV Piggyback:531.8] Out: -  Intake/Output this shift: Total I/O In: 2681.8 [P.O.:1450; I.V.:966.8; IV Piggyback:265] Out: -   General appearance: alert and no distress GI: soft, non-tender; bowel sounds normal; no masses,  no organomegaly  Lab Results: Recent Labs    08/11/24 0409 08/12/24 0314 08/13/24 0356  WBC 23.0* 17.6* 12.6*  HGB 7.9* 8.2* 7.6*  HCT 25.1* 26.1* 25.3*  PLT 430* 380 384   BMET Recent Labs    08/11/24 0409 08/12/24 0314 08/13/24 0356  NA 136 135 134*  K 3.6 3.7 3.7  CL 100 98 99  CO2 25 26 25   GLUCOSE 85 71 54*  BUN 10 7* 7*  CREATININE 0.50 0.48 0.48  CALCIUM  8.2* 8.8* 8.3*   LFT Recent Labs    08/12/24 0314  PROT 5.2*  ALBUMIN 2.7*  AST <10*  ALT 16  ALKPHOS 70  BILITOT 0.3   PT/INR No results for input(s): LABPROT, INR in the last 72 hours. Hepatitis Panel No results for input(s): HEPBSAG, HCVAB, HEPAIGM, HEPBIGM in the last 72 hours. C-Diff No results for input(s): CDIFFTOX in the last 72 hours. Fecal Lactopherrin No results for input(s): FECLLACTOFRN in the last 72 hours.  Studies/Results: No results found.  Medications: Scheduled:  atorvastatin   40 mg Oral QHS   enoxaparin  (LOVENOX ) injection  40 mg Subcutaneous Q24H   feeding supplement (GLUCERNA SHAKE)  237 mL Oral TID BM   ferrous sulfate   325 mg Oral QODAY   insulin  aspart  0-6 Units Subcutaneous TID WC   pantoprazole   40 mg Oral Daily   predniSONE   40 mg Oral Q breakfast   valACYclovir    1,000 mg Oral TID   [START ON 08/17/2024] valACYclovir   500 mg Oral Daily   Continuous:  Assessment/Plan: 1) Severe UC flare - controlled with infliximab  10 mg/kg and maintained on prednisone  40 mg every day. 2) Nausea and regurgitation. 3) Anemia. 4) Hyperglycemia. 5) Gluteal cleft rash -? Pyoderma gangranosum.   As for her UC symptoms, she reports formed and nonbloody bowel movements.  This appears to be improving.  The recent CT scan on 08/11/2024 did not show a change, however, it will take time for improvement.  Her main issue is the nausea and regurgitation with solid food PO intake.  She had this problem at home and as a result she lost weight.  PO intake will induce a cramping in her abdomen and this is very uncomfortable for her.  Her use of narcotics is still present, but she did decrease the frequency of use.  Today she did not use any pain medications.  Gastroparesis is a possibility with her hyperglycemia from the prednisone .  As for her gluteal cleft rash, there does not appear to be any change.  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  A Derm consult will be helpful.  Plan: 1) EGD tomorrow. 2) Trail of Levsin  q4 hours PRN. 3) Maintain prednisone . 4) Continue with antiviral per ID. 5) Pending the EGD, a GES maybe ordered.  LOS: 8 days   Ravon Mcilhenny  D 08/13/2024, 5:17 PM

## 2024-08-13 NOTE — Progress Notes (Signed)
 Date of Admission:  08/05/2024      ID: Nancy Eaton is a 68 y.o. female Principal Problem:   Intractable vomiting Active Problems:   Herpes zoster without complication   Herpes simplex    Subjective: Feeling better But still afraid to eat as it will set off pain   Medications:   atorvastatin   40 mg Oral QHS   enoxaparin  (LOVENOX ) injection  40 mg Subcutaneous Q24H   feeding supplement (GLUCERNA SHAKE)  237 mL Oral TID BM   ferrous sulfate   325 mg Oral QODAY   insulin  aspart  0-6 Units Subcutaneous TID WC   pantoprazole   40 mg Oral Daily   predniSONE   40 mg Oral Q breakfast   valACYclovir   1,000 mg Oral TID   [START ON 08/17/2024] valACYclovir   500 mg Oral Daily    Objective: Vital signs in last 24 hours: Patient Vitals for the past 24 hrs:  BP Temp Temp src Pulse Resp SpO2  08/13/24 1351 130/73 98.5 F (36.9 C) Oral 79 16 96 %  08/13/24 0544 139/73 97.8 F (36.6 C) Oral 80 18 97 %  08/12/24 2122 (!) 141/88 98.1 F (36.7 C) Oral 68 18 95 %       PHYSICAL EXAM:  General: Alert, cooperative, no distress now , appears stated age.  Lungs: Clear to auscultation bilaterally. No Wheezing or Rhonchi. No rales. Heart: Regular rate and rhythm, no murmur, rub or gallop. Abdomen: Soft, non-tender,not distended. Bowel sounds normal. No masses Back- intergulteal and b/l buttock punched out lesions Took another swab afetr moistening the lesions with saline and scrubbing it well Extremities: atraumatic, no cyanosis. No edema. No clubbing Skin: No rashes or lesions. Or bruising Lymph: Cervical, supraclavicular normal. Neurologic: Grossly non-focal Rt TKA  Lab Results    Latest Ref Rng & Units 08/13/2024    3:56 AM 08/12/2024    3:14 AM 08/11/2024    4:09 AM  CBC  WBC 4.0 - 10.5 K/uL 12.6  17.6  23.0   Hemoglobin 12.0 - 15.0 g/dL 7.6  8.2  7.9   Hematocrit 36.0 - 46.0 % 25.3  26.1  25.1   Platelets 150 - 400 K/uL 384  380  430        Latest Ref Rng & Units  08/13/2024    3:56 AM 08/12/2024    3:14 AM 08/11/2024    4:09 AM  CMP  Glucose 70 - 99 mg/dL 54  71  85   BUN 8 - 23 mg/dL 7  7  10    Creatinine 0.44 - 1.00 mg/dL 9.51  9.51  9.49   Sodium 135 - 145 mmol/L 134  135  136   Potassium 3.5 - 5.1 mmol/L 3.7  3.7  3.6   Chloride 98 - 111 mmol/L 99  98  100   CO2 22 - 32 mmol/L 25  26  25    Calcium  8.9 - 10.3 mg/dL 8.3  8.8  8.2   Total Protein 6.5 - 8.1 g/dL  5.2  4.7   Total Bilirubin 0.0 - 1.2 mg/dL  0.3  0.3   Alkaline Phos 38 - 126 U/L  70  68   AST 15 - 41 U/L  <10  12   ALT 0 - 44 U/L  16  16       Microbiology: HSV culture neg VZV pcr neg Studies/Results: No results found.    Assessment/Plan: Lesions intergluteal and buttock area- Herpes simplex VS VZZ Pt on acyclovir   She is immune compromised with TNFI and recived a dose in the hospital on 9/16 which can make the viral lesions worse So far HSV PCR, VZV pCR and HSV culture neg I sent a moistened scrubbed swab today If that is also neg we may consider skin biopsy if not healing They dont look like skin lesions of ulcerative colitis Will switch her to valtrex  1 gram PO BID to see whether she would tolerate- may crush and mix with apple sauce or may do syrup if she does not tolerate it  total of 10 days  10 days until 9/28 and then reduce to 500mg  every day for a long period  Vomiting and abdominal pain due to the UC Recommend upper GI endoscopy or other test to r/o gastroparesis   Ulcerative colitis on steroids- if she is going to continue prednisone  at 40mg  or more for a proloned period she will need PCP prophylaxis with bactrim Will let GI /promary team decide on that Was on JAK inhibitor ( not anymore) Also on TNFI  Discussed the management with patient and ID pharmacist

## 2024-08-13 NOTE — Progress Notes (Signed)
 PROGRESS NOTE    Nancy Eaton  FMW:[id number] DOB: 12-Mar-1956 DOA: 08/05/2024 PCP: Christopher Arland PARAS, PA-C   Brief Narrative:  This 68 y.o. female with medical history significant for prediabetes, ulcerative colitis, hypertension, hyperlipidemia recently discharged from [facility] after ulcerative colitis flare on 07/19/24 now returns with dehydration, abdominal cramping and vomiting.  Found to have another ulcerative colitis flare and initially there was concern that her symptoms were related to acyclovir  As she has active herpes zoster so she will be placed back on IV acyclovir  as it is more of an intolerance than allergy.  This hospitalization She received a dose of Inflectra  and Rinvoq  has been discontinued and her Steroids have been changed from IV to po.  She is tolerating soft diet but ID is recommending continue IV acyclovir  for now monitoring her creatinine while we await VZV and HSV PCR.   Assessment & Plan:   Principal Problem:   Intractable vomiting Active Problems:   Herpes zoster without complication   Herpes simplex   Nausea and vomiting in the setting of ulcerative colitis:   She presented with abdominal discomfort, loose stools, Nausea and vomiting. Initially thought to be in setting of valacyclovir  but likely Flare.  She was started on soft diet but then started having abdominal symptoms. Repeat CT A/P;  Findings  most likely infective/inflammatory Colitis in etiology.  C/w Supportive care, Resumed IV Acyclovir . Continue IV Zofran  as needed for nausea and vomiting. ID recommends continuing pending the varicella zoster and HSV PCR pending;  GI had signed off but re-consulted given persistent symptoms. Dr. Rollin notified.   Hyponatremia: Likely hypovolemic hyponatremia. Improved with IV hydration.   Hypophosphatemia:  Replaced.  Resolved  Buttock Rash in the setting of Active Herpes Zoster :  Consulted WOC nurse; Rash is Vesicular   ID consulted and Transferred her  to Negative Pressure Room w/ Airborne and Contact Precautions.  Getting IV Acyclovir  now and will be continued.  Awaiting Varicella Zoster PCR , HSV PCR and this is still pending;  ID is tentatively planning to switch to p.o. Valacyclovir  for discharge.   Ulcerative colitis: She was admitted to the hospital with recent flare, improved with IV steroids and initiation of Rinvoq .   GI Dr. Rollin at the time of admission, who did not feel that imaging was necessary at this point in time. Continue Prednisone  40 mg p.o. daily -Rinvoq  changed to Infliximab  by GI as she receives this 10 mg/kg infusion at 10 AM on 9/16 with improvement in her symptoms.  She was approved for 10 mg/kg infliximab  as an outpatient . Plan is to place her on this medication every 4 weeks per GI and they are recommending a repeat trough and antibody level to be obtained.  She is also amenable to start AZA at this time and will be initiated on this in the outpatient setting Abdominal discomfort and loose stools were improving and she was tolerating soft diet without issues. She again developed abdominal discomfort.  GI reconsulted.   Essential Hypertension:  Blood pressure is normal.  Continue to monitor.  Type 2 Diabetes Mellitus:  HbA1c 6.7, diet controlled.   Hypoglycemia: She was started on sliding scale due to being on prednisone . Sliding scale switched to very sensitive.  Chronic Iron  Deficiency Anemia:  H&H relatively stable. No signs of any active bleeding.   Continue to monitor hemoglobin.   Chronic Thrombocytosis: Stable. Resolved.  GERD/GI Prophylaxis:  Continue pantoprazole  40 mg daily.   Hypoalbuminemia:  CTM and Trend  and repeat CMP in the AM.   Overweight: Complicates overall prognosis and care.  Estimated body mass index is 29.8 kg/m as calculated from the following:   Height as of this encounter: 5' 2.5 (1.588 m).   Weight as of this encounter: 75.1 kg. Weight Loss and Dietary Counseling  given   DVT prophylaxis: Lovenox  Code Status: Full code Family Communication: No family at bed side Disposition Plan:  Status is: Inpatient Remains inpatient appropriate because: Severity of illness.    Consultants:  GI ID  Procedures:   Antimicrobials:  Anti-infectives (From admission, onward)    Start     Dose/Rate Route Frequency Ordered Stop   08/07/24 1400  acyclovir  (ZOVIRAX ) 750 mg in dextrose  5 % 250 mL IVPB        10 mg/kg  75.1 kg 265 mL/hr over 60 Minutes Intravenous Every 8 hours 08/07/24 1141     08/07/24 1000  acyclovir  (ZOVIRAX ) tablet 800 mg  Status:  Discontinued        800 mg Oral 5 times daily 08/07/24 0831 08/07/24 0839      Subjective: Patient seen and examined at bedside.  Overnight events noted. Patient reports feeling better, still reports having abdominal discomfort, Nausea and vomiting has improved.  Her blood glucose been low in am.  Objective: Vitals:   08/12/24 0824 08/12/24 1352 08/12/24 2122 08/13/24 0544  BP: (!) 141/73 132/82 (!) 141/88 139/73  Pulse: 79 80 68 80  Resp: 17 15 18 18   Temp:  98.6 F (37 C) 98.1 F (36.7 C) 97.8 F (36.6 C)  TempSrc:  Oral Oral Oral  SpO2: 97% 98% 95% 97%  Weight:      Height:        Intake/Output Summary (Last 24 hours) at 08/13/2024 1306 Last data filed at 08/13/2024 1100 Gross per 24 hour  Intake 3553.7 ml  Output --  Net 3553.7 ml   Filed Weights   08/06/24 0857  Weight: 75.1 kg    Examination:  General exam: Appears calm and comfortable, not in any acute distress. Respiratory system: CTA Bilaterally. Respiratory effort normal.  RR 13 Cardiovascular system: S1 & S2 heard, RRR. No JVD, murmurs, rubs, gallops or clicks. Gastrointestinal system: Abdomen is non distended, soft and non tender. Normal bowel sounds heard. Central nervous system: Alert and oriented x 3. No focal neurological deficits. Extremities: No edema, no cyanosis, no clubbing. Skin: No rashes, lesions or  ulcers Psychiatry: Judgement and insight appear normal. Mood & affect appropriate.     Data Reviewed: I have personally reviewed following labs and imaging studies  CBC: Recent Labs  Lab 08/08/24 0415 08/09/24 0345 08/10/24 0329 08/11/24 0409 08/12/24 0314 08/13/24 0356  WBC 11.6* 10.5 15.1* 23.0* 17.6* 12.6*  NEUTROABS 6.7 6.4 9.4* 17.2* 12.9*  --   HGB 8.7* 7.7* 7.6* 7.9* 8.2* 7.6*  HCT 27.7* 25.2* 24.4* 25.1* 26.1* 25.3*  MCV 83.4 83.2 83.8 83.9 83.9 85.2  PLT 462* 434* 432* 430* 380 384   Basic Metabolic Panel: Recent Labs  Lab 08/09/24 0345 08/10/24 0329 08/11/24 0409 08/12/24 0314 08/13/24 0356  NA 136 135 136 135 134*  K 4.1 4.3 3.6 3.7 3.7  CL 100 100 100 98 99  CO2 25 27 25 26 25   GLUCOSE 109* 106* 85 71 54*  BUN 9 13 10  7* 7*  CREATININE 0.49 0.73 0.50 0.48 0.48  CALCIUM  8.4* 8.5* 8.2* 8.8* 8.3*  MG 2.5* 2.3 2.2 2.4 2.4  PHOS 2.4* 2.1* 3.2 3.8  2.6   GFR: Estimated Creatinine Clearance: 63.7 mL/min (by C-G formula based on SCr of 0.48 mg/dL). Liver Function Tests: Recent Labs  Lab 08/08/24 0415 08/09/24 0345 08/10/24 0329 08/11/24 0409 08/12/24 0314  AST <10* <10* 13* 12* <10*  ALT 15 15 16 16 16   ALKPHOS 71 74 76 68 70  BILITOT 0.3 <0.2 <0.2 0.3 0.3  PROT 5.1* 4.9* 4.9* 4.7* 5.2*  ALBUMIN 2.7* 2.6* 2.7* 2.6* 2.7*   No results for input(s): LIPASE, AMYLASE in the last 168 hours. No results for input(s): AMMONIA in the last 168 hours. Coagulation Profile: No results for input(s): INR, PROTIME in the last 168 hours. Cardiac Enzymes: No results for input(s): CKTOTAL, CKMB, CKMBINDEX, TROPONINI in the last 168 hours. BNP (last 3 results) No results for input(s): PROBNP in the last 8760 hours. HbA1C: No results for input(s): HGBA1C in the last 72 hours. CBG: Recent Labs  Lab 08/13/24 0008 08/13/24 0411 08/13/24 0449 08/13/24 0735 08/13/24 1124  GLUCAP 122* 58* 122* 83 179*   Lipid Profile: No results for  input(s): CHOL, HDL, LDLCALC, TRIG, CHOLHDL, LDLDIRECT in the last 72 hours. Thyroid Function Tests: No results for input(s): TSH, T4TOTAL, FREET4, T3FREE, THYROIDAB in the last 72 hours. Anemia Panel: Recent Labs    08/11/24 0409  VITAMINB12 1,068*  FOLATE 11.3  FERRITIN 142  TIBC 169*  IRON  19*  RETICCTPCT 1.7   Sepsis Labs: No results for input(s): PROCALCITON, LATICACIDVEN in the last 168 hours.  Recent Results (from the past 240 hours)  Varicella-zoster by PCR     Status: None   Collection Time: 08/07/24  3:03 PM   Specimen: Skin, Other; Sterile Swab  Result Value Ref Range Status   Varicella-Zoster, PCR Negative Negative Final    Comment: (NOTE) No Varicella Zoster Virus DNA detected. This test was developed and its performance characteristics determined by [facility]. It has not been cleared or approved by the Food and Drug Administration. Performed At: [facility] [address] [place], [place] [id number] Jennette Shorter MD Ey:[id number]   Hsv Culture And Typing     Status: None   Collection Time: 08/07/24  3:03 PM   Specimen: Skin, Other  Result Value Ref Range Status   HSV Culture/Type Comment  Final    Comment: (NOTE) Negative No Herpes simplex virus isolated. Performed At: [facility] [address] [place], [place] [id number] Jennette Shorter MD Ey:[id number]    Source of Sample NASOPHARYNGEAL SWAB  Final    Comment: Performed at [facility], [address], [place], [place] [address]    Radiology Studies: No results found.  Scheduled Meds:  atorvastatin   40 mg Oral QHS   enoxaparin  (LOVENOX ) injection  40 mg Subcutaneous Q24H   feeding supplement (GLUCERNA SHAKE)  237 mL Oral TID BM   ferrous sulfate   325 mg Oral QODAY   insulin  aspart  0-6 Units Subcutaneous TID WC   pantoprazole   40 mg Oral Daily   predniSONE   40 mg Oral Q breakfast   Continuous Infusions:  sodium chloride  100 mL/hr  at 08/13/24 1136   acyclovir  750 mg (08/13/24 1305)     LOS: 8 days    Time spent: 50 mins    Darcel Dawley, MD [facility]   If 7PM-7AM, please contact night-coverage

## 2024-08-13 NOTE — Progress Notes (Signed)
 Subjective: She complains about regurgitation and some abdominal cramping.  Objective: Vital signs in last 24 hours: Temp:  [97.8 F (36.6 C)-98.5 F (36.9 C)] 98.5 F (36.9 C) (09/23 1351) Pulse Rate:  [68-80] 79 (09/23 1351) Resp:  [16-18] 16 (09/23 1351) BP: (130-141)/(73-88) 130/73 (09/23 1351) SpO2:  [95 %-97 %] 96 % (09/23 1351) Last BM Date : 08/12/24  Intake/Output from previous day: 09/22 0701 - 09/23 0700 In: 4056.9 [P.O.:1297; I.V.:2228.1; IV Piggyback:531.8] Out: -  Intake/Output this shift: Total I/O In: 2681.8 [P.O.:1450; I.V.:966.8; IV Piggyback:265] Out: -   General appearance: alert and no distress GI: soft, non-tender; bowel sounds normal; no masses,  no organomegaly  Lab Results: Recent Labs    08/11/24 0409 08/12/24 0314 08/13/24 0356  WBC 23.0* 17.6* 12.6*  HGB 7.9* 8.2* 7.6*  HCT 25.1* 26.1* 25.3*  PLT 430* 380 384   BMET Recent Labs    08/11/24 0409 08/12/24 0314 08/13/24 0356  NA 136 135 134*  K 3.6 3.7 3.7  CL 100 98 99  CO2 25 26 25   GLUCOSE 85 71 54*  BUN 10 7* 7*  CREATININE 0.50 0.48 0.48  CALCIUM  8.2* 8.8* 8.3*   LFT Recent Labs    08/12/24 0314  PROT 5.2*  ALBUMIN 2.7*  AST <10*  ALT 16  ALKPHOS 70  BILITOT 0.3   PT/INR No results for input(s): LABPROT, INR in the last 72 hours. Hepatitis Panel No results for input(s): HEPBSAG, HCVAB, HEPAIGM, HEPBIGM in the last 72 hours. C-Diff No results for input(s): CDIFFTOX in the last 72 hours. Fecal Lactopherrin No results for input(s): FECLLACTOFRN in the last 72 hours.  Studies/Results: No results found.  Medications: Scheduled:  atorvastatin   40 mg Oral QHS   enoxaparin  (LOVENOX ) injection  40 mg Subcutaneous Q24H   feeding supplement (GLUCERNA SHAKE)  237 mL Oral TID BM   ferrous sulfate   325 mg Oral QODAY   insulin  aspart  0-6 Units Subcutaneous TID WC   pantoprazole   40 mg Oral Daily   predniSONE   40 mg Oral Q breakfast   valACYclovir    1,000 mg Oral TID   [START ON 08/17/2024] valACYclovir   500 mg Oral Daily   Continuous:  Assessment/Plan: 1) Severe UC flare - controlled with infliximab  10 mg/kg and maintained on prednisone  40 mg every day. 2) Nausea and regurgitation. 3) Anemia. 4) Hyperglycemia. 5) Gluteal cleft rash -? Pyoderma gangranosum.   As for her UC symptoms, she reports formed and nonbloody bowel movements.  This appears to be improving.  The recent CT scan on 08/11/2024 did not show a change, however, it will take time for improvement.  Her main issue is the nausea and regurgitation with solid food PO intake.  She had this problem at home and as a result she lost weight.  PO intake will induce a cramping in her abdomen and this is very uncomfortable for her.  Her use of narcotics is still present, but she did decrease the frequency of use.  Today she did not use any pain medications.  Gastroparesis is a possibility with her hyperglycemia from the prednisone .  As for her gluteal cleft rash, there does not appear to be any change.  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  A Derm consult will be helpful.  Plan: 1) EGD tomorrow. 2) Trail of Levsin  q4 hours PRN. 3) Maintain prednisone . 4) Continue with antiviral per ID. 5) Pending the EGD, a GES maybe ordered.  LOS: 8 days   Ravon Mcilhenny  D 08/13/2024, 5:17 PM

## 2024-08-13 NOTE — Plan of Care (Signed)
   Problem: Coping: Goal: Level of anxiety will decrease Outcome: Progressing   Problem: Pain Managment: Goal: General experience of comfort will improve and/or be controlled Outcome: Progressing

## 2024-08-13 NOTE — Plan of Care (Signed)
  Problem: Education: Goal: Knowledge of General Education information will improve Description: Including pain rating scale, medication(s)/side effects and non-pharmacologic comfort measures Outcome: Progressing   Problem: Health Behavior/Discharge Planning: Goal: Ability to manage health-related needs will improve Outcome: Progressing   Problem: Clinical Measurements: Goal: Ability to maintain clinical measurements within normal limits will improve Outcome: Progressing Goal: Will remain free from infection Outcome: Progressing Goal: Diagnostic test results will improve Outcome: Progressing Goal: Respiratory complications will improve Outcome: Progressing   Problem: Activity: Goal: Risk for activity intolerance will decrease Outcome: Progressing   Problem: Nutrition: Goal: Adequate nutrition will be maintained Outcome: Progressing   Problem: Clinical Measurements: Goal: Ability to maintain clinical measurements within normal limits will improve Outcome: Progressing   Problem: Clinical Measurements: Goal: Will remain free from infection Outcome: Progressing

## 2024-08-13 NOTE — Progress Notes (Signed)
 Hypoglycemic Event  CBG: 58  Treatment: 4 oz juice/soda  Symptoms: None  Follow-up CBG: Time: 0440 CBG Result: 122  Possible Reasons for Event: Inadequate meal intake  Comments/MD notified: message sent via secure chat to provider on-call STEWARD Kipper, NP)    Tawni Pouch

## 2024-08-14 ENCOUNTER — Encounter (HOSPITAL_COMMUNITY)
Admission: EM | Disposition: A | Discharge status: Home / Self Care | Payer: Self-pay | Source: Home / Self Care | Attending: Internal Medicine

## 2024-08-14 ENCOUNTER — Encounter (HOSPITAL_COMMUNITY): Payer: Self-pay | Admitting: Internal Medicine

## 2024-08-14 ENCOUNTER — Inpatient Hospital Stay (HOSPITAL_COMMUNITY): Admitting: Certified Registered Nurse Anesthetist

## 2024-08-14 DIAGNOSIS — E860 Dehydration: Secondary | ICD-10-CM

## 2024-08-14 DIAGNOSIS — R1084 Generalized abdominal pain: Secondary | ICD-10-CM | POA: Diagnosis not present

## 2024-08-14 DIAGNOSIS — K219 Gastro-esophageal reflux disease without esophagitis: Secondary | ICD-10-CM

## 2024-08-14 DIAGNOSIS — I10 Essential (primary) hypertension: Secondary | ICD-10-CM | POA: Diagnosis not present

## 2024-08-14 DIAGNOSIS — E871 Hypo-osmolality and hyponatremia: Secondary | ICD-10-CM | POA: Diagnosis not present

## 2024-08-14 DIAGNOSIS — B009 Herpesviral infection, unspecified: Secondary | ICD-10-CM

## 2024-08-14 DIAGNOSIS — R1110 Vomiting, unspecified: Secondary | ICD-10-CM | POA: Diagnosis not present

## 2024-08-14 HISTORY — PX: ESOPHAGOGASTRODUODENOSCOPY: SHX5428

## 2024-08-14 LAB — CBC
HCT: 26.4 % — ABNORMAL LOW (ref 36.0–46.0)
Hemoglobin: 8 g/dL — ABNORMAL LOW (ref 12.0–15.0)
MCH: 25.8 pg — ABNORMAL LOW (ref 26.0–34.0)
MCHC: 30.3 g/dL (ref 30.0–36.0)
MCV: 85.2 fL (ref 80.0–100.0)
Platelets: 398 K/uL (ref 150–400)
RBC: 3.1 MIL/uL — ABNORMAL LOW (ref 3.87–5.11)
RDW: 17.9 % — ABNORMAL HIGH (ref 11.5–15.5)
WBC: 11.7 K/uL — ABNORMAL HIGH (ref 4.0–10.5)
nRBC: 0.3 % — ABNORMAL HIGH (ref 0.0–0.2)

## 2024-08-14 LAB — BASIC METABOLIC PANEL WITH GFR
Anion gap: 10 (ref 5–15)
BUN: 10 mg/dL (ref 8–23)
CO2: 27 mmol/L (ref 22–32)
Calcium: 8.5 mg/dL — ABNORMAL LOW (ref 8.9–10.3)
Chloride: 99 mmol/L (ref 98–111)
Creatinine, Ser: 0.5 mg/dL (ref 0.44–1.00)
GFR, Estimated: >60 mL/min (ref >60)
Glucose, Bld: 104 mg/dL — ABNORMAL HIGH (ref 70–99)
Potassium: 4.2 mmol/L (ref 3.5–5.1)
Sodium: 136 mmol/L (ref 135–145)

## 2024-08-14 LAB — GLUCOSE, CAPILLARY
Glucose-Capillary: 79 mg/dL (ref 70–99)
Glucose-Capillary: 100 mg/dL — ABNORMAL HIGH (ref 70–99)
Glucose-Capillary: 169 mg/dL — ABNORMAL HIGH (ref 70–99)
Glucose-Capillary: 189 mg/dL — ABNORMAL HIGH (ref 70–99)
Glucose-Capillary: 198 mg/dL — ABNORMAL HIGH (ref 70–99)

## 2024-08-14 LAB — HSV 1/2 PCR (SURFACE)
HSV-1 DNA: NOT DETECTED
HSV-2 DNA: DETECTED — AB

## 2024-08-14 LAB — RPR: RPR Ser Ql: NONREACTIVE

## 2024-08-14 LAB — VARICELLA-ZOSTER BY PCR: Varicella-Zoster, PCR: NEGATIVE

## 2024-08-14 LAB — HIV ANTIBODY (ROUTINE TESTING W REFLEX): HIV Screen 4th Generation wRfx: NONREACTIVE

## 2024-08-14 SURGERY — EGD (ESOPHAGOGASTRODUODENOSCOPY)
Anesthesia: Monitor Anesthesia Care

## 2024-08-14 MED ORDER — PROPOFOL 10 MG/ML IV BOLUS
INTRAVENOUS | Status: DC | PRN
  Administered 2024-08-14: 150 mg via INTRAVENOUS
  Administered 2024-08-14: 50 mg via INTRAVENOUS

## 2024-08-14 MED ORDER — SODIUM CHLORIDE 0.9 % IV SOLN: INTRAVENOUS | Status: DC | PRN

## 2024-08-14 MED ORDER — LIDOCAINE 2% (20 MG/ML) 5 ML SYRINGE
INTRAMUSCULAR | Status: DC | PRN
  Administered 2024-08-14: 40 mg via INTRAVENOUS

## 2024-08-14 MED ORDER — SODIUM CHLORIDE 0.9 % IV SOLN: INTRAVENOUS | Status: DC

## 2024-08-14 NOTE — Plan of Care (Signed)
   Problem: Coping: Goal: Level of anxiety will decrease Outcome: Progressing   Problem: Pain Managment: Goal: General experience of comfort will improve and/or be controlled Outcome: Progressing   Problem: Safety: Goal: Ability to remain free from injury will improve Outcome: Progressing

## 2024-08-14 NOTE — Transfer of Care (Signed)
 Immediate Anesthesia Transfer of Care Note  Patient: Nancy Eaton  Procedure(s) Performed: EGD (ESOPHAGOGASTRODUODENOSCOPY)  Patient Location: Endoscopy Unit  Anesthesia Type:MAC  Level of Consciousness: awake, alert , oriented, and patient cooperative  Airway & Oxygen Therapy: Patient Spontanous Breathing and Patient connected to face mask oxygen  Post-op Assessment: Report given to RN and Post -op Vital signs reviewed and stable  Post vital signs: Reviewed and stable  Last Vitals:  Vitals Value Taken Time  BP 99/56 08/14/24 15:10  Temp 36.7 C 08/14/24 15:10  Pulse    Resp 18 08/14/24 15:10  SpO2 100 % 08/14/24 15:10    Last Pain:  Vitals:   08/14/24 1510  TempSrc: Temporal  PainSc: 0-No pain      Patients Stated Pain Goal: 0 (08/14/24 1510)  Complications: There were no known notable events for this encounter.

## 2024-08-14 NOTE — Progress Notes (Signed)
 Date of Admission:  08/05/2024      ID: Nancy Eaton is a 68 y.o. female Principal Problem:   Intractable vomiting Active Problems:   Herpes zoster without complication   Herpes simplex    Subjective: Still has some abdominal cramps Was able to tolerate the valciclovir oral med   Medications:   atorvastatin   40 mg Oral QHS   enoxaparin  (LOVENOX ) injection  40 mg Subcutaneous Q24H   feeding supplement (GLUCERNA SHAKE)  237 mL Oral TID BM   ferrous sulfate   325 mg Oral QODAY   insulin  aspart  0-6 Units Subcutaneous TID WC   pantoprazole   40 mg Oral Daily   predniSONE   40 mg Oral Q breakfast   valACYclovir   1,000 mg Oral TID   [START ON 08/17/2024] valACYclovir   500 mg Oral Daily    Objective: Vital signs in last 24 hours: Patient Vitals for the past 24 hrs:  BP Temp Temp src Pulse Resp SpO2  08/14/24 0725 139/73 98.1 F (36.7 C) Oral 72 18 98 %  08/13/24 2058 (!) 154/85 98.3 F (36.8 C) Oral 72 18 97 %  08/13/24 1351 130/73 98.5 F (36.9 C) Oral 79 16 96 %       PHYSICAL EXAM:  General: Alert, cooperative, no distress now , appears stated age.  Lungs: Clear to auscultation bilaterally. No Wheezing or Rhonchi. No rales. Heart: Regular rate and rhythm, no murmur, rub or gallop. Abdomen: Soft, non-tender,not distended. Bowel sounds normal. No masses Back- intergulteal and b/l buttock punched out lesions Took another swab afetr moistening the lesions with saline and scrubbing it well Extremities: atraumatic, no cyanosis. No edema. No clubbing Skin: No rashes or lesions. Or bruising Lymph: Cervical, supraclavicular normal. Neurologic: Grossly non-focal Rt TKA  Lab Results    Latest Ref Rng & Units 08/14/2024    3:58 AM 08/13/2024    3:56 AM 08/12/2024    3:14 AM  CBC  WBC 4.0 - 10.5 K/uL 11.7  12.6  17.6   Hemoglobin 12.0 - 15.0 g/dL 8.0  7.6  8.2   Hematocrit 36.0 - 46.0 % 26.4  25.3  26.1   Platelets 150 - 400 K/uL 398  384  380        Latest Ref  Rng & Units 08/14/2024    3:58 AM 08/13/2024    3:56 AM 08/12/2024    3:14 AM  CMP  Glucose 70 - 99 mg/dL 895  54  71   BUN 8 - 23 mg/dL 10  7  7    Creatinine 0.44 - 1.00 mg/dL 9.49  9.51  9.51   Sodium 135 - 145 mmol/L 136  134  135   Potassium 3.5 - 5.1 mmol/L 4.2  3.7  3.7   Chloride 98 - 111 mmol/L 99  99  98   CO2 22 - 32 mmol/L 27  25  26    Calcium  8.9 - 10.3 mg/dL 8.5  8.3  8.8   Total Protein 6.5 - 8.1 g/dL   5.2   Total Bilirubin 0.0 - 1.2 mg/dL   0.3   Alkaline Phos 38 - 126 U/L   70   AST 15 - 41 U/L   <10   ALT 0 - 44 U/L   16       Microbiology: HSV culture neg VZV pcr neg Studies/Results: No results found.    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 Confirmed  by a 2nd swab sent yesterday VZV NEG So not shingles She is immune compromised with TNFI and recived a dose in the hospital on 9/16 which can make the viral lesions worse Patient on  valtrex  1 gram PO TID - tolerated the dose from yesterday - may crush and mix with apple sauce or may do syrup if she does not tolerate it  total of 10 days  until 9/28 and then reduce to 500mg  every day for a long period  Vomiting and abdominal pain due to the UC Going for EGD  Ulcerative colitis on steroids- if she is going to continue prednisone  at 40mg  or more for a proloned period she will need PCP prophylaxis with bactrim Will let GI /promary team decide on that Was on JAK inhibitor ( not anymore) Also on TNFI  Discussed the management with patient , hospitalist, GI  and ID pharmacist ID will sign off -call if needed

## 2024-08-14 NOTE — Anesthesia Postprocedure Evaluation (Signed)
 Anesthesia Post Note  Patient: Nancy Eaton  Procedure(s) Performed: EGD (ESOPHAGOGASTRODUODENOSCOPY)     Patient location during evaluation: Endoscopy Anesthesia Type: MAC Level of consciousness: oriented, awake and alert and awake Pain management: pain level controlled Vital Signs Assessment: post-procedure vital signs reviewed and stable Respiratory status: spontaneous breathing, nonlabored ventilation, respiratory function stable and patient connected to nasal cannula oxygen Cardiovascular status: blood pressure returned to baseline and stable Postop Assessment: no headache, no backache and no apparent nausea or vomiting Anesthetic complications: no   There were no known notable events for this encounter.  Last Vitals:  Vitals:   08/14/24 1520 08/14/24 1530  BP: (!) 108/59 108/61  Pulse:    Resp: 20 18  Temp:    SpO2: 96% 95%    Last Pain:  Vitals:   08/14/24 1530  TempSrc:   PainSc: 0-No pain                 Garnette FORBES Skillern

## 2024-08-14 NOTE — Plan of Care (Signed)
  Problem: Education: Goal: Knowledge of General Education information will improve Description: Including pain rating scale, medication(s)/side effects and non-pharmacologic comfort measures Outcome: Progressing   Problem: Health Behavior/Discharge Planning: Goal: Ability to manage health-related needs will improve Outcome: Progressing   Problem: Activity: Goal: Risk for activity intolerance will decrease Outcome: Progressing   Problem: Coping: Goal: Level of anxiety will decrease Outcome: Progressing   Problem: Elimination: Goal: Will not experience complications related to bowel motility Outcome: Progressing   Problem: Pain Managment: Goal: General experience of comfort will improve and/or be controlled Outcome: Progressing

## 2024-08-14 NOTE — Anesthesia Preprocedure Evaluation (Signed)
 Anesthesia Evaluation  Patient identified by MRN, date of birth, ID band Patient awake    Reviewed: Allergy & Precautions, NPO status , Patient's Chart, lab work & pertinent test results  History of Anesthesia Complications (+) PONV and history of anesthetic complications  Airway Mallampati: II  TM Distance: >3 FB Neck ROM: Full    Dental  (+) Teeth Intact, Dental Advisory Given   Pulmonary neg pulmonary ROS   Pulmonary exam normal breath sounds clear to auscultation       Cardiovascular hypertension, Pt. on medications Normal cardiovascular exam Rhythm:Regular Rate:Normal     Neuro/Psych negative neurological ROS     GI/Hepatic Neg liver ROS, PUD,GERD  Medicated,,Nausea and vomiting UC   Endo/Other  diabetes, Type 2    Renal/GU negative Renal ROS     Musculoskeletal  (+) Arthritis ,    Abdominal   Peds  Hematology  (+) Blood dyscrasia, anemia   Anesthesia Other Findings Day of surgery medications reviewed with the patient.  Reproductive/Obstetrics                              Anesthesia Physical Anesthesia Plan  ASA: 3  Anesthesia Plan: MAC   Post-op Pain Management: Minimal or no pain anticipated   Induction: Intravenous  PONV Risk Score and Plan: 3 and TIVA and Treatment may vary due to age or medical condition  Airway Management Planned: Natural Airway and Simple Face Mask  Additional Equipment:   Intra-op Plan:   Post-operative Plan:   Informed Consent: I have reviewed the patients History and Physical, chart, labs and discussed the procedure including the risks, benefits and alternatives for the proposed anesthesia with the patient or authorized representative who has indicated his/her understanding and acceptance.     Dental advisory given  Plan Discussed with: CRNA  Anesthesia Plan Comments:          Anesthesia Quick Evaluation

## 2024-08-14 NOTE — Progress Notes (Signed)
 [facility]                                                                              Nancy Eaton, is a 68 y.o. female, DOB - 02/19/1956, FMW:[id number] Admit date - 08/05/2024    Outpatient Primary MD for the patient is Drosinis, Arland PARAS, PA-C  LOS - 9  days  Chief Complaint  Patient presents with   Abdominal Pain       Brief summary   Patient is a 68 y.o. female with medical history significant for prediabetes, ulcerative colitis, hypertension, hyperlipidemia recently discharged from [facility] after ulcerative colitis flare on 07/19/24 now returns with dehydration, abdominal cramping and vomiting.  Found to have another ulcerative colitis flare and initially there was concern that her symptoms were related to acyclovir .  Active herpes zoster and was placed on IV acyclovir . She received a dose of Inflectra  and Rinvoq  has been discontinued and her Steroids have been changed from IV to po.   ID, GI following.  Assessment & Plan    Nausea and vomiting in the setting of ulcerative colitis flare:   - presented with abdominal discomfort, loose stools, Nausea and vomiting. Initially thought to be in setting of valacyclovir  but likely UC flare.  - Started on soft diet however still having abdominal symptoms, CT abdomen repeated, findings most likely infective/inflammatory colitis -GI Dr. Rollin following, controlled with infliximab  and maintained on prednisone  40 mg every day - plan for EGD, trial of Levsin  every 4 hours as needed, maintain prednisone   Intergluteal and buttock lesions, HSV 2 - ID following, per ID, HSV 2 DNA came back positive, waiting for VZV DNA on the same swab but doubt she will have both. - Now on higher dose Valtrex  1000 mg 3 times daily then 500 mg daily starting 9/27 indefinitely  as suppressive because of UC     Hyponatremia: - Likely hypovolemic hyponatremia. Improved with IV hydration.   Hypophosphatemia:  Resolved     Ulcerative  colitis: -She was admitted to the hospital with recent flare, improved with IV steroids and initiation of Rinvoq .   -Continue Prednisone  40 mg p.o. daily -Rinvoq  changed to Infliximab  by GI as she receives this 10 mg/kg infusion at 10 AM on 9/16 with improvement in her symptoms.  She was approved for 10 mg/kg infliximab  as an outpatient . Plan is to place her on this medication every 4 weeks per GI and they are recommending a repeat trough and antibody level to be obtained.  She is also amenable to start AZA at this time and will be initiated on this in the outpatient setting -Started trial of Levsin  for spasms    Essential Hypertension:  BP controlled   Type 2 Diabetes Mellitus, uncontrolled with hypoglycemia:  HbA1c 6.7, diet controlled.  CBG (last 3)  Recent Labs    08/13/24 2211 08/14/24 0829 08/14/24 1223  GLUCAP 191* 79 100*   Continue very sensitive SSI while on prednisone     Chronic Iron  Deficiency Anemia:  H&H relatively stable. No signs of any active bleeding.   Follow H&H   Chronic Thrombocytosis: Stable. Resolved.   GERD/GI Prophylaxis:  Continue pantoprazole  40 mg daily.     Estimated body mass index is 29.8 kg/m as calculated from the following:   Height as of this encounter: 5' 2.5 (1.588 m).   Weight as of this encounter: 75.1 kg.  Code Status: Full code DVT Prophylaxis:  enoxaparin  (LOVENOX ) injection 40 mg Start: 08/05/24 2200   Level of Care: Level of care: Med-Surg Family Communication: Updated patient Disposition Plan:      Remains inpatient appropriate:      Procedures:    Consultants:   Gastroenterology, ID  Antimicrobials:   Anti-infectives (From admission, onward)    Start     Dose/Rate Route Frequency Ordered Stop   08/17/24 1000  valACYclovir  (VALTREX ) tablet 500 mg  Status:  Discontinued        500 mg Oral 2 times daily 08/13/24 1535 08/13/24 1551   08/17/24 1000  valACYclovir  (VALTREX ) tablet 500 mg        500 mg Oral Daily  08/13/24 1551     08/13/24 2200  valACYclovir  (VALTREX ) tablet 1,000 mg        1,000 mg Oral 3 times daily 08/13/24 1447 08/16/24 2359   08/07/24 1400  acyclovir  (ZOVIRAX ) 750 mg in dextrose  5 % 250 mL IVPB  Status:  Discontinued        10 mg/kg  75.1 kg 265 mL/hr over 60 Minutes Intravenous Every 8 hours 08/07/24 1141 08/13/24 1447   08/07/24 1000  acyclovir  (ZOVIRAX ) tablet 800 mg  Status:  Discontinued        800 mg Oral 5 times daily 08/07/24 0831 08/07/24 0839          Medications  atorvastatin   40 mg Oral QHS   enoxaparin  (LOVENOX ) injection  40 mg Subcutaneous Q24H   feeding supplement (GLUCERNA SHAKE)  237 mL Oral TID BM   ferrous sulfate   325 mg Oral QODAY   insulin  aspart  0-6 Units Subcutaneous TID WC   pantoprazole   40 mg Oral Daily   predniSONE   40 mg Oral Q breakfast   valACYclovir   1,000 mg Oral TID   [START ON 08/17/2024] valACYclovir   500 mg Oral Daily      Subjective:   Nancy Eaton was seen and examined today.  States Levsin  helped with abdominal spasms.  No acute nausea vomiting, chest pain, fevers.  Objective:   Vitals:   08/13/24 0544 08/13/24 1351 08/13/24 2058 08/14/24 0725  BP: 139/73 130/73 (!) 154/85 139/73  Pulse: 80 79 72 72  Resp: 18 16 18 18   Temp: 97.8 F (36.6 C) 98.5 F (36.9 C) 98.3 F (36.8 C) 98.1 F (36.7 C)  TempSrc: Oral Oral Oral Oral  SpO2: 97% 96% 97% 98%  Weight:      Height:        Intake/Output Summary (Last 24 hours) at 08/14/2024 1256 Last data filed at 08/13/2024 1700 Gross per 24 hour  Intake 2141.8 ml  Output --  Net 2141.8 ml     Wt Readings from Last 3 Encounters:  08/06/24 75.1 kg  07/19/24 77.8 kg  06/23/24 84 kg     Exam General: Alert and oriented x 3, NAD Cardiovascular: S1 S2 auscultated,  RRR Respiratory: Clear to auscultation bilaterally, no wheezing Gastrointestinal: Soft, nontender, nondistended, + bowel sounds Ext: no pedal edema bilaterally Neuro: No new deficits Skin: Intergluteal  and buttock rash Psych: Normal affect     Data Reviewed:  I have personally reviewed following labs    CBC Lab Results  Component  Value Date   WBC 11.7 (H) 08/14/2024   RBC 3.10 (L) 08/14/2024   HGB 8.0 (L) 08/14/2024   HCT 26.4 (L) 08/14/2024   MCV 85.2 08/14/2024   MCH 25.8 (L) 08/14/2024   PLT 398 08/14/2024   MCHC 30.3 08/14/2024   RDW 17.9 (H) 08/14/2024   LYMPHSABS 2.7 08/12/2024   MONOABS 1.6 (H) 08/12/2024   EOSABS 0.0 08/12/2024   BASOSABS 0.0 08/12/2024     Last metabolic panel Lab Results  Component Value Date   NA 136 08/14/2024   K 4.2 08/14/2024   CL 99 08/14/2024   CO2 27 08/14/2024   BUN 10 08/14/2024   CREATININE 0.50 08/14/2024   GLUCOSE 104 (H) 08/14/2024   GFRNONAA >60 08/14/2024   GFRAA >60 07/18/2020   CALCIUM  8.5 (L) 08/14/2024   PHOS 2.6 08/13/2024   PROT 5.2 (L) 08/12/2024   ALBUMIN 2.7 (L) 08/12/2024   BILITOT 0.3 08/12/2024   ALKPHOS 70 08/12/2024   AST <10 (L) 08/12/2024   ALT 16 08/12/2024   ANIONGAP 10 08/14/2024    CBG (last 3)  Recent Labs    08/13/24 2211 08/14/24 0829 08/14/24 1223  GLUCAP 191* 79 100*      Coagulation Profile: No results for input(s): INR, PROTIME in the last 168 hours.   Radiology Studies: I have personally reviewed the imaging studies  No results found.     Nydia Distance M.D. [facility] 08/14/2024, 12:56 PM  Available via Epic secure chat 7am-7pm After 7 pm, please refer to night coverage provider listed on amion.

## 2024-08-14 NOTE — Interval H&P Note (Signed)
 History and Physical Interval Note:  08/14/2024 2:59 PM  Nancy Eaton  has presented today for surgery, with the diagnosis of Nausea and vomiting.  The various methods of treatment have been discussed with the patient and family. After consideration of risks, benefits and other options for treatment, the patient has consented to  Procedure(s): EGD (ESOPHAGOGASTRODUODENOSCOPY) (N/A) as a surgical intervention.  The patient's history has been reviewed, patient examined, no change in status, stable for surgery.  I have reviewed the patient's chart and labs.  Questions were answered to the patient's satisfaction.     [name]

## 2024-08-14 NOTE — Op Note (Signed)
 [facility] Patient Name: Nancy Eaton Procedure Date: 08/14/2024 MRN: [id number] Attending MD: [name] , MD, [id number] Date of Birth: 1956/07/09 CSN: [id number] Age: 68 Admit Type: Inpatient Procedure:                Diagnostic EGD. Indications:              Iron  deficiency anemia, Gastro-esophageal reflux                            disease, Nausea. Providers:                [name], MD, Randall Lines, RN, Haskel Chris,                            Technician, Linzy Feeling, CRNA Referring MD:             Arland PARAS Drosinis, PA-C Medicines:                Monitored Anesthesia Care Complications:            No immediate complications. Estimated Blood Loss:     Estimated blood loss: none. Procedure:                Pre-Anesthesia Assessment: - Prior to the                            procedure, a history and physical was performed,                            and patient medications and allergies were                            reviewed. The patient's tolerance of previous                            anesthesia was also reviewed. The risks and                            benefits of the procedure and the sedation options                            and risks were discussed with the patient. All                            questions were answered, and informed consent was                            obtained. Prior Anticoagulants: The patient has                            taken no anticoagulant or antiplatelet agents. ASA                            Grade Assessment: III - A patient with severe  systemic disease. After reviewing the risks and                            benefits, the patient was deemed in satisfactory                            condition to undergo the procedure. After obtaining                            informed consent, the endoscope was passed under                            direct vision. Throughout the procedure, the                             patient's blood pressure, pulse, and oxygen                            saturations were monitored continuously. The                            [id number]-[id number] ([id number]) Olympus endoscope was introduced                            through the mouth, and advanced to the second part                            of duodenum. The EGD was accomplished without                            difficulty. The patient tolerated the procedure                            well. Scope In: Scope Out: Findings:      The examined esophagus and GEJ appeared widely patent and normal.      The entire examined stomach was normal.      The cardia and gastric fundus were normal on retroflexion.      The examined duodenum was normal. Impression:               - Normal appearing, widely patent esophagus and GEJ.                           - Normal appearung stomach.                           - Normal examined duodenum.                           - No specimens collected. Moderate Sedation:      MAC used. Recommendation:           - Mechanical soft diet today.                           - Continue present medications. Procedure Code(s):        ---  Professional ---                           [phone], Esophagogastroduodenoscopy, flexible,                            transoral; diagnostic, including collection of                            specimen(s) by brushing or washing, when performed                            (separate procedure) Diagnosis Code(s):        --- Professional ---                           D50.9, Iron  deficiency anemia, unspecified                           K21.9, Gastro-esophageal reflux disease without                            esophagitis                           R11.0, Nausea CPT copyright 2022 [employer] Association. All rights reserved. The codes documented in this report are preliminary and upon coder review may  be revised to meet current compliance requirements. [name],  MD [name], MD 08/14/2024 3:16:39 PM This report has been signed electronically. Number of Addenda: 0

## 2024-08-15 ENCOUNTER — Other Ambulatory Visit (HOSPITAL_COMMUNITY): Payer: Self-pay

## 2024-08-15 ENCOUNTER — Encounter (HOSPITAL_COMMUNITY): Payer: Self-pay | Admitting: Gastroenterology

## 2024-08-15 DIAGNOSIS — E871 Hypo-osmolality and hyponatremia: Secondary | ICD-10-CM | POA: Diagnosis not present

## 2024-08-15 DIAGNOSIS — E860 Dehydration: Secondary | ICD-10-CM | POA: Diagnosis not present

## 2024-08-15 DIAGNOSIS — R1110 Vomiting, unspecified: Secondary | ICD-10-CM | POA: Diagnosis not present

## 2024-08-15 DIAGNOSIS — R1084 Generalized abdominal pain: Secondary | ICD-10-CM | POA: Diagnosis not present

## 2024-08-15 LAB — CREATININE, SERUM
Creatinine, Ser: 0.5 mg/dL (ref 0.44–1.00)
GFR, Estimated: >60 mL/min (ref >60)

## 2024-08-15 LAB — GLUCOSE, CAPILLARY
Glucose-Capillary: 75 mg/dL (ref 70–99)
Glucose-Capillary: 175 mg/dL — ABNORMAL HIGH (ref 70–99)

## 2024-08-15 MED ORDER — VALACYCLOVIR HCL 500 MG PO TABS
500.0000 mg | ORAL_TABLET | Freq: Every day | ORAL | 3 refills | Status: AC
  Filled 2024-08-15: qty 90, 90d supply, fill #0
  Filled 2024-11-12: qty 90, 90d supply, fill #1

## 2024-08-15 MED ORDER — HYOSCYAMINE SULFATE 0.125 MG SL SUBL
0.1250 mg | SUBLINGUAL_TABLET | SUBLINGUAL | 3 refills | Status: AC | PRN
  Filled 2024-08-15: qty 60, 10d supply, fill #0

## 2024-08-15 MED ORDER — ONDANSETRON 4 MG PO TBDP
4.0000 mg | ORAL_TABLET | Freq: Three times a day (TID) | ORAL | 1 refills | Status: AC | PRN
  Filled 2024-08-15: qty 30, 10d supply, fill #0

## 2024-08-15 MED ORDER — VALACYCLOVIR HCL 1 G PO TABS
1000.0000 mg | ORAL_TABLET | Freq: Three times a day (TID) | ORAL | 0 refills | Status: AC
  Filled 2024-08-15: qty 12, 4d supply, fill #0

## 2024-08-15 MED ORDER — PREDNISONE 10 MG PO TABS
30.0000 mg | ORAL_TABLET | Freq: Every day | ORAL | 1 refills | Status: AC
  Filled 2024-08-15: qty 90, 30d supply, fill #0

## 2024-08-15 NOTE — Care Management Important Message (Signed)
 Important Message  Patient Details IM Letter given. Name: Nancy Eaton MRN: [id number] Date of Birth: [birth year]   Important Message Given:  Yes - Medicare IM     Nancy Eaton 08/15/2024, 10:10 AM

## 2024-08-15 NOTE — Progress Notes (Signed)
 Subjective: She feels ready to go home.  Objective: Vital signs in last 24 hours: Temp:  [98 F (36.7 C)-99.9 F (37.7 C)] 98.1 F (36.7 C) (09/25 0615) Pulse Rate:  [70-82] 74 (09/25 0615) Resp:  [16-20] 16 (09/24 1549) BP: (99-139)/(56-79) 138/79 (09/25 0615) SpO2:  [95 %-100 %] 96 % (09/25 0615) Weight:  [75.1 kg] 75.1 kg (09/24 1441) Last BM Date : 08/14/24  Intake/Output from previous day: 09/24 0701 - 09/25 0700 In: 460 [P.O.:360; I.V.:100] Out: 0  Intake/Output this shift: Total I/O In: 360 [P.O.:360] Out: -   General appearance: alert and no distress GI: soft, non-tender; bowel sounds normal; no masses,  no organomegaly  Lab Results: Recent Labs    08/13/24 0356 08/14/24 0358  WBC 12.6* 11.7*  HGB 7.6* 8.0*  HCT 25.3* 26.4*  PLT 384 398   BMET Recent Labs    08/13/24 0356 08/14/24 0358 08/15/24 0347  NA 134* 136  --   K 3.7 4.2  --   CL 99 99  --   CO2 25 27  --   GLUCOSE 54* 104*  --   BUN 7* 10  --   CREATININE 0.48 0.50 0.50  CALCIUM  8.3* 8.5*  --    LFT No results for input(s): PROT, ALBUMIN, AST, ALT, ALKPHOS, BILITOT, BILIDIR, IBILI in the last 72 hours. PT/INR No results for input(s): LABPROT, INR in the last 72 hours. Hepatitis Panel No results for input(s): HEPBSAG, HCVAB, HEPAIGM, HEPBIGM in the last 72 hours. C-Diff No results for input(s): CDIFFTOX in the last 72 hours. Fecal Lactopherrin No results for input(s): FECLLACTOFRN in the last 72 hours.  Studies/Results: No results found.  Medications: Scheduled:  atorvastatin   40 mg Oral QHS   enoxaparin  (LOVENOX ) injection  40 mg Subcutaneous Q24H   feeding supplement (GLUCERNA SHAKE)  237 mL Oral TID BM   ferrous sulfate   325 mg Oral QODAY   insulin  aspart  0-6 Units Subcutaneous TID WC   pantoprazole   40 mg Oral Daily   predniSONE   40 mg Oral Q breakfast   valACYclovir   1,000 mg Oral TID   [START ON 08/17/2024] valACYclovir   500 mg Oral Daily    Continuous:  Assessment/Plan: 1) Left sided UC s/p infliximab  and on prednisone  40 mg every day. 2) Lower abdominal cramping - improved with Levsin . 3) HSV at the gluteal cleft.   She feels ready to go home.  The Levsin  is helping with her cramping and she also will avoid eating late in the evening.  The EGD yesterday was unrevealing for any pathology.  Plan: 1) Okay to D/C home. 2) Follow up in the office next Wednesday. 3) D/C with Levsin . 4) She was advised to taper her prednisone  to 30 mg every day when she is discharged.  LOS: 10 days   Harbour Nordmeyer D 08/15/2024, 6:58 AM

## 2024-08-15 NOTE — Progress Notes (Signed)
 Discharge instructions given to patient questions asked and answered.Discharge medications delivered to patient at bedside in a secure bag.

## 2024-08-15 NOTE — Progress Notes (Signed)
 PT Cancellation Note  Patient Details Name: Nancy Eaton MRN: [id number] DOB: 1956-04-21   Cancelled Treatment:    Reason Eval/Treat Not Completed: PT screened, no needs identified, will sign off Patient to Dc, is independent.  Darice Potters PT Acute Rehabilitation Services Office [phone]  Potters Darice Norris 08/15/2024, 12:44 PM

## 2024-08-15 NOTE — Discharge Summary (Signed)
 Physician Discharge Summary   Patient: Nancy Eaton MRN: [id number] DOB: 1956-10-01  Admit date:     08/05/2024  Discharge date: 08/15/24  Discharge Physician: Nydia Distance, MD    PCP: Christopher Arland PARAS, PA-C   Recommendations at discharge:   Prednisone  decreased to 30 mg daily as per GI recommendations Continue Valtrex  1 g 3 times daily until 9/28 and then continue 500 mg p.o. 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 female with medical history significant for prediabetes, ulcerative colitis, hypertension, hyperlipidemia recently discharged from [facility] after ulcerative colitis flare on 07/19/24 now returns with dehydration, abdominal cramping and vomiting.  Found to have another ulcerative colitis flare and initially there was concern that her symptoms were related to acyclovir .  Active herpes zoster and was placed on IV acyclovir . She received a dose of Inflectra  and Rinvoq  has been discontinued and her Steroids have been changed from IV to po.   ID, GI was consulted   Assessment and Plan:  Nausea and vomiting in the setting of ulcerative colitis flare:   - presented with abdominal discomfort, loose stools, Nausea and vomiting. Initially thought to be in setting of valacyclovir  but likely UC flare.  - Started on soft diet however still having abdominal symptoms, CT abdomen repeated, findings most likely infective/inflammatory colitis -GI Dr. Rollin following, controlled with infliximab  and prednisone  40 mg every day.  Per GI, discharge on tapered dose of prednisone  30 mg daily - Underwent EGD, essentially normal esophagus, stomach, duodenum.   - Tolerating diet, Levsin  every 4 hours as needed, continue prednisone   30 mg daily.  Outpatient follow-up with Dr. Lucinda   Intergluteal and buttock lesions, HSV 2 - ID following, per ID, HSV 2 DNA came back positive, VZV DNA negative  - Now on higher dose Valtrex  1000 mg 3 times daily until 9/28, then then 500 mg daily  indefinitely as suppressive Rx because of UC      Hyponatremia: - Likely hypovolemic hyponatremia. Improved with IV hydration.   Hypophosphatemia:  Resolved     Ulcerative colitis: -She was admitted to the hospital with recent flare, improved with IV steroids and initiation of Rinvoq .   -Continue Prednisone  40 mg p.o. daily -Rinvoq  changed to Infliximab  by GI as she receives this 10 mg/kg infusion at 10 AM on 9/16 with improvement in her symptoms.  She was approved for 10 mg/kg infliximab  as an outpatient . Plan is to place her on this medication every 4 weeks per GI and they are recommending a repeat trough and antibody level to be obtained.  She is also amenable to start AZA at this time and will be initiated on this in the outpatient setting -Started trial of Levsin  for spasms -Outpatient follow-up with GI     Essential Hypertension:  BP controlled   Type 2 Diabetes Mellitus, uncontrolled with hypoglycemia:  HbA1c 6.7, diet controlled.      Chronic Iron  Deficiency Anemia:  No signs of any active bleeding.   Hemoglobin 8.0 on discharge   Chronic Thrombocytosis:  Stable. Resolved.  Platelet count 398K on discharge   GERD/GI Prophylaxis:  Continue pantoprazole  40 mg daily.       Estimated body mass index is 29.8 kg/m as calculated from the following:   Height as of  this encounter: 5' 2.5 (1.588 m).   Weight as of this encounter: 75.1 kg.        Pain control - [place]  Controlled Substance Reporting System database was reviewed. and patient was instructed, not to drive, operate heavy machinery, perform activities at heights, swimming or participation in water activities or provide baby-sitting services while on  Pain, Sleep and Anxiety Medications; until their outpatient Physician has advised to do so again. Also recommended to not to take more than prescribed Pain, Sleep and Anxiety Medications.  Consultants: Gastroenterology, infectious disease Procedures performed: EGD Disposition: Home Diet recommendation:  Discharge Diet Orders (From admission, onward)     Start     Ordered   08/15/24 0000  Diet - low sodium heart healthy        08/15/24 0738            DISCHARGE MEDICATION: Allergies as of 08/15/2024       Reactions   Azithromycin  Nausea And Vomiting, Other (See Comments)   Stomach pain, also   Clindamycin /lincomycin Nausea And Vomiting, Other (See Comments)   Stomach pain, also (Clindamycin )   Humira (1 Pen) [adalimumab] Rash   Penicillins Rash, Other (See Comments)   Happened 20 years ago Did it involve sudden or severe rash/hives, skin peeling, or any reaction on the inside of your mouth or nose? Yes     Rinvoq  [upadacitinib ] Rash        Medication List     PAUSE taking these medications    losartan  50 MG tablet Wait to take this until your doctor or other care provider tells you to start again. Commonly known as: COZAAR  Take 25 mg by mouth in the morning.       STOP taking these medications    oxyCODONE  5 MG immediate release tablet Commonly known as: Roxicodone        TAKE these medications    acetaminophen  500 MG tablet Commonly known as: TYLENOL  Take 1,000 mg by mouth every 6 (six) hours as needed (for pain).   atorvastatin  40 MG tablet Commonly known as: LIPITOR Take 1 tablet (40 mg total) by mouth daily. What changed: when to take this   feeding supplement (GLUCERNA SHAKE) Liqd Take 237 mLs by mouth 3 (three) times daily between meals.   ferrous sulfate  325 (65 FE) MG tablet Take 325 mg by mouth See admin instructions. Take 325 mg by mouth every other morning with breakfast   FISH OIL PO Take 1,400 mg by mouth daily.   hyoscyamine  0.125  MG SL tablet Commonly known as: LEVSIN  SL Place 1 tablet (0.125 mg total) under the tongue every 4 (four) hours as needed for cramping.   Inflectra  100 MG Solr Generic drug: inFLIXimab -dyyb Inject 100 mg into the vein every 2 (two) months.   melatonin 5 MG Tabs Take 5 mg by mouth at bedtime as needed (for sleep).   ondansetron  4 MG disintegrating tablet Commonly known as: ZOFRAN -ODT Dissolve 1 tablet (4 mg total) in mouth every 8 (eight) hours as needed for nausea or vomiting   ONE-A-DAY WOMENS 50+ PO Take 1 tablet by mouth daily with breakfast.   pantoprazole  40 MG tablet Commonly known as: PROTONIX  Take 1 tablet (40 mg total) by mouth daily. What changed: when to take this   predniSONE  10 MG tablet Commonly known as: DELTASONE  Take 3 tablets (30 mg total) by mouth daily with breakfast. What changed:  medication strength how much to take   sodium chloride  0.9 % Soln  See admin instructions. As directed- in conjunction with Inflectra  every 2 months   Systane Complete 0.6 % Soln Generic drug: Propylene Glycol Place 1 drop into both eyes 3 (three) times daily as needed (for dryness).   valACYclovir  1000 MG tablet Commonly known as: VALTREX  Take 1 tablet (1,000 mg total) by mouth 3 (three) times daily for 4 days.   valACYclovir  500 MG tablet Commonly known as: VALTREX  Take 1 tablet (500 mg total) by mouth daily. Start taking on: August 19, 2024   vitamin C  1000 MG tablet Take 1,000 mg by mouth every other day. What changed: Another medication with the same name was removed. Continue taking this medication, and follow the directions you see here.               Discharge Care Instructions  (From admission, onward)           Start     Ordered   08/15/24 0000  Discharge wound care:       Comments: Cleanse buttocks/coccyx rash with soap and water, dry and cover with ABD pad and clothe tape. Would avoid silicone foam as this may hold moisture onto area and  prevent drying.   08/15/24 9261            Follow-up Information     Drosinis, Arland PARAS, PA-C. Schedule an appointment as soon as possible for a visit in 2 week(s).   Specialty: Internal Medicine Why: for hospital follow-up Contact information: [address] [place] [address] [phone]         Rollin Dover, MD. Schedule an appointment as soon as possible for a visit in 1 week(s).   Specialty: Gastroenterology Why: for hospital follow-up Contact information: [address] [place] [address] [phone]                Discharge Exam: Fredricka Weights   08/06/24 0857 08/14/24 1441  Weight: 75.1 kg 75.1 kg   S: No acute complaints, ambulating in the room, ready to go home today.  Cleared by GI this morning.  BP 138/79 (BP Location: Left Arm)   Pulse 74   Temp 98.1 F (36.7 C) (Oral)   Resp 16   Ht 5' 2.5 (1.588 m)   Wt 75.1 kg   SpO2 96%   BMI 29.80 kg/m   Physical Exam General: Alert and oriented x 3, NAD Cardiovascular: S1 S2 clear, RRR.  Respiratory: CTAB, no wheezing, rales or rhonchi Gastrointestinal: Soft, nontender, nondistended, NBS Ext: no pedal edema bilaterally Neuro: no new deficits Psych: Normal affect    Condition at discharge: fair  The results of significant diagnostics from this hospitalization (including imaging, microbiology, ancillary and laboratory) are listed below for reference.   Imaging Studies: CT ABDOMEN PELVIS W CONTRAST Result Date: 08/11/2024 CLINICAL DATA:  Abdominal pain, acute, nonlocalized EXAM: CT ABDOMEN AND PELVIS WITH CONTRAST TECHNIQUE: Multidetector CT imaging of the abdomen and pelvis was performed using the standard protocol following bolus administration of intravenous contrast. RADIATION DOSE REDUCTION: This exam was performed according to the departmental dose-optimization program which includes automated exposure control, adjustment of the mA and/or kV according to patient size  and/or use of iterative reconstruction technique. CONTRAST:  OMNIPAQUE  IOHEXOL  300 MG/ML  SOLN COMPARISON:  CT scan abdomen and pelvis from 07/10/2024. FINDINGS: Lower chest: There are subsegmental atelectatic changes in the left lung base. There also additional patchy areas of scarring/atelectasis in the imaged bilateral lungs. No overt consolidation. No pleural effusion.  The heart is normal in size. No pericardial effusion. Bilateral breast implants noted. There is there is right-sided intracapsular implant rupture. Hepatobiliary: The liver is normal in size. Non-cirrhotic configuration. No suspicious liver mass. Redemonstration of irregular marginated hypoattenuating area in the right hepatic lobe, segment 5/6 measuring 2.5 x 2.5 cm. No intrahepatic or extrahepatic bile duct dilation. No calcified gallstones. Normal gallbladder wall thickness. No pericholecystic inflammatory changes. Pancreas: Unremarkable. No pancreatic ductal dilatation or surrounding inflammatory changes. Spleen: Within normal limits. No focal lesion. Adrenals/Urinary Tract: There is stable subcentimeter sized left adrenal adenoma. Unremarkable right adrenal gland. No suspicious renal mass. There several simple cysts in the left kidney with largest measuring up to 7 x 12 mm. No nephroureterolithiasis or obstructive uropathy. Unremarkable urinary bladder. Stomach/Bowel: No disproportionate dilation of the small or large bowel loops. The appendix is unremarkable. Redemonstration of mild-to-moderate circumferential thickening of the descending colon and proximal sigmoid colon with associated mild-to-moderate pericolonic fat stranding, prominence of vasa recta and small subcentimeter sized pericolonic lymph nodes. Findings are essentially similar in extent and severity when compared to the prior exam from 07/10/2024 and compatible with colitis, most likely infective/inflammatory in etiology. There is no associated pneumatosis,  pneumoperitoneum or portal venous gas. There is moderate stool burden including a 6.1 x 6.6 cm fecaloma in the right paramedian transverse colon (series 2, image 42). Overall less stool burden when compared to the recent prior exam. Vascular/Lymphatic: There is trace ascites mainly in the paracolic gutters. No walled-off abscess. No pneumoperitoneum. No abdominal or pelvic lymphadenopathy, by size criteria. No aneurysmal dilation of the major abdominal arteries. There are mild peripheral atherosclerotic vascular calcifications of the aorta and its major branches. Reproductive: The uterus is unremarkable. No large adnexal mass. Other: Supraumbilical midline surgical scar noted. There is a tiny fat containing periumbilical hernia. The soft tissues and abdominal wall are otherwise unremarkable. Musculoskeletal: No suspicious osseous lesions. There are mild multilevel degenerative changes in the visualized spine. IMPRESSION: 1. Redemonstration of mild-to-moderate circumferential thickening of the descending colon and proximal sigmoid colon with associated mild-to-moderate pericolonic fat stranding, prominence of vasa recta and small subcentimeter sized pericolonic lymph nodes. Findings are essentially similar in extent and severity when compared to the prior exam from 07/10/2024 and compatible with colitis, most likely infective/inflammatory in etiology. No associated pneumatosis, pneumoperitoneum or portal venous gas. There is trace ascites, which is reactive. No walled-off abscess or loculated collection. 2. Multiple other nonacute observations, as described above. Aortic Atherosclerosis (ICD10-I70.0). Electronically Signed   By: Ree Molt M.D.   On: 08/11/2024 11:16    Microbiology: Results for orders placed or performed during the hospital encounter of 08/05/24  Varicella-zoster by PCR     Status: None   Collection Time: 08/07/24  3:03 PM   Specimen: Skin, Other; Sterile Swab  Result Value Ref Range  Status   Varicella-Zoster, PCR Negative Negative Final    Comment: (NOTE) No Varicella Zoster Virus DNA detected. This test was developed and its performance characteristics determined by [facility]. It has not been cleared or approved by the Food and Drug Administration. Performed At: [facility] [address] [place], [place] [id number] Jennette Shorter MD Ey:[id number]   Hsv Culture And Typing     Status: None   Collection Time: 08/07/24  3:03 PM   Specimen: Skin, Other  Result Value Ref Range Status   HSV Culture/Type Comment  Final    Comment: (NOTE) Negative No Herpes simplex virus isolated. Performed At: [facility] [employer] [address]  [address] [place], [place] [id number] Jennette Shorter MD Ey:[id number]    Source of Sample NASOPHARYNGEAL SWAB  Final    Comment: Performed at [facility], [address] [place], [place] [address]  Varicella-zoster by PCR     Status: None   Collection Time: 08/13/24  3:47 PM   Specimen: Ulcer; Sterile Swab  Result Value Ref Range Status   Varicella-Zoster, PCR Negative Negative Final    Comment: (NOTE) No Varicella Zoster Virus DNA detected. This test was developed and its performance characteristics determined by [facility]. It has not been cleared or approved by the Food and Drug Administration. Performed At: [facility] [address] [place], [place] [id number] Jennette Shorter MD Ey:[id number]     Labs: CBC: Recent Labs  Lab 08/09/24 0345 08/10/24 0329 08/11/24 0409 08/12/24 0314 08/13/24 0356 08/14/24 0358  WBC 10.5 15.1* 23.0* 17.6* 12.6* 11.7*  NEUTROABS 6.4 9.4* 17.2* 12.9*  --   --   HGB 7.7* 7.6* 7.9* 8.2* 7.6* 8.0*  HCT 25.2* 24.4* 25.1* 26.1* 25.3* 26.4*  MCV 83.2 83.8 83.9 83.9 85.2 85.2  PLT 434* 432* 430* 380 384 398   Basic Metabolic Panel: Recent Labs  Lab 08/09/24 0345 08/10/24 0329 08/11/24 0409 08/12/24 0314 08/13/24 0356 08/14/24 0358 08/15/24 0347  NA 136 135 136  135 134* 136  --   K 4.1 4.3 3.6 3.7 3.7 4.2  --   CL 100 100 100 98 99 99  --   CO2 25 27 25 26 25 27   --   GLUCOSE 109* 106* 85 71 54* 104*  --   BUN 9 13 10  7* 7* 10  --   CREATININE 0.49 0.73 0.50 0.48 0.48 0.50 0.50  CALCIUM  8.4* 8.5* 8.2* 8.8* 8.3* 8.5*  --   MG 2.5* 2.3 2.2 2.4 2.4  --   --   PHOS 2.4* 2.1* 3.2 3.8 2.6  --   --    Liver Function Tests: Recent Labs  Lab 08/09/24 0345 08/10/24 0329 08/11/24 0409 08/12/24 0314  AST <10* 13* 12* <10*  ALT 15 16 16 16   ALKPHOS 74 76 68 70  BILITOT <0.2 <0.2 0.3 0.3  PROT 4.9* 4.9* 4.7* 5.2*  ALBUMIN 2.6* 2.7* 2.6* 2.7*   CBG: Recent Labs  Lab 08/14/24 1555 08/14/24 1725 08/14/24 2222 08/15/24 0729 08/15/24 1113  GLUCAP 169* 189* 198* 75 175*    Discharge time spent: greater than 30 minutes.  Signed: Nydia Distance, MD [facility] 08/15/2024

## 2024-10-22 ENCOUNTER — Other Ambulatory Visit: Payer: Self-pay
